# Patient Record
Sex: Male | Born: 1950
Health system: Southern US, Community
[De-identification: ages and names within clinical notes are randomized; demographics above are authoritative.]

## PROBLEM LIST (undated history)

## (undated) DIAGNOSIS — Z9289 Personal history of other medical treatment: Secondary | ICD-10-CM

## (undated) DIAGNOSIS — E785 Hyperlipidemia, unspecified: Secondary | ICD-10-CM

## (undated) DIAGNOSIS — IMO0002 Reserved for concepts with insufficient information to code with codable children: Secondary | ICD-10-CM

## (undated) DIAGNOSIS — I10 Essential (primary) hypertension: Secondary | ICD-10-CM

## (undated) DIAGNOSIS — H269 Unspecified cataract: Secondary | ICD-10-CM

## (undated) DIAGNOSIS — I2510 Atherosclerotic heart disease of native coronary artery without angina pectoris: Secondary | ICD-10-CM

## (undated) DIAGNOSIS — I639 Cerebral infarction, unspecified: Secondary | ICD-10-CM

## (undated) DIAGNOSIS — I9789 Other postprocedural complications and disorders of the circulatory system, not elsewhere classified: Secondary | ICD-10-CM

## (undated) DIAGNOSIS — T7840XA Allergy, unspecified, initial encounter: Secondary | ICD-10-CM

## (undated) DIAGNOSIS — E119 Type 2 diabetes mellitus without complications: Secondary | ICD-10-CM

## (undated) DIAGNOSIS — K635 Polyp of colon: Secondary | ICD-10-CM

## (undated) DIAGNOSIS — I4891 Unspecified atrial fibrillation: Secondary | ICD-10-CM

## (undated) DIAGNOSIS — I6529 Occlusion and stenosis of unspecified carotid artery: Secondary | ICD-10-CM

## (undated) HISTORY — DX: Unspecified cataract: H26.9

## (undated) HISTORY — DX: Essential (primary) hypertension: I10

## (undated) HISTORY — DX: Unspecified atrial fibrillation: I48.91

## (undated) HISTORY — DX: Hyperlipidemia, unspecified: E78.5

## (undated) HISTORY — PX: EYE SURGERY: SHX253

## (undated) HISTORY — DX: Cerebral infarction, unspecified: I63.9

## (undated) HISTORY — DX: Type 2 diabetes mellitus without complications: E11.9

## (undated) HISTORY — DX: Personal history of other medical treatment: Z92.89

## (undated) HISTORY — PX: COLONOSCOPY: SHX174

## (undated) HISTORY — PX: TONSILLECTOMY: SUR1361

## (undated) HISTORY — PX: SPINE SURGERY: SHX786

## (undated) HISTORY — DX: Atherosclerotic heart disease of native coronary artery without angina pectoris: I25.10

## (undated) HISTORY — DX: Allergy, unspecified, initial encounter: T78.40XA

## (undated) HISTORY — DX: Occlusion and stenosis of unspecified carotid artery: I65.29

## (undated) HISTORY — DX: Other postprocedural complications and disorders of the circulatory system, not elsewhere classified: I97.89

## (undated) HISTORY — DX: Reserved for concepts with insufficient information to code with codable children: IMO0002

## (undated) HISTORY — DX: Polyp of colon: K63.5

---

## 2000-01-12 ENCOUNTER — Ambulatory Visit (HOSPITAL_COMMUNITY): Admission: RE | Admit: 2000-01-12 | Discharge: 2000-01-12 | Payer: Self-pay | Admitting: Cardiology

## 2000-01-20 ENCOUNTER — Ambulatory Visit (HOSPITAL_COMMUNITY): Admission: RE | Admit: 2000-01-20 | Discharge: 2000-01-21 | Payer: Self-pay | Admitting: Cardiology

## 2000-02-03 ENCOUNTER — Encounter (HOSPITAL_COMMUNITY): Admission: RE | Admit: 2000-02-03 | Discharge: 2000-05-03 | Payer: Self-pay | Admitting: Cardiology

## 2000-02-09 ENCOUNTER — Encounter: Admission: RE | Admit: 2000-02-09 | Discharge: 2000-05-09 | Payer: Self-pay | Admitting: Cardiology

## 2000-05-04 ENCOUNTER — Encounter (HOSPITAL_COMMUNITY): Admission: RE | Admit: 2000-05-04 | Discharge: 2000-05-11 | Payer: Self-pay | Admitting: Cardiology

## 2000-05-12 ENCOUNTER — Encounter (HOSPITAL_COMMUNITY): Admission: RE | Admit: 2000-05-12 | Discharge: 2000-08-10 | Payer: Self-pay | Admitting: Cardiology

## 2000-08-11 ENCOUNTER — Encounter (HOSPITAL_COMMUNITY): Admission: RE | Admit: 2000-08-11 | Discharge: 2000-10-12 | Payer: Self-pay | Admitting: Cardiology

## 2001-11-01 ENCOUNTER — Ambulatory Visit (HOSPITAL_COMMUNITY): Admission: RE | Admit: 2001-11-01 | Discharge: 2001-11-01 | Payer: Self-pay | Admitting: Pulmonary Disease

## 2001-11-29 ENCOUNTER — Encounter: Payer: Self-pay | Admitting: Neurosurgery

## 2001-12-01 ENCOUNTER — Ambulatory Visit (HOSPITAL_COMMUNITY): Admission: RE | Admit: 2001-12-01 | Discharge: 2001-12-02 | Payer: Self-pay | Admitting: Neurosurgery

## 2001-12-01 ENCOUNTER — Encounter: Payer: Self-pay | Admitting: Neurosurgery

## 2002-08-08 ENCOUNTER — Encounter: Payer: Self-pay | Admitting: Emergency Medicine

## 2002-08-08 ENCOUNTER — Encounter: Payer: Self-pay | Admitting: General Surgery

## 2002-08-08 ENCOUNTER — Inpatient Hospital Stay (HOSPITAL_COMMUNITY): Admission: EM | Admit: 2002-08-08 | Discharge: 2002-08-09 | Payer: Self-pay | Admitting: Emergency Medicine

## 2003-01-16 ENCOUNTER — Ambulatory Visit (HOSPITAL_COMMUNITY): Admission: RE | Admit: 2003-01-16 | Discharge: 2003-01-17 | Payer: Self-pay | Admitting: Cardiology

## 2003-01-16 ENCOUNTER — Encounter: Payer: Self-pay | Admitting: Cardiology

## 2003-02-05 ENCOUNTER — Encounter (HOSPITAL_COMMUNITY): Admission: RE | Admit: 2003-02-05 | Discharge: 2003-05-06 | Payer: Self-pay | Admitting: Cardiology

## 2003-04-07 HISTORY — PX: APPENDECTOMY: SHX54

## 2003-04-16 ENCOUNTER — Ambulatory Visit (HOSPITAL_COMMUNITY): Admission: RE | Admit: 2003-04-16 | Discharge: 2003-04-17 | Payer: Self-pay | Admitting: Cardiology

## 2003-05-07 ENCOUNTER — Encounter (HOSPITAL_COMMUNITY): Admission: RE | Admit: 2003-05-07 | Discharge: 2003-08-05 | Payer: Self-pay | Admitting: Cardiology

## 2004-01-22 ENCOUNTER — Ambulatory Visit: Payer: Self-pay | Admitting: Cardiology

## 2004-01-23 ENCOUNTER — Inpatient Hospital Stay (HOSPITAL_BASED_OUTPATIENT_CLINIC_OR_DEPARTMENT_OTHER): Admission: RE | Admit: 2004-01-23 | Discharge: 2004-01-23 | Payer: Self-pay | Admitting: Cardiology

## 2004-01-29 ENCOUNTER — Ambulatory Visit (HOSPITAL_COMMUNITY): Admission: RE | Admit: 2004-01-29 | Discharge: 2004-01-30 | Payer: Self-pay | Admitting: Cardiology

## 2004-02-11 ENCOUNTER — Ambulatory Visit: Payer: Self-pay | Admitting: Neurology

## 2004-04-06 DIAGNOSIS — K635 Polyp of colon: Secondary | ICD-10-CM

## 2004-04-06 HISTORY — PX: BACK SURGERY: SHX140

## 2004-04-06 HISTORY — DX: Polyp of colon: K63.5

## 2004-10-28 ENCOUNTER — Ambulatory Visit: Payer: Self-pay | Admitting: Gastroenterology

## 2004-11-21 ENCOUNTER — Ambulatory Visit: Payer: Self-pay | Admitting: Gastroenterology

## 2004-11-28 ENCOUNTER — Ambulatory Visit: Payer: Self-pay | Admitting: Gastroenterology

## 2004-12-03 ENCOUNTER — Emergency Department (HOSPITAL_COMMUNITY): Admission: EM | Admit: 2004-12-03 | Discharge: 2004-12-03 | Payer: Self-pay | Admitting: Emergency Medicine

## 2005-04-09 ENCOUNTER — Ambulatory Visit: Payer: Self-pay | Admitting: Cardiology

## 2005-04-09 ENCOUNTER — Ambulatory Visit: Payer: Self-pay

## 2005-04-17 ENCOUNTER — Ambulatory Visit: Payer: Self-pay | Admitting: Cardiology

## 2005-05-27 ENCOUNTER — Ambulatory Visit: Payer: Self-pay | Admitting: Cardiology

## 2005-06-01 ENCOUNTER — Inpatient Hospital Stay (HOSPITAL_COMMUNITY): Admission: AD | Admit: 2005-06-01 | Discharge: 2005-06-03 | Payer: Self-pay | Admitting: Cardiology

## 2005-06-01 ENCOUNTER — Inpatient Hospital Stay (HOSPITAL_BASED_OUTPATIENT_CLINIC_OR_DEPARTMENT_OTHER): Admission: RE | Admit: 2005-06-01 | Discharge: 2005-06-01 | Payer: Self-pay | Admitting: Cardiology

## 2005-06-01 ENCOUNTER — Ambulatory Visit: Payer: Self-pay | Admitting: Cardiology

## 2005-06-17 ENCOUNTER — Ambulatory Visit: Payer: Self-pay | Admitting: Cardiology

## 2005-07-21 ENCOUNTER — Ambulatory Visit: Payer: Self-pay | Admitting: Cardiology

## 2005-07-28 ENCOUNTER — Ambulatory Visit: Payer: Self-pay | Admitting: Cardiology

## 2005-08-27 ENCOUNTER — Ambulatory Visit: Payer: Self-pay | Admitting: Cardiology

## 2006-04-19 ENCOUNTER — Ambulatory Visit: Payer: Self-pay | Admitting: Cardiology

## 2006-05-31 ENCOUNTER — Ambulatory Visit: Payer: Self-pay | Admitting: Cardiology

## 2006-09-20 ENCOUNTER — Ambulatory Visit: Payer: Self-pay | Admitting: Cardiology

## 2007-01-07 ENCOUNTER — Ambulatory Visit: Payer: Self-pay | Admitting: Cardiology

## 2007-01-12 ENCOUNTER — Ambulatory Visit: Payer: Self-pay | Admitting: Cardiology

## 2007-01-12 LAB — CONVERTED CEMR LAB
AST: 25 units/L (ref 0–37)
Albumin: 4 g/dL (ref 3.5–5.2)
Bilirubin, Direct: 0.1 mg/dL (ref 0.0–0.3)
CO2: 27 meq/L (ref 19–32)
Chloride: 105 meq/L (ref 96–112)
Cholesterol: 103 mg/dL (ref 0–200)
Creatinine, Ser: 1.3 mg/dL (ref 0.4–1.5)
Direct LDL: 42.6 mg/dL
Glucose, Bld: 221 mg/dL — ABNORMAL HIGH (ref 70–99)
HDL: 36.1 mg/dL — ABNORMAL LOW (ref >39.0)
Hgb A1c MFr Bld: 9.1 % — ABNORMAL HIGH (ref 4.6–6.0)
Potassium: 4.5 meq/L (ref 3.5–5.1)
Sodium: 140 meq/L (ref 135–145)
Total Bilirubin: 0.8 mg/dL (ref 0.3–1.2)
Total Protein: 6.3 g/dL (ref 6.0–8.3)

## 2007-04-07 DIAGNOSIS — I4891 Unspecified atrial fibrillation: Secondary | ICD-10-CM

## 2007-04-07 DIAGNOSIS — I9789 Other postprocedural complications and disorders of the circulatory system, not elsewhere classified: Secondary | ICD-10-CM

## 2007-04-07 HISTORY — PX: CORONARY ARTERY BYPASS GRAFT: SHX141

## 2007-04-07 HISTORY — DX: Unspecified atrial fibrillation: I97.89

## 2007-04-07 HISTORY — PX: CARDIAC CATHETERIZATION: SHX172

## 2007-04-07 HISTORY — DX: Unspecified atrial fibrillation: I48.91

## 2007-06-27 ENCOUNTER — Encounter (HOSPITAL_BASED_OUTPATIENT_CLINIC_OR_DEPARTMENT_OTHER): Admission: RE | Admit: 2007-06-27 | Discharge: 2007-09-01 | Payer: Self-pay | Admitting: Surgery

## 2007-06-28 ENCOUNTER — Ambulatory Visit (HOSPITAL_COMMUNITY): Admission: RE | Admit: 2007-06-28 | Discharge: 2007-06-28 | Payer: Self-pay | Admitting: Surgery

## 2007-08-31 ENCOUNTER — Ambulatory Visit: Payer: Self-pay | Admitting: Cardiology

## 2007-08-31 LAB — CONVERTED CEMR LAB
BUN: 16 mg/dL (ref 6–23)
Basophils Absolute: 0.1 10*3/uL (ref 0.0–0.1)
Creatinine, Ser: 1.3 mg/dL (ref 0.4–1.5)
Eosinophils Absolute: 0.3 10*3/uL (ref 0.0–0.7)
Eosinophils Relative: 4 % (ref 0.0–5.0)
GFR calc Af Amer: 74 mL/min
GFR calc non Af Amer: 61 mL/min
HCT: 45.2 % (ref 39.0–52.0)
MCHC: 34 g/dL (ref 30.0–36.0)
MCV: 85.8 fL (ref 78.0–100.0)
Monocytes Absolute: 0.8 10*3/uL (ref 0.1–1.0)
Platelets: 238 10*3/uL (ref 150–400)
Potassium: 4.2 meq/L (ref 3.5–5.1)
WBC: 6.7 10*3/uL (ref 4.5–10.5)
aPTT: 26.8 s (ref 21.7–29.8)

## 2007-09-02 ENCOUNTER — Ambulatory Visit: Payer: Self-pay | Admitting: Cardiology

## 2007-09-02 ENCOUNTER — Inpatient Hospital Stay (HOSPITAL_BASED_OUTPATIENT_CLINIC_OR_DEPARTMENT_OTHER): Admission: RE | Admit: 2007-09-02 | Discharge: 2007-09-02 | Payer: Self-pay | Admitting: Cardiology

## 2007-09-06 ENCOUNTER — Ambulatory Visit: Payer: Self-pay | Admitting: Surgery

## 2007-09-06 ENCOUNTER — Encounter: Admission: RE | Admit: 2007-09-06 | Discharge: 2007-09-06 | Payer: Self-pay | Admitting: Surgery

## 2007-09-06 ENCOUNTER — Encounter: Payer: Self-pay | Admitting: Surgery

## 2007-09-06 ENCOUNTER — Ambulatory Visit (HOSPITAL_COMMUNITY): Admission: RE | Admit: 2007-09-06 | Discharge: 2007-09-06 | Payer: Self-pay | Admitting: Surgery

## 2007-09-12 ENCOUNTER — Inpatient Hospital Stay (HOSPITAL_COMMUNITY): Admission: RE | Admit: 2007-09-12 | Discharge: 2007-09-17 | Payer: Self-pay | Admitting: Surgery

## 2007-09-12 ENCOUNTER — Ambulatory Visit: Payer: Self-pay | Admitting: Surgery

## 2007-09-29 ENCOUNTER — Ambulatory Visit: Payer: Self-pay | Admitting: Cardiology

## 2007-09-29 LAB — CONVERTED CEMR LAB
Basophils Absolute: 0 10*3/uL (ref 0.0–0.1)
CO2: 22 meq/L (ref 19–32)
Chloride: 103 meq/L (ref 96–112)
Lymphocytes Relative: 19.8 % (ref 12.0–46.0)
MCHC: 32.8 g/dL (ref 30.0–36.0)
Monocytes Relative: 7 % (ref 3.0–12.0)
Neutrophils Relative %: 66.4 % (ref 43.0–77.0)
Potassium: 4.1 meq/L (ref 3.5–5.1)
RBC: 4.45 M/uL (ref 4.22–5.81)
RDW: 15.2 % — ABNORMAL HIGH (ref 11.5–14.6)
Sodium: 138 meq/L (ref 135–145)

## 2007-10-04 ENCOUNTER — Encounter: Admission: RE | Admit: 2007-10-04 | Discharge: 2007-10-04 | Payer: Self-pay | Admitting: Surgery

## 2007-10-06 ENCOUNTER — Encounter (HOSPITAL_COMMUNITY): Admission: RE | Admit: 2007-10-06 | Discharge: 2008-01-04 | Payer: Self-pay | Admitting: Cardiology

## 2007-10-11 ENCOUNTER — Ambulatory Visit: Payer: Self-pay | Admitting: Surgery

## 2007-11-04 ENCOUNTER — Ambulatory Visit: Payer: Self-pay | Admitting: Cardiology

## 2007-11-10 ENCOUNTER — Ambulatory Visit: Payer: Self-pay | Admitting: Cardiology

## 2007-11-10 LAB — CONVERTED CEMR LAB
ALT: 21 units/L (ref 0–53)
CO2: 27 meq/L (ref 19–32)
Calcium: 9.8 mg/dL (ref 8.4–10.5)
Cholesterol: 117 mg/dL (ref 0–200)
Creatinine, Ser: 1.3 mg/dL (ref 0.4–1.5)
Direct LDL: 52 mg/dL
GFR calc Af Amer: 74 mL/min
GFR calc non Af Amer: 61 mL/min
HDL: 38.7 mg/dL — ABNORMAL LOW (ref >39.0)
Hemoglobin: 14.5 g/dL (ref 13.0–17.0)
Total Bilirubin: 0.8 mg/dL (ref 0.3–1.2)
Total CHOL/HDL Ratio: 3
Triglycerides: 233 mg/dL (ref 0–149)
VLDL: 47 mg/dL — ABNORMAL HIGH (ref 0–40)

## 2008-01-05 ENCOUNTER — Encounter (HOSPITAL_COMMUNITY): Admission: RE | Admit: 2008-01-05 | Discharge: 2008-03-15 | Payer: Self-pay | Admitting: Cardiology

## 2008-06-03 DIAGNOSIS — Z794 Long term (current) use of insulin: Secondary | ICD-10-CM

## 2008-06-03 DIAGNOSIS — I2510 Atherosclerotic heart disease of native coronary artery without angina pectoris: Secondary | ICD-10-CM

## 2008-06-03 DIAGNOSIS — I10 Essential (primary) hypertension: Secondary | ICD-10-CM

## 2008-06-03 DIAGNOSIS — E785 Hyperlipidemia, unspecified: Secondary | ICD-10-CM | POA: Insufficient documentation

## 2008-06-03 DIAGNOSIS — I25119 Atherosclerotic heart disease of native coronary artery with unspecified angina pectoris: Secondary | ICD-10-CM | POA: Insufficient documentation

## 2008-06-03 DIAGNOSIS — E118 Type 2 diabetes mellitus with unspecified complications: Secondary | ICD-10-CM

## 2008-06-03 HISTORY — DX: Long term (current) use of insulin: Z79.4

## 2008-06-03 HISTORY — DX: Essential (primary) hypertension: I10

## 2008-06-03 HISTORY — DX: Long term (current) use of insulin: E11.8

## 2008-06-03 HISTORY — DX: Hyperlipidemia, unspecified: E78.5

## 2008-06-03 HISTORY — DX: Atherosclerotic heart disease of native coronary artery without angina pectoris: I25.10

## 2008-06-04 ENCOUNTER — Ambulatory Visit: Payer: Self-pay | Admitting: Cardiology

## 2008-10-16 ENCOUNTER — Telehealth: Payer: Self-pay | Admitting: Cardiology

## 2008-12-20 ENCOUNTER — Telehealth: Payer: Self-pay | Admitting: Cardiology

## 2008-12-27 ENCOUNTER — Telehealth: Payer: Self-pay | Admitting: Cardiology

## 2008-12-28 ENCOUNTER — Encounter: Payer: Self-pay | Admitting: Cardiology

## 2008-12-31 ENCOUNTER — Telehealth: Payer: Self-pay | Admitting: Cardiology

## 2009-01-11 ENCOUNTER — Telehealth: Payer: Self-pay | Admitting: Cardiology

## 2009-06-25 ENCOUNTER — Ambulatory Visit: Payer: Self-pay | Admitting: Cardiology

## 2009-07-12 ENCOUNTER — Telehealth: Payer: Self-pay | Admitting: Cardiology

## 2009-07-19 ENCOUNTER — Telehealth: Payer: Self-pay | Admitting: Cardiology

## 2009-08-07 ENCOUNTER — Telehealth: Payer: Self-pay | Admitting: Cardiology

## 2009-08-29 ENCOUNTER — Encounter: Payer: Self-pay | Admitting: Cardiology

## 2009-10-23 ENCOUNTER — Encounter (INDEPENDENT_AMBULATORY_CARE_PROVIDER_SITE_OTHER): Payer: Self-pay | Admitting: *Deleted

## 2010-05-06 NOTE — Progress Notes (Signed)
Summary: refill**[facility]**done DAJ  Phone Note Refill Request   Refills Requested: Medication #1:  FUROSEMIDE 40 MG TABS 1 tab once daily   Supply Requested: 3 months [facility]   Method Requested: Fax to [facility] Initial call taken by: Migdalia Dk,  July 19, 2009 10:50 AM  Follow-up for Phone Call        REFILL done script printed and faxed in` DAJ Follow-up by: [name], CNA,  July 19, 2009 11:31 AM    Prescriptions: FUROSEMIDE 40 MG TABS (FUROSEMIDE) 1 tab once daily  #90 x 3   Entered by:   [name], CNA   Authorized by:   Lenoria Farrier, MD, [facility]   Signed by:   [name], CNA on 07/19/2009   Method used:   Faxed to ...       [facility]mail order pharmacy (mail-order)             , [place]         Ph: [phone]       Fax: [phone]   RxID:   [id number] FUROSEMIDE 40 MG TABS (FUROSEMIDE) 1 tab once daily  #90 x 3   Entered by:   [name], CNA   Authorized by:   Lenoria Farrier, MD, [facility]   Signed by:   [name], CNA on 07/19/2009   Method used:   [facility]  [address] [phone][facility] (retail[facility]       [address]       [place], [place]  [address]       Ph: [phone] or [phone]       Fax: [phone]   RxID:   [id number]

## 2010-05-06 NOTE — Assessment & Plan Note (Signed)
Summary: 1 YR F/U   Visit Type:  Follow-up Primary Provider:  dr Kari Baars  CC:  no complaints.  History of Present Illness: [name] is 60 years old returned for followup management of CAD. He has had multiple PCI procedures and in 2009 had bypass surgery. He has normal LV function. He had atrial fibrillation early postoperative period following his surgery but has not headache fibrillation before or since. He had done extremely well since his bypass surgery. He's been quite active and is able to hunt and walk without getting short of breath like he did before his surgery. He works as a Psychologist, counselling and previously had been doing mostly administrative tasks but is now more involved than the actual work and his stamina is holding up well.  His other problems include hypertension hyperlipidemia and diabetes which is managed by Dr. Chestine Spore.  Since his surgery he is unable to be more active and has lost from 253 pounds down to 242 pounds.  Current Medications (verified): 1)  Altace 10 Mg Caps (Ramipril) .[name].. 1 Tab Once Daily 2)  Plavix 75 Mg Tabs (Clopidogrel Bisulfate) .[name].. 1 Tab Once Daily 3)  Nitrostat 0.4 Mg Subl (Nitroglycerin) .... Take As Directed 4)  Metoprolol Tartrate 25 Mg Tabs (Metoprolol Tartrate) .... Take One Tablet By Mouth Twice A Day 5)  Victoza 18 Mg/90ml Soln (Liraglutide) .[name].. 1 Injection For  Dm Once Daily 6)  Aspirin Ec 325 Mg Tbec (Aspirin) .... Take One Tablet By Mouth Daily 7)  Crestor 20 Mg Tabs (Rosuvastatin Calcium) .... Take One Tablet By Mouth Daily. 8)  Metformin Hcl 1000 Mg Tabs (Metformin Hcl) .[name].. 1 Tab Two Times A Day 9)  Furosemide 40 Mg Tabs (Furosemide) .[name].. 1 Tab Once Daily 10)  Amitriptyline Hcl 75 Mg Tabs (Amitriptyline Hcl) .[name].. 1 Tab  Once Daily  Allergies (verified): 1)  Amoxicillin (Amoxicillin) 2)  Amoxicillin (Amoxicillin)  Past History:  Past Medical History: Reviewed history from 06/03/2008 and no changes required. 1. Coronary artery  disease status post coronary bypass graft surgery,     September 12, 2007, stable. 2. Good left ventricular function. 3. Insulin-dependent diabetes. 4. Hypertension. 5. Hyperlipidemia. 6. Postoperative atrial fibrillation, now off amiodarone.   Review of Systems       ROS is negative except as outlined in HPI.   Vital Signs:  Patient profile:   60 year old male Height:      70 inches Weight:      242 pounds BMI:     34.85 Pulse rate:   84 / minute BP sitting:   126 / 80  (left arm) Cuff size:   large  Vitals Entered By: [name], CNA (June 25, 2009 3:17 PM)  Physical Exam  Additional Exam:  Gen. Well-nourished, in no distress   Neck: No JVD, thyroid not enlarged, no carotid bruits Lungs: No tachypnea, clear without rales, rhonchi or wheezes Cardiovascular: Rhythm regular, PMI not displaced,  heart sounds  normal, no murmurs or gallops, no peripheral edema, pulses normal in all 4 extremities. Abdomen: BS normal, abdomen soft and non-tender without masses or organomegaly, no hepatosplenomegaly. MS: No deformities, no cyanosis or clubbing   Neuro:  No focal sns   Skin:  no lesions    Impression & Recommendations:  Problem # 1:  CAD, AUTOLOGOUS BYPASS GRAFT (ICD-414.02) He is post bypass surgery in 2009. He's having no chest pain and doing very well. This problem is stable. His updated medication list for this problem includes:  Altace 10 Mg Caps (Ramipril) .[name]... 1 tab once daily    Plavix 75 Mg Tabs (Clopidogrel bisulfate) .[name]... 1 tab once daily    Nitrostat 0.4 Mg Subl (Nitroglycerin) .[name]... Take as directed    Metoprolol Tartrate 25 Mg Tabs (Metoprolol tartrate) .[name]... Take one tablet by mouth twice a day    Aspirin Ec 325 Mg Tbec (Aspirin) .[name]... Take one tablet by mouth daily  Orders: EKG w/ Interpretation (93[address])  Problem # 2:  HYPERTENSION, BENIGN (ICD-401.1) This is well controlled on current medications. His updated medication list for this problem includes:     Altace 10 Mg Caps (Ramipril) .[name]... 1 tab once daily    Metoprolol Tartrate 25 Mg Tabs (Metoprolol tartrate) .[name]... Take one tablet by mouth twice a day    Aspirin Ec 325 Mg Tbec (Aspirin) .[name]... Take one tablet by mouth daily    Furosemide 40 Mg Tabs (Furosemide) .[name]... 1 tab once daily  Orders: EKG w/ Interpretation (93000)  Problem # 3:  HYPERLIPIDEMIA-MIXED (ICD-272.4) We will plan to get a followup lipid and liver profile with his next laboratory studies with Dr. Chestine Spore. His updated medication list for this problem includes:    Crestor 20 Mg Tabs (Rosuvastatin calcium) .[name]... Take one tablet by mouth daily.  Orders: EKG w/ Interpretation ([address])  Patient Instructions: 1)  Your physician wants you to follow-up in: 1 year with Dr. Earlean Shawl will receive a reminder letter in the mail two months in advance. If you don't receive a letter, please call our office to schedule the follow-up appointment. 2)  You have been given a lab order to have FASTING labs at Dr. Ophelia Charter office.

## 2010-05-06 NOTE — Progress Notes (Signed)
Summary: refill**[facility] Pharmacy** done daj  Phone Note Refill Request Call back at Work Phone [phone] Call back at [phone] Message from:  Patient on July 12, 2009 10:24 AM  Refills Requested: Medication #1:  Zetia 10mg  1 tab daily   Supply Requested: 3 months  Medication #2:  ALTACE 10 MG CAPS 1 tab once daily   Supply Requested: 3 months  Medication #3:  PLAVIX 75 MG TABS 1 tab once daily   Supply Requested: 3 months  Medication #4:  CRESTOR 20 MG TABS Take one tablet by mouth daily.   Supply Requested: 3 months FUROSEMIDE 40 MG TABS (FUROSEMIDE) 1 tab once daily [facility] [phone] ID # [id number]   Method Requested: Fax to [facility] Initial call taken by: Migdalia Dk,  July 12, 2009 10:26 AM    New/Updated Medications: ZETIA 10 MG TABS (EZETIMIBE) 1 tab qd Prescriptions: CRESTOR 20 MG TABS (ROSUVASTATIN CALCIUM) Take one tablet by mouth daily.  #90 x 3   Entered by:   [name], CNA   Authorized by:   Lenoria Farrier, MD, [facility]   Signed by:   [name], CNA on 07/15/2009   Method used:   Faxed to ...       [facility]--mail order pharmacy (mail-order)             , [place]         Ph: [phone]       Fax: [phone]   RxID:   [id number] ZETIA 10 MG TABS (EZETIMIBE) 1 tab qd  #90 x 3   Entered by:   [name], CNA   Authorized by:   Lenoria Farrier, MD, [facility]   Signed by:   [name], CNA on 07/15/2009   Method used:   Faxed to ...       [facility]mail order pharmacy (mail-order)             , [place]         Ph: [phone]       Fax: [phone]   RxID:   [id number] PLAVIX 75 MG TABS (CLOPIDOGREL BISULFATE) 1 tab once daily  #90 x 3   Entered by:   [name], CNA   Authorized by:   Lenoria Farrier, MD, [facility]   Signed by:   [name], CNA on 07/15/2009   Method used:   Faxed to ...       [facility]mail order pharmacy (mail-order)             , [place]         Ph: [phone]       Fax:  [phone]   RxID:   [id number] ALTACE 10 MG CAPS (RAMIPRIL) 1 tab once daily  #90 x 3   Entered by:   [name], CNA   Authorized by:   Lenoria Farrier, MD, [facility]   Signed by:   [name], CNA on 07/15/2009   Method used:   Faxed to ...       [facility]mail order pharmacy (mail-order)             , [place]         Ph: [phone]       Fax: [phone]   RxID:   [id number]

## 2010-05-06 NOTE — Letter (Signed)
Summary: Colonoscopy-Changed to Office Visit Letter  [facility]  [address] [place], [place] [address]   Phone: [phone]  Fax: [phone]      October 23, 2009 MRN: [id number]   [name] [address] [place], [place]  [address]   Dear [name],   According to our records, it is time for you to schedule a Colonoscopy. However, after reviewing your medical record, I feel that an office visit would be most appropriate to more completely evaluate you and determine your need for a repeat procedure.  Please call [phone] (option #2) at your convenience to schedule an office visit. If you have any questions, concerns, or feel that this letter is in error, we would appreciate your call.   Sincerely,    Vania Rea. Jarold Motto, M.D.  [facility] [phone]

## 2010-05-06 NOTE — Progress Notes (Signed)
Summary: f/u labs [facility])  ---- Converted from flag ---- ---- 07/02/2009 2:27 PM, Sherri Rad, RN, BSN wrote: pt was to have labs at Dr. Ophelia Charter office- order given on 06/25/09 ------------------------------  Phone Note Outgoing Call   Call placed by: Sherri Rad, RN, BSN,  Aug 07, 2009 2:02 PM Call placed to: Patient Summary of Call: I left a message for the pt to call. I wanted to see if he had his labs drawn at Dr. Ophelia Charter office and to clarify which Dr. Chestine Spore he sees. Sherri Rad, RN, BSN  Aug 07, 2009 2:03 PM   Follow-up for Phone Call        Pt returning call for lab results call back at [phone] [facility]  Aug 09, 2009 3:46 PM Spoke with pt. Pt. states he got a message from his daughter to call the doctor's office. I let pt. know  it is not a call made to him since 07/19/09 which he got. Pt states very pleasantly." I am not worry about ny thing". Follow-up by: Ollen Gross, RN, BSN,  Aug 12, 2009 9:21 AM

## 2010-05-16 ENCOUNTER — Telehealth (INDEPENDENT_AMBULATORY_CARE_PROVIDER_SITE_OTHER): Payer: Self-pay | Admitting: *Deleted

## 2010-05-22 NOTE — Progress Notes (Signed)
  Phone Note Outgoing Call Call back at [facility] Phone [phone]   Call placed by: Celestia Khat, CMA,  May 16, 2010 3:28 PM Summary of Call: left message for pt to call me back re previous phone message left by the patient.  I accidentally discarded earlier phone note, so I informed pt to please call back I will be glad to assist. Celestia Khat, CMA  May 16, 2010 3:31 PM

## 2010-05-27 ENCOUNTER — Telehealth: Payer: Self-pay | Admitting: Cardiology

## 2010-06-03 NOTE — Progress Notes (Signed)
Summary: REFILL  Phone Note Refill Request Message from:  Patient on May 27, 2010 9:17 AM  Refills Requested: Medication #1:  METOPROLOL TARTRATE 25 MG TABS Take one tablet by mouth twice a day [facility] [phone]  Initial call taken by: Judie Grieve,  May 27, 2010 9:18 AM    Prescriptions: METOPROLOL TARTRATE 25 MG TABS (METOPROLOL TARTRATE) Take one tablet by mouth twice a day  #180 x 3   Entered by:   [name], CNA   Authorized by:   Marca Ancona, MD   Signed by:   [name], CNA on 05/28/2010   Method used:   Faxed to ...       [facility]--mail order pharmacy (mail-order)             , [place]         Ph: [phone]       Fax: [phone]   RxID:   [id number] METOPROLOL TARTRATE 25 MG TABS (METOPROLOL TARTRATE) Take one tablet by mouth twice a day  #180 x 3   Entered by:   [name], CNA   Authorized by:   Marca Ancona, MD   Signed by:   [name], CNA on 05/28/2010   Method used:   [facility]        [facility]  [address] [phone]* (retail)       [address]       [place], [place]  [address]       Ph: [phone] or [phone]       Fax: [phone]   RxID:   [id number]  [facility] PT WANTS MAIL ORDER

## 2010-06-09 ENCOUNTER — Encounter: Payer: Self-pay | Admitting: Cardiology

## 2010-06-10 ENCOUNTER — Ambulatory Visit (INDEPENDENT_AMBULATORY_CARE_PROVIDER_SITE_OTHER): Payer: PRIVATE HEALTH INSURANCE | Admitting: Cardiology

## 2010-06-10 ENCOUNTER — Encounter: Payer: Self-pay | Admitting: Cardiology

## 2010-06-10 DIAGNOSIS — E7800 Pure hypercholesterolemia, unspecified: Secondary | ICD-10-CM

## 2010-06-10 DIAGNOSIS — I25810 Atherosclerosis of coronary artery bypass graft(s) without angina pectoris: Secondary | ICD-10-CM

## 2010-06-17 NOTE — Assessment & Plan Note (Signed)
Summary: F1Y/PER CHECKOUT 06/25/09/FORMER [facility] PT/HM/AMD   Primary Provider:  Dr. Juanetta Gosling   History of Present Illness: 60 yo with history of CAD s/p CABG in 2009 as well as HTN, DM, and hyperlipidemia presents for followup.  I am seeing him for the first time today (was seen by Dr. Juanda Chance in the past).  He has been doing well symptomatically.  He will get occasional episodes of chest burning that last for about 30 seconds at a time.  These episodes are not exertional.  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  No exercise limitations.  He has never used NTG since his bypass.  His diabetes control is improving.  BP is under good control.  Weight is down 3 lbs since last appointment.   ECG: NSR, normal  Labs (5/11): LDL 21, HDL 38, TGs 339, K 4.1, creatinine 1.2  Current Medications (verified): 1)  Altace 10 Mg Caps (Ramipril) .[name].. 1 Tab Once Daily 2)  Plavix 75 Mg Tabs (Clopidogrel Bisulfate) .[name].. 1 Tab Once Daily 3)  Nitrostat 0.4 Mg Subl (Nitroglycerin) .... Take As Directed 4)  Metoprolol Tartrate 25 Mg Tabs (Metoprolol Tartrate) .... Take One Tablet By Mouth Twice A Day 5)  Aspirin Ec 325 Mg Tbec (Aspirin) .... Take One Tablet By Mouth Daily 6)  Crestor 20 Mg Tabs (Rosuvastatin Calcium) .... Take One Tablet By Mouth Daily. 7)  Metformin Hcl 1000 Mg Tabs (Metformin Hcl) .[name].. 1 Tab Two Times A Day 8)  Furosemide 40 Mg Tabs (Furosemide) .[name].. 1 Tab Once Daily 9)  Amitriptyline Hcl 75 Mg Tabs (Amitriptyline Hcl) .[name].. 1 Tab  Once Daily 10)  Zetia 10 Mg Tabs (Ezetimibe) .[name].. 1 Tab Qd 11)  Fish Oil 300 Mg Caps (Omega-3 Fatty Acids) .... 600mg  Daily 12)  Humalog Mix 50/50 Kwikpen 50-50 % Susp (Insulin Lispro Prot & Lispro) .... Take As Directed 13)  Humalog Mix 75/25 Kwikpen 75-25 % Susp (Insulin Lispro Prot & Lispro) .... Take As Directed  Allergies: 1)  Amoxicillin (Amoxicillin) 2)  Amoxicillin (Amoxicillin)  Past  History:  Past Medical History: 1.  CAD: Multiple PCI procedures followed by CABG in 2009 with LIMA-LAD, SVG-D, seq SVG-OM and PLV, SVG-PDA.  2. EF 60% by cath in 2009 3. Insulin-dependent diabetes: Followed by Dr. Chestine Spore. 4. Hypertension. 5. Hyperlipidemia. 6. Postoperative atrial fibrillation after CABG in 2009  Family History: Very strongly positive for coronary disease.  2 brothers with CABG.  Multiple other family members had heart disease.      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  He is married and lives with his wife.  No children.  He has never smoked, but used to chew some tobacco.  He denies alcohol abuse.  Originally from [place].      Review of Systems       All systems reviewed and negative except as per HPI.   Vital Signs:  Patient profile:   60 year old male Height:      70 inches Weight:      239 pounds Pulse rate:   70 / minute BP sitting:   116 / 76  (left arm) Cuff size:   large  Vitals Entered By: Katina Dung, RN, BSN (June 10, 2010 11:56 AM)  Physical Exam  General:  Well developed, well nourished, in no acute distress.  Overweight.  Neck:  Neck supple, no JVD. No  masses, thyromegaly or abnormal cervical nodes. Lungs:  Clear bilaterally to auscultation and percussion. Heart:  Non-displaced PMI, chest non-tender; regular rate and rhythm, S1, S2 without murmurs, rubs or gallops. Carotid upstroke normal, no bruit.  Pedals normal pulses. No edema, no varicosities. Abdomen:  Bowel sounds positive; abdomen soft and non-tender without masses, organomegaly, or hernias noted. No hepatosplenomegaly. Extremities:  No clubbing or cyanosis. Neurologic:  Alert and oriented x 3. Psych:  Normal affect.   Impression & Recommendations:  Problem # 1:  CAD, AUTOLOGOUS BYPASS GRAFT (ICD-414.02) Stable with no exertional symptoms.  Continue current medications (ACEI, beta blocker, ASA, Plavix, statin).  He can  decrease ASA to 81 mg daily.  I will get an echo to assess LV systolic function as this has not been done since CABG.    Problem # 2:  HYPERTENSION, BENIGN (ICD-401.1) BP is under good control.   Problem # 3:  HYPERLIPIDEMIA-MIXED (ICD-272.4) Last lipids from 5/11 showed excellent LDL (at goal < 70) but high triglycerides.  Hopefully improved blood glucose control will have helped with this.  He is going to have lipids done in Dr. Ophelia Charter office at the end of the month, asked him to send [place] a copy.   Patient asks about Viagra.  I think it would be ok for him to take it.  He has not used NTG for years and knows not to take NTG if he has taken Viagra.   Other Orders: Echocardiogram (Echo)  Patient Instructions: 1)  Your physician has recommended you make the following change in your medication:  2)  Decrease Aspirin to 81mg  daily--this should be enteric coated. 3)  Your physician has requested that you have an echocardiogram.  Echocardiography is a painless test that uses sound waves to create images of your heart. It provides your doctor with information about the size and shape of your heart and how well your heart's chambers and valves are working.  This procedure takes approximately one hour. There are no restrictions for this procedure. 4)  Your physician wants you to follow-up in: 1year with Dr Shirlee Latch.[facility] 2013)   You will receive a reminder letter in the mail two months in advance. If you don't receive a letter, please call our office to schedule the follow-up appointment. Prescriptions: NITROSTAT 0.4 MG SUBL (NITROGLYCERIN) take as directed  #100 x 3   Entered by:   Katina Dung, RN, BSN   Authorized by:   Marca Ancona, MD   Signed by:   Katina Dung, RN, BSN on 06/10/2010   Method used:   Faxed to ...       [facility]mail order pharmacy (mail-order)             , [place]         Ph: [phone]       Fax: [phone]   RxID:   [id number] ZETIA 10 MG TABS (EZETIMIBE) 1 tab  qd  #90 x 3   Entered by:   Katina Dung, RN, BSN   Authorized by:   Marca Ancona, MD   Signed by:   Katina Dung, RN, BSN on 06/10/2010   Method used:   Faxed to ...       [facility]mail order pharmacy (mail-order)             , [place]         Ph: [phone]       Fax: [phone]   RxID:   [id number] FUROSEMIDE 40 MG TABS (FUROSEMIDE)  1 tab once daily  #90 x 3   Entered by:   Katina Dung, RN, BSN   Authorized by:   Marca Ancona, MD   Signed by:   Katina Dung, RN, BSN on 06/10/2010   Method used:   Faxed to ...       [facility] (mail-order)             , [place]         Ph: [phone]       Fax: [phone]   RxID:   [id number] CRESTOR 20 MG TABS (ROSUVASTATIN CALCIUM) Take one tablet by mouth daily.  #90 x 3   Entered by:   Katina Dung, RN, BSN   Authorized by:   Marca Ancona, MD   Signed by:   Katina Dung, RN, BSN on 06/10/2010   Method used:   Faxed to ...       [facility] (mail-order)             , [place]         Ph: [phone]       Fax: [phone]   RxID:   [id number] METOPROLOL TARTRATE 25 MG TABS (METOPROLOL TARTRATE) Take one tablet by mouth twice a day  #180 x 3   Entered by:   Katina Dung, RN, BSN   Authorized by:   Marca Ancona, MD   Signed by:   Katina Dung, RN, BSN on 06/10/2010   Method used:   Faxed to ...       [facility] (mail-order)             , [place]         Ph: [phone]       Fax: [phone]   RxID:   [id number] PLAVIX 75 MG TABS (CLOPIDOGREL BISULFATE) 1 tab once daily  #90 x 3   Entered by:   Katina Dung, RN, BSN   Authorized by:   Marca Ancona, MD   Signed by:   Katina Dung, RN, BSN on 06/10/2010   Method used:   Faxed to ...       [facility] (mail-order)             , [place]         Ph: [phone]       Fax: [phone]   RxID:   [id number] ALTACE 10 MG CAPS (RAMIPRIL) 1 tab once daily  #90 x 3   Entered by:    Katina Dung, RN, BSN   Authorized by:   Marca Ancona, MD   Signed by:   Katina Dung, RN, BSN on 06/10/2010   Method used:   Faxed to ...       [facility] (mail-order)             , [place]         Ph: [phone]       Fax: [phone]   RxID:   [id number]     Vital Signs:  Patient profile:   60 year old male Height:      70 inches Weight:      239 pounds Pulse rate:   70 / minute BP sitting:   116 / 76  (left arm) Cuff size:   large  Vitals Entered By: Katina Dung, RN, BSN (June 10, 2010 11:56 AM)

## 2010-06-19 ENCOUNTER — Ambulatory Visit (HOSPITAL_COMMUNITY): Payer: PRIVATE HEALTH INSURANCE | Attending: Cardiology

## 2010-06-19 DIAGNOSIS — Z8249 Family history of ischemic heart disease and other diseases of the circulatory system: Secondary | ICD-10-CM | POA: Insufficient documentation

## 2010-06-19 DIAGNOSIS — I059 Rheumatic mitral valve disease, unspecified: Secondary | ICD-10-CM | POA: Insufficient documentation

## 2010-06-19 DIAGNOSIS — I10 Essential (primary) hypertension: Secondary | ICD-10-CM | POA: Insufficient documentation

## 2010-06-19 DIAGNOSIS — I2510 Atherosclerotic heart disease of native coronary artery without angina pectoris: Secondary | ICD-10-CM | POA: Insufficient documentation

## 2010-06-19 DIAGNOSIS — I25810 Atherosclerosis of coronary artery bypass graft(s) without angina pectoris: Secondary | ICD-10-CM

## 2010-06-19 DIAGNOSIS — E119 Type 2 diabetes mellitus without complications: Secondary | ICD-10-CM | POA: Insufficient documentation

## 2010-06-19 DIAGNOSIS — E785 Hyperlipidemia, unspecified: Secondary | ICD-10-CM | POA: Insufficient documentation

## 2010-08-19 NOTE — Assessment & Plan Note (Signed)
OFFICE VISIT   TANIS, BURNLEY  DOB:  1950-07-16                                        October 11, 2007  CHART #:  [id number]   The patient returns today for followup status post coronary artery  bypass surgery x5on 09/12/2007.  He had uncomplicated postoperative  course and has felt well.  He has started cardiac rehab.  He is walking  daily without chest pain or shortness of breath.   PHYSICAL EXAMINATION:  VITAL SIGNS:  His blood pressure is 108/70.  His  pulse is 76 and regular.  Respiratory rate is 18, unlabored.  Oxygen  saturation on room air is 93%.  GENERAL:  He looks well.  CARDIAC:  Regular rate and rhythm and normal heart sounds.  LUNGS:  Clear.  CHEST:  Chest incision is healing well and sternum is stable.  EXTREMITIES:  His leg incision is healing well, and there is no lower  extremity edema.   A followup chest x-ray shows minimal atelectasis at the left base.  There is no pleural effusion.   His medication list was reviewed.  No changes were made.   IMPRESSION:  Overall, the patient is recovering well following the  surgery.  He appears very motivated to modify his cardiac risk factors.  He has already started driving.  I told him he could return to work part-  time.  He should refrain from lifting anything heavier than 10 pounds  for a total of 3 months from the date of surgery.  He will continue to  follow up with Dr. Juanda Chance and will contact me if he develops any  problems with his incisions.   Evelene Croon, M.D.  Electronically Signed   BB/MEDQ  D:  10/11/2007  T:  10/12/2007  Job:  [id number]

## 2010-08-19 NOTE — Op Note (Signed)
[name], [name]              ACCOUNT NO.:  [id number]   MEDICAL RECORD NO.:  [id number]          PATIENT TYPE:  INP   LOCATION:  2303                         FACILITY:  [facility]   PHYSICIAN:  Evelene Croon, M.D.     DATE OF BIRTH:  March 27, 1951   DATE OF PROCEDURE:  DATE OF DISCHARGE:                               OPERATIVE REPORT   PREOPERATIVE DIAGNOSIS:  Severe 3-vessel coronary artery disease.   POSTOPERATIVE DIAGNOSIS:  Severe 3-vessel coronary artery disease.   OPERATIVE PROCEDURE:  Median sternotomy, extracorporeal circulation,  coronary bypass graft surgery x5 using a left internal mammary artery  graft to the left anterior descending coronary, with a saphenous vein  graft to the diagonal branch of the LAD, a sequential saphenous vein  graft to the obtuse marginal and posterolateral branch of the right  coronary, and a saphenous vein graft to the posterior descending branch  of the right coronary.  Endoscopic vein harvesting from the left leg.   SURGEON:  Evelene Croon, MD   ASSISTANT:  [name], MD   SECOND ASSISTANT:  [name], PA-C   ANESTHESIA:  General endotracheal.   CLINICAL HISTORY:  This patient is a 60 year old gentleman with a strong  family history of heart disease as well as a history of diabetes,  hypertension, and hyperlipidemia.  He has a known history of coronary  disease status post multiple percutaneous interventions in the past  including stenting of the mid LAD as well as unsuccessful attempt at PCI  of the distal right coronary artery in the past.  His last intervention  was about 2 years ago and he felt well for a while, but around January  this year he had noticed increasing worsening episodes of chest  discomfort radiating up to his neck with a squeezing, aching sensation.  These have usually occurred with any kind of stress or exertion.  They  have been relieved with rest and nitroglycerin.  He underwent coronary  angiography on  Sep 02, 2007, which showed severe 3-vessel disease.  The  LAD gave off 2 small diagonal branches and a large septal perforator  before giving off a large third diagonal branch.  The LAD was diffusely  diseased.  The LAD stent was in the mid portion before the third  diagonal branch had about 90% stenosis proximal to the stent.  The LAD  was occluded just beyond the third diagonal branch and filled faintly by  collaterals from the left circumflex.  It appeared diffusely diseased.  The left circumflex coronary artery gave rise to a moderate-sized  marginal branch and 3 posterolateral branches that were small.  There  was about 80% ostial left circumflex stenosis and about 80% stenosis at  the first marginal branch.  There was also about 70% stenosis in the  marginal branch itself.  The right coronary artery was heavily  calcified.  There was a stent in the very distal portion of the right  coronary with less than 10% narrowing.  There was 95% stenosis in the  proximal posterior descending with TIMI 1 flow distally.  This vessel  also filled by collaterals from the left.  The distal right coronary had  about 95% stenosis in the posterior ascending branch before 2 large  posterolateral branches.  These posterolateral branch was also filled by  collaterals from the left.  Left ventricular ejection fraction about 60%  with no mitral regurgitation.  There was no gradient across the aortic  valve.  After review of the angiogram and examination, the patient was  felt that coronary artery bypass graft surgery was the best treatment  for further ischemia or infarction improve his quality of life.  I  discussed the operative procedure with the patient and his wife  including alternatives, benefits, and risks including but not limited to  bleeding, blood transfusion, infection, stroke, myocardial infarction,  graft failure, and death.  Also, discussed the importance of maximum  cardiac risk factor  reduction.  They understood and agreed to proceed.   OPERATIVE PROCEDURE:  The patient was taken to the operating room and  placed on the table in supine position.  After induction of general  endotracheal anesthesia, a Foley catheter was placed in the bladder  using sterile technique.  Then, the chest, abdomen, and both lower  extremities were prepped and draped in the usual sterile manner.  The  chest was entered through a median sternotomy incision.  The pericardium  opened in the midline.  Examination of the heart showed good ventricular  contractility.  The ascending aorta had no palpable plaques in it.   Then, the left internal mammary artery was harvested from the chest wall  as a pedicle graft.  This was a medium-caliber vessel with excellent  blood flow through.  At the same time, a segment of greater saphenous  vein was harvested from the left leg using endoscopic vein harvest  technique.  This vein was of medium size and good quality.  We initially  examined the greater saphenous vein just below the right knee were was a  small vessel that was not ideal for bypass surgery.  This vessel was not  harvested.   Then, the patient was heparinized when an adequate activated clotting  time was achieved.  The distal ascending aorta was cannulated using a 20-  French aortic cannula for arterial inflow.  Venous outflow was achieved  using a 2-stage venous cannula through the right atrial appendage.  An  antegrade cardioplegia cannula was inserted in the aortic root.   The patient was placed on cardiopulmonary bypass and distal coronaries  identified.  He had a severe diffuse 3-vessel coronary disease.  The LAD  was grossly a calcified tube down to the takeoff of the third diagonal  branch.  This also involved the proximal portion of this third diagonal  branch.  The distal LAD did have one area that soft enough to graft.  This vessel appeared to be down to the apex.  The third diagonal  branch  was heavily diseased proximally, but one of the two small sub-branches  was graftable.  The obtuse marginal branch was a medium-sized graftable  vessel.  The right coronary artery had 2 posterolateral branches that  appeared to communicate on angiogram.  One of these was significantly  larger than the other and was suitable for grafting.  The was smaller  and had more diffuse disease in it.  The posterior descending was also a  large vessel that was heavily diseased with calcific plaque extending  out to the mid portion of the vessel.  There was segmental plaque  distally.   Then, the aorta was cross clamped and 600 mL of cold blood antegrade  cardioplegia was administered in the aortic root with quick arrest of  the heart.  Systemic hypothermia to 20 degrees centigrade and topical  hypothermic iced saline was used.  A temperature probe was placed in the  septum and insulating pad in the pericardium.  Additional doses of  cardioplegia were given down vein grafts and in the aortic root at  approximate 20 minute intervals to maintain myocardial temperature  around 10 degrees centigrade.   The first distal anastomosis was then performed to the obtuse marginal  branch.  The internal diameter of this vessel was about 1.6 mm.  Conduit  used was a segment of greater saphenous vein and the anastomosis  performed in a sequential side-to-side manner using continuous 7-0  Prolene suture.  Flow noted through the graft was excellent.   The second distal anastomosis was performed to the posterolateral branch  of the right coronary artery.  The internal diameter of this vessel was  about 1.75 mm.  The conduit used was the same segment of greater  saphenous vein and the anastomosis was performed in a sequential end-to-  side manner using a continuous 7-0 Prolene suture.  Flow noted through  the graft was excellent.   Third distal anastomosis was performed to the posterior descending   coronary artery distally.  The internal diameter was 1.75 mm.  The  conduit used was a second segment of greater saphenous vein, and the  anastomosis was performed in an end-to-side manner using a continuous 7-  0 Prolene suture.  Flow noted through the graft was excellent.   The fourth distal anastomosis was performed to the third diagonal  branch.  The internal diameter distally about 1.5 mm.  The conduit used  was a third segment of greater saphenous vein, and the anastomosis was  performed in an end-to-side manner using a continuous 7-0 Prolene  suture.  Flow noted through the graft was excellent.   The fifth distal anastomosis was performed to the distal LAD.  The  internal diameter was about 1.6 mm.  The conduit used was the left  internal mammary graft and was brought through an opening in the left  pericardium anterior to the phrenic nerve.  This was anastomosed to the  LAD in an end-to-side manner using continuous 8-0 Prolene suture.  The  pedicle was sutured to the epicardium with 6-0 Prolene sutures.  The  patient then rewarmed to 37 degrees centigrade.  With the cross-clamp in  place, the proximal anastomosis of the obtuse marginal graft and the  posterior descending grafts were performed to the aortic root in an end-  to-side manner using continuous 6-0 Prolene suture.  The vein to the  diagonal branch was too short to reach the aorta primarily and therefore  it was placed to the side of the obtuse marginal vein graft in an end-to-  side manner using continuous 7-0 Prolene suture.  I felt that this would  be a better solution than trying to find another piece of vein since we  essentially harvested all of the vein from the left leg and the vein in  the right leg was small.  Then, the clamp was removed from the mammary  pedicle.  There was rapid warming of the ventricular septum and return  of spontaneous ventricular fibrillation.  The cross-clamp was removed  time 80  minutes.  The patient spontaneously converted to sinus  rhythm.  The proximal and distal anastomoses appeared hemostatic and allowed to  graft satisfactory.  Graft markers were placed around the proximal  anastomoses.  Two temporary right ventricular and atrial pacing wires  placed above through the skin.   With the patient rewarmed to 37 degrees centigrade, he was weaned from  cardiopulmonary bypass on no inotropic agents.  Total bypass time was  110 minutes.  Then, protamine was given, and the venous and aortic  cannula were removed without difficulty.  Hemostasis was achieved.  Three chest tubes were placed into the tube in the posterior pericardium  and one in the anterior mediastinum and one in the left pleural space.  The sternum was then closed with double #6 stainless steel wires.  The  fascia was closed with continuous #1 Vicryl suture.  Subcutaneous tissue  was closed with continuous 2-0 Vicryl and the skin with a 3-0 Vicryl  subcuticular closure.  The lower extremity vein harvest site was closed  in layers in a similar manner.  The incisions then lightly coated with  Dermabond.  The sponge, needles, and instrument counts were correct  according to the scrubbers.  Dry sterile dressing applied over the  incisions around the chest tubes which were of Pleur-Evac suction.  The  patient remained hemodynamically stable and was transferred to the SICU  in stable condition.      Evelene Croon, M.D.  Electronically Signed     BB/MEDQ  D:  09/12/2007  T:  09/13/2007  Job:  [id number]   cc:   Everardo Beals. Juanda Chance, MD, [facility]

## 2010-08-19 NOTE — Assessment & Plan Note (Signed)
[facility]                            CARDIOLOGY OFFICE NOTE   NAME:[name], [name]                     MRN:          [id number]  DATE:09/29/2007                            DOB:          1950-11-05    PRIMARY CARE PHYSICIAN:  Margaretmary Bayley, MD   CARDIOVASCULAR SURGEON:  Evelene Croon, MD   CLINICAL HISTORY:  [name] returned for followup management of  his coronary heart disease after his recent bypass surgery.  He  developed increasing symptoms of angina.  We started him in the  outpatient laboratory and found that he had 3-vessel disease with  significant progression from prior studies.  He underwent surgery with  Dr. Laneta Simmers.  He did well postop, but later did developed atrial  fibrillation, went home on amiodarone.   He says he felt extremely well since his discharge.  He had been walking  and has been doing much better on his diet and glucose control.   He has soreness in his chest, but no angina and no palpitations.   PAST MEDICAL HISTORY:  Significant for hyperlipidemia and hypertension.   CURRENT MEDICATIONS:  1. Humalog insulin.  2. Metoprolol 25 mg b.i.d.  3. Amiodarone 200 mg 2 tablets daily.  4. K-Lor 20 mEq daily.  5. Aspirin 325 mg daily.  6. Plavix 75 mg daily.  7. Furosemide 40 mg daily.  8. Crestor 20 mg daily.  9. Zetia 10 mg daily.  10.Janumet.  11.Amitriptyline.   PHYSICAL EXAMINATION:  VITAL SIGNS:  Blood pressure is 106/72 and pulse  74 and regular.  NECK:  There is no venous distention.  The carotid pulses were full  without bruits.  CHEST:  Clear.  HEART:  Rhythm was regular.  No murmurs or gallops.  ABDOMEN:  Soft with normal bowel sounds.  There is no  hepatosplenomegaly.  EXTREMITIES:  There is trace peripheral edema.  Pedal pulses were  symmetrical.   IMPRESSION:  1. Coronary artery disease status post recent coronary bypass graft      surgery.  2. Good left ventricular systolic function.  3.  Insulin-dependent diabetes.  4. Hypertension.  5. Hyperlipidemia.  6. Postoperative paroxysmal atrial fibrillation.   RECOMMENDATIONS:  I think the patient is doing very well following  bypass surgery.  We will plan to get a BMP and CBC today.  His potassium  was in the mid normal range or higher.  I think we can stop the  potassium supplement.  If his hemoglobin is low then we may consider  iron.  I told him he is to cut his amiodarone from 400 to 200 a day and  then stop when he finishes his current supply, which will be about 2-3  weeks from now.  He is to see Dr. Laneta Simmers next week and I will plan to  see him back in 6-8 weeks.     Bruce Elvera Lennox Juanda Chance, MD, [facility]  Electronically Signed    BRB/MedQ  DD: 09/29/2007  DT: 09/30/2007  Job #: [id number]   cc:   Margaretmary Bayley, M.D.  Evelene Croon, M.D.

## 2010-08-19 NOTE — Assessment & Plan Note (Signed)
[facility]   NAME:  [name], [name]              ACCOUNT NO.:  [id number]   MEDICAL RECORD NO.:  [id number]      DATE OF BIRTH:  [birth year]   PHYSICIAN:  Theresia Majors. Tanda Rockers, M.D. VISIT DATE:  07/19/2007                                   OFFICE VISIT   SUBJECTIVE:  [name] is a 60 year old whom we are following for  Wagner II diabetic foot ulcer involving the right hallux.  In the  interim, we have treated him with a modified healing sandal with  offloading of the first metatarsal head.  He has been applying Iodosorb  gel every 2 to 3 days and continues daily antiseptic soap washes with a  bulky dressing predominantly using a clean cotton sock.  There has been  no interim fever.  He continues to be ambulatory.   OBJECTIVE:  VITALS:  Blood pressure is 110/66, respirations 16, pulse  rate 67, temperature 97.9, and capillary blood glucoses is 161 mg  percent.   Inspection of the right plantar surface of the hallux shows that the  ulcer is clean.  There is no gross evidence of infection.  There is no  hyperemia.  The dorsalis pedis pulse remains bounding and 3+.  A Q-tip  was used to sound the wound that extends down to the joint capsule.  There is a moderate amount of callus, which required paring.  No  hemorrhage was stimulated.   ASSESSMENT:  [address] II diabetic foot ulcer and inadequate offloading.   PLAN:  We have recommended that we proceed with total contact cast into  effect total offloading.  We have explained the technique and the  indications to the patient in terms that he seems to understand.  We  have given him an opportunity to ask questions.  He does not have a  driver with him at present.  We, therefore, will arrange for him to come  back to the clinic at a later date to have the total contact cast  placed.  In the interim, we will continue him with a bulky dressing and  utilization of the modified heal and sandal with offloading of the first  metatarsal head.  We will reevaluate him in 1 week following his cast or  earlier based on his culture reports.      Harold A. Tanda Rockers, M.D.  Electronically Signed     HAN/MEDQ  D:  07/19/2007  T:  07/20/2007  Job:  [id number]

## 2010-08-19 NOTE — Assessment & Plan Note (Signed)
[facility]                            CARDIOLOGY OFFICE NOTE   NAME:[name], [name]                     MRN:          [id number]  DATE:08/31/2007                            DOB:          1950-07-09    PRIMARY CARE PHYSICIAN:  Dr. Margaretmary Bayley.   CLINICAL HISTORY:  Mr. Stetzer is 60 years old and returns for  management of his coronary heart disease.  He has had multiple  percutaneous interventions including an unsuccessful attempt at PCI of  the distal right coronary artery.  He has total occlusion of the  posterolateral branch of the right coronary and nonobstructive disease  in the left system with stents in the LAD.  He says over the last two  months he has developed increasing chest pain.  When he hurries, he gets  pain and has to stop.  He also has had some pain with lying down at  night.  Usually he will stop and not have to take a nitroglycerin, but  he sometimes does take a nitroglycerin.  He has had no other rest pain  other than precipitated by lying down at night.   He has an older brother who had bypass surgery and has another brother  who is slightly older than him who had bypass surgery about two months  ago.   His past medical history is significant for diabetes.  He also has  hyperlipidemia and hypertension.   His current medications include:  1. Altace 10 mg b.i.d.  2. Humalog insulin.  3. Isosorbide 6 mg daily.  4. Fish oil.  5. Aspirin 325 mg daily.  6. Norvasc 5 mg daily.  7. Plavix 75 mg daily.  8. Furosemide 40 mg daily.  9. Crestor 20 mg daily.  10.Spironolactone 25 mg daily.  11.Zetia 10 mg daily.  12.Janumet 50-1000 mg 2 daily.  13.Amitriptyline 75 mg daily.  14.Toprol XL 100 mg in the morning and 25 mg in the afternoon.   On examination, blood pressure 119/73 and pulse is 75 and regular.  There was no venous distention.  The carotid pulses were full without  bruits.  CHEST:  Was clear without rales or  rhonchi.  CARDIAC:  Rhythm was regular.  No murmurs or gallops.  ABDOMEN:  Soft, no organomegaly.  Peripheral pulses were full with no peripheral edema.   Electrocardiogram was normal.   IMPRESSION:  1. Coronary artery status post multiple percutaneous coronary      interventions with coronary anatomy as described above.  2. Increasing symptoms of angina.  3. Good left ventricular function.  4. Insulin-dependent diabetes.  5. Hyperlipidemia.  6. Hypertension.   RECOMMENDATIONS:  Nadine Counts has increasing symptoms of angina despite good  medical therapy.  I think we should evaluate him further with cardiac  catheterization, and we have arranged for him to come in the hospital on  Friday to undergo catheterization in the [facility] laboratory.  His pulse rate  is in the 70s, so I increased his Toprol from 100 in the am and 25 in  p.m. to 100 in the a.m. and 50 in the  p.m.  He is also on Janumet, so  will stop this the day before his catheterization and will substitute  Januvia 100 mg daily while he is off the Janumet 50-1000 mg 2 daily.     Bruce Elvera Lennox Juanda Chance, MD, [facility]  Electronically Signed    BRB/MedQ  DD: 08/31/2007  DT: 08/31/2007  Job #: [id number]

## 2010-08-19 NOTE — Assessment & Plan Note (Signed)
[facility]   NAME:  [name], [name]              ACCOUNT NO.:  [id number]   MEDICAL RECORD NO.:  [id number]      DATE OF BIRTH:  14-Oct-1950   PHYSICIAN:  Theresia Majors. Tanda Rockers, M.D. VISIT DATE:  08/26/2007                                   OFFICE VISIT   SUBJECTIVE:  [name] is a 60 year old man who we have followed for  right plantar ulcer Wagner grade 2.  We treated him with a total contact  cast and have placed him in order for custom orthotics.  He was last  seen on Aug 12, 2007.  He continues to be ambulatory.  There has been no  drainage or excessive swelling nor pain.   OBJECTIVE:  VITAL SIGNS:  Blood pressure is 121/66, respirations 16,  pulse rate 71, and temperature 97.9.  Capillary blood glucose is 180 mg  percent.  EXTREMITIES:  Inspection of the right lower extremity shows that the  previous ulcer is completely re-epithelialized.  There is no evidence of  active infection, hyperemia, or drainage.  The pedal pulses readily  palpable.   ASSESSMENT:  Resolved Wagner 2 diabetic foot ulcer.   PLAN:  We are discharging the patient from active followup in the wound  center.  He will procure his orthotics and begin wearing them as  directed.  He will continue his appointments per Dr. Chestine Spore, the  referring physician.      Harold A. Tanda Rockers, M.D.  Electronically Signed     HAN/MEDQ  D:  08/26/2007  T:  08/27/2007  Job:  [id number]

## 2010-08-19 NOTE — Assessment & Plan Note (Signed)
[facility]   NAME:  [name], [name]              ACCOUNT NO.:  [id number]   MEDICAL RECORD NO.:  [id number]      DATE OF BIRTH:  [birth year]   PHYSICIAN:  Theresia Majors. Tanda Rockers, M.D. VISIT DATE:  07/05/2007                                   OFFICE VISIT   SUBJECTIVE:  [name] is a 60 year old man who we are treating for a  Wagner 2 diabetic foot ulcer involving the first met head.  In the  interim, he has worn an off-loading healing sandal.  Has begun daily  antiseptic soap washes with a bulky 4 x 4 Kerlix for offloading.  He  continues to be ambulatory.  There has been no excessive drainage,  malodor, pain or fever.   OBJECTIVE:  Blood pressure is 120/70, respirations 16, pulse rate 72,  temperature 98.2, capillary blood glucose is 171 mg percent.  Inspection of the right plantar surface of the foot shows that the ulcer  has significantly contracted.  There is clean, healthy, granulating  tissue.  There is no evidence of infection.  The foot is warm but is not  feverish.  The capillary refill is brisk, there is no evidence of  ischemia. The culture has shown a normal skin flora.  The x-ray has  shown no evidence of osteomyelitis.   ASSESSMENT:  Clinical improvement with local care and off loading.  Clinical improvement, Wagner 2, diabetic foot ulcer.   PLAN:  We have given the patient a prescription for custom orthotics.  We have asked him to be fitted immediately.  In the interim, we will  continue him with daily antiseptic soap washes, a cotton sock, and  continuation of the off-loading sandal.  We have added Iodosorb gel to  be applied every 2 to 3 days as instructed.  We have given the patient  the opportunity to ask questions.  He seems to understand and indicates  that he will be compliant.  We will reevaluate him in 2 weeks p.r.n.      Harold A. Tanda Rockers, M.D.  Electronically Signed     HAN/MEDQ  D:  07/05/2007  T:  07/05/2007  Job:  [id number]

## 2010-08-19 NOTE — Assessment & Plan Note (Signed)
[facility]                            CARDIOLOGY OFFICE NOTE   NAME:[name], [name]                     MRN:          [id number]  DATE:06/04/2008                            DOB:          December 16, 1950    PRIMARY CARE PHYSICIAN:  Ramon Dredge L. Juanetta Gosling, MD   ENDOCRINOLOGIST:  Margaretmary Bayley, MD   CLINICAL HISTORY:  [name] is 60 year old who returned for  followup management with coronary heart disease.  He had bypass surgery  in 2009 after having prior multiple PCIs.  He has done quite well since  that time.  He has had no recent chest pain, shortness of breath, or  palpitations.  He did have some anterior and back chest pain, which  seemed to be positional and sounded musculoskeletal.   His past medical history significant for hypertension, hyperlipidemia,  and insulin-dependent diabetes.   His current medications include Humalog insulin, fish oil, metoprolol 25  mg b.i.d., aspirin, multivitamins, Plavix 75 mg daily, furosemide 40 mg  daily, Crestor 20 mg daily, Zetia 10 mg daily, Janumet 50/1000 two  daily, and amitriptyline.   PHYSICAL EXAMINATION:  VITAL SIGNS:  Blood pressure was 137/86, pulse 73  and regular.  NECK:  There was no venous distension.  Carotid pulses were full without  bruits.  CHEST:  Clear.  HEART:  Rhythm is regular.  No murmurs or gallops.  ABDOMEN:  Soft.  No organomegaly.  Peripheral pulses full with no  peripheral edema.   SOCIAL HISTORY:  [name] is married.  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  He does not smoke.   IMPRESSION:  1. Coronary artery disease, status post coronary bypass graft surgery      in June 2009, stable.  2. Good left ventricular function.  3. Insulin-dependent diabetes.  4. Hypertension.  5. Hyperlipidemia.  6. Postoperative atrial fibrillation, now off amiodarone.   RECOMMENDATIONS:  I think [name] is doing quite well.  He says his energy  level is much better since his surgery.  He is not on an ACE inhibitor.  With his diabetes and creatinine of 1.3, I think  he should be, so we started back on Altace 10 mg daily.  We will get a  BMP as well as lipid and liver and CBC in about a week.  I will plan to  see him back in a year.     Bruce Elvera Lennox Juanda Chance, MD, [facility]  Electronically Signed    BRB/MedQ  DD: 06/04/2008  DT: 06/05/2008  Job #: [id number]

## 2010-08-19 NOTE — Assessment & Plan Note (Signed)
[facility]   NAME:  [name], [name]              ACCOUNT NO.:  [id number]   MEDICAL RECORD NO.:  [id number]      DATE OF BIRTH:  01-Mar-1951   PHYSICIAN:  Theresia Majors. Tanda Rockers, M.D. VISIT DATE:  07/29/2007                                   OFFICE VISIT   SUBJECTIVE:  Mr. Harner is a 60 year old who we are following for  Wagner II diabetic foot ulcer.  We have treated him with a total contact  cast.  In the interim, he denies pain or fever.  He continues to be  ambulatory and does elevate his leg as much as possible.  He is  accompanied by his wife.   PHYSICAL EXAMINATION:  VITAL SIGNS:  Blood pressure is 134/80,  respirations 18, pulse rate 69, temperature is 98.5, and capillary blood  glucose is 131 mg%.  EXTREMITIES:  Inspection of the right volar, metatarsal head shows that  the ulcer has dramatically decreased.  There is a significant reduction  in volume and area.  There is 100% granulation with advancing  epithelium.   ASSESSMENT:  Clinical response to adequate offloading.   PLAN:  We will return the patient to a total contact cast for 2 weeks.  We will reevaluate him at the end of that time in anticipation that he  will be ready for casting for custom inserts.      Harold A. Tanda Rockers, M.D.  Electronically Signed     HAN/MEDQ  D:  07/29/2007  T:  07/30/2007  Job:  [id number]

## 2010-08-19 NOTE — Assessment & Plan Note (Signed)
[facility]                            CARDIOLOGY OFFICE NOTE   [name], [name]                     MRN:          [id number]  DATE:01/07/2007                            DOB:          1950/07/23    REFERRING PHYSICIAN:  Rollene Rotunda, MD, [facility]   ENDOCRINOLOGIST:  Margaretmary Bayley, M.D.   CLINICAL HISTORY:  [name] is 60 years old and has returned for management  of his coronary heart disease.  He has had multiple percutaneous  interventions including an unsuccessful attempt at PCI of a distal right  coronary artery.  He also has total occlusion of posterolateral branch  of the right coronary artery.  We have been managing medically and have  adjusted his Imdur and beta-blockers last visit and he has done quite  well since that time.  He had been having some occasional nocturnal  angina and this has resolved.  The only time he gets angina is when he  really stresses himself.  He has been working in his [employer] business and has been quite active and not had much in the way  of symptoms related to his work activities.   PAST MEDICAL HISTORY:  Significant for:  1. Diabetes which is managed by Margaretmary Bayley.  He has recently been      put on insulin.  2. Hyperlipidemia.  3. Hypertension.   CURRENT MEDICATIONS:  Altace, Humalog insulin, Janumet, isosorbide, Fish  Oil, aspirin, Plavix, Norvasc, furosemide, Crestor, Zetia,  spironolactone, Fish Oil, amitriptyline and Toprol.   EXAMINATION:  The blood pressure is 116/72 and the pulse 68 and regular.  There was no venous distention.  The carotid pulses were full without  bruits.  CHEST:  Clear, without rales or rhonchi.  The cardiac rhythm was  regular.  The heart sounds were normal.  There were no murmurs or  gallops.  ABDOMEN:  Soft.  There were normal bowel sounds.  There was no  hepatosplenomegaly.  Pedal pulses are equal, there is no peripheral  edema.   An electrocardiogram  was normal.   IMPRESSION:  1. Coronary artery disease status post multiple percutaneous coronary      interventions with residual disease in the right coronary artery,      now stable with minimal angina.  2. Good left ventricular function.  3. Insulin-dependent diabetes.  4. Hyperlipidemia.  5. Hypertension.   RECOMMENDATIONS:  I think [name] is doing quite well at present.  He is not  having much in the way of angina.  Will plan to get a fasting lipid  profile as well as a BNP, CBC and hemoglobin A1c.  I will plan to see  him back in 6 months.     [name] Elvera Lennox Juanda Chance, MD, [facility]  Electronically Signed    BRB/MedQ  DD: 01/07/2007  DT: 01/07/2007  Job #: [id number]   cc:   Rollene Rotunda, MD, [facility]  Margaretmary Bayley, M.D.

## 2010-08-19 NOTE — Consult Note (Signed)
NEW PATIENT CONSULTATION   [name], [name]  DOB:  Jan 20, 1951                                        September 06, 2007  CHART #:  [id number]   REFERRING PHYSICIAN:  Everardo Beals. [name] Chance, MD, [name].   REASON FOR CONSULTATION:  Severe 3-vessel coronary artery disease with  progressive angina.   CLINICAL HISTORY:  I was asked by Dr. Juanda [name] to evaluate [name] for  consideration of coronary artery bypass graft surgery.  He is a 60-year-  old gentleman with a strong family history of heart disease as well as a  history of diabetes, hypertension, and hyperlipidemia.  He has a known  history of coronary disease status post multiple percutaneous  interventions in the past including stenting the mid LAD as well as an  unsuccessful attempt at PCI of the distal right coronary artery in the  past.  The patient said his last intervention was about 2 years ago, and  he felt well for a while with a change in his medications.  Since around  January, he has noticed worsening episodes of chest discomfort up into  his neck with a squeezing, aching sensation.  These have usually  occurred with any kind of stress or exertion.  They have been relieved  with rest and sublingual nitroglycerin when needed.  He underwent  coronary angiography again on Sep 02, 2007, which showed significant 3-  vessel coronary disease.  The LAD gave off 2 small diagonal branches and  a large septal perforator before a large third diagonal branch.  The LAD  was occluded in its midportion just beyond this third diagonal branch.  The LAD stent was in the midportion before this diagonal branch and had  about 90% stenosis just proximal to the stent.  The distal LAD filled by  collaterals from the left circumflex and was relatively a small vessel  and diffusely diseased with calcific plaque.  Essentially, all of his  arteries were fairly heavily diseased and visible prior to contrast  injection.  The left circumflex  gave rise to a moderate-sized marginal  branch and then 3 small posterolateral branches.  There was about 80%  ostial left circumflex stenosis and about 80% stenosis after the first  marginal.  There was about 70% stenosis in the marginal branch.  The  right coronary artery was heavily calcified.  There was a stent in the  very distal portion of the right coronary artery with less than 10%  narrowing.  There was 95% stenosis in the proximal PDA with TIMI-1 flow  distally.  This vessel was also filled by collaterals from the left  coronary artery.  There was 95% stenosis at the posterior ascending  branch before 2 large posterolateral branches.  Left ventriculogram  showed an ejection fraction of about 60%.  There was no significant  mitral regurgitation.  There was no gradient across the aortic valve.   REVIEW OF SYSTEMS:  GENERAL:  He denies any fever or chills.  He has had  no recent weight changes.  He does report fatigue.  EYES:  Negative.  ENT:  Negative.  ENDOCRINE:  He denies hypothyroidism.  He has had diabetes for about 13  years, managed by Dr. Margaretmary [name], and has been on insulin for at  least last year.  His hemoglobin A1c had been up  recently.  CARDIOVASCULAR:  As above.  He denies PND and orthopnea.  He has had  mild peripheral edema.  He denies palpitations.  RESPIRATORY:  He denies cough and sputum production.  GI:  He has had no nausea or vomiting.  He denies melena and bright red  blood per rectum.  GU:  He denies dysuria and hematuria.  MUSCULOSKELETAL:  He denies arthralgias and myalgias.  NEUROLOGICAL:  He denies any focal weakness and numbness.  He denies  dizziness and syncope.  He has never had a TIA or stroke.  PSYCHIATRIC:  Negative.  HEMATOLOGICAL:  Negative.   ALLERGIES:  PENICILLIN, which caused a rash as a child.   MEDICATIONS:  1. Altace 10 mg b.i.d.  2. Humalog insulin.  3. Isosorbide 60 mg daily.  4. Fish oil daily.  5. Aspirin 325 mg daily.   6. Norvasc 5 mg daily.  7. Plavix 75 mg daily.  8. Lasix 40 mg daily.  9. Crestor 20 mg daily.  10.Spironolactone 25 mg daily.  11.Zetia 10 mg daily.  12.Amitriptyline 75 mg daily.  13.Toprol-XL 100 mg q.a.m. and 50 mg q.p.m.  14.Janumet 50/1000 two daily.   PAST MEDICAL HISTORY:  Significant for diabetes as mentioned above.  He  has history of hyperlipidemia and hypertension.  He is status post back  surgery in the past and is status post appendectomy.   FAMILY HISTORY:  Very strongly positive for coronary disease.  He has an  older brother who had bypass surgery and another brother who had bypass  surgery about 2 months ago up in [place].  Multiple other family  members had heart disease.   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  He is married and  lives with his wife.  He has never smoked, but used to chew some  tobacco.  He denies alcohol abuse.   PHYSICAL EXAMINATION:  Vital Signs:  His blood pressure is 114/75, and  his pulse is 78 and regular.  Respiratory rate is 20, unlabored.  Oxygen  saturation on room air is 94%.  General:  He is a slightly obese white  male in no distress.  HEENT:  Shows him to be normocephalic and  atraumatic.  Pupils are equal and reactive to light and accommodation.  Extraocular muscles are intact.  His throat is clear.  Neck:  Shows  normal carotid pulses bilaterally.  There are no bruits.  There is no  adenopathy or thyromegaly.  Cardiac:  Shows regular rate and rhythm with  normal S1 and S2.  There is no murmur, rub, or gallop.  Lungs:  Clear.  Abdomen:  Shows active bowel sounds.  His abdomen is soft and nontender.  There are no palpable masses or organomegaly.  Extremities:  Show no  peripheral edema.  Pedal pulses are palpable bilaterally.  Skin:  Warm  and dry.  Neurologic:  Shows him to be alert and oriented x3.  Motor and  sensory exam is grossly normal.   Carotid Doppler examination shows no evidence of internal  carotid artery  stenosis bilaterally.  Upper extremity arterial exam shows that both  palmar arches are dependent on the radial artery, precluding use of  those as bypass grafts.  Chest x-ray showed no active disease.   IMPRESSION:  Mr. Beske has severe 3-vessel coronary disease with  worsening angina.  I agree that coronary artery bypass graft surgery is  the best treatment to prevent further ischemia and infarction.  I  discussed the  operative procedure with he and his wife including  alternatives, benefits, and risks including but not limited to bleeding,  blood transfusion, infection, stroke, myocardial infarction, renal  failure, and death.  They understand and agree to proceed.  We will  tentatively plan to do this on Monday, September 12, 2007.   Evelene Croon, M.D.  Electronically Signed   BB/MEDQ  D:  09/06/2007  T:  09/07/2007  Job:  [id number]   cc:   [name], M.D.  Bruce Elvera Lennox [name] Chance, MD, [facility]

## 2010-08-19 NOTE — Assessment & Plan Note (Signed)
[facility]   NAME:  [name], [name]              ACCOUNT NO.:  [id number]   MEDICAL RECORD NO.:  [id number]      DATE OF BIRTH:  1951/02/19   PHYSICIAN:  Theresia Majors. Tanda Rockers, M.D. VISIT DATE:  08/12/2007                                   OFFICE VISIT   SUBJECTIVE:  [name] is a 60 year old man who we have followed for a  Wagner II diabetic foot ulcer involving his right foot.  We have treated  the patient in a total contact cast for several weeks.  He returns for  follow-up.  There has been no interim drainage, malodor, pain, or fever.   OBJECTIVE:  Blood pressure 107/73, respirations 16, pulse rate 79,  temperature 98.6. Capillary blood glucose is 149 mg %.  Inspection of  the right plantar surface of the foot shows that the wound has resolved.  There is 100% re-epithelialization.   Upon removing the left shoe, it was noted that the patient had a nail  driven through the sole of the foot, sole of the shoe, and running into  the padding of the shoe internally.  There was penetration of the  padding, but inspection of the volar surface of the left foot failed to  disclose any direct injury.   ASSESSMENT:  Resolved Wagner II diabetic foot ulcer, right foot.   PLAN:  We have recommended that the patient proceed with the procurement  of the custom orthotic inserts.  In the interim, we have placed in an  offloading healing sandal.  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  The patient  expresses gratitude and indicates that he will be compliant.  We will  reevaluate him in 2 weeks.  Hopefully, he will have his orthotics at  that time.      Harold A. Tanda Rockers, M.D.  Electronically Signed     HAN/MEDQ  D:  08/12/2007  T:  08/13/2007  Job:  [id number]

## 2010-08-19 NOTE — Discharge Summary (Signed)
[name], CORTOPASSI NO.:  [id number]   MEDICAL RECORD NO.:  [id number]          PATIENT TYPE:  INP   LOCATION:  2029                         FACILITY:  [facility]   PHYSICIAN:  Evelene Croon, M.D.     DATE OF BIRTH:  12/18/1950   DATE OF ADMISSION:  09/12/2007  DATE OF DISCHARGE:                               DISCHARGE SUMMARY   Date of anticipated discharge, September 17, 2007.   PRIMARY ADMITTING DIAGNOSIS:  Chest pain.   ADDITIONAL/DISCHARGE DIAGNOSES:  1. Severe 3-vessel coronary artery disease.  2. Progressive angina.  3. History of coronary artery disease status post multiple      percutaneous interventions.  4. Type 2 insulin-dependent diabetes mellitus.  5. Hypertension.  6. Hyperlipidemia.  7. Postoperative atrial fibrillation.   PROCEDURES PERFORMED:  1. Coronary artery bypass grafting x5 (left internal mammary artery to      the LAD, saphenous vein graft to the diagonal, sequential saphenous      vein graft to the obtuse marginal, and the right posterolateral,      saphenous vein graft to posterior descending).  2. Endoscopic vein harvest, left leg.   HISTORY:  The patient is a 60 year old male with a known history of  coronary artery disease.  He is status post previous multiple  percutaneous interventions, most recently about 2 years ago.  He has  been managed medically since that time.  Since around January of this  year, he has had episodes of chest discomfort which have occurred with  exertion and relieved with rest.  The episodes have been occurring more  frequently, and he recently saw Dr. Juanda Chance and subsequently underwent  repeat cardiac catheterization.  This showed significant 3-vessel  coronary artery disease which was not felt to be amenable to  percutaneous intervention.  Ejection fraction was about 60%.  Because of  his severe 3-vessel disease and his worsening anginal-type symptoms, he  was referred to Dr. Laneta Simmers as an outpatient  consultation for  consideration of surgical revascularization.  Dr. Laneta Simmers reviewed his  films and agreed that he would benefit from CABG at this time.  He  explained the risks, benefits, and alternatives of surgery to the  patient, and he agreed to proceed.   HOSPITAL COURSE:  He was admitted to [facility] on September 12, 2007, and underwent CABG x5 as described in detail above performed by  Dr. Laneta Simmers.  He tolerated the procedure well and was transferred to the  SICU in stable condition.  He was able to be extubated shortly after  surgery.  He was hemodynamically stable and doing well on postop day #1.  He did have some hypoxemia and required BiPAP initially.  It was felt to  be secondary to atelectasis and edema.  He was aggressively diuresed for  volume overload.  He was kept in the unit for further observation.  His  chest tubes and hemodynamic monitoring devices were removed in the usual  fashion.  By postop day #2, he was remaining stable and able to be  transferred to the floor.  Postoperatively, his blood sugars  have been  elevated.  He initially was treated with Lantus until his p.o. intake  improved.  At that point, he was restarted on his home dose of Janumet  and was also restarted on his home insulin regimen.  This is currently  being titrated, and we will monitor his CBGs for response.  Of note, his  hemoglobin A1c on admission was 10.2, and he is followed by Dr. Margaretmary Bayley as an outpatient.  He developed atrial fibrillation and was  started on amiodarone as well as a beta blocker.  He did convert to  normal sinus rhythm, and his rhythm has remained stable since that time.  His blood pressures have been running anywhere between 90 and 120  systolic and because of this, he has not been restarted on his home dose  of ACE inhibitor at this point.  He has remained afebrile and all other  vital signs have been stable.  He is currently maintaining O2 sats of  greater  than 90% on room air.  His incisions are all healing well.  He  is ambulating well.  His cardiac rehab phase 1 and is making good  progress.  He is tolerating a regular diet and is having normal bowel  and bladder function.  His most recent labs show hemoglobin of 11.8,  hematocrit 33.7, platelets 133, white count 7.3, sodium 133, potassium  4, BUN 16, creatinine 1.22.  It was felt that he continues to remain  stable over the next 24 to 48 hours, and his blood sugars are better  controlled on home medication regimen.  He will hopefully be ready for  discharge home.   DISCHARGE MEDICATIONS:  Are as follows:  1. Enteric-coated aspirin 325 mg daily.  2. Metoprolol 50 mg b.i.d.  3. Zetia 10 mg q.h.s.  4. Crestor 20 mg q.h.s.  5. Plavix 75 mg daily.  6. Amitriptyline 75 mg q.h.s.  7. Janumet 50/1000 mg b.i.d.  8. Lasix 40 mg daily.  9. Potassium 20 mEq daily.  10.Amiodarone 400 mg b.i.d. x 5 days and 200 mg b.i.d.  11.Tylox 1 to 2 q.4 h. p.r.n. for pain.  12.Fish oil 1000 mg daily.  13.Multivitamin daily.  14.Humalog 30 units regular q.p.m.  15.Humalog 75/25 60 units q.a.m.  His doses of insulin are subject to      change time of discharge based on his CBGs.   DISCHARGE INSTRUCTIONS:  He is asked to refrain from driving, heavy  lifting, or strenuous activity.  He may continue ambulating daily using  his incentive spirometer.  He may shower daily and clean his incisions  with soap and water.  He will continue low-fat, low-sodium,  carbohydrate, modified medium calorie diet.   DISCHARGE FOLLOWUP:  He will make an appointment to see Dr. Juanda Chance in 2  weeks.  He will see Dr. Laneta Simmers in 3 weeks with a chest x-ray.  He is  also asked to have an appointment to see Dr. Chestine Spore in the next 1-2 weeks  for recheck of his blood sugars.  In the interim if he experiences  problems or has questions, he is asked to contact our office  immediately.      Coral Ceo, P.A.      Evelene Croon,  M.D.  Electronically Signed    GC/MEDQ  D:  09/16/2007  T:  09/17/2007  Job:  [id number]   cc:   Margaretmary Bayley, M.D.  Bruce Elvera Lennox Juanda Chance, MD, [facility]

## 2010-08-19 NOTE — Cardiovascular Report (Signed)
NAMELATHAN, GIESELMAN              ACCOUNT NO.:  [id number]   MEDICAL RECORD NO.:  [id number]          PATIENT TYPE:  OIB   LOCATION:  1965                         FACILITY:  [facility]   PHYSICIAN:  Bruce R. 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  He has known coronary artery disease and has had  previous stenting of the mid left anterior descending artery and distal  right coronary artery.  He also had an unsuccessful attempt at stenting  of the posterior descending branch of the right coronary artery.  Recently, he has been having increasing symptoms of angina, despite  pretty optimal medical therapy, and we brought him in for reevaluation  with coronary angiography.   PROCEDURE:  The procedure was performed by the right femoral artery and  arterial sheath and 4-French pyriform coronary catheters.  A front wall  arterial puncture was performed, and Omnipaque contrast was used.  A  subclavian injection was injected to assess the intramammary artery for  bypass grafting.  The patient tolerated the procedure well and left the  laboratory in satisfactory condition.   RESULTS:  The aortic pressure was 87/60 with mean of 72 and left  ventricular pressure was 87/12.   Left main coronary artery:  The left main coronary artery was free of  disease.   Left anterior descending artery:  The left anterior descending artery  gave rise to 2 small diagonal branches, a large septal perforator and a  third large diagonal branch at its midportion and then was completely  occluded distally.  There was a stent in the midportion just before the  larger diagonal branch and had less than 10% narrowing.  There was 90%  stenosis in the mid LAD, just proximal to the stent.  The distal LAD was  filled  via collaterals from the circumflex artery, but it was a fairly  small segment of vessel.   Circumflex artery:  The circumflex artery gave rise to a marginal branch  and 3 posterolateral branches.  There was 80% ostial stenosis.  There  was 80% stenosis after the first marginal branch in the proximal mid  vessel.  There was 70% narrowing in the marginal branch.  There was  moderately heavy calcification.   Right coronary artery:  The right coronary artery was heavily calcified  vessel gave rise to right ventricle branch of posterior ascending branch  and 2 posterolateral branches.  There was a stent in the very distal  portion of the right coronary with less than 10% narrowing.  There was  95% stenosis in the proximal posterior descending branch with TIMI-1  flow distally.  This vessel also filled via collaterals from the left  coronary artery.  There was 95% stenosis at the posterior ascending  branch before 2 large posterolateral branches.  These had TIMI-1 flow  antegrade filled via collaterals from the left coronary artery.   Left ventriculogram:  The left  ventriculogram upon RAO projection showed  good wall motion and no areas of hypokinesis.  Estimated ejection  fraction was 60%.   CONCLUSION:  Severe three-vessel coronary artery disease with 90%  stenosis in the mid left anterior descending, less than 10% stenosis at  the stent site in the mid left anterior descending, and total occlusion  in the distal left anterior descending, 80% ostial and 80% proximal  stenosis in the circumflex artery with 70% stenosis in the marginal  branch, and less than 10% stenosis at the stent site in the distal right  coronary artery with 95% stenosis at the posterior ascending branch and  95% stenosis before 2 large posterolateral branches with normal left  ventricular function and an estimated ejection fraction of 60%.   RECOMMENDATIONS:  The patient has had significant progression of disease   in the distal and mid LAD and in the circumflex artery.  There is no  area of his heart that is getting normal circulation.  I think bypass  surgery is clearly his best option.  Chance for revascularization  include the marginal branch and posterolateral branch of the circumflex  artery, the diagonal branch of the LAD, and subsequently the distal LAD,  and the posterior ascending and 2 posterolateral branches of the right  coronary artery.      Bruce Elvera Lennox Juanda Chance, MD, [facility]  Electronically Signed     BRB/MEDQ  D:  09/02/2007  T:  09/03/2007  Job:  [id number]   cc:   Carole Binning, M.D. [facility]

## 2010-08-19 NOTE — Assessment & Plan Note (Signed)
[facility]                            CARDIOLOGY OFFICE NOTE   NAME:BISTYGADaschel, Roughton                     MRN:          [id number]  DATE:11/04/2007                            DOB:          1951-02-15    PRIMARY CARE PHYSICIAN:  Ramon Dredge L. Juanetta Gosling, M.D.   ENDOCRINOLOGIST:  Margaretmary Bayley, M.D.   CLINICAL HISTORY:  [name] is 60 years old and returns for a  followup management of his coronary heart disease.  He had a bypass  surgery on September 12, 2007, for three-vessel disease.  He has had done  quite well since that time.  He has been back at work 2 weeks and he is  involved in the rehab program.  He has had no chest pain, shortness of  breath, or palpitations.   PAST MEDICAL HISTORY:  Significant hypertension, hyperlipidemia.   CURRENT MEDICATIONS:  1. Insulin.  2. Fish oil.  3. Metoprolol 25 mg b.i.d.  4. Aspirin.  5. Plavix.  6. Furosemide 40 mg daily.  7. Crestor.  8. Zetia.  9. Janumet  10.Amitriptyline.   PHYSICAL EXAMINATION:  VITAL SIGNS:  Blood pressure 127/76 and pulse 70  and regular.  NECK:  There was no venous distention.  Carotid pulses were full without  bruits.  CHEST:  Clear.  CARDIAC:  Rhythm was regular.  There are no murmurs or gallops.  ABDOMEN:  Soft without organomegaly.  EXTREMITIES:  Peripheral pulses were full with no peripheral edema.  Vein sites were healed.   IMPRESSION:  1. Coronary artery disease status post coronary bypass graft surgery,      September 12, 2007, stable.  2. Good left ventricular function.  3. Insulin-dependent diabetes.  4. Hypertension.  5. Hyperlipidemia.  6. Postoperative atrial fibrillation, now off amiodarone.   RECOMMENDATIONS:  I think Nadine Counts is doing very well.  His last lipid  profile with LDLs in the 40s, but his HDL was low, his triglycerides  were high.  So, I think he might be better on Niaspan  and Zetia.  We will get a fasting lipid profile and then will make a  decision  about a change.  I will plan to see him back in 6 months.     Bruce Elvera Lennox Juanda Chance, MD, [facility]  Electronically Signed    BRB/MedQ  DD: 11/04/2007  DT: 11/04/2007  Job #: [id number]

## 2010-08-19 NOTE — Consult Note (Signed)
NAMEAYREN, ZUMBRO              ACCOUNT NO.:  [id number]   MEDICAL RECORD NO.:  [id number]          PATIENT TYPE:  REC   LOCATION:  FOOT                         FACILITY:  [facility]   PHYSICIAN:  Theresia Majors. Tanda Rockers, M.D.DATE OF BIRTH:  12/12/1950   DATE OF CONSULTATION:  06/28/2007  DATE OF DISCHARGE:                                 CONSULTATION   REFERRING PHYSICIAN:  Margaretmary Bayley, M.D.   SUBJECTIVE:  Mr. Bartles is a 60 year old man who has been referred by  Dr. Margaretmary Bayley for evaluation of an ulceration involving the right  foot.   IMPRESSION:  Wagner grade III diabetic foot ulcer.   RECOMMENDATIONS:  The wound was debrided in the wound center.  We will  proceed with an x-ray of the foot to rule out concurrent osteomyelitis.  We will hold antibiotics pending culture results.  The patient will be  fitted with a modified healing sandal to offload the first met head.  He  has been instructed in antiseptic soap cleansing of the wound on a daily  basis.  We will reevaluate him in 1 week and will consider, at that  time, placing him in a total-contact cast for complete offloading.  If  there is any hesitancy at improvement of this wound, we will proceed  with hyperbaric oxygen treatment as an adjunct.   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  His sugars have been reasonably controlled  in the past.  He has had an ulceration over the volar aspect of the  right foot for the past 2 months.  He has noted some serosanguineous  drainage on the sock but has not complained of fever, redness or  excessive pain.  He continues to be ambulatory.  He has no history of  claudication.  He is a nonsmoker.   PAST MEDICAL HISTORY:  Is remarkable for hypertension and coronary  artery disease.   OVER-THE-COUNTER MEDICATIONS INCLUDE:  Aspirin, fish oil, multiple  vitamins.   He reports allergies to PENICILLIN which gives him a rash.   HIS  PRESCRIPTION MEDICATION INCLUDE:  1. Isosorbide mononitrate 60 mg one a day.  2. Metoprolol 50 mg at bedtime.  3. Zetia 10 mg a da.  4. Crestor 20 mg day.  5. Plavix 75 mg a day.  6. Amitriptyline 75 mg.  7. Amlodipine besylate 5 mg a day.  8. Spirolactone 25 mg.  9. Ramipril 10 mg 2 a day.   PAST SURGERY HAS INCLUDED:  Multiple coronary artery stents and an  appendectomy.  He has also had a lumbar laminectomy.   FAMILY HISTORY:  Is positive for heart disease.  Negative for cancer or  stroke.  Positive for diabetes.   SOCIALLY:  He is married.  He lives in [place].  He operates a  TEFL teacher.   REVIEW OF SYSTEMS:  He has never smoked.  He denies hemoptysis,  transient visual changes, paralysis or stigmata of TIA.  His appetite is  good.  His weight has been stable.  He has had some 2-3  pound weight  gain over the last year.  His work keeps him active.  He does have  exertional angina but this has proven not to be incapacitating.  There  are no GI or GU complaints.  There is no heat or cold intolerance,  polydipsia or polyuria.  The remainder of the review of systems is  negative.   ON PHYSICAL EXAM:  He is 5 feet 9 inches tall, weighs 250 pounds.  HEENT EXAM:  Is clear.  NECK:  Is supple.  Trachea is midline.  Thyroid is nonpalpable.  LUNGS:  Clear.  The heart sounds are distant.  ABDOMEN:  Is soft.  EXTREMITY EXAM:  Is remarkable for bilateral 1+ edema.  On the right  hallux is a punched out ulcer over the volar midportion of the distal  phalanx.  There is a prominent halo of callus and hyperemia.  There is a  mild stench consistent with mixed flora.  This wound probes down to the  joint.  There is marked friability.  The halo of callus was excisionally  debrided along with reactive subcutaneous tissue.  A culture was taken.  Hemorrhage was controlled with direct pressure.  A #10 blade and forceps  was used to effect the debridement.  The patient tolerated the  procedure  well.  He is insensate as judged by the Semmes-Weinstein filament.  There are bounding dorsalis pedis pulses on the left and questionable  pulse on the right dorsalis pedis.  The ABIs performed in the wound  center were normal.  There is no evidence of ascending infection or  cellulitis.   DISCUSSION:  Mr. Daoust has a Wagner grade III diabetic foot ulcer.  There is a question of whether or not there is an associated  osteomyelitis.  There is no evidence of associated ischemia or ascending  infection.  We have explained the concept of offloading to Mr. Laduca  in terms that he seems to understand.  We have emphasized the necessity  of aggressive management of this wound which will likely include  antibiotics and potentially a total-contact cast with hyperbaric oxygen  treatment.  We have given him an opportunity to ask questions.  He seems  to understand and expresses gratitude for having been seen in the  clinic.      Harold A. Tanda Rockers, M.D.  Electronically Signed     HAN/MEDQ  D:  06/28/2007  T:  06/28/2007  Job:  [id number]   cc:   Margaretmary Bayley, M.D.

## 2010-08-19 NOTE — Assessment & Plan Note (Signed)
[facility]                            CARDIOLOGY OFFICE NOTE   NAME:[name]                     MRN:          [id number]  DATE:09/20/2006                            DOB:          [birth year]    PRIMARY CARE PHYSICIAN:  Dr. Shaune [name].   ENDOCRINOLOGIST:  Dr. Margaretmary [name].   CLINICAL HISTORY:  [name] is 60 years old and has coronary heart disease  and has had multiple percutaneous interventions including stenting of  the LAD and stenting of the mid and distal right coronary [name]. He had  developed restenosis at the distal edge of the stent and had an  unsuccessful attempt at angioplasty of this severely calcified tortuous  vessel in February 2007. He also had total occlusion of 2 posterolateral  branches of the right coronary [name]. We have been managing medically  and I last saw him 4 months ago and he has done fairly well since that  time. He does have some angina if he hurries and does too much but he  feels overall it is slightly better than it was 4 months ago. He has had  no palpitations.   PAST MEDICAL HISTORY:  Significant for diabetes and hyperlipidemia. He  was recently put on insulin for his diabetes.   CURRENT MEDICATIONS:  1. Altace.  2. Lantus insulin.  3. Aspirin.  4. Norvasc.  5. Imdur.  6. Plavix.  7. Furosemide.  8. Crestor.  9. Spironolactone.  10.Zetia.  11.Fish oil.  12.Janumet.  13.Amitriptyline.  14.Toprol.  15.He was recently switched from Lipitor to Crestor because of muscle      aches.   PHYSICAL EXAMINATION:  VITAL SIGNS:  The blood pressure is 115/80 and  the pulse 77 and regular.  NECK:  There was no vein distention. Carotid pulses were full and  without bruits.  CHEST:  Clear without rales or rhonchi.  HEART:  Rhythm was regular. I could hear no murmurs or gallops.  ABDOMEN:  Soft without organomegaly.  EXTREMITIES:  Peripheral pulses were full and there was no peripheral  edema.   IMPRESSION:  1. Chronic stable angina somewhat improved.  2. Coronary [name] disease status post multiple percutaneous coronary      interventions as described above.  3. Good left ventricular function.  4. Diabetes.  5. Hyperlipidemia.   RECOMMENDATIONS:  I think [name] is doing fairly well. He is still having  some angina, his pulse rates are not optimum. He said his pulse rates  run in the 80s and sometimes occasionally in the 90s at rest. I will  plan to increase his Toprol from 100 in the morning and 25 in the  afternoon to 100 in the morning and 50 in the afternoon. If his pulse  rates still run over 70, I told him to increase them to 100 in the  morning and 75 in the afternoon. Some of his symptoms are at night so we  will increase his Imdur from 30 to 60 which he takes in the evening and  have him take it after his evening meal rather than right before  he goes  to bed. I will plan to see him back in followup in 4 months.     [name] Elvera Lennox Juanda Chance, MD, [name]  Electronically Signed    BRB/MedQ  DD: 09/20/2006  DT: 09/21/2006  Job #: [id number]

## 2010-08-22 NOTE — Cardiovascular Report (Signed)
[name], [name]              ACCOUNT NO.:  [id number]   MEDICAL RECORD NO.:  [id number]          PATIENT TYPE:  INP   LOCATION:  6523                         FACILITY:  [facility]   PHYSICIAN:  Charlies Constable, M.D. [facility] DATE OF BIRTH:  10/02/1950   DATE OF PROCEDURE:  06/02/2005  DATE OF DISCHARGE:                              CARDIAC CATHETERIZATION   PROCEDURE:  Percutaneous coronary intervention.   CLINICAL HISTORY:  [name] is 60 years old and has diabetes and known  coronary disease. He has had previous stenting of the LAD and has had  previous stenting of the mid and distal right coronary artery. His last  intervention was in October of 2005 at which time he had PTCA of a  restenotic lesion at the distal edge of his distal stent in the right  coronary artery. Due to calcified tortuous vessel, we were unable to get a  stent to the lesion and did balloon angioplasty only. He recently developed  recurrent symptoms and was stented yesterday in the outpatient laboratory  and found to have a new 90% to 95% lesion in the posterior descending branch  of the right coronary artery. The lesion in the distal right coronary artery  at the distal edge of the distal stent had not restenosed. We kept him  yesterday with plans for intervention today.   PROCEDURE:  We knew the procedure would be difficult. We initially went in  with a 7-French AL2 catheter to provide extra guide support. The patient was  given Angiomax bolus and infusion and was enrolled in the [place] trial and  was randomized to Plavix load versus Champion study drug. We initially  navigated a PT2 light support wire down the vessel and across the lesion  with a moderate amount of difficulty. We then attempted to pass a 2.25 x 12  Maverick but were unable to advance this around the bend in the posterior  descending branch. We then passed a buddy wire down the vessel to allow  better support. With multiple attempts, we were  not able to cross the lesion  with the buddy wire and had to leave it short of the lesion. We used  multiple wires including another PT2 and a whisper wire. We attempted to  cross the lesion with multiple balloons. We used a 1.5 mm 12 Voyager  and  Maverick both monorail and over the wire, but we were unable to cross the  lesion. We finally had to abandon further attempts. The procedure lasted  more than 2 hours. Despite the length of the procedure, the patient  tolerated the procedure well and left the laboratory in stable condition  with continued TIMI flow in the distal right coronary artery.   CONCLUSION:  Unsuccessful attempt at percutaneous coronary intervention of  the lesion in the posterior descending branch of the right coronary artery  due to inability to cross with the balloon because of a markedly tortuous  and calcified proximal mid and distal right coronary artery and a very tight  lesion.   DISPOSITION:  We will plan initial attempts at medical therapy. He  is on a  beta blocker, but he is not on a calcium channel blocker or nitrites. We  will begin by adding Norvasc 5 mg daily and then add Imdur. If he does not  respond to this, then I think we can use Ranexa. If he breaks through  medical therapy, then we have an option of either surgery or another attempt  at intervention. If we do another attempt at intervention, we would use a  Voda right guiding catheter to provide extra support, probably 7-French. We  would probably first attempt to pass a Rota-floppy wire and rotoblade the  lesion since it does have moderate calcium and we may get a suboptimal  result with a  balloon. However, it may be difficult to get a Rota-floppy wire across the  lesion since we had a great deal of difficulty getting a second wire across  after we had the initial PT2 moderate support across the lesion. I will  discuss these options with the patient and his wife.            ______________________________  Charlies Constable, M.D. [facility]     BB/MEDQ  D:  06/02/2005  T:  06/03/2005  Job:  [id number]   cc:   [name], M.D.  Fax: [phone]   Charlies Constable, M.D. [facility]  [address]  [address]  [place]  [place] [address]   Cardiopulmonary Lab

## 2010-08-22 NOTE — Discharge Summary (Signed)
NAMENEEDHAM, BIGGINS              ACCOUNT NO.:  [id number]   MEDICAL RECORD NO.:  [id number]         PATIENT TYPE:  INP   LOCATION:  6523                         FACILITY:  [facility]   PHYSICIAN:  Charlies Constable, M.D. [facility] DATE OF BIRTH:  05/16/1950   DATE OF ADMISSION:  06/01/2005  DATE OF DISCHARGE:  06/03/2005                                 DISCHARGE SUMMARY   REASON FOR ADMISSION:  Angina pectoris.   DISCHARGE DIAGNOSES:  1.  Coronary artery disease.      1.  Status post unsuccessful attempt at percutaneous coronary          intervention of the posterior descending artery on June 01, 2005.      2.  History of stenting of the LAD and mid RCA and PTCA of the distal          RCA, followed by rotational atherectomy and stenting for restenosis.      3.  PTCA in October of 2005 of the RCA (unable to get the stent down the          lesion).      4.  Ejection fraction 60%.  2.  Diabetes mellitus type 2.  3.  Hypertension.  4.  Hyperlipidemia.  5.  Obesity.  6.  Family history of coronary artery disease.   ALLERGIES:  Penicillin.   PROCEDURE PERFORMED DURING THIS ADMISSION:  1.  Cardiac catheterization by Dr. Charlies Constable on June 01, 2005.      Please see the dictated note for complete details.  Findings:  LAD 40% proximal, less than 10% at stent site, 40% distal  circumflex, 40% ostial, 40% distal RCA, less than 10% at mid stent, less  than 10% at distal stent, 40% at PTCA site, 95% in PDA, LV normal, with an  EF of 60%.  1.  Attempted PCI of the PDA on June 02, 2005: Unsuccessful. Please the      dictated note for complete details.   HISTORY OF PRESENT ILLNESS:  Mr. Zechman is a very pleasant 60 year old  patient followed by Dr. Juanda Chance, who presented to the office on May 27, 2005 with recurrent symptoms of his angina. He was placed on the schedule  for outpatient cardiac catheterization.   HOSPITAL COURSE:  The patient was admitted to [facility] on  June 01, 2005 and underwent diagnostic catheterization by Dr. Juanda Chance.  The findings are as noted above. He tolerated the procedure well, without  any complications.   On June 02, 2005, he went for attempted PCI of the PDA of the RCA. This  was unsuccessful. It was decided to try medical therapy at this point. If  refractory symptoms consideration could be made for another attempt at PCI  versus CABG.   Dr. Juanda Chance saw the patient on June 02, 2005. He was stable. His right  femoral artery site was okay. It was felt that he could be discharged to  home on the same medications, with the addition of Norvasc 5 mg daily.   LABORATORY DATA:  White  count 5700, hemoglobin 13.8, hematocrit 39.4,  platelets count 187,000. INR 1.0. Sodium 136, potassium 3.8, chloride 102,  CO2 25, glucose 134, BUN 12, creatinine 1.2, LFTs okay, total protein 5.9,  albumin 8.4, calcium 8.3. Post procedure, CK MB 2.4, troponin 0.09. Lipids:  Total cholesterol 204, triglycerides 356, HDL 36, LDL 97. Chest x-ray: Mild  cardiomegaly, no active lung disease.   FOLLOW UP:  Will be with Dr. Juanda Chance on June 17, 2005 at 8:30 a.m.   DISCHARGE MEDICATIONS:  1.  Norvasc 5 mg daily.  2.  Altace 10 mg twice daily.  3.  Toprol XL 100 mg daily.  4.  Lipitor 10 mg q.h.s.  5.  Amitriptyline 10 mg 3 tabs at bedtime.  6.  Enteric coated aspirin 325 mg daily.  7.  Multivitamin daily.  8.  Plavix 75 mg daily.  9.  Metformin 1 gram b.i.d.- the patient has been advised to refrain from      taking this until June 06, 2005.  10. Lasix 40 mg daily.  11. __________ 4 mg twice daily.  12. Glimepiride 4 mg daily.  13. Nitroglycerin p.r.n. chest pain.   INSTRUCTIONS:  The patient has been instructed that he can return to work on  March 7. He is to increase his activities slowly. He should watch his right  femoral artery site for any swelling, bleeding or bruising and call us. He  should also call if he develops  any fever.   DURATION OF DISCHARGE:  Less than 30 minutes.      Tereso Newcomer, P.A.    ______________________________  Charlies Constable, M.D. [facility]    SW/MEDQ  D:  06/03/2005  T:  06/03/2005  Job:  [id number]   cc:   Ramon Dredge L. Juanetta Gosling, M.D.  Fax: [phone]

## 2010-08-22 NOTE — Cardiovascular Report (Signed)
NAME:  FELICIANO, WYNTER NO.:  [id number]   MEDICAL RECORD NO.:  [id number]                   PATIENT TYPE:  OIB   LOCATION:  6522                                 FACILITY:  [facility]   PHYSICIAN:  Charlies Constable, M.D.                  DATE OF BIRTH:  03-25-1951   DATE OF PROCEDURE:  01/16/2003  DATE OF DISCHARGE:                              CARDIAC CATHETERIZATION   CLINICAL HISTORY:  Raffi Milstein is 60 years old, is diabetic and has  previously known coronary disease.  He has had a previous stenting and PTCA  of the mid and distal LAD in 2001.  He recently developed recurrent  exertional angina, and we brought him for a re-evaluation with angiography.   PROCEDURE:  The procedure was performed via the right femoral artery using  an arterial sheath and 6 French preformed coronary catheters.  A front wall  arterial puncture was performed and Omnipaque contrast was used.  After  completion of the diagnostic study, we made the decision to proceed with  intervention on the complex lesions in the right coronary artery.   The patient was enrolled in the Steeple trial and was randomized to heparin  and we choose to give Integrelin with this.  We used an AL-1, 6 [place]  guiding catheter with side holes and a PT-2 light support wire.  We crossed  the lesions in the mid and distal right corner with a moderate amount of  difficulty.  We predilated the mid lesion with a 2.25 x 15-mm Maverick, but  we were unable to cross the distal lesion with the same balloon.  We  exchanged for a 2 x 15-mm Maverick and were able to cross the lesion and  perform several inflations up to 15 atmospheres for 30 seconds.  We then  passed a 2.5 x 15-mm Quantum Maverick across the blockage with some  difficulty.  We performed several inflations up to 22 atmospheres for 30  seconds.  We did not quite completely eliminate the waste in the balloon.  The lesion was heavily calcified.   We then  made the decision to stent the lesion in the mid right coronary  artery.  We used a 3 x 16-mm TAXUS stent, deployed this with one inflation  of 15 atmospheres for 30 seconds.  We then postdilated with 3.75 x 12-mm  Quantum Maverick performing two inflations up to 15 atmospheres for 30  seconds.  Repeat diagnostic study was then performed through the guiding  catheter.   The patient tolerated the procedure well and left the laboratory in  satisfactory condition.   RESULTS:  The outer pressure was 135/89 with a mean of 109, left ventricular  pressure is 135/11.   CORONARY ANGIOGRAPHY:  1. Left main coronary artery:  Free of significant disease.  2. Left anterior descending artery:  Left anterior descending artery gave     rise to  large septal perforator and diagonal branch.  There was 50%     narrowing in proximal mid vessel.  There was less than 20% narrowing at     the stent site in the mid vessel.  There was 70% narrowing in the distal     vessel after the diagonal branch with diffuse disease distally.  3. Circumflex artery:  The circumflex artery gave rise to a large marginal     branch, a small marginal branch and two posterolateral branches.  There     was a 70% narrowing in the A-V branch after the first marginal branch and     there were irregularities in the other vessels.  4. Right coronary artery.  The right coronary artery is a moderate sized     vessel and gives rise a posterior descending branch and it was completely     occluded.  There were two posterior lateral branches filled by     collaterals.  There was a 90% stenosis in the mid vessel that was located     between two greater than 90 degree bends.  There was a 95% lesion in the     distal vessel that was located at about a 60 degree bend.   LEFT VENTRICULOGRAM:  The left ventriculogram performed in the RAO  projection showed good wall motion with no areas of hypokinesis.  The  estimated ejection fraction was  60%.   INTERVENTION:  Following PTCA of the lesion of the distal right coronary  artery, the stenosis improved from 95% to 30%.  Following stenting of the  lesion in the mid right coronary artery, the stenosis improved from 90% to  less than 10%.   CONCLUSION:  1. Coronary artery disease status post prior percutaneous coronary imaging     as described above with 30% narrowing in the proximal mid left anterior     descending artery, less than 20% narrowing at the stent site in the mid     left anterior descending artery, 70% narrowing in the distal left     anterior descending artery, 70% narrowing in the mid circumflex RA, 90%     mid and 95% distal stenosis in the right coronary artery with total     occlusion of the posterior lateral branch of the right coronary artery     and 6% narrowing in the posterior descending branch of the right coronary     artery with normal left ventricular function.  2. Successful percutaneous transluminal coronary angioplasty of the lesion     of the distal right coronary artery and improvement in center of     narrowing from 95% to 30%.  3. Successful stenting of the lesion in the mid right coronary artery with a     TAXUS stent with improvement in center of narrowing from 90% to less than     10%.   DISPOSITION:  The patient was returned to post anesthesia unit for further  observation.  The distal lesion was very difficult due to heavy  calcification.  If he has recurrence then we might consider treatment with  rotational atherectomy which may help improve the compliance and allow for  stenting.                                               Charlies Constable, M.D.  BB/MEDQ  D:  01/16/2003  T:  01/17/2003  Job:  [id number]   cc:   Ramon Dredge L. Juanetta Gosling, M.D.  [address]  [place]  [place] [address]  Fax: [phone]   Cardiopulmonary Lab

## 2010-08-22 NOTE — Cardiovascular Report (Signed)
[name] NO.:  [id number]   MEDICAL RECORD NO.:  [id number]          PATIENT TYPE:  INP   LOCATION:  6523                         FACILITY:  [facility]   PHYSICIAN:  [name], M.D. [facility] DATE OF BIRTH:  1951/01/30   DATE OF PROCEDURE:  06/01/2005  DATE OF DISCHARGE:                              CARDIAC CATHETERIZATION   CLINICAL HISTORY:  [name] is 60 years old and is self-employed as a  Music therapist. He has diabetes and known coronary disease. He has had previous  stenting of the LAD and has previous stenting in the mid and distal right  coronary artery. His last intervention was in October of 2005, at which time  he had PTCA for restenotic lesion just outside the stent in the distal right  coronary artery. This was a very difficult procedure due to calcification  and tortuosity in the vessels. We were able to get a balloon down to the  lesion and got a fairly good result with the balloon but were unable to get  a stent to the lesion. We tried an AL-2 guiding catheter and buddy wires and  a short VISION stent but were still unable to get the stent down to the  lesion. Despite this, he did well until the last several weeks when he  developed recurrent chest pain.  This weekend he had chest pain with minimal  activity. He was seen recently by Wende Bushy and myself in the office  for evaluation with angiography in the outpatient laboratory.   PROCEDURE:  The procedure was performed via the right femoral arteries and  arterial sheath and 4-French preformed coronary catheters. A front wall  arterial puncture was performed and Omnipaque contrast was used. The patient  tolerated the procedure well and left the lab in satisfactory condition.   RESULTS:  Left main:  The left main coronary artery was free of significant  disease.   Left anterior descending artery:  Left anterior descending artery gave rise  to septal perforator and a small and larger  diagonal branch. There was 40%  narrowing in the proximal to mid LAD. There was less than 10% narrowing at  the stent site in the mid LAD. There was 40% narrowing in the distal LAD  after the second diagonal branch and there were diffuse irregularities in  the distal vessel.   Circumflex artery:  The circumflex artery gave rise to a moderately large  marginal branch and atrial branch, a second marginal branch and two  posterolateral branches. There was 40% ostial stenosis. There was 40% mid  stenosis after the second marginal branch.   Right coronary artery:  The right coronary artery was a moderate size vessel  that gave rise to a right ventricular branch, a posterior descending branch  and then was completely occluded. The vessel was heavily calcified. There  was less than 10% narrowing at the stent site in the proximal right coronary  artery. There was less than 10% narrowing at the stent site in the distal  right coronary. There was 40% narrowing just distal to the distal stent at  the PTCA site right before the ostium of the posterior descending branch.  The right coronary artery was completely occluded after the posterior  descending branch which was old. There was a new 95% stenosis in the  proximal to mid portion of the posterior descending branch. Two fairly large  posterolateral branches filled via collaterals from left coronary system.   Left ventriculogram:  The left ventriculogram performed in the RAO  projection showed good wall motion with no areas of hypokinesis.  The  estimated ejection fraction was 60%.   The aortic pressure was 100/64 with a mean of 80. Left ventricular pressure  was 100/11.   CONCLUSION:  Coronary artery disease, status post multiple percutaneous  interventions as described above with 40% proximal and 40% distal stenosis  in the left anterior descending with less than 10% stenosis at the stent  site in the mid left anterior descending, 40% ostial  and 40% mid stenosis in  the circumflex artery, less than 10% stenoses in the proximal and distal  stents in the right coronary artery and 40% narrowing at the percutaneous  transluminal coronary angioplasty site in the distal right coronary artery  distal to the distal stent with total occlusion of the posterolateral branch  (old) and 95% stenosis in the proximal to mid portion of the posterior  descending branch (new) with a normal left ventricular function.   RECOMMENDATIONS:  The patient has not developed any restenosis in the distal  right coronary artery but has a new tight lesion in the posterior descending  branch which I believe is the culprit lesion. His anatomy otherwise appears  unchanged. PCI of this vessel may be difficult due to access problems, but I  think we will at least be able to treat this with balloon angioplasty and I  think this would be the best option. Will probably arrange to do this  tomorrow and I will discuss with the patient regarding admission versus  going home and coming back tomorrow for the procedure.           ______________________________  [name], M.D. [facility]     BB/MEDQ  D:  06/01/2005  T:  06/01/2005  Job:  [id number]   cc:   Ramon Dredge L. Juanetta Gosling, M.D.  Fax: [phone]   Margaretmary Bayley, M.D.  Fax: [phone]   Cardiopulmonary Lab

## 2010-08-22 NOTE — Op Note (Signed)
   NAME:  [name], [name]                        ACCOUNT NO.:  [id number]   MEDICAL RECORD NO.:  [id number]                   PATIENT TYPE:  EMS   LOCATION:  ED                                   FACILITY:  [facility]   PHYSICIAN:  Dirk Dress. Katrinka Blazing, M.D.                DATE OF BIRTH:  1950-12-08   DATE OF PROCEDURE:  DATE OF DISCHARGE:                                 OPERATIVE REPORT   PREOPERATIVE DIAGNOSIS:  Acute appendicitis.   POSTOPERATIVE DIAGNOSIS:  Acute appendicitis.   PROCEDURE:  Laparoscopic appendectomy.   SURGEON:  Dirk Dress. Katrinka Blazing, M.D.   DESCRIPTION OF PROCEDURE:  Under general anesthesia the patient's abdomen  was prepped and draped in a sterile field.  A supraumbilical midline  incision was made.  A Veress needle was inserted uneventfully.  The abdomen  was insufflated with 3 liters of CO2.  Using a Vis-A-Port guide a 10-mm port  was placed.  A laparoscope was placed.   Under videoscopic guidance a 5-mm port was placed into the suprapubic  midline and a 12-mm port in the left lower quadrant.  Using blunt dissection  the appendix was discovered lying against the right lateral pelvic wall.  There were some acute-and-chronic adhesions.  The acute adhesions were  dissected bluntly. The chronic adhesions were taken down with dissecting  scissors.  The appendix was grasped.  The vessels of the mesoappendix were  serially clamped with hemoclips.  They were divided.  This was continued  down to the base of the appendix.   The base of the appendix was transected using a standard Endo-GI stapler.  Inspection of the mesoappendix and the base of the appendix was carried out  and appeared to be unremarkable.  Three liters of irrigation was used to  make sure that there was no bleeding. None was identified.  The patient  tolerated the procedure well.  A JP drain was placed and brought out through  the suprapubic midline incision.  It was secured with 3-0 Nylon.  The fascia  of the  larger incision was closed with #0 Dexon.  The skin was closed with  staples.  Sterile dressings were placed. He  was awakened from anesthesia  uneventfully, transferred to a bed, and taken to the postanesthetic care  unit for monitoring.                                               Dirk Dress. Katrinka Blazing, M.D.    LCS/MEDQ  D:  08/08/2002  T:  08/08/2002  Job:  [id number]

## 2010-08-22 NOTE — Cardiovascular Report (Signed)
[name]. [name]  Patient:    [name], [name]                     MRN: [id number] Proc. Date: 01/12/00 Adm. Date:  [id number] Disc. Date: [id number] Attending:  Lenoria Farrier CC:         [name], M.D.             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  He has multiple risk factors for coronary disease, including a very strong positive family history, hyperlipidemi,a hypertension and newly diagnosed diabetes.  He has had some chest tightness which has been non-exertional and not very typical for angina. We evaluated him with a Cardiolite scan which showed inferior ischemia and he was scheduled for evaluation with catheterization.  PROCEDURE:  Procedure was performed via the right femoral artery using arterial sheath and 6-French preformed coronary catheters.  A femoral arterial puncture was performed and Omnipaque contrast was used.  Distal aortogram was performed to rule out abdominal aortic aneurysm.  A subclavian injection was performed to assess the internal mammary artery for suitability for bypass grafting.  The patient tolerated the procedure well and left the laboratory in satisfactory condition.  RESULTS:  Aortic pressure was 1100/75, left ventricular pressure was 110/11.  ANGIOGRAPHY: 1.  LEFT MAIN:  The left main coronary artery was free of significant disease. 2.  LEFT ANTERIOR DESCENDING ARTERY:  The left anterior descending artery gave     rise to a small diagonal branch, a large septal perforator and another     diagonal branch in its distal portion.  There was 70% mid stenosis.  There     was another 90% mid distal stenosis and a 90% distal stenosis. 3.  CIRCUMFLEX ARTERY:  The circumflex artery gave rise to a marginal branch,     a second marginal branch and two  posterolateral branches.  There was 50%     ostial stenosis of circumflex artery.  There was 40% narrowing in the     marginal branch. 4.  RIGHT CORONARY ARTERY:  The right coronary artery was a moderately large     sized vessel that gave rise to two right ventricular branches, a posterior     descending branch and then was completely occluded.  There was 70%     narrowing in its mid portion and 40-50% in its distal portion.  The     distal two posterolateral branches were filed via collaterals from the     circumflex artery.  LEFT VENTRICULOGRAM:  The left ventriculogram in the RAO projection showed good wall motion with no evidence of hypokinesis.  The estimated ejection fraction was 55%.  SUBCLAVIAN INJECTION:  A subclavian injection was performed which showed patent vertebral and internal mammary arteries.  DISTAL AORTOGRAM:  A distal aortogram was performed which showed patent renal arteries and no significant aortic obstruction.  CONCLUSION:  Coronary artery disease with 70% mid and tandem 90% distal stenosis in the LAD, 50% ostial stenosis in the circumflex artery with 40% narrowing in the first marginal branch, and 50% distal stenosis in the right coronary artery with total occlusion of the distal right coronary artery after  the posterior descending branch with with filling of the posterolateral branches by collaterals and normal LV function.  RECOMMENDATIONS:  The management is not clear.  [name] has symptoms that are somewhat atypical and he does have ischemia in the inferior wall on Cardiolite scan.  The distal right coronary artery is not suitable for percutaneous interventions.  Our options include bypass surgery, medical therapy or percutaneous revascularization of the LAD, combined with medical therapy.  I will review these with my colleagues before making final recommendation.  We will probably let him go home today and come back to the office later this week  for further discussions.  ADDENDUM:  We closed the right femoral artery with Perclose and we gave vancomycin 1 g, as associated treatment since he is diabetic. DD:  01/12/00 TD:  01/12/00 Job: [id number]

## 2010-08-22 NOTE — Discharge Summary (Signed)
NAME:  [name], [name]                        ACCOUNT NO.:  [id number]   MEDICAL RECORD NO.:  [id number]                   PATIENT TYPE:  OIB   LOCATION:  6522                                 FACILITY:  [facility]   PHYSICIAN:  Charlies Constable, M.D.                  DATE OF BIRTH:  [birth year]   DATE OF ADMISSION:  01/16/2003  DATE OF DISCHARGE:  01/17/2003                           DISCHARGE SUMMARY - REFERRING   DISCHARGE DIAGNOSES:  1. Chest pain status post stent placement to mid right coronary artery with     an angioplasty to the distal right coronary artery.  2. Diabetes mellitus, oral agent.  3. Hypertension, treated.  4. Hyperlipidemia, treated.  5. Peripheral neuropathy.  6. Status post appendectomy.  7. Status post lumbar spine surgery.   HOSPITAL COURSE:  [name] is a 60 year old male patient with an end  history of coronary artery disease.  He has a history of a previous stent  placement to the LAD with a PTCA to the distal LAD.  This was performed  around October of 2001 and since that time he has done well.  He presented  to the office on January 09, 2003 complaining of substernal chest burning  with exertion.  It had been escalating over the last 3 weeks.  He was  planned for admission for outpatient cardiac catheterization.  This  procedure occurred on January 16, 2003 and he was found to have the  following: LAD with a 50% proximal, less than 20% mid at the stent site and  70% distal lesion; circumflex 70% mid; RCA 90% mid, 95% distal with a total  PL lesion; LV was normal.  At that point the patient underwent a stent  (TAXUS Express II) monorail (stent) to the mid RCA with reduction in the 90%  stenosis to less than 10% postprocedure.  Angioplasty then was performed to  the distal RCA producing a 95% lesion to a 30% postprocedure.  The patient  tolerated the procedure well and remained overnight for observation.   Upon discharge the patient's vitals were stable.   He was felt ready for  discharge to home.  Lab studies included normal cardiac isoenzymes.  BMET:  Sodium 135, potassium 3.9, BUN 10, creatinine 1.2.  Hemoglobin 16.0,  hematocrit 40.5, platelets 163, white count 6.1.   DISCHARGE MEDICATIONS:  1. Enteric coated aspirin 325 mg a day.  2. Plavix 75 mg a day.  3. He is not to resume his Glucophage until Saturday, January 20, 2003.  4. He is to continue his other medications which include:     a. Lipitor 20 mg a day.     b. Multivitamins daily.     c. Vitamin E daily.     d. Amaryl 4 mg a day.     e. Amitriptyline 10 mg 3 tablets q.h.s.     f. Altace 20 mg a day.  g. Toprol 100 mg a day.     h. Sublingual nitroglycerin p.r.n.        a. Aleve p.r.n.    DISCHARGE INSTRUCTIONS:  1. The patient is discharged to home in stable condition.  2. He may utilize Tylenol 1-3 tablets q.6h. as needed for pain.  3. No strenuous activity or driving for 2 days, then gradually increase     activity.  4. Remain on a low fat diet.  5. Clean over the catheter site with soap and water.  6. Follow up with Dr. Regino Schultze PA on January 31, 2003 at 3 p.m. for groin     check.      Guy Franco, P.A. [facility]                      Charlies Constable, M.D.    LB/MEDQ  D:  01/17/2003  T:  01/17/2003  Job:  [id number]   cc:   Charlies Constable, M.D.   Edward L. Juanetta Gosling, M.D.  [address]  [place]  [place] [address]  Fax: [phone]

## 2010-08-22 NOTE — Assessment & Plan Note (Signed)
[facility]                            CARDIOLOGY OFFICE NOTE   NAME:[name], [name]                     MRN:          [id number]  DATE:05/31/2006                            DOB:          1950-08-24    PRIMARY CARE PHYSICIAN:  Ramon Dredge L. Juanetta Gosling, M.D.   ENDOCRINOLOGIST:  Margaretmary Bayley, M.D.   CLINICAL HISTORY:  [name] is 60 years old and has documented  coronary artery disease with previous stenting of the LAD and previous  stenting of the mid and distal right coronary artery.  He had  unsuccessful PCI in the posterior descending branch past a distal stent  due to inability to get a balloon and stent across the lesion.  He has  done reasonably well on medical therapy.  When I saw him in January, he  was still having a fair amount of symptoms including some nocturnal  symptoms, and we added Imdur at night.  His nocturnal symptoms have gone  and he is having about 4 episodes a week of chest pain.  He says usually  this happens when he exerts himself and he is in a hurry or stressed in  some way.  Usually the symptoms are very short lived and he does not  have time to take a nitroglycerin.   PAST MEDICAL HISTORY:  1. Diabetes.  2. Hyperlipidemia.   CURRENT MEDICATIONS:  Altace, aspirin, Norvasc, Imdur, Plavix,  furosemide, glimepiride, Crestor, spironolactone, Zetia, fish oil,  Janumet, Actos, and amitriptyline.   PHYSICAL EXAMINATION:  VITAL SIGNS:  His blood pressure is 127/87, the  pulse 78 and regular.  NECK:  There was no vein distention.  The carotid pulses were full  without bruits.  CHEST:  Clear without rales or rhonchi.  CARDIAC:  Rhythm was regular.  Heart sounds were normal.  I could hear  no murmurs or gallops.  ABDOMEN:  Protuberant.  The abdomen was soft.  There were normal bowel  sounds.  EXTREMITIES:  Peripheral pulses were equal and there was no pedal edema.   IMPRESSION:  1. Chronic stable angina, somewhat improved.  2. Coronary artery disease, status post multiple percutaneous      interventions as described above.  3. Good left ventricular function.  4. Diabetes.  5. Hyperlipidemia.   RECOMMENDATIONS:  I think Nadine Counts is doing better.  His pulse rate is still  somewhat fast.  He reminded me that when he went up to 150 of Toprol, he  got dizzy and had to cut back.  We may be able to go up to 125 and split  the dose and have him take 100 in the morning and 25 in the  evening.  We will see if this helps.  I will plan to see him back in 4  months or sooner if he has any recurrent problems.     Bruce Elvera Lennox Juanda Chance, MD, [facility]  Electronically Signed    BRB/MedQ  DD: 05/31/2006  DT: 05/31/2006  Job #: [id number]

## 2010-08-22 NOTE — Cardiovascular Report (Signed)
NAMEMARWAN, [name] NO.:  [id number]   MEDICAL RECORD NO.:  [id number]          PATIENT TYPE:  OIB   LOCATION:  2899                         FACILITY:  [facility]   PHYSICIAN:  Charlies Constable, M.D. [name] DATE OF BIRTH:  10-18-1950   DATE OF PROCEDURE:  01/29/2004  DATE OF DISCHARGE:                              CARDIAC CATHETERIZATION   CLINICAL HISTORY:  [name] is 60 years old and has diabetes and known  coronary disease.  He has had previous stenting of the LAD and he has had  stenting of the mid right coronary artery and PTCA of the distal right  coronary artery followed by rotational atherectomy and stenting of the  distal right coronary artery for restenosis.  He was studied last week in  the outpatient laboratory and found to have restenosis at the distal edge of  the distal stent in the right coronary artery.  We brought him back today  for intervention.   PROCEDURE:  The procedure was performed via the right femoral artery using  an AL1 and then an AL2 guiding catheter.  We gave Angiomax bolus and  infusion and an additional 300 mg of Plavix.  We knew the procedure would be  quite difficult due to a heavily calcified, tortuous vessel with difficulty  accessing the lesion.  We passed two wires down from the beginning using two  PT2 light support wires.  We then advanced a 2.25 x 15 mm Maverick balloon  down to the lesion in the distal right coronary artery which was located at  the distal edge of the distal stent.  We had difficulty getting the balloon  down, but were able to accomplish this.  We inflated to 12 atmospheres for  30 seconds, but did not get complete full expansion.  We attempted to pass a  2.5 x 8 mm Quantum Maverick down, but were unable to advance this down to  the lesion.  We then went back in with a 2.75 x 15 mm Maverick and were able  to get this down to the lesion and at 17 atmospheres the balloon expanded,  then broke the lesion.  We  then attempted to get multiple stents down.  We  tried to get a 2.5 x 8 mm Taxus and were unable to advance this past the mid  portion of the right coronary artery where there was marked tortuosity and  calcification.  We tried to deep throat the guiding catheter and the guiding  catheter came out and we switched from an AL1 to an AL2 to try and get  better support.  We still were unable to get the stent down.  We tried a 2.5  x 15 mm Vision and were unable to pass this past the mid portion of the  right coronary artery.  We then tried a 2.5 x 12 mm Liberte and were still  unable to get the balloon past the lesion.  Finally, we decided to settle  for a balloon result and we went back in with a 2.75 x 15 mm Maverick and  performed one inflation up  to 10 atmospheres for a minute and a half.  Repeat diagnostic study was then performed through the guiding catheter.   Despite the fact the procedure was long and difficult, the patient tolerated  the procedure well and left the laboratory in satisfactory condition.   RESULTS:  Initially, the stenosis in the distal right coronary artery was  estimated at 99%.  This was right at the juncture with the posterior  descending artery.  Following PTCA this improved to 20%.   CONCLUSIONS:  Successful PTCA of the lesion in the distal right coronary  artery with improvement in center of narrowing from 99% to 20% and inability  to get bare metal or drug-eluting stent down to the lesion.   DISPOSITION:  Patient returned to postanesthesia care unit for further  observation.  He will be at increased risk for restenosis and we will have  to give some consideration to surgery if he develops restenosis and  recurrent symptoms.       BB/MEDQ  D:  01/29/2004  T:  01/29/2004  Job:  [id number]   cc:   Ramon Dredge L. Juanetta Gosling, M.D.  [address]  [place]  [place] [address]  Fax: [phone]   [facility]

## 2010-08-22 NOTE — Op Note (Signed)
NAME:  [name], [name] NO.:  [id number]   MEDICAL RECORD NO.:  [id number]                   PATIENT TYPE:  OIB   LOCATION:  3009                                 FACILITY:  [facility]   PHYSICIAN:  Clydene Fake, M.D.               DATE OF BIRTH:  1950/05/08   DATE OF PROCEDURE:  12/01/2001  DATE OF DISCHARGE:  12/02/2001                                 OPERATIVE REPORT   PREOPERATIVE DIAGNOSIS:  Far lateral herniated nucleus pulposus, right L4-5.   POSTOPERATIVE DIAGNOSIS:  Far lateral herniated nucleus pulposus, right L4-  5.   PROCEDURE:  Far lateral diskectomy, right L4-5, with microdissection with  microscope.   SURGEON:  Clydene Fake, M.D.   ASSISTANT:  Stefani Dama, M.D.   ANESTHESIA:  General endotracheal tube anesthesia.   ESTIMATED BLOOD LOSS:  None.   BLOOD REPLACED:  None.   DRAINS:  None.   COMPLICATIONS:  None.   INDICATIONS:  The patient is a 60 year old right-handed gentleman who had  last month noted right severe pain radiating down his leg toward his ankle  with some numbness.  He has had some chronic peripheral neuropathy from his  diabetes.  _____________ MRI scan showing a far lateral disk herniation at 4-  5 on the right, and exam shows some right quadriceps weakness, 4+/5 in  strength, sensation decreased in the right L4 distribution and decreased  reflexes in the right knee.  The patient brought in for far lateral  diskectomy for decompression of the 4 root.   DESCRIPTION OF PROCEDURE:  The patient was brought in the operating room,  and general anesthesia was induced.  The patient was placed in a prone  position on the Wilson frame with all pressure points padded.  A needle was  placed in the back and an x-ray obtained showing this was at the 3-4  interspace.  Incision was then made 3 cm to the right of the midline in the  lumbar spine centered just below where the needle was placed.  The incision  was taken  down to the fascia and hemostasis obtained with Bovie  cauterization.  The fascia was incised with the Bovie and then blunt  dissection was taken through the paraspinous muscles down to the facet.  A  retractor was then placed, and we dissected out to the transverse processes  of L4 and L5, and a marker was placed in the interspace and x-ray obtained  showing this was at the 4-5 interspace.  A high-speed drill was then used  and removed the lateral edge of facet, and blunt dissection was taken down  the pedicle up to the disk space.  We saw the nerve root, which was pushed a  little inferiorly was normal and pushed posterior a little.  We dissected  out of that, but no free fragment was found.  A large disk bulge was there,  but superiorly there was a large calcified ridge and a subligamentous disk  herniation.  The disk space was incised and diskectomy performed with  pituitary rongeurs and curettes.  As we did that, a few free fragments of  disk were removed from under the L4 root.  There was a lot of calcified  annulus, some osteophytes, and we carefully dissected those and removed  those.  When we were finished, we had good decompression of the 4 root and  we could put a dilator medially into the epidural space.  The microscope was  used for microdissection during the drilling and diskectomy.  The wound was  irrigated with antibiotic solution, hemostasis obtained with Gelfoam and  thrombin, and this was then irrigated out.  Some Gelfoam was placed over the  4 root and Depo-Medrol was placed and a couple of pieces of Gelfoam were  placed over the L4 roots.  Retractors were removed.  The fascia was closed  with 0 Vicryl interrupted suture, the subcutaneous tissue was closed with 0,  2-0, and 3-0 Vicryl interrupted sutures, and the skin closed with Benzoin  and Steri-Strips.  A dressing was placed.  The patient was placed back into  a supine position, awoken from anesthesia, and returned to  the recovery room  in stable condition.                                               Clydene Fake, M.D.    JRH/MEDQ  D:  12/01/2001  T:  12/05/2001  Job:  [id number]

## 2010-08-22 NOTE — Discharge Summary (Signed)
NAME:  [name], [name]                        ACCOUNT NO.:  [id number]   MEDICAL RECORD NO.:  [id number]                   PATIENT TYPE:  OIB   LOCATION:  6525                                 FACILITY:  [facility]   PHYSICIAN:  Charlies Constable, M.D.                  DATE OF BIRTH:  [birth year]   DATE OF ADMISSION:  04/16/2003  DATE OF DISCHARGE:  04/17/2003                                 DISCHARGE SUMMARY   DISCHARGE DIAGNOSES:  1. Exertional angina.  2. A 95% distal right coronary artery stenosis with 100% total occlusion of     posterolateral branch in a patient with known coronary artery disease     with previous Taxus stent to the distal right coronary artery and a prior     stent to the mid left anterior descending artery.   SECONDARY DIAGNOSES:  1. History of coronary artery disease.  As above, status post stent to the     distal right coronary artery.  Status post stent to the mid left anterior     descending artery with residual disease in the circumflex and 70% mid     circumflex and 70% proximal first obtuse marginal as well as a 50%     proximal stenosis of the posterior descending coronary artery, and a 70%     distal stenosis of the left anterior descending artery.  2. Hypertension.  3. Diabetes.  4. Hyperlipidemia.  5. Preserved left ventricular function.   PROCEDURE:  On April 16, 2003, left heart catheterization with a right  femoral artery.  The LAD had a proximal 40% stenosis.  At mid point, there  was a 40% end-stent restenosis and there was a distal 70% stenosis.  The  left circumflex had a 70% stenosis at the first obtuse marginal ostially,  and a 70% mid point stenosis.  The right coronary artery had a less than 10%  end-stent restenosis at the prior stent.  There was a 95% distal stenosis  with placement of stent reducing the blockage from 95% to 10%.  The  posterolateral branch was totally occluded.  The distal PDA and  posterolateral branches are fed by  collaterals from the left circumflex.   DISCHARGE DISPOSITION:  Mr. Stanwood has tolerated the procedure well.  Catheterization site is without complication.  He has good perfusions of the  right lower extremity.  There is no evidence of bruit.  His mental status is  clear.  He has had no further chest pain.  He is ready for discharge on  January 11th.  He is to go home on the following medications:   DISCHARGE MEDICATIONS:  1. Enteric-coated aspirin 325 mg daily.  2. Plavix 75 mg daily.  3. Altace 10 mg twice daily.  4. Toprol XL 100 mg daily.  5. Lipitor 20 mg daily at bedtime.  6. Amitriptyline 30 mg daily at bedtime.  7. Amaryl 4 mg  daily.  8. Multivitamin daily.  9. Nitroglycerin 0.4 mg 1 tablet under the tongue every 5 minutes as needed     for chest pain.  10.      Glucophage 1000 mg twice daily.  He is to hold on taking this until     Thursday, January 13th, then restart.  11.      For pain, Tylenol 325 mg 1-2 tabs every 4-6 hours as needed for     pain.   He is asked not to drive for the next two days.  He is not to lift any heavy  weights for the next week.  No sexual intercourse for the next two days.   DISCHARGE DIET:  Low sodium, low cholesterol, ADA diet.   He may shower.   Call [phone] if he experiences swelling or increased pain of the  catheterization site.  He has a followup with Dr. Juanda Chance on Wednesday,  January 19th at 4:30 p.m.   BRIEF HISTORY:  The patient is 60 years old.  He has known coronary artery  disease.  He is status post stenting and PTCA of the distal right coronary  artery.  About four months prior to this, a Taxus stent was placed.  The  vessel was heavily calcified, and a second stent was not placed at that  time.  The patient returned for followup at the end of December, 2004 and  has developed some burning chest pain with exertion similar to what he  experienced prior to stent implantation.  He also has some yawning which  also is  previous to similar symptoms.  He has had no rest pain.  Patient's  history is significant for hypertension, diabetes, hyperlipidemia.  As  regards to the right coronary artery, it is very likely a restenosis, most  likely at the PTCA angioplasty site.  The stent was not placed in the prior  visit because the vessel was heavily calcified, and  component balloon  expansion was not obtained; however, he has developed recurrence, and we  probably want to treat this first with rotational atherectomy then stenting.  He was to come into the hospital for catheterization on Monday, January  10th.   HOSPITAL COURSE:  As described in the discharge disposition.      Maple Mirza, P.A.                    Charlies Constable, M.D.    GM/MEDQ  D:  04/17/2003  T:  04/17/2003  Job:  [id number]   cc:   Ramon Dredge L. Juanetta Gosling, M.D.  [address]  [place]  [place] [address]  Fax: [phone]

## 2010-08-22 NOTE — Cardiovascular Report (Signed)
NAME:  [name] NO.:  [id number]   MEDICAL RECORD NO.:  [id number]                   PATIENT TYPE:  OIB   LOCATION:  2899                                 FACILITY:  [facility]   PHYSICIAN:  Charlies Constable, M.D.                  DATE OF BIRTH:  October 27, 1950   DATE OF PROCEDURE:  04/16/2003  DATE OF DISCHARGE:                              CARDIAC CATHETERIZATION   CLINICAL HISTORY:  [name] is 60 years old and has diabetes and known  coronary disease and has had previous stenting of the LAD and previous  stenting of the mid right coronary artery and PTCA of the distal right  coronary artery about four months ago.  He recently developed recurrent  angina and was brought in the hospital for reevaluation.   PROCEDURE:  The procedure was performed via the right femoral artery using  arterial sheath and 6-French preformed coronary catheters.  A front wall  arterial puncture was performed and Omnipaque contrast was used.  No left  ventriculogram was performed in order to save contrast because of creatinine  of 1.6.  After completion of the diagnostic study made a decision to proceed  with intervention on the restenotic lesion in the distal right coronary  artery.   We planned to use a Rotablator so a temporary pacemaker was passed via the  right femoral vein to the right ventricle.  We used a 7-French AL1 guiding  catheter with side holes.  We initially crossed with a PT2 light support  wire without too much difficulty.  We attempted to pass a Roto floppy wire  as a buddy wire but this was unsuccessful.  We attempted to pass a transit  wire across the lesion but it only was able to abut against the lesion.  We  removed the PT2 wire and were able to pass the Roto floppy wire across the  lesion with the help of the transit catheter right at the edge of the  lesion.  We then used a 1.5 burr and performed three runs at approximately  160,000 RPMs for  approximately 10 seconds each.  We then passed a PT2  catheter as a buddy wire along side the Roto floppy wire and removed the  Roto floppy wire.  We dilated with a 2.5 x 15 mm Quantum Maverick performing  one inflation up to 16 atmospheres for 30 seconds.  We then attempted to  pass a 2.5 x 12 Cypher stent, but were unable to pass the stent past the mid  to distal vessel due to marked tortuosity and calcification.  We then used a  2.5 x 8 mm Taxus stent.  We were unable pass this stent past the distal  right coronary artery either.  We then used a buddy wire.  We passed the  separate PT2 wire across the lesion.  With the help of the buddy wire and  deep throating the guiding catheter we were finally able to position the  Taxus stent across the lesion.  We deployed this with one inflation of 18  atmospheres for 45 seconds.  Repeat diagnostic study was then performed  through the guiding catheter.   The right femoral artery was closed with AngioSeal at the end of the  procedure.   The patient tolerated well, though it was a long procedure lasting over two  hours.  275 mL of contrast was used.   RESULTS:  Left main coronary artery:  Free of significant disease.   Left anterior descending artery:  Gave rise to a septal perforator and a  diagonal branch.  There was moderately heavy calcification throughout the  LAD.  There was 40% narrowing in the proximal vessel.  There was 40%  narrowing in the proximal portion of the stent in the mid portion of the  vessel.  There was 70% narrowing in the distal vessel.   Circumflex artery:  Gave rise to a marginal branch and three posterolateral  branches.  There was 70% narrowing in the marginal branch and there was 70%  narrowing in the mid portion of the circumflex artery.   Right coronary artery:  Moderate sized vessel that gave rise to a small  right ventricular branch and a posterior descending branch and was  completely occluded.  The vessel  was heavily calcified and very tortuous in  its mid portion.  There was a 30% lesion in the proximal vessel.  There was  less than 10% narrowing at the stent in the mid to distal vessel.  There was  a 95% restenotic lesion in the distal vessel before the posterior descending  branch.  The vessel was totally occluded after the posterior descending  branch before two posterolateral branches.  These filled via collaterals  from the left coronary artery.   No left ventriculogram was performed.   Following rotational atherectomy and stenting of the lesion in the distal  right coronary artery, the stenosis improved from 95% to 10%.  Flow remained  TIMI 3 before and after the intervention.   The aortic pressure was 131/88 with mean of 107.  Left ventricular pressure  was 131/19.   CONCLUSION:  1. Coronary artery disease status post previous percutaneous coronary     interventions as described above with 40% narrowing in the proximal left     anterior descending, 40% narrowing in the mid vessel within the stent in     the left anterior descending, 70% distal stenosis in the left anterior     descending, 70% narrowing in the first marginal branch, and 70% narrowing     in the mid circumflex artery, less than 10% narrowing at the stent site     in the mid to distal right coronary artery, 95% stenosis in the distal     right coronary at the PTCA site, and total occlusion of the right     coronary artery after the posterior descending branch before two     posterolateral branches.  2. Normal left ventricular function by noninvasive studies.  3. Successful rotational atherectomy and stenting of the lesion in the     distal right coronary artery with improvement of center of narrowing from     95% to 10%.   DISPOSITION:  The patient returned to postanesthesia care unit for further  observation.  Charlies Constable, M.D.   BB/MEDQ  D:  04/16/2003  T:   04/16/2003  Job:  [id number]   cc:   Ramon Dredge L. Juanetta Gosling, M.D.  [address]  [place]  [place] [address]  Fax: [phone]   [facility]

## 2010-08-22 NOTE — Discharge Summary (Signed)
[facility]  Patient:    [name], [name]                     MRN: [id number] Adm. Date:  [id number] Disc. Date: 01/21/00 Attending:  Lenoria Farrier Dictator:   Lavella Hammock, P.A. CCEverardo Beals Juanda Chance, M.D. [facility]             Shaune Pollack, M.D., [place], [place]                           Discharge Summary  PROCEDURE: 1. Cardiac catheterization. 2. Coronary arteriogram. 3. Left ventriculogram.  HOSPITAL COURSE:  [name] is a 60 year old male with a strong family history of coronary artery disease who was evaluated in the office on January 14, 2000, after having an abnormal Cardiolite.  He had a prior cath showing a 90% mid and a 90% distal stenosis in the LAD and with the abnormal Cardiolite, it was felt that he needed a cardiac catheterization to evaluate this.  He came in for the catheterization on January 20, 2000, and had PTCA to the distal LAD and PTCA and stent done to the mid LAD.  In the mid LAD, the stenosis was reduced from 90 to 0 and with PTCA to the distal LAD the stenosis was reduced from 95% to 30%.  He had some hypotension with sheath removed, but this was treated with IV fluids and Trendelenburg and he recovered in a very short time.  The next day, he had minimal ecchymosis at the cath site with no hematoma or bruit and his distal pulses were intact.  He had a CBC and BMET checked and those were all within normal limits.  He was tolerating his medications well and considered stable for discharge on January 21, 2000.  LABORATORY DATA:  Sodium 134, potassium 3.6, chloride 96, CO2 30, BUN 10, creatinine 1.1, glucose 234.  Postprocedure CK-MB 113/1.6.  Hemoglobin 15.5, hematocrit 41.8, WBC 7.8, platelets 196.  CONDITION ON DISCHARGE:  Stable.  CONSULTING PHYSICIANS:  None.  COMPLICATIONS:  None.  DISCHARGE DIAGNOSES: 1. Coronary artery disease, status post percutaneous transluminal coronary    angioplasty and stent  to the mid left anterior descending with percutaneous    transluminal coronary angioplasty to the distal left anterior descending    this admission.  There is residual disease in the circumflex at 50% and a    total occlusion of the distal right coronary artery with preserved left    ventricular function. 2. Hypertension. 3. Hyperlipidemia. 4. Non-insulin-dependent diabetes mellitus. 5. Strong family history of coronary artery disease.  DISCHARGE INSTRUCTIONS: 1. His activity level is to include no driving, no sexual or strenuous    activity for two days. 2. He is to go back to work after that with light duty for a week.  He is to    stick to a low fat, diabetic diet with 60 grams of fat per day. 3. He is to call the office for bleeding, swelling, or drainage at the    cath site. 4. He has a follow-up appointment with Dr. Juanda Chance on November 7, at 10:15    a.m. 5. He is to follow up with Dr. Juanetta Gosling as needed.  DISCHARGE MEDICATIONS: 1. Glucophage XR 1000 mg restart Friday. 2. Toprol XL 25 mg q.d. 3. Multivitamin daily. 4. Vitamin E q.d. 5. Prinzide 20/25 mg q.d. 6.  Lipitor 20 mg q.d. 7. Elavil 10 mg q.h.s. 8. Nitroglycerin 0.4 mg sublingual p.r.n. 9. Plavix 75 mg q.d. DD:  01/21/00 TD:  01/21/00 Job: [id number]

## 2010-08-22 NOTE — Assessment & Plan Note (Signed)
[facility]                            CARDIOLOGY OFFICE NOTE   NAME:BISTYGAMclain, Freer                     MRN:          [id number]  DATE:04/19/2006                            DOB:          29-Dec-1950    PRIMARY CARE PHYSICIAN:  Ramon Dredge L. Juanetta Gosling, M.D.-Endocrinologist at  [facility]   CLINICAL HISTORY:  Nadine Counts is 60 years old and has documented coronary  disease with previous stenting of the LAD and previous stenting of the  mid and distal right coronary artery. His last cath was in February of  2007, which he had an unsuccessful attempt at PCI of a lesion in the  posterior descending branch past the distal stent. He also had total  occlusion beyond the posterior descending branch, which feeds to the  posterolateral branches, which filled via collaterals from the  circumflex artery. We had planned medical management after our  unsuccessful attempt at PCI.   He did well for a while, but he says over the last couple of months he  has developed some recurrence of chest pain. He says he gets pain when  he has to hurry and he slowed down a little bit and he also says he had  3 episodes at night where he woke up with chest pain and it was relieved  after about 10 minutes. He did not take a nitroglycerine.   He also has had some problems with lightheadedness and he takes blood  pressure at home and it has been as low in the 90s. He cut back his  Toprol from 150 to 100 a day.   PAST MEDICAL HISTORY:  Significant for diabetes and hyperlipidemia.   CURRENT MEDICATIONS:  1. Altace.  2. Aspirin.  3. Plavix.  4. Furosemide.  5. Glimepiride.  6. Norvasc.  7. Crestor.  8. Spironolactone.  9. Zetia.  10.Fish oil.  11.Toprol.  12.__________  13.Actos.  14.Amitriptyline.   PHYSICAL EXAMINATION:  Today, the blood pressure is 115/77. The pulse is  84 and regular. There was no venous distention. The carotid pulses were  full without bruits.  CHEST: Was  clear without rales or rhonchi.  CARDIAC: Rhythm was regular. Heart sounds were normal with no murmurs,  rubs or gallops.  ABDOMEN: Soft. There were normal bowel sounds. There is no  hepatosplenomegaly.  There is trace peripheral edema and pedal pulses were equal.   Electrocardiogram was normal.   IMPRESSION:  1. Chronic stable angina with some increase in symptoms.  2. Coronary artery disease status post multiple percutaneous      interventions as described above with unsuccessful attempt at PCA      of a posterior descending lesion February 2007.  3. Good left ventricular function.  4. Diabetes.  5. Hyperlipidemia.   RECOMMENDATIONS:  Mr. Gadsden is having increasing symptoms of angina  and has had some nocturnal angina. We presume this is related to his  original blockage, although we are not totally certain about that. He is  also having some sexual dysfunction and wanted to take Viagra, but his  pressure has been in the borderline range.  For this reason, we will cut  back his Norvasc from 10 to 5 a day. We will also add Imdur 30 mg at  night to help with nocturnal angina. I told him to take a nitroglycerine  if he has any recurrent symptoms. We will also cut the Lasix back from  40 to 20 a day for one week and then stop it if he does not notice any  swelling. He has normal left ventricular function and he was on Avandia  before, which caused fluid retention in which he is no longer taking, so  he may not need the Lasix. I told him I am concerned that his symptoms  could represent a new blockage and that if his symptoms accelerate  rather than decelerate with the changes we make today, he should let us  know. I will plan to see him back in 6 weeks. If he has persistent  symptoms then we may need either to consider Ranexa or reconsider our  options for revascularization.     Bruce Elvera Lennox Juanda Chance, MD, [facility]  Electronically Signed    BRB/MedQ  DD: 04/19/2006  DT: 04/19/2006  Job  #: [id number]

## 2010-08-22 NOTE — Discharge Summary (Signed)
   NAME:  [name], [name]                        ACCOUNT NO.:  [id number]   MEDICAL RECORD NO.:  [id number]                   PATIENT TYPE:  INP   LOCATION:  A323                                 FACILITY:  [facility]   PHYSICIAN:  Dirk Dress. Katrinka Blazing, M.D.                DATE OF BIRTH:  [birth year]   DATE OF ADMISSION:  08/08/2002  DATE OF DISCHARGE:  08/09/2002                                 DISCHARGE SUMMARY   DISCHARGE DIAGNOSES:  1. Acute appendicitis.  2. Hypertension.  3. Diabetes mellitus.  4. Coronary artery disease.  5. Lumbar disk disease.   SPECIAL PROCEDURE:  Laparoscopic appendectomy - Aug 08, 2002.   DISPOSITION:  Patient is discharged home in stable and satisfactory  condition.   DISCHARGE MEDICATIONS:  1. Tylox one q.i.d. p.r.n. pain.  2. Levaquin 500 mg daily x 7 days.   SUMMARY:  A 60 year old male with a history of acute onset of abdominal pain  which initially was diffuse and then localized to the right lower quadrant.  He had nausea without vomiting.  CT scan showed acute appendicitis.  The  patient was admitted for an appendectomy.   PAST MEDICAL HISTORY:  1. Positive for hypertension.  2. Diabetes mellitus.  3. Hyperlipidemia.  4. Coronary artery disease.  5. Lumbar disk disease.   EXAMINATION:  VITAL SIGNS:  He was afebrile on admission.  Vital signs were  stable.  ABDOMEN:  Distended with moderate tenderness in the right lower quadrant  with guarding and mass effect.   The patient was admitted and underwent laparoscopic appendectomy on the day  of admission.  He was found to have acute appendicitis.  He did well in the  postoperative period.  He became afebrile.  White count was 7,000,  hemoglobin remained stable.  The evening of the first postoperative day the  patient was asymptomatic except for mild soreness and he was discharged home  in satisfactory condition.                                                Dirk Dress. Katrinka Blazing, M.D.    LCS/MEDQ  D:   10/10/2002  T:  10/11/2002  Job:  [id number]

## 2010-08-22 NOTE — Cardiovascular Report (Signed)
NAME:  [name], [name] NO.:  [id number]   MEDICAL RECORD NO.:  [id number]          PATIENT TYPE:  OIB   LOCATION:  6501                         FACILITY:  [facility]   PHYSICIAN:  Charlies Constable, M.D. 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  He  recently developed recurrent symptoms and was scheduled for evaluation with  catheterization.   DESCRIPTION OF PROCEDURE:  The procedure was performed via the right femoral  artery using an arterial sheath and 6 French preformed coronary catheters.  A front wall arterial puncture was performed and Omnipaque contrast was  used.  The patient tolerated the procedure well and left the laboratory in  satisfactory condition.   RESULTS:  1.  The aortic pressure was 134/86 with a mean of 106.  2.  The left ventricular pressure was 134/19.  3.  Left main coronary artery:  The left main coronary artery was free of      significant disease.  4.  Left anterior descending artery:  The left anterior descending artery      gave rise to three diagonal branches and a septal perforator.  There was      40% narrowing of the proximal LAD. There was less than 10% narrowing at      the stent site in the mid LAD and there was 80% narrowing in the mid to      distal LAD.  5.  Left circumflex:  The left circumflex artery gave rise to two marginal      branches and two posterolateral branches.  There was a 50 to 70%      narrowing after the first marginal branch.  6.  Right coronary artery:  The right coronary artery is a moderate size      vessel that gave rise to a right ventricular branch and a posterior      descending  branch and then was completely occluded before two      posterolateral branches.  The posterolateral branches filled by      collaterals from the left coronary artery.  There is a less than 10%      narrowing at the stent site in the mid right coronary artery.  There was      95% stenosis in the distal right coronary artery at the distal edge of      the stent.  This was just before the takeoff of the posterior descending      branch with TIMI-2 flow.  7.  Left ventriculogram:  The left ventriculogram was performed in the RAO      projection and showed hypokinesis of the mid inferior wall.  The overall      motion was good and the estimated ejection fraction was 55%.   CONCLUSION:  1.  Coronary artery disease status post multiple percutaneous interventions      as described above with 40% narrowing in the proximal left anterior      descending, less than 10% narrowing at the stent site in the mid left      anterior descending and 80% narrowing in the distal left anterior      descending; 50 to 70% narrowing in the proximal to mid circumflex      artery, less than 10% narrowing at the stent site in the mid right      coronary artery and 95% stenosis at the distal edge of the stent in the      distal right coronary artery with total occlusion of the distal right      coronary artery after the posterior descending branch with inferior wall      hypokinesis.   RECOMMENDATIONS:  The patient has developed restenosis at the stent site in  the distal right coronary artery.  We will plan repeat percutaneous coronary  interventions and we will schedule this next week.  The distal stent was a  2.5 x 8 mm Taxus.       BB/MEDQ  D:  04/17/2004  T:  04/17/2004  Job:  [id number]   cc:   Ramon Dredge L. Juanetta Gosling, M.D.  [address]  [place]  [place] [address]  Fax: [phone]

## 2010-08-22 NOTE — Cardiovascular Report (Signed)
[facility]  Patient:    [name], [name]                     MRN: [id number] Proc. Date: 01/20/00 Adm. Date:  [id number] Disc. Date: [id number] Attending:  Lenoria Farrier CC:         Kari Baars, M.D.             Cardiac Catheterization Laboratory                        Cardiac Catheterization  INDICATIONS:  Mr. Stockhausen is 60 years old and has a markedly positive family history of coronary artery disease, hypertension, diabetes, and hyperlipidemia.  He recently had some chest tightness which was nonexertional. He had a Cardiolite scan which showed inferior ischemia.  His catheterization showed a total right, 50% ostial circumflex, and high-grade lesions in the mid and distal LAD.   After some review, we decided to intervene on the LAD and treat the rest of his disease medically.  DESCRIPTION OF PROCEDURE:  The patient was performed via the right femoral using arterial sheath and a 6-French preformed coronary catheter.  We initially used a JL-4 7-French guiding catheter and a short floppy wire and went in with a 2 point wire.  The patient was given weight adjusted heparin and following the ACT of greater than 200 seconds was given double bolus Integrilin and infusion.  We used a 2.0 x 15 mm CrossSail and predilated the first lesion with four inflations up to 14 atm x 50 seconds and then we dilated the distal lesion in the LAD with four inflations up to 11 atm x 60 seconds.  We then attempted to pass a 2.5 x 13 mm Tetra, but we were unable to access the lesion due to tortuosity and calcification in the proximal LAD.  We tried a 2.5 x 9 mm AVE and this also would not go.  We then used a transit catheter to exchange for an extra sport wire and tried both stents again and they still would not go.  We then removed the whole system and went in with a 4.0 7-French Voda catheter and the extra sport wire.  With this, we were able to reach the lesion  with a 2.5 x 9 mm S-7 AVE stent with some difficulty.  We deployed this with one inflation at 16 atm x 65 seconds.  We then went back and touched up the distal lesion in the LAD with the 2.0 x 15 mm CrossSail balloon, performing two inflations of 8 atm x 65 seconds and 80 seconds. Repeat diagnostic studies were then performed through the guiding catheter.  The procedure was a long procedure, but the patient tolerated the procedure well and left the laboratory in satisfactory condition.  RESULTS:  Initially, the stenosis in the mid LAD was estimated at 90%. Following stenting, this improved to 0%.  The lesion in the distal LAD was located just after a bend and was estimated at 95% and this improved to 30% after balloon dilatation.  There was a small linear dissection.  CONCLUSIONS: 1.  Successful stenting of the mid left anterior descending artery stenosis     with improvement in percent of diameter narrowing from 90% to 0%. 2.  Successful percutaneous transluminal coronary angioplasty of the distal     left anterior descending artery lesion with improvement in percent of     diameter narrowing from  95% to 30%.  DISPOSITION:  The patient was returned to the postanesthesia unit for further observation. DD:  01/20/00 TD:  01/20/00 Job: [id number]

## 2011-01-01 LAB — POCT I-STAT 4, (NA,K, GLUC, HGB,HCT)
Glucose, Bld: 201 — ABNORMAL HIGH
Glucose, Bld: 226 — ABNORMAL HIGH
HCT: 33 — ABNORMAL LOW
HCT: 36 — ABNORMAL LOW
HCT: 40
Hemoglobin: 10.2 — ABNORMAL LOW
Hemoglobin: 12.2 — ABNORMAL LOW
Hemoglobin: 12.6 — ABNORMAL LOW
Hemoglobin: 9.5 — ABNORMAL LOW
Operator id: 3406
Operator id: 3406
Operator id: 3406
Potassium: 4.3
Potassium: 4.8
Sodium: 131 — ABNORMAL LOW
Sodium: 131 — ABNORMAL LOW
Sodium: 135
Sodium: 136

## 2011-01-01 LAB — CBC
HCT: 33.7 — ABNORMAL LOW
HCT: 33.8 — ABNORMAL LOW
HCT: 44.3
Hemoglobin: 11.5 — ABNORMAL LOW
MCHC: 34
MCHC: 35.6
MCV: 83.8
MCV: 85.4
Platelets: 129 — ABNORMAL LOW
Platelets: 133 — ABNORMAL LOW
Platelets: 145 — ABNORMAL LOW
Platelets: 162
Platelets: 229
RBC: 4.15 — ABNORMAL LOW
RBC: 4.16 — ABNORMAL LOW
RDW: 13.9
RDW: 14
RDW: 14.1
RDW: 14.2
RDW: 14.8
WBC: 6.7
WBC: 8.1

## 2011-01-01 LAB — POCT I-STAT 3, ART BLOOD GAS (G3+)
Acid-base deficit: 1
Acid-base deficit: 2
Acid-base deficit: 3 — ABNORMAL HIGH
Acid-base deficit: 3 — ABNORMAL HIGH
Bicarbonate: 23.8
Bicarbonate: 25.4 — ABNORMAL HIGH
O2 Saturation: 100
O2 Saturation: 86
O2 Saturation: 88
O2 Saturation: 88
O2 Saturation: 91
O2 Saturation: 94
O2 Saturation: 94
O2 Saturation: 95
Operator id: 199821
Operator id: 199901
Operator id: 270211
Operator id: 3406
Patient temperature: 37.6
Patient temperature: 37.8
Patient temperature: 37.8
TCO2: 25
TCO2: 25
TCO2: 26
TCO2: 27
pCO2 arterial: 40.3
pCO2 arterial: 57 — ABNORMAL HIGH
pCO2 arterial: 57.2
pH, Arterial: 7.278 — ABNORMAL LOW
pH, Arterial: 7.362
pO2, Arterial: 61 — ABNORMAL LOW
pO2, Arterial: 61 — ABNORMAL LOW
pO2, Arterial: 83

## 2011-01-01 LAB — BASIC METABOLIC PANEL
BUN: 10
BUN: 7
BUN: 7
BUN: 8
CO2: 28
Calcium: 7.7 — ABNORMAL LOW
Calcium: 7.9 — ABNORMAL LOW
Calcium: 7.9 — ABNORMAL LOW
Chloride: 108
Chloride: 93 — ABNORMAL LOW
Creatinine, Ser: 1.05
Creatinine, Ser: 1.11
Creatinine, Ser: 1.18
Creatinine, Ser: 1.22
GFR calc Af Amer: >60
GFR calc Af Amer: >60
GFR calc non Af Amer: >60
GFR calc non Af Amer: >60
GFR calc non Af Amer: >60
Glucose, Bld: 217 — ABNORMAL HIGH
Glucose, Bld: 252 — ABNORMAL HIGH
Potassium: 3.9
Sodium: 131 — ABNORMAL LOW

## 2011-01-01 LAB — URINALYSIS, ROUTINE W REFLEX MICROSCOPIC
Ketones, ur: NEGATIVE
Nitrite: NEGATIVE
Protein, ur: NEGATIVE

## 2011-01-01 LAB — COMPREHENSIVE METABOLIC PANEL
Albumin: 4.2
BUN: 14
Calcium: 10.1
Creatinine, Ser: 1.23
Potassium: 4.6
Total Protein: 6.5

## 2011-01-01 LAB — BLOOD GAS, ARTERIAL
Bicarbonate: 24
FIO2: 0.21
O2 Saturation: 94.2
Patient temperature: 98.6

## 2011-01-01 LAB — POCT I-STAT, CHEM 8
Calcium, Ion: 1.14
Chloride: 104
Glucose, Bld: 117 — ABNORMAL HIGH
HCT: 33 — ABNORMAL LOW
Hemoglobin: 11.2 — ABNORMAL LOW

## 2011-01-01 LAB — APTT
aPTT: 24
aPTT: 30

## 2011-01-01 LAB — CREATININE, SERUM
Creatinine, Ser: 0.98
Creatinine, Ser: 1.18
GFR calc Af Amer: >60
GFR calc non Af Amer: >60

## 2011-01-01 LAB — MAGNESIUM
Magnesium: 2.3
Magnesium: 2.5

## 2011-01-01 LAB — B-NATRIURETIC PEPTIDE (CONVERTED LAB): Pro B Natriuretic peptide (BNP): 258 — ABNORMAL HIGH

## 2011-01-01 LAB — PROTIME-INR
INR: 1
INR: 1.2
Prothrombin Time: 13.1
Prothrombin Time: 15.9 — ABNORMAL HIGH

## 2011-01-01 LAB — POCT I-STAT GLUCOSE
Glucose, Bld: 176 — ABNORMAL HIGH
Operator id: 3406

## 2011-01-01 LAB — PLATELET COUNT: Platelets: 171

## 2011-01-01 LAB — ABO/RH: ABO/RH(D): B POS

## 2011-01-01 LAB — HEMOGLOBIN A1C: Mean Plasma Glucose: 286

## 2011-01-05 LAB — GLUCOSE, CAPILLARY: Glucose-Capillary: 213 — ABNORMAL HIGH

## 2011-07-08 ENCOUNTER — Telehealth: Payer: Self-pay | Admitting: Cardiology

## 2011-07-08 NOTE — Telephone Encounter (Signed)
LMTCB

## 2011-07-08 NOTE — Telephone Encounter (Signed)
New msg Pt wants refill of crestor 20 mg called to [facility] [address]

## 2011-07-09 NOTE — Telephone Encounter (Signed)
Spoke with pt. I am waiting on form for [employer] to obtain authorization for crestor. I will leave  samples of crestor 20mg  #21 at front desk for pt to pick up until this issue has been resolved.

## 2011-07-09 NOTE — Telephone Encounter (Signed)
Fu call °Pt returning your call again °

## 2011-07-09 NOTE — Telephone Encounter (Signed)
LMTCB

## 2011-07-14 ENCOUNTER — Other Ambulatory Visit: Payer: Self-pay | Admitting: *Deleted

## 2011-07-14 MED ORDER — RAMIPRIL 10 MG PO CAPS: 10.0000 mg | ORAL_CAPSULE | Freq: Every day | ORAL | Status: DC

## 2011-07-14 MED ORDER — METOPROLOL TARTRATE 25 MG PO TABS: 25.0000 mg | ORAL_TABLET | Freq: Two times a day (BID) | ORAL | Status: DC

## 2011-07-16 NOTE — Telephone Encounter (Signed)
Received approval for crestor for the life of the policy from [place] [employer] [place] [phone] [facility]. Spoke with pt and he is aware of approval of crestor for the life of the policy.

## 2011-08-13 ENCOUNTER — Encounter: Payer: Self-pay | Admitting: *Deleted

## 2011-08-24 ENCOUNTER — Ambulatory Visit (INDEPENDENT_AMBULATORY_CARE_PROVIDER_SITE_OTHER): Payer: BC Managed Care – PPO | Admitting: Cardiology

## 2011-08-24 VITALS — BP 145/86 | HR 77 | Wt 238.0 lb

## 2011-08-24 DIAGNOSIS — E785 Hyperlipidemia, unspecified: Secondary | ICD-10-CM

## 2011-08-24 DIAGNOSIS — I25810 Atherosclerosis of coronary artery bypass graft(s) without angina pectoris: Secondary | ICD-10-CM

## 2011-08-24 DIAGNOSIS — G4733 Obstructive sleep apnea (adult) (pediatric): Secondary | ICD-10-CM

## 2011-08-24 MED ORDER — CLOPIDOGREL BISULFATE 75 MG PO TABS: 75.0000 mg | ORAL_TABLET | Freq: Every day | ORAL | Status: DC

## 2011-08-24 MED ORDER — NITROGLYCERIN 0.4 MG SL SUBL: 0.4000 mg | SUBLINGUAL_TABLET | SUBLINGUAL | Status: DC | PRN

## 2011-08-24 MED ORDER — ASPIRIN EC 81 MG PO TBEC: 81.0000 mg | DELAYED_RELEASE_TABLET | Freq: Every day | ORAL | Status: DC

## 2011-08-24 MED ORDER — ROSUVASTATIN CALCIUM 20 MG PO TABS: 20.0000 mg | ORAL_TABLET | Freq: Every day | ORAL | Status: DC

## 2011-08-24 MED ORDER — METOPROLOL TARTRATE 25 MG PO TABS: 25.0000 mg | ORAL_TABLET | Freq: Two times a day (BID) | ORAL | Status: DC

## 2011-08-24 MED ORDER — EZETIMIBE 10 MG PO TABS: 10.0000 mg | ORAL_TABLET | Freq: Every day | ORAL | Status: DC

## 2011-08-24 MED ORDER — RAMIPRIL 10 MG PO CAPS: 10.0000 mg | ORAL_CAPSULE | Freq: Every day | ORAL | Status: DC

## 2011-08-24 MED ORDER — FUROSEMIDE 40 MG PO TABS: 40.0000 mg | ORAL_TABLET | Freq: Every day | ORAL | Status: DC

## 2011-08-24 NOTE — Patient Instructions (Signed)
Your physician wants you to follow-up in: 1 year with Dr. Shirlee Latch.  You will receive a reminder letter in the mail two months in advance. If you don't receive a letter, please call our office to schedule the follow-up appointment.  Decrease Aspirin to 81mg  daily.  Take Metoprolol twice daily.  Your physician recommends that you return for fasting lab work in: 2-3 weeks.  Lipids, Liver and BMET

## 2011-08-24 NOTE — Progress Notes (Signed)
Primary Provider: Dr. Juanetta Gosling   61 yo with history of CAD s/p CABG in 2009 as well as HTN, DM, and hyperlipidemia presents for followup.  He has been doing well symptomatically. 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 No exercise limitations. He has never used NTG since his bypass. Diabetes control is still not ideal.  Echo done in 3/12 showed preserved EF.  He does report daytime sleepiness with snoring.    ECG: NSR, normal   Labs (5/11): LDL 21, HDL 38, TGs 339, K 4.1, creatinine 1.2   Allergies:  1) Amoxicillin (Amoxicillin)  2) Amoxicillin (Amoxicillin)   Past Medical History:  1. CAD: Multiple PCI procedures followed by CABG in 2009 with LIMA-LAD, SVG-D, seq SVG-OM and PLV, SVG-PDA.  Echo (3/12): EF 60-65%, mild MR.  2. Insulin-dependent diabetes: Followed by Dr. Chestine Spore.  3. Hypertension.  4. Hyperlipidemia.  5. Postoperative atrial fibrillation after CABG in 2009   Family History:  Very strongly positive for coronary disease. 2 brothers with CABG. Multiple other family members had heart disease.   Social History:  He works at [employer] in [place] but lives up in [place]. He is married and lives with his wife. No children. He has never smoked, but used to chew some tobacco. He denies alcohol abuse. Originally from [place].   Current Outpatient Prescriptions  Medication Sig Dispense Refill  . amitriptyline (ELAVIL) 75 MG tablet Take 75 mg by mouth at bedtime.      [name] aspirin 81 MG tablet Take 1 tablet (81 mg total) by mouth daily.  30 tablet  3  . clopidogrel (PLAVIX) 75 MG tablet Take 1 tablet (75 mg total) by mouth daily.  90 tablet  3  . ezetimibe (ZETIA) 10 MG tablet Take 1 tablet (10 mg total) by mouth daily.  90 tablet  3  . furosemide (LASIX) 40 MG tablet Take 1 tablet (40 mg total) by mouth daily.  90 tablet  3  . insulin NPH-insulin regular (NOVOLIN 70/30) (70-30) 100 UNIT/ML injection Inject 40 Units  into the skin.      . metFORMIN (GLUCOPHAGE) 1000 MG tablet Take 1,000 mg by mouth 2 (two) times daily with a meal.      . metoprolol tartrate (LOPRESSOR) 25 MG tablet Take 1 tablet (25 mg total) by mouth 2 (two) times daily.  180 tablet  3  . nitroGLYCERIN (NITROSTAT) 0.4 MG SL tablet Place 1 tablet (0.4 mg total) under the tongue every 5 (five) minutes as needed.  100 tablet  3  . Omega-3 Fatty Acids (FISH OIL) 300 MG CAPS Take 2 capsules by mouth daily.      . ramipril (ALTACE) 10 MG capsule Take 1 capsule (10 mg total) by mouth daily.  90 capsule  3  . rosuvastatin (CRESTOR) 20 MG tablet Take 1 tablet (20 mg total) by mouth daily.  90 tablet  3    BP 145/86  Pulse 77  Wt 238 lb (107.956 kg) General: NAD Neck: Thick, no JVD, no thyromegaly or thyroid nodule.  Lungs: Clear to auscultation bilaterally with normal respiratory effort. CV: Nondisplaced PMI.  Heart regular S1/S2, no S3/S4, no murmur.  1+ bilateral ankle edema.  No carotid bruit.  Normal pedal pulses.  Abdomen: Soft, nontender, no hepatosplenomegaly, no distention.  Neurologic: Alert and oriented x 3.  Psych: Normal affect. Extremities: No clubbing or cyanosis.

## 2011-08-25 ENCOUNTER — Encounter: Payer: Self-pay | Admitting: Cardiology

## 2011-08-25 DIAGNOSIS — G4733 Obstructive sleep apnea (adult) (pediatric): Secondary | ICD-10-CM | POA: Insufficient documentation

## 2011-08-25 HISTORY — DX: Obstructive sleep apnea (adult) (pediatric): G47.33

## 2011-08-25 NOTE — Assessment & Plan Note (Signed)
Check lipids/LFTs with goal LDL < 70.  

## 2011-08-25 NOTE — Assessment & Plan Note (Signed)
Daytime sleepiness, snoring, thick neck => worrisome for OSA.  Given cardiovascular risk from OSA, I suggested that he get a sleep study.  He will think about it.

## 2011-08-25 NOTE — Assessment & Plan Note (Addendum)
Stable with no ischemic symptoms.  OK to decrease ASA to 81 mg daily.  Continue Plavix, metoprolol, ramipril, Crestor.  He has only been taking metoprolol tartrate once a day.  I asked him to take it bid.

## 2011-09-07 ENCOUNTER — Other Ambulatory Visit (INDEPENDENT_AMBULATORY_CARE_PROVIDER_SITE_OTHER): Payer: BC Managed Care – PPO

## 2011-09-07 DIAGNOSIS — I25810 Atherosclerosis of coronary artery bypass graft(s) without angina pectoris: Secondary | ICD-10-CM

## 2011-09-07 LAB — BASIC METABOLIC PANEL
BUN: 16 mg/dL (ref 6–23)
Calcium: 9.1 mg/dL (ref 8.4–10.5)
Chloride: 105 mEq/L (ref 96–112)
Creatinine, Ser: 1.1 mg/dL (ref 0.4–1.5)

## 2011-09-07 LAB — HEPATIC FUNCTION PANEL
ALT: 27 U/L (ref 0–53)
Total Bilirubin: 0.7 mg/dL (ref 0.3–1.2)

## 2011-09-07 LAB — LIPID PANEL
Cholesterol: 96 mg/dL (ref 0–200)
HDL: 43 mg/dL (ref >39.00)
LDL Cholesterol: 22 mg/dL (ref 0–99)
Triglycerides: 154 mg/dL — ABNORMAL HIGH (ref 0.0–149.0)

## 2011-09-08 ENCOUNTER — Telehealth: Payer: Self-pay | Admitting: Cardiology

## 2011-09-08 NOTE — Telephone Encounter (Signed)
Spoke with pt about recent lab results 

## 2011-09-08 NOTE — Telephone Encounter (Signed)
Fu call °Patient returning your call °

## 2011-10-02 ENCOUNTER — Emergency Department (HOSPITAL_COMMUNITY)
Admission: EM | Admit: 2011-10-02 | Discharge: 2011-10-02 | Disposition: A | Discharge status: Home / Self Care | Payer: BC Managed Care – PPO | Attending: Emergency Medicine | Admitting: Emergency Medicine

## 2011-10-02 ENCOUNTER — Ambulatory Visit (INDEPENDENT_AMBULATORY_CARE_PROVIDER_SITE_OTHER): Payer: BC Managed Care – PPO | Admitting: Emergency Medicine

## 2011-10-02 DIAGNOSIS — R1110 Vomiting, unspecified: Secondary | ICD-10-CM

## 2011-10-02 DIAGNOSIS — IMO0001 Reserved for inherently not codable concepts without codable children: Secondary | ICD-10-CM

## 2011-10-02 DIAGNOSIS — R112 Nausea with vomiting, unspecified: Secondary | ICD-10-CM

## 2011-10-02 DIAGNOSIS — R197 Diarrhea, unspecified: Secondary | ICD-10-CM

## 2011-10-02 DIAGNOSIS — Z951 Presence of aortocoronary bypass graft: Secondary | ICD-10-CM | POA: Insufficient documentation

## 2011-10-02 DIAGNOSIS — E119 Type 2 diabetes mellitus without complications: Secondary | ICD-10-CM | POA: Insufficient documentation

## 2011-10-02 DIAGNOSIS — Z7982 Long term (current) use of aspirin: Secondary | ICD-10-CM | POA: Insufficient documentation

## 2011-10-02 DIAGNOSIS — I10 Essential (primary) hypertension: Secondary | ICD-10-CM | POA: Insufficient documentation

## 2011-10-02 DIAGNOSIS — I4891 Unspecified atrial fibrillation: Secondary | ICD-10-CM | POA: Insufficient documentation

## 2011-10-02 DIAGNOSIS — Z79899 Other long term (current) drug therapy: Secondary | ICD-10-CM | POA: Insufficient documentation

## 2011-10-02 LAB — URINALYSIS, ROUTINE W REFLEX MICROSCOPIC
Bilirubin Urine: NEGATIVE
Glucose, UA: >1000 mg/dL — AB
Hgb urine dipstick: NEGATIVE
Ketones, ur: 15 mg/dL — AB
Nitrite: NEGATIVE
pH: 5.5 (ref 5.0–8.0)

## 2011-10-02 LAB — CBC
MCH: 28.7 pg (ref 26.0–34.0)
Platelets: 170 10*3/uL (ref 150–400)
RBC: 5.44 MIL/uL (ref 4.22–5.81)
WBC: 8.4 10*3/uL (ref 4.0–10.5)

## 2011-10-02 LAB — COMPREHENSIVE METABOLIC PANEL
ALT: 24 U/L (ref 0–53)
Alkaline Phosphatase: 74 U/L (ref 39–117)
GFR calc Af Amer: 87 mL/min — ABNORMAL LOW (ref >90)
Glucose, Bld: 252 mg/dL — ABNORMAL HIGH (ref 70–99)
Potassium: 4.1 mEq/L (ref 3.5–5.1)
Sodium: 138 mEq/L (ref 135–145)
Total Protein: 6.2 g/dL (ref 6.0–8.3)

## 2011-10-02 LAB — DIFFERENTIAL
Eosinophils Absolute: 0.1 10*3/uL (ref 0.0–0.7)
Lymphocytes Relative: 8 % — ABNORMAL LOW (ref 12–46)
Lymphs Abs: 0.7 10*3/uL (ref 0.7–4.0)
Neutrophils Relative %: 81 % — ABNORMAL HIGH (ref 43–77)

## 2011-10-02 MED ORDER — ONDANSETRON 8 MG PO TBDP: 8.0000 mg | ORAL_TABLET | Freq: Three times a day (TID) | ORAL | Status: AC | PRN

## 2011-10-02 MED ORDER — SODIUM CHLORIDE 0.9 % IV SOLN
1000.0000 mL | INTRAVENOUS | Status: DC
  Administered 2011-10-02: 1000 mL via INTRAVENOUS

## 2011-10-02 MED ORDER — ONDANSETRON HCL 4 MG/2ML IJ SOLN: 4.0000 mg | Freq: Once | INTRAMUSCULAR | Status: DC

## 2011-10-02 MED ORDER — SODIUM CHLORIDE 0.9 % IV SOLN: 1000.0000 mL | Freq: Once | INTRAVENOUS | Status: DC

## 2011-10-02 NOTE — ED Provider Notes (Signed)
History     CSN: [id number] Arrival date & time 10/02/11  1723 First MD Initiated Contact with Patient 10/02/11 1727    Chief Complaint  Patient presents with  . Nausea    HPI Pt was at work today  And started having trouble with vomiting and diarrhea.  He vomited about 10 times and had a couple of episodes of diarrhea.  No pain but he had a lot of nausea.  No chest pain or shortness of breath.  In the past when he had issues with his heart he did have chest discomfort and did not feel anything like his prior heart  symptoms.  No fever, no cough , no trouble urinating.  No prior history of stomach problems.  No prior issue of gallbladder problems.  No foreign travel.  No raw foods.  No recent ill contacts, except dog is ill.  He was working in an Agricultural engineer.  He did stop sweating but does not remember feeling particularly over heated. Past Medical History  Diagnosis Date  . Coronary atherosclerosis of autologous vein bypass graft   . Type II or unspecified type diabetes mellitus without mention of complication, not stated as uncontrolled   . Other and unspecified hyperlipidemia   . Essential hypertension, benign   . CAD (coronary artery disease)   . Type II or unspecified type diabetes mellitus without mention of complication, not stated as uncontrolled   . Postoperative atrial fibrillation 2009    Past Surgical History  Procedure Date  . Coronary artery bypass graft 2009    post op. a fib, LIMA -LAD, SVG-D,seq SVG-OM and PLV,  SVG-PDA.  . Cardiac catheterization 2009    EF 60%    Family History  Problem Relation Age of Onset  . Coronary artery disease      family history  . Heart disease      family history  . Other Brother     CABG  . Other Brother     CABG    History  Substance Use Topics  . Smoking status: Never Smoker   . Smokeless tobacco: Former Neurosurgeon    Types: Chew  . Alcohol Use: No      Review of Systems  All other systems reviewed and are  negative.    Allergies  Amoxicillin and Penicillins  Home Medications   Current Outpatient Rx  Name Route Sig Dispense Refill  . AMITRIPTYLINE HCL 75 MG PO TABS Oral Take 75 mg by mouth at bedtime.    . ASPIRIN EC 81 MG PO TBEC Oral Take 1 tablet (81 mg total) by mouth daily. 30 tablet 3  . CLOPIDOGREL BISULFATE 75 MG PO TABS Oral Take 1 tablet (75 mg total) by mouth daily. 90 tablet 3  . EZETIMIBE 10 MG PO TABS Oral Take 1 tablet (10 mg total) by mouth daily. 90 tablet 3  . FUROSEMIDE 40 MG PO TABS Oral Take 1 tablet (40 mg total) by mouth daily. 90 tablet 3  . INSULIN LISPRO PROT & LISPRO (50-50) 100 UNIT/ML [place] SUSP Subcutaneous Inject 40 Units into the skin every evening.    . INSULIN LISPRO PROT & LISPRO (75-25) 100 UNIT/ML [place] SUSP Subcutaneous Inject 40 Units into the skin every morning.    [name] METFORMIN HCL 1000 MG PO TABS Oral Take 1,000 mg by mouth 2 (two) times daily with a meal.    . METOPROLOL TARTRATE 25 MG PO TABS Oral Take 1 tablet (25 mg total) by  mouth 2 (two) times daily. 180 tablet 3  . NITROGLYCERIN 0.4 MG SL SUBL Sublingual Place 1 tablet (0.4 mg total) under the tongue every 5 (five) minutes as needed. 100 tablet 3  . FISH OIL 300 MG PO CAPS Oral Take 2 capsules by mouth daily.    [name] RAMIPRIL 10 MG PO CAPS Oral Take 1 capsule (10 mg total) by mouth daily. 90 capsule 3  . ROSUVASTATIN CALCIUM 20 MG PO TABS Oral Take 1 tablet (20 mg total) by mouth daily. 90 tablet 3    BP 127/70  Pulse 83  Temp 99.3 F (37.4 C) (Oral)  Resp 18  SpO2 96%  Physical Exam  Nursing note and vitals reviewed. Constitutional: He appears well-developed and well-nourished. No distress.  HENT:  Head: Normocephalic and atraumatic.  Right Ear: External ear normal.  Left Ear: External ear normal.  Eyes: Conjunctivae are normal. Right eye exhibits no discharge. Left eye exhibits no discharge. No scleral icterus.  Neck: Neck supple. No tracheal deviation present.  Cardiovascular: Normal  rate, regular rhythm and intact distal pulses.   Pulmonary/Chest: Effort normal and breath sounds normal. No stridor. No respiratory distress. He has no wheezes. He has no rales.  Abdominal: Soft. Bowel sounds are normal. He exhibits no distension. There is no tenderness. There is no rebound and no guarding.  Musculoskeletal: He exhibits no edema and no tenderness.  Neurological: He is alert. He has normal strength. No sensory deficit. Cranial nerve deficit:  no gross defecits noted. He exhibits normal muscle tone. He displays no seizure activity. Coordination normal.  Skin: Skin is warm and dry. No rash noted.  Psychiatric: He has a normal mood and affect.    ED Course  Procedures (including critical care time)  Labs Reviewed  DIFFERENTIAL - Abnormal; Notable for the following:    Neutrophils Relative 81 (*)     Lymphocytes Relative 8 (*)     All other components within normal limits  COMPREHENSIVE METABOLIC PANEL - Abnormal; Notable for the following:    Glucose, Bld 252 (*)     GFR calc non Af Amer 75 (*)     GFR calc Af Amer 87 (*)     All other components within normal limits  URINALYSIS, ROUTINE W REFLEX MICROSCOPIC - Abnormal; Notable for the following:    Glucose, UA >1000 (*)     Ketones, ur 15 (*)     All other components within normal limits  CBC  URINE MICROSCOPIC-ADD ON   No results found.   1. Vomiting and diarrhea       MDM  Review the EKG from [facility]. It appears to be a normal sinus rhythm without any acute ischemic changes.  The patient's symptoms seem to be related to a GI etiology. It is also possibly a component of heat exhaustion. The patient denied any chest pain. He did not have any shortness of breath. The symptoms were not similar to his prior cardiac symptoms. The EKG that he had was unremarkable. Patient feels better at this time and is ready for discharge       Celene Kras, MD 10/02/11 2029

## 2011-10-02 NOTE — Progress Notes (Signed)
  Subjective:    Patient ID: [name], male    DOB: 1950-09-21, 61 y.o.   MRN: [id number]  HPI  Presents accompanied by his wife reporting 2 hours of nausea, vomiting (x10 episodes) and diarrhea that began suddenly.  Ate breakfast, then lunch at [facility] at about noon. Last emesis and diarrhea about 20 minutes ago.  No recent illness or unusual foods.  Thinks he may have had more soda to drink today than usual.  Works in a [employer] engineer.He has known CAD, 3V CABG three years ago, diabetes complicated by neuropathy.  Review of Systems No CP, SOB associated with these symptoms.  Feels weak and fatigued.  May be a little dehydrated.    Objective:   Physical Exam  Vitals reviewed. Constitutional: He is oriented to person, place, and time. He appears well-developed and well-nourished. He appears distressed.       Pulse oximetry dropped from 93% to 88% during initial evaluation.  HENT:  Head: Normocephalic and atraumatic.  Neck: Normal range of motion.  Cardiovascular: Normal rate, regular rhythm and normal heart sounds.  Exam reveals no gallop and no friction rub.   No murmur heard. Pulmonary/Chest: Effort normal and breath sounds normal.  Abdominal: Soft. Bowel sounds are normal.       Abrasion with ecchymosis on the left lower abdomen from recent board falling on him at work  Neurological: He is alert and oriented to person, place, and time.  Skin: Skin is warm. He is diaphoretic. There is pallor.  Psychiatric: He has a normal mood and affect. His behavior is normal. Judgment and thought content normal.   O2 placed at 3 L by nasal cannula and sats improved to 93%.  EKG is normal without evidence of ischemia.  Results for orders placed in visit on 10/02/11  GLUCOSE, POCT (MANUAL RESULT ENTRY)      Component Value Range   POC Glucose 257 (*) 70 - 99 mg/dl        Assessment & Plan:   1. Nausea & vomiting    2. Diarrhea    3. Type II or unspecified type diabetes mellitus without  mention of complication, not stated as uncontrolled  POCT glucose (manual entry)   Transported to the ED by EMS.    Patient seen with Dr. Cleta Alberts.

## 2011-10-02 NOTE — Discharge Instructions (Signed)
Nausea and Vomiting   Nausea means you feel sick to your stomach. Throwing up (vomiting) is a reflex where stomach contents come out of your mouth.   HOME CARE   Take medicine as told by your doctor.   Do not force yourself to eat. However, you do need to drink fluids.   If you feel like eating, eat a normal diet as told by your doctor.   Eat rice, wheat, potatoes, bread, lean meats, yogurt, fruits, and vegetables.   Avoid high-fat foods.   Drink enough fluids to keep your pee (urine) clear or pale yellow.   Ask your doctor how to replace body fluid losses (rehydrate). Signs of body fluid loss (dehydration) include:   Feeling very thirsty.   Dry lips and mouth.   Feeling dizzy.   Dark pee.   Peeing less than normal.   Feeling confused.   Fast breathing or heart rate.   GET HELP RIGHT AWAY IF:   You have blood in your throw up.   You have black or bloody poop (stool).   You have a bad headache or stiff neck.   You feel confused.   You have bad belly (abdominal) pain.   You have chest pain or trouble breathing.   You do not pee at least once every 8 hours.   You have cold, clammy skin.   You keep throwing up after 24 to 48 hours.   You have a fever.   MAKE SURE YOU:   Understand these instructions.   Will watch your condition.   Will get help right away if you are not doing well or get worse.   Document Released: 09/09/2007 Document Revised: 03/12/2011 Document Reviewed: 08/22/2010   Exit[employer] Patient Information 2012 [employer].

## 2011-10-02 NOTE — ED Notes (Signed)
Patient C/O nausea and vomiting. Denies any chest pain. States that he started feeling better at the urgent care. States that he is feeling a little weak but is otherwise back to normal. Skin is warm and dry.  Heart sounds WDL.  BBS Clear.  Abdomen is round, soft and non-tender to palpation. Bowel sounds are present X 4.

## 2011-10-02 NOTE — ED Notes (Signed)
Patient C/o nausea since 1430 today. Ate lunch and began work, Nausea and vomiting began. Went to [facility]. Transferred to  [facility] ED for evaluation due to his cardiac history.

## 2011-12-02 ENCOUNTER — Telehealth: Payer: Self-pay | Admitting: Cardiology

## 2011-12-02 NOTE — Telephone Encounter (Signed)
Spoke with pt. I had received prior authorization request for pt for Crestor 20mg  daily. Old records back to 2008 when pt was seeing Dr Charlies Constable indicate pt was on Crestor 20mg  daily. I cannot access any records prior to 2008.  Pt states that he was on lipitor prior to taking crestor. Pt had muscle aches from lipitor and was changed to crestor. Pt states his "numbers" have also been better since he has been on Crestor.

## 2011-12-02 NOTE — Telephone Encounter (Signed)
Patient returning nurse call, he can be reached at [phone]

## 2011-12-03 NOTE — Telephone Encounter (Signed)
Dr Shirlee Latch has completed prior approval  form for Crestor. Forwarded to HIM to be faxed to [facility] [phone]. Pt is aware of above.

## 2011-12-16 ENCOUNTER — Telehealth: Payer: Self-pay | Admitting: *Deleted

## 2011-12-16 NOTE — Telephone Encounter (Signed)
Per [facility] they informed me that they had not received Prior Authorization for Crestor 20 MG tablet.  Called [place] [facility] [place] [phone] ) to see if they had received the Prior Review/Certification form that was faxed in by Katina Dung on 12/03/2011.  I was informed that they had not received the fax.   I re-faxed the form at 9 am, at 12:25 pm called [employer] [facility] [place] faxed spoke with Kem Parkinson. He informed me that the fax had been received and now is in review.  As of 4:26 pm spoke with Norlene Duel. it has not been reviewed yet. Caralee Ates, CMA  Pt reference [phone]

## 2011-12-17 NOTE — Telephone Encounter (Signed)
Called [employer] [place] back spoke with Joyce Gross, Prior Authorization was approved from 12/16/11-09/10/2014. Caralee Ates, CMA  [facility] spoke Lequita Halt inform her of Prior Authorization had been approved.  She ran it while I was on the phone, insurance excepted the medication.   Caralee Ates, CMA  Called [facility] informing pt his Crestor had been approved.  Caralee Ates, CMA

## 2011-12-21 NOTE — Telephone Encounter (Signed)
LM for pt that crestor has been authorized. I spoke with [facility] [address] to let them know crestor has been authorized.

## 2012-01-06 ENCOUNTER — Encounter: Payer: Self-pay | Admitting: Cardiology

## 2012-03-28 ENCOUNTER — Ambulatory Visit (INDEPENDENT_AMBULATORY_CARE_PROVIDER_SITE_OTHER): Payer: BC Managed Care – PPO | Admitting: Physician Assistant

## 2012-03-28 ENCOUNTER — Encounter: Payer: Self-pay | Admitting: Physician Assistant

## 2012-03-28 VITALS — BP 140/85 | HR 75 | Ht 70.0 in | Wt 236.4 lb

## 2012-03-28 DIAGNOSIS — F29 Unspecified psychosis not due to a substance or known physiological condition: Secondary | ICD-10-CM

## 2012-03-28 DIAGNOSIS — I2510 Atherosclerotic heart disease of native coronary artery without angina pectoris: Secondary | ICD-10-CM

## 2012-03-28 DIAGNOSIS — E785 Hyperlipidemia, unspecified: Secondary | ICD-10-CM

## 2012-03-28 DIAGNOSIS — IMO0001 Reserved for inherently not codable concepts without codable children: Secondary | ICD-10-CM

## 2012-03-28 DIAGNOSIS — M791 Myalgia, unspecified site: Secondary | ICD-10-CM

## 2012-03-28 DIAGNOSIS — R410 Disorientation, unspecified: Secondary | ICD-10-CM

## 2012-03-28 NOTE — Patient Instructions (Addendum)
Your physician has recommended you make the following change in your medication:   HOLD CRESTOR FOR 2 WEEKS. RESTART CRESTOR BACK AT 10MG  DAILY.  TODAY LABS (TOTAL CK LEVEL)  IN 2 MONTHS LABS (LFT/LIPIDS) AFTER CHANGING TO CRESTOR  KEEP APPOINTMENT WITH DR. Shirlee Latch 08/2012  Your physician has requested that you have a carotid duplex. This test is an ultrasound of the carotid arteries in your neck. It looks at blood flow through these arteries that supply the brain with blood. Allow one hour for this exam. There are no restrictions or special instructions.

## 2012-03-28 NOTE — Progress Notes (Signed)
[address], [address] [place], [place]  [address] Phone: [phone], Fax:  [phone]  Date:  03/28/2012   Name:  [name]   DOB:  1950-08-15   MRN:  [id number]  PCP:  Fredirick Maudlin, MD  Primary Cardiologist:  Dr. Marca Ancona  Primary Electrophysiologist:  None    History of Present Illness: [name] is a 61 y.o. male who returns for evaluation of myalgias.  He has a hx of CAD s/p CABG in 2009 as well as HTN, DM, and HL.  Last seen by Dr. Shirlee Latch in 5/13. Sleep study was suggested for assessing for her sleep apnea at that time. The patient was to followup in one year.  He notes increasing arthralgias and myalgias over the last few months.  He felt this way when he was on Lipitor.  He felt much better when he changed to Crestor.  No hematuria.  No chest pain, dyspnea, orthopnea, PND, edema, syncope.  He does note an episode of confusion a couple weeks ago.  He states he was talking to his brother about deer hunting.  He apparently started talking about an alligator.  There was no facial droop or unilateral weakness.  He has not had a recurrence.  Labs (5/11): LDL 21, HDL 38, TGs 339, K 4.1, creatinine 1.2     Wt Readings from Last 3 Encounters:  03/28/12 236 lb 6.4 oz (107.23 kg)  08/24/11 238 lb (107.956 kg)  06/10/10 239 lb (108.41 kg)     Past Medical History  Diagnosis Date  . CAD (coronary artery disease), native coronary artery     Multiple PCI procedures followed by CABG in 2009 with LIMA-LAD, SVG-D, seq SVG-OM and PLV, SVG-PDA. Echo (3/12): EF 60-65%, mild MR.   [name] DM2 (diabetes mellitus, type 2)     Followed by Dr. Chestine Spore  . HLD (hyperlipidemia)   . HTN (hypertension)   . Postoperative atrial fibrillation 2009    Current Outpatient Prescriptions  Medication Sig Dispense Refill  . amitriptyline (ELAVIL) 75 MG tablet Take 75 mg by mouth at bedtime.      [name] aspirin EC 81 MG tablet Take 81 mg by mouth every morning.      .  clopidogrel (PLAVIX) 75 MG tablet Take 75 mg by mouth at bedtime.      [name] ezetimibe (ZETIA) 10 MG tablet Take 10 mg by mouth at bedtime.      . furosemide (LASIX) 40 MG tablet Take 40 mg by mouth every morning.      . insulin lispro protamine-insulin lispro (HUMALOG 75/25) (75-25) 100 UNIT/ML SUSP Inject 40-60 Units into the skin 2 (two) times daily. Takes anywhere from 40-60 units as directed by sliding scale      . metFORMIN (GLUCOPHAGE) 1000 MG tablet Take 1,000 mg by mouth 2 (two) times daily with a meal.      . metoprolol tartrate (LOPRESSOR) 25 MG tablet Take 25 mg by mouth 2 (two) times daily.      . nitroGLYCERIN (NITROSTAT) 0.4 MG SL tablet Place 0.4 mg under the tongue every 5 (five) minutes as needed. For chest pain      . ramipril (ALTACE) 10 MG capsule Take 10 mg by mouth every morning.      . rosuvastatin (CRESTOR) 20 MG tablet Take 20 mg by mouth at bedtime.        Allergies: Allergies  Allergen Reactions  . Penicillins Other (See Comments)    Unknown-Reaction as  child    Social History:  The patient  reports that he has never smoked. He has quit using smokeless tobacco. His smokeless tobacco use included Chew. He reports that he does not drink alcohol or use illicit drugs.   ROS:  Please see the history of present illness.   He notes decreased muscle mass and erectile dysfunction.    All other systems reviewed and negative.   PHYSICAL EXAM: VS:  BP 140/85  Pulse 75  Ht 5\' 10"  (1.778 m)  Wt 236 lb 6.4 oz (107.23 kg)  BMI 33.92 kg/m2  SpO2 96% Well nourished, well developed, in no acute distress HEENT: normal Neck: no JVD Vascular:  No carotid bruits Cardiac:  normal S1, S2; RRR; no murmur Lungs:  clear to auscultation bilaterally, no wheezing, rhonchi or rales Abd: soft, nontender, no hepatomegaly Ext: no edema Skin: warm and dry Neuro:  CNs 2-12 intact, no focal abnormalities noted  ASSESSMENT AND PLAN:  1. Myalgias:   He is on a high dose of Crestor and  Zetia.  Now having symptoms of myalgias like he did with Lipitor.  Check a CK level today.  Hold Crestor for 2 weeks.  Then resume Crestor at a lower dose of 10 mg QD.  Check Lipids and LFTs 2 mos later.  Could consider Pravastatin if symptoms persist on lower dose Crestor.  2. Transient Confusion:  As noted, he had very brief word finding issues.  He is already on Plavix and ASA. Continue these.  I will have him undergo carotid dopplers.  I suggested repeating his Echo but he would like to defer for now.  If he has recurrent or worsening symptoms, he may need to see neurology as well as proceed with an event monitor to rule out AFib.    3. Hyperlipidemia:  Change statin as noted above.  4. Coronary Artery Disease:  No angina.  Continue ASA and Plavix.    5. Hypertension:   Controlled.  Continue current therapy.   6. Erectile Dysfunction:  I have suggested he follow up with his PCP.  He may need his testosterone checked.    7. Disposition:   Follow up with Dr. Marca Ancona in 08/2012.  Luna Glasgow, PA-C  3:53 PM 03/28/2012

## 2012-03-29 ENCOUNTER — Telehealth: Payer: Self-pay | Admitting: *Deleted

## 2012-03-29 LAB — CK: Total CK: 145 U/L (ref 7–232)

## 2012-03-29 NOTE — Telephone Encounter (Signed)
Message copied by Tarri Fuller on Tue Mar 29, 2012 11:32 AM ------      Message from: [place], [place] T      Created: Tue Mar 29, 2012 11:11 AM       Normal.      Continue with current treatment plan.      Tereso Newcomer, PA-C  11:11 AM 03/29/2012

## 2012-03-29 NOTE — Telephone Encounter (Signed)
pt notified about lab results w/verbal understnding today. pt states he forgot to tell Bing Neighbors. PAC at 03/28/12 OV that over the last 4 mnths he has had what he states is a "significant loss of smell". said baked an apple today and he could not smell it.

## 2012-05-01 ENCOUNTER — Other Ambulatory Visit: Payer: Self-pay | Admitting: Cardiology

## 2012-05-02 ENCOUNTER — Encounter (INDEPENDENT_AMBULATORY_CARE_PROVIDER_SITE_OTHER): Payer: BC Managed Care – PPO

## 2012-05-02 DIAGNOSIS — I6529 Occlusion and stenosis of unspecified carotid artery: Secondary | ICD-10-CM

## 2012-05-02 DIAGNOSIS — R410 Disorientation, unspecified: Secondary | ICD-10-CM

## 2012-05-02 DIAGNOSIS — E785 Hyperlipidemia, unspecified: Secondary | ICD-10-CM

## 2012-05-02 DIAGNOSIS — M791 Myalgia, unspecified site: Secondary | ICD-10-CM

## 2012-05-04 ENCOUNTER — Encounter: Payer: Self-pay | Admitting: Physician Assistant

## 2012-05-04 ENCOUNTER — Telehealth: Payer: Self-pay | Admitting: *Deleted

## 2012-05-04 NOTE — Telephone Encounter (Signed)
Message copied by Tarri Fuller on Wed May 04, 2012  2:15 PM ------      Message from: [place], [place] T      Created: Wed May 04, 2012  1:53 PM       Mild plaque bilaterally (0-39%)      Repeat in 1 year      Tereso Newcomer, [place]  1:52 PM 05/04/2012

## 2012-05-04 NOTE — Telephone Encounter (Signed)
lmom carotid mild plaque, repeat carotid 1 yr

## 2012-05-12 ENCOUNTER — Encounter: Payer: Self-pay | Admitting: Gastroenterology

## 2012-05-12 ENCOUNTER — Other Ambulatory Visit: Payer: Self-pay | Admitting: Cardiology

## 2012-05-16 ENCOUNTER — Encounter: Payer: Self-pay | Admitting: *Deleted

## 2012-05-24 ENCOUNTER — Encounter: Payer: Self-pay | Admitting: Gastroenterology

## 2012-05-24 ENCOUNTER — Other Ambulatory Visit: Payer: BC Managed Care – PPO

## 2012-05-24 ENCOUNTER — Telehealth: Payer: Self-pay | Admitting: *Deleted

## 2012-05-24 ENCOUNTER — Ambulatory Visit (INDEPENDENT_AMBULATORY_CARE_PROVIDER_SITE_OTHER): Payer: BC Managed Care – PPO | Admitting: Gastroenterology

## 2012-05-24 VITALS — BP 102/60 | HR 79 | Ht 68.0 in | Wt 240.0 lb

## 2012-05-24 DIAGNOSIS — Z86010 Personal history of colonic polyps: Secondary | ICD-10-CM

## 2012-05-24 DIAGNOSIS — Z800 Family history of malignant neoplasm of digestive organs: Secondary | ICD-10-CM

## 2012-05-24 DIAGNOSIS — E669 Obesity, unspecified: Secondary | ICD-10-CM

## 2012-05-24 DIAGNOSIS — Z951 Presence of aortocoronary bypass graft: Secondary | ICD-10-CM

## 2012-05-24 DIAGNOSIS — Z7901 Long term (current) use of anticoagulants: Secondary | ICD-10-CM

## 2012-05-24 LAB — HEPATITIS C ANTIBODY: HCV Ab: NEGATIVE

## 2012-05-24 MED ORDER — MOVIPREP 100 G PO SOLR: 1.0000 | Freq: Once | ORAL | Status: DC

## 2012-05-24 NOTE — Telephone Encounter (Signed)
05/24/2012    RE: [name] DOB: [birth year] MRN: [id number]   Dear Dr. Shirlee Latch,    We have scheduled the above patient for an endoscopic procedure. Our records show that he is on anticoagulation therapy.   Please advise as to how long the patient may come off his therapy of Plavix prior to the procedure, which is scheduled for 06-22-2012.  Please fax back/ or route the completed form to Landry Dyke. CMA at [phone].   Sincerely,  Ok Anis

## 2012-05-24 NOTE — Patient Instructions (Addendum)
You have been scheduled for a colonoscopy with propofol. Please follow written instructions given to you at your visit today.  Please pick up your prep kit at the pharmacy within the next 1-3 days. If you use inhalers (even only as needed) or a CPAP machine, please bring them with you on the day of your procedure. Please follow diabetic instructions given on prep instructions.  Your physician has requested that you go to the basement for the following lab work before leaving today: Hepatitis C-Antibody.  ____________________________________________________________________________________________________________________  Colonoscopy A colonoscopy is an exam to evaluate your entire colon. In this exam, your colon is cleansed. A long fiberoptic tube is inserted through your rectum and into your colon. The fiberoptic scope (endoscope) is a long bundle of enclosed and very flexible fibers. These fibers transmit light to the area examined and send images from that area to your caregiver. Discomfort is usually minimal. You may be given a drug to help you sleep (sedative) during or prior to the procedure. This exam helps to detect lumps (tumors), polyps, inflammation, and areas of bleeding. Your caregiver may also take a small piece of tissue (biopsy) that will be examined under a microscope. LET YOUR CAREGIVER KNOW ABOUT:   Allergies to food or medicine.  Medicines taken, including vitamins, herbs, eyedrops, over-the-counter medicines, and creams.  Use of steroids (by mouth or creams).  Previous problems with anesthetics or numbing medicines.  History of bleeding problems or blood clots.  Previous surgery.  Other health problems, including diabetes and kidney problems.  Possibility of pregnancy, if this applies. BEFORE THE PROCEDURE   A clear liquid diet may be required for 2 days before the exam.  Ask your caregiver about changing or stopping your regular medications.  Liquid injections  (enemas) or laxatives may be required.  A large amount of electrolyte solution may be given to you to drink over a short period of time. This solution is used to clean out your colon.  You should be present 60 minutes prior to your procedure or as directed by your caregiver. AFTER THE PROCEDURE   If you received a sedative or pain relieving medication, you will need to arrange for someone to drive you home.  Occasionally, there is a little blood passed with the first bowel movement. Do not be concerned. FINDING OUT THE RESULTS OF YOUR TEST Not all test results are available during your visit. If your test results are not back during the visit, make an appointment with your caregiver to find out the results. Do not assume everything is normal if you have not heard from your caregiver or the medical facility. It is important for you to follow up on all of your test results. HOME CARE INSTRUCTIONS   It is not unusual to pass moderate amounts of gas and experience mild abdominal cramping following the procedure. This is due to air being used to inflate your colon during the exam. Walking or a warm pack on your belly (abdomen) may help.  You may resume all normal meals and activities after sedatives and medicines have worn off.  Only take over-the-counter or prescription medicines for pain, discomfort, or fever as directed by your caregiver. Do not use aspirin or blood thinners if a biopsy was taken. Consult your caregiver for medicine usage if biopsies were taken. SEEK IMMEDIATE MEDICAL CARE IF:   You have a fever.  You pass large blood clots or fill a toilet with blood following the procedure. This may also occur 10  to 14 days following the procedure. This is more likely if a biopsy was taken.  You develop abdominal pain that keeps getting worse and cannot be relieved with medicine. Document Released: 03/20/2000 Document Revised: 06/15/2011 Document Reviewed: 11/03/2007 [facility] Patient  Information 2013 [place], [place].

## 2012-05-24 NOTE — Telephone Encounter (Signed)
May hold Plavix x 5 days before procedure, restart afterwards.

## 2012-05-24 NOTE — Progress Notes (Signed)
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  He had a colon polyp removed at the time of colonoscopy in 2006.  He currently is asymptomatic in terms of any GI complaints.  He does have a family history of colon cancer in his mother at age 35, and apparently both his father and one brother has had polyps.  Patient has regular bowel movements and denies melena, hematochezia, or abdominal pain, upper GI or hepatobiliary symptoms.  Lab review shows normal CBC and liver profile, but the patient does have insulin-dependent diabetes which apparently is under poor control.  He is a patient of Dr. Margaretmary Bayley for endocrinology and Dr. Marca Ancona in cardiology.  He denies any significant weight changes over the last 2 years, or any specific food intolerances.. Medical history positive for hyperlipidemia, coronary artery disease, previous stents, and previous bypass surgery.  He denies any current cardiovascular symptoms, angina, or peripheral vascular disease problems.  I have reviewed this patient's present history, medical and surgical past history, allergies and medications.     ROS:   All systems were reviewed and are negative unless otherwise stated in the HPI.... vague history of sleep apnea.  Patient has no dyspnea on exertion, shortness of breath, or decreased exercise tolerance.  He does complain of mild peripheral edema, and has had myalgias associated with statin use.    Physical Exam: Blood pressure 102/60, pulse 79 and regular, and weight 240 pounds with a BMI of 36.5.  96% oxygen saturation on room air. General well developed well nourished patient in no acute distress, appearing their stated age Eyes PERRLA, no icterus, fundoscopic exam per opthamologist Skin no lesions noted.. tattoos noted.  I cannot appreciate stigmata of chronic liver disease or hepatomegaly. Neck supple, no adenopathy, no thyroid enlargement, no tenderness Chest  clear to percussion and auscultation Heart no significant murmurs, gallops or rubs noted Abdomen no hepatosplenomegaly masses or tenderness, BS normal.  No evidence of ventral herniation Rectal inspection normal no fissures, or fistulae noted.  No masses or tenderness on digital exam. Stool guaiac negative. Extremities no acute joint lesions, edema, phlebitis or evidence of cellulitis. Neurologic patient oriented x 3, cranial nerves intact, no focal neurologic deficits noted. Psychological mental status normal and normal affect.  Assessment and plan: This patient is high risk for colon cancer in his history of adenomatous polyps and no followup since 2006,,and a very strong family history of polyps and colon cancer in first-degree relatives.Peggye Form scheduled him for colonoscopy as soon as possible., We will make adjustments in his insulin dosage for this procedure, and we will consult with cardiology to be sure it is okay to hold his Plavix 5 days before this procedure.  He has an appointment to see Dr. Chestine Spore in endocrinology next week.  Otherwise he is to continue medications as listed and reviewed.  Patient also does take aspirin 81 mg a day.  Hepatitis C antibody ordered per his previous tattoos.  No diagnosis found.

## 2012-05-24 NOTE — Telephone Encounter (Signed)
Called patient to tell him he can come off Plavix for five days.  Left message for patient to call back.

## 2012-05-25 ENCOUNTER — Telehealth: Payer: Self-pay | Admitting: *Deleted

## 2012-05-25 NOTE — Telephone Encounter (Signed)
2nd attempt to notify patient about Plavix.  Left message for call back.

## 2012-05-25 NOTE — Telephone Encounter (Signed)
Patient called back and I advised him that it is ok for him to hold his Plavix 5 days before his procedure.  Patient verbalized understanding

## 2012-05-30 ENCOUNTER — Other Ambulatory Visit: Payer: BC Managed Care – PPO

## 2012-06-22 ENCOUNTER — Other Ambulatory Visit: Payer: Self-pay | Admitting: Gastroenterology

## 2012-06-22 ENCOUNTER — Encounter: Payer: Self-pay | Admitting: Gastroenterology

## 2012-06-22 ENCOUNTER — Ambulatory Visit (AMBULATORY_SURGERY_CENTER): Payer: BC Managed Care – PPO | Admitting: Gastroenterology

## 2012-06-22 VITALS — BP 120/71 | HR 55 | Temp 97.7°F | Resp 19 | Ht 68.0 in | Wt 240.0 lb

## 2012-06-22 DIAGNOSIS — Z800 Family history of malignant neoplasm of digestive organs: Secondary | ICD-10-CM

## 2012-06-22 DIAGNOSIS — Z86010 Personal history of colonic polyps: Secondary | ICD-10-CM

## 2012-06-22 DIAGNOSIS — Z1211 Encounter for screening for malignant neoplasm of colon: Secondary | ICD-10-CM

## 2012-06-22 LAB — GLUCOSE, CAPILLARY: Glucose-Capillary: 240 mg/dL — ABNORMAL HIGH (ref 70–99)

## 2012-06-22 LAB — HM COLONOSCOPY

## 2012-06-22 MED ORDER — SODIUM CHLORIDE 0.9 % IV SOLN: 500.0000 mL | INTRAVENOUS | Status: DC

## 2012-06-22 NOTE — Patient Instructions (Addendum)
Discharge instructions given with verbal understanding. Normal exam. Resume previous medications today including anticoagulant. YOU HAD AN ENDOSCOPIC PROCEDURE TODAY AT THE [facility]: Refer to the procedure report that was given to you for any specific questions about what was found during the examination.  If the procedure report does not answer your questions, please call your gastroenterologist to clarify.  If you requested that your care partner not be given the details of your procedure findings, then the procedure report has been included in a sealed envelope for you to review at your convenience later.  YOU SHOULD EXPECT: Some feelings of bloating in the abdomen. Passage of more gas than usual.  Walking can help get rid of the air that was put into your GI tract during the procedure and reduce the bloating. If you had a lower endoscopy (such as a colonoscopy or flexible sigmoidoscopy) you may notice spotting of blood in your stool or on the toilet paper. If you underwent a bowel prep for your procedure, then you may not have a normal bowel movement for a few days.  DIET: Your first meal following the procedure should be a light meal and then it is ok to progress to your normal diet.  A half-sandwich or bowl of soup is an example of a good first meal.  Heavy or fried foods are harder to digest and may make you feel nauseous or bloated.  Likewise meals heavy in dairy and vegetables can cause extra gas to form and this can also increase the bloating.  Drink plenty of fluids but you should avoid alcoholic beverages for 24 hours.  ACTIVITY: Your care partner should take you home directly after the procedure.  You should plan to take it easy, moving slowly for the rest of the day.  You can resume normal activity the day after the procedure however you should NOT DRIVE or use heavy machinery for 24 hours (because of the sedation medicines used during the test).    SYMPTOMS TO REPORT  IMMEDIATELY: A gastroenterologist can be reached at any hour.  During normal business hours, 8:30 AM to 5:00 PM Monday through Friday, call [phone].  After hours and on weekends, please call the GI answering service at [phone] who will take a message and have the physician on call contact you.   Following lower endoscopy (colonoscopy or flexible sigmoidoscopy):  Excessive amounts of blood in the stool  Significant tenderness or worsening of abdominal pains  Swelling of the abdomen that is new, acute  Fever of 100F or higher  FOLLOW UP: If any biopsies were taken you will be contacted by phone or by letter within the next 1-3 weeks.  Call your gastroenterologist if you have not heard about the biopsies in 3 weeks.  Our staff will call the home number listed on your records the next business day following your procedure to check on you and address any questions or concerns that you may have at that time regarding the information given to you following your procedure. This is a courtesy call and so if there is no answer at the home number and we have not heard from you through the emergency physician on call, we will assume that you have returned to your regular daily activities without incident.  SIGNATURES/CONFIDENTIALITY: You and/or your care partner have signed paperwork which will be entered into your electronic medical record.  These signatures attest to the fact that that the information above on your After Visit Summary  has been reviewed and is understood.  Full responsibility of the confidentiality of this discharge information lies with you and/or your care-partner. 

## 2012-06-22 NOTE — Progress Notes (Signed)
Patient did not experience any of the following events: a burn prior to discharge; a fall within the facility; wrong site/side/patient/procedure/implant event; or a hospital transfer or hospital admission upon discharge from the facility. (G8907) Patient did not have preoperative order for IV antibiotic SSI prophylaxis. (G8918)  

## 2012-06-22 NOTE — Op Note (Signed)
[facility] [address] [place], [address]   COLONOSCOPY PROCEDURE REPORT  PATIENT: [name], [name]  MR#: [id number] BIRTHDATE: 23-Sep-1950 , 61  yrs. old GENDER: Male ENDOSCOPIST: Mardella Layman, MD, [facility] REFERRED BY: PROCEDURE DATE:  06/22/2012 PROCEDURE:   Colonoscopy, screening ASA CLASS:   Class III INDICATIONS:Patient's personal history of colon polyps and Patient's immediate family history of colon cancer. MEDICATIONS: Propofol (Diprivan) 160 mg IV  DESCRIPTION OF PROCEDURE:   After the risks and benefits and of the procedure were explained, informed consent was obtained.  A digital rectal exam revealed no abnormalities of the rectum.    The LB [id number]  endoscope was introduced through the anus and advanced to the cecum, which was identified by both the appendix and ileocecal valve .  The quality of the prep was excellent, using MoviPrep .  The instrument was then slowly withdrawn as the colon was fully examined.     COLON FINDINGS: A normal appearing cecum, ileocecal valve, and appendiceal orifice were identified.  The ascending, hepatic flexure, transverse, splenic flexure, descending, sigmoid colon and rectum appeared unremarkable.  No polyps or cancers were seen. Retroflexed views revealed no abnormalities.     The scope was then withdrawn from the patient and the procedure completed.  COMPLICATIONS: There were no complications. ENDOSCOPIC IMPRESSION: Normal colon...no polyps noted.  RECOMMENDATIONS: 1.  Continue current medications 2.  Repeat Colonoscopy in 5 years.   REPEAT EXAM:  cc:Ed Juanetta Gosling, MD  _______________________________ eSigned:  Mardella Layman, MD, [facility] 06/22/2012 8:59 AM

## 2012-06-22 NOTE — Progress Notes (Signed)
Report to pacu rn, vss, bbs=clear 

## 2012-06-23 ENCOUNTER — Telehealth: Payer: Self-pay | Admitting: *Deleted

## 2012-06-23 NOTE — Telephone Encounter (Signed)
  Follow up Call-  Call back number 06/22/2012  Post procedure Call Back phone  # [phone]  Permission to leave phone message Yes     Patient questions:  Do you have a fever, pain , or abdominal swelling? no Pain Score  0 *  Have you tolerated food without any problems? yes  Have you been able to return to your normal activities? yes  Do you have any questions about your discharge instructions: Diet   no Medications  no Follow up visit  no  Do you have questions or concerns about your Care? no  Actions: * If pain score is 4 or above: No action needed, pain <4.

## 2012-10-31 ENCOUNTER — Other Ambulatory Visit: Payer: Self-pay | Admitting: Cardiology

## 2012-11-09 ENCOUNTER — Telehealth: Payer: Self-pay | Admitting: *Deleted

## 2012-11-09 NOTE — Telephone Encounter (Signed)
Spoke with [name] at [place] [phone] pt ID # [id number]. Crestor requires prior authorization, her records show there was a lapse in coverage. She will fax prior authorization form for Dr Shirlee Latch to complete.

## 2012-11-09 NOTE — Telephone Encounter (Signed)
[facility] calling about Prior Auth for Crestor per insurance. They left no further information. I will forward this message to Dr Alford Highland nurse.   Micki Riley, CMA

## 2012-11-09 NOTE — Telephone Encounter (Signed)
Prior authorization for crestor completed and faxed to [facility].

## 2012-11-11 ENCOUNTER — Telehealth: Payer: Self-pay | Admitting: Radiology

## 2012-11-11 MED ORDER — PERMETHRIN 5 % EX CREA: TOPICAL_CREAM | Freq: Once | CUTANEOUS | Status: DC

## 2012-11-11 NOTE — Telephone Encounter (Signed)
[id number] wife [name] wants to know if husband can also be treated for scabies as well, she was seen and treated last pm. Now he has same problem. Advised her will ask, we can not do the prednisone w/o office visit, but will ask about the cream for scabies. Please advise

## 2012-11-11 NOTE — Telephone Encounter (Signed)
Prescription I provided for wife, [name], actually should be enough for her and him.  If symptoms persist, they will need repeat dose in two weeks. Does she have any remaining Elimite cream/lotion or did she use it all? I will send in rx for him if needed.

## 2012-11-12 NOTE — Telephone Encounter (Signed)
Spoke to pt- she was not clear on the instructions.  Confirmed application of cream will treat the scabies. Pt will pick up the cream today.

## 2012-11-16 NOTE — Telephone Encounter (Signed)
I spoke with [name] at [facility]. They do not show a prior authorization request for Crestor has been received. She asked me to refax the request to [phone].

## 2012-11-23 NOTE — Telephone Encounter (Signed)
Spoke with [name] at [phone]. Prior authorization still not received. Will refax.

## 2012-11-29 NOTE — Telephone Encounter (Signed)
Received denial for crestor from [employer].  Will review with Dr Shirlee Latch.

## 2012-11-30 ENCOUNTER — Other Ambulatory Visit: Payer: Self-pay | Admitting: Cardiology

## 2012-11-30 NOTE — Telephone Encounter (Signed)
Appeal of denial for crestor faxed to [facility].

## 2012-12-06 NOTE — Telephone Encounter (Signed)
Received approval from [facility] [phone] for Crestor 11/30/12-04/05/38. Pharmacy and patient aware.

## 2012-12-19 ENCOUNTER — Other Ambulatory Visit: Payer: Self-pay | Admitting: Cardiology

## 2012-12-30 ENCOUNTER — Other Ambulatory Visit: Payer: Self-pay | Admitting: Cardiology

## 2013-02-07 ENCOUNTER — Encounter: Payer: Self-pay | Admitting: Physician Assistant

## 2013-02-07 ENCOUNTER — Ambulatory Visit (INDEPENDENT_AMBULATORY_CARE_PROVIDER_SITE_OTHER): Payer: BC Managed Care – PPO | Admitting: Physician Assistant

## 2013-02-07 ENCOUNTER — Encounter (INDEPENDENT_AMBULATORY_CARE_PROVIDER_SITE_OTHER): Payer: Self-pay

## 2013-02-07 VITALS — BP 142/88 | HR 69 | Ht 70.0 in | Wt 238.0 lb

## 2013-02-07 DIAGNOSIS — E785 Hyperlipidemia, unspecified: Secondary | ICD-10-CM

## 2013-02-07 DIAGNOSIS — N529 Male erectile dysfunction, unspecified: Secondary | ICD-10-CM

## 2013-02-07 DIAGNOSIS — R079 Chest pain, unspecified: Secondary | ICD-10-CM

## 2013-02-07 DIAGNOSIS — I10 Essential (primary) hypertension: Secondary | ICD-10-CM

## 2013-02-07 DIAGNOSIS — I2510 Atherosclerotic heart disease of native coronary artery without angina pectoris: Secondary | ICD-10-CM

## 2013-02-07 MED ORDER — FUROSEMIDE 40 MG PO TABS: 40.0000 mg | ORAL_TABLET | Freq: Every day | ORAL | Status: DC

## 2013-02-07 MED ORDER — EZETIMIBE 10 MG PO TABS: 10.0000 mg | ORAL_TABLET | Freq: Every day | ORAL | Status: DC

## 2013-02-07 MED ORDER — PRAVASTATIN SODIUM 40 MG PO TABS: 40.0000 mg | ORAL_TABLET | Freq: Every evening | ORAL | Status: DC

## 2013-02-07 MED ORDER — CLOPIDOGREL BISULFATE 75 MG PO TABS: 75.0000 mg | ORAL_TABLET | Freq: Every day | ORAL | Status: DC

## 2013-02-07 MED ORDER — METOPROLOL TARTRATE 25 MG PO TABS: 25.0000 mg | ORAL_TABLET | Freq: Two times a day (BID) | ORAL | Status: DC

## 2013-02-07 MED ORDER — RAMIPRIL 10 MG PO CAPS: 10.0000 mg | ORAL_CAPSULE | Freq: Every morning | ORAL | Status: DC

## 2013-02-07 MED ORDER — NITROGLYCERIN 0.4 MG SL SUBL: 0.4000 mg | SUBLINGUAL_TABLET | SUBLINGUAL | Status: DC | PRN

## 2013-02-07 NOTE — Progress Notes (Signed)
[address], [address] [place], [place]  [address] Phone: [phone] Fax:  [phone]  Date:  02/07/2013   ID:  [name], DOB [birth year], MRN [id number]  PCP:  Fredirick Maudlin, MD  Cardiologist:  Dr. Marca Ancona     History of Present Illness: [name] is a 62 y.o. male with a hx of CAD s/p CABG in 2009 as well as HTN, T2DM, and HL.  I saw him 03/2012 for arthralgias and myalgias.  I adjusted his Crestor.  Carotid US (04/2012):  Mild plaque bilaterally (0-39%) => repeat in 1 year.    He is overall doing well.  His insurance will no longer cover his Crestor.  He needs to change medications.  He could not tolerate Lipitor in the past due to myalgias.  He has noted occasional episodes of throat burning with over-exertion.  This is his anginal equivalent.  The symptoms quickly resolve and he has not taken NTG.  No assoc symptoms.  He can exert himself at other times without developing symptoms.  He denies orthopnea, PND, edema.  No syncope.    Recent Labs: No results found for requested labs within last 365 days.   Wt Readings from Last 3 Encounters:  02/07/13 238 lb (107.956 kg)  06/22/12 240 lb (108.863 kg)  05/24/12 240 lb (108.863 kg)     Past Medical History  Diagnosis Date  . CAD (coronary artery disease), native coronary artery     Multiple PCI procedures followed by CABG in 2009 with LIMA-LAD, SVG-D, seq SVG-OM and PLV, SVG-PDA. Echo (3/12): EF 60-65%, mild MR.   [name] DM2 (diabetes mellitus, type 2)     Followed by Dr. Chestine Spore  . HLD (hyperlipidemia)   . HTN (hypertension)   . Postoperative atrial fibrillation 2009  . Carotid stenosis     dopplers 1/14:  0-39% bilat  . Colon polyps 2006    Colonoscopy-Dr. Patterson(Tubular Adenoma)   . Cataract     REMOVED    Current Outpatient Prescriptions  Medication Sig Dispense Refill  . amitriptyline (ELAVIL) 75 MG tablet Take 75 mg by mouth at bedtime.      [name] aspirin EC 81 MG tablet Take 81 mg by mouth  every morning.      . clopidogrel (PLAVIX) 75 MG tablet TAKE 1 TABLET BY MOUTH EVERY DAY  30 tablet  1  . CRESTOR 10 MG tablet TAKE 1 TABLET EVERY DAY  60 tablet  0  . furosemide (LASIX) 40 MG tablet TAKE 1 TABLET EVERY DAY  90 tablet  0  . insulin lispro protamine-insulin lispro (HUMALOG 75/25) (75-25) 100 UNIT/ML SUSP Inject 40-60 Units into the skin 2 (two) times daily. Takes anywhere from 40-60 units as directed by sliding scale      . metFORMIN (GLUCOPHAGE) 1000 MG tablet Take 1,000 mg by mouth 2 (two) times daily with a meal.      . metoprolol tartrate (LOPRESSOR) 25 MG tablet Take 25 mg by mouth 2 (two) times daily.      . nitroGLYCERIN (NITROSTAT) 0.4 MG SL tablet Place 0.4 mg under the tongue every 5 (five) minutes as needed. For chest pain      . ramipril (ALTACE) 10 MG capsule Take 10 mg by mouth every morning.      [name] ZETIA 10 MG tablet TAKE 1 TABLET BY MOUTH EVERY DAY  90 tablet  1   No current facility-administered medications for this visit.    Allergies:  Penicillins   Social History:  The patient  reports that he has never smoked. He quit smokeless tobacco use about 19 years ago. His smokeless tobacco use included Chew. He reports that he does not drink alcohol or use illicit drugs.   Family History:  The patient's family history includes Colon cancer in his mother; Coronary artery disease in an other family member; Diabetes in his brother and father; Heart disease in an other family member; Other in his brother and brother; Prostate cancer in his brother and father.   ROS:  Please see the history of present illness.  He is having problems achieving erections.    All other systems reviewed and negative.   PHYSICAL EXAM: VS:  BP 142/88  Pulse 69  Ht 5\' 10"  (1.778 m)  Wt 238 lb (107.956 kg)  BMI 34.15 kg/m2 Well nourished, well developed, in no acute distress HEENT: normal Neck: no JVD Vascular:  No carotid bruits Cardiac:  normal S1, S2; RRR; no murmur Lungs:  clear  to auscultation bilaterally, no wheezing, rhonchi or rales Abd: soft, nontender, no hepatomegaly Ext: no edema Skin: warm and dry Neuro:  CNs 2-12 intact, no focal abnormalities noted  EKG:  NSR, HR 69, inferior Q waves, no change from prior tracing     ASSESSMENT AND PLAN:  1. Chest Pain:  Symptoms somewhat reminiscent of his prior angina but inconsistent with exertion and overall mild.  No changes on his ECG.  It has been 7+ years since his CABG.  Will arrange ETT-Myoview.  As his symptoms are inconsistent, I will not adjust his antianginals.   2. CAD:  Arrange ETT-Myoview.  Continue ASA, Plavix, beta blocker, statin. 3. Hypertension:  Fair control. Continue current Rx. Arrange f/u BMET. 4. Hyperlipidemia:  Change Crestor to Pravastatin 40 QHS.  Check Lipids and LFTs in 6 weeks.   5. Erectile Dysfunction:  He does not take long acting nitrates.  He should be able to take PDE-5 Inhibitors.  He can discuss this further with his PCP.   6. Disposition:  F/u with Dr. Marca Ancona in 3 mos.   Signed, Tereso Newcomer, PA-C  02/07/2013 12:21 PM

## 2013-02-07 NOTE — Patient Instructions (Signed)
REFILLS WERE SENT IN TODAY FOR PLAVIX, LASIX, METOPROLOL, RAMIPRIL, ZETIA, NTG  STOP CRESTOR   START PRAVASTATIN 40 MG 1 TABLET EVERY NIGHT  LABS TO BE DONE 6 WEEKS AFTER STARTING PRAVSTATIN  PLEASE SCHEDULE EXERCISE MYOVIEW; DX 786.50  PLEASE FOLLOW UP WITH DR. Shirlee Latch IN 3 MONTHS

## 2013-03-13 ENCOUNTER — Ambulatory Visit (HOSPITAL_COMMUNITY): Payer: BC Managed Care – PPO | Attending: Cardiology | Admitting: Radiology

## 2013-03-13 ENCOUNTER — Encounter: Payer: Self-pay | Admitting: Cardiology

## 2013-03-13 VITALS — BP 154/86 | HR 72 | Ht 70.0 in | Wt 236.0 lb

## 2013-03-13 DIAGNOSIS — I4891 Unspecified atrial fibrillation: Secondary | ICD-10-CM | POA: Insufficient documentation

## 2013-03-13 DIAGNOSIS — R070 Pain in throat: Secondary | ICD-10-CM | POA: Insufficient documentation

## 2013-03-13 DIAGNOSIS — Z8249 Family history of ischemic heart disease and other diseases of the circulatory system: Secondary | ICD-10-CM | POA: Insufficient documentation

## 2013-03-13 DIAGNOSIS — I2510 Atherosclerotic heart disease of native coronary artery without angina pectoris: Secondary | ICD-10-CM | POA: Insufficient documentation

## 2013-03-13 DIAGNOSIS — E109 Type 1 diabetes mellitus without complications: Secondary | ICD-10-CM | POA: Insufficient documentation

## 2013-03-13 DIAGNOSIS — Z951 Presence of aortocoronary bypass graft: Secondary | ICD-10-CM | POA: Insufficient documentation

## 2013-03-13 DIAGNOSIS — G4733 Obstructive sleep apnea (adult) (pediatric): Secondary | ICD-10-CM | POA: Insufficient documentation

## 2013-03-13 DIAGNOSIS — I10 Essential (primary) hypertension: Secondary | ICD-10-CM | POA: Insufficient documentation

## 2013-03-13 DIAGNOSIS — E785 Hyperlipidemia, unspecified: Secondary | ICD-10-CM | POA: Insufficient documentation

## 2013-03-13 DIAGNOSIS — R079 Chest pain, unspecified: Secondary | ICD-10-CM

## 2013-03-13 DIAGNOSIS — I779 Disorder of arteries and arterioles, unspecified: Secondary | ICD-10-CM | POA: Insufficient documentation

## 2013-03-13 MED ORDER — TECHNETIUM TC 99M SESTAMIBI GENERIC - CARDIOLITE
33.0000 | Freq: Once | INTRAVENOUS | Status: AC | PRN
  Administered 2013-03-13: 33 via INTRAVENOUS

## 2013-03-13 MED ORDER — TECHNETIUM TC 99M SESTAMIBI GENERIC - CARDIOLITE
11.0000 | Freq: Once | INTRAVENOUS | Status: AC | PRN
  Administered 2013-03-13: 11 via INTRAVENOUS

## 2013-03-13 NOTE — Progress Notes (Signed)
[facility] SITE 3 NUCLEAR MED [address] [place], [place] [address] [phone]    Cardiology Nuclear Med Study  [name] is a 62 y.o. male     MRN : [id number]     DOB: 02-23-1951  Procedure Date: 03/13/2013  Nuclear Med Background Indication for Stress Test:  Evaluation for Ischemia and Graft Patency History:  CAD, CABG 2009, Stents LAD and RCA, Afib, Echo 2012 EF 60-65%, OSA, MPI ~10 years ago Cardiac Risk Factors: Carotid Disease, Family History - CAD, Hypertension, IDDM Type 1 and Lipids  Symptoms:  Throat burning with over-exertion   Nuclear Pre-Procedure Caffeine/Decaff Intake:  None NPO After: 10:00pm   Lungs:  clear O2 Sat: 95% on room air. IV 0.9% NS with Angio Cath:  20g  IV Site: R Hand  IV Started by:  Cathlyn Parsons, RN  Chest Size (in):  52 Cup Size: n/a  Height: 5\' 10"  (1.778 m)  Weight:  236 lb (107.049 kg)  BMI:  Body mass index is 33.86 kg/(m^2). Tech Comments:  No Metoprolol x 24 hrs    Nuclear Med Study 1 or 2 day study: 1 day  Stress Test Type:  Stress  Reading MD: Marca Ancona, MD  Order Authorizing Provider:  Fransico Meadow  Resting Radionuclide: Technetium 18m Sestamibi  Resting Radionuclide Dose: 11.0 mCi   Stress Radionuclide:  Technetium 12m Sestamibi  Stress Radionuclide Dose: 33.0 mCi           Stress Protocol Rest HR: 72 Stress HR: 144  Rest BP: 154/86 Stress BP: 188/92  Exercise Time (min): 9:00 METS: 10.4   Predicted Max HR: 160 bpm % Max HR: 90 bpm Rate Pressure Product: 21308   Dose of Adenosine (mg):  n/a Dose of Lexiscan: n/a mg  Dose of Atropine (mg): n/a Dose of Dobutamine: n/a mcg/kg/min (at max HR)  Stress Test Technologist: Nelson Chimes, BS-ES  Nuclear Technologist:  Doyne Keel, CNMT     Rest Procedure:  Myocardial perfusion imaging was performed at rest 45 minutes following the intravenous administration of Technetium 57m Sestamibi. Rest ECG: NSR - Normal EKG  Stress Procedure:  The  patient exercised on the treadmill utilizing the Bruce Protocol for 9:00 minutes. The patient stopped due to fatigue and denied any chest pain.  Technetium 48m Sestamibi was injected at peak exercise and myocardial perfusion imaging was performed after a brief delay. Stress ECG: See below.   QPS Raw Data Images:  Normal; no motion artifact; normal heart/lung ratio. Stress Images:  Medium-sized, moderate basal to mid anteroseptal perfusion defect. Rest Images:  Small, mild mid anteroseptal perfusion defect.  Subtraction (SDS):  Primarily reversible medium-sized basal to mid anteroseptal perfusion defect.  Transient Ischemic Dilatation (Normal <1.22):  0.96 Lung/Heart Ratio (Normal <0.45):  0.42  Quantitative Gated Spect Images QGS EDV:  117 ml QGS ESV:  54 ml  Impression Exercise Capacity:  Good exercise capacity. BP Response:  Normal blood pressure response. Clinical Symptoms:  Fatigue, no chest pain.  ECG Impression:  There was 1 mm ST depression in inferior leads (nonspecific), < 1 mm ST depression in I and V6., ST elevation 1 mm in V1.  Comparison with Prior Nuclear Study: No images to compare  Overall Impression:  Intermediate risk stress nuclear study with a primarily reversible, medium-sized moderate basal to mid anteroseptal perfusion defect.  LV Ejection Fraction: 54%.  LV Wall Motion:  Mild septal hypokinesis.   Marca Ancona 03/13/2013

## 2013-03-15 ENCOUNTER — Encounter: Payer: Self-pay | Admitting: Physician Assistant

## 2013-03-23 ENCOUNTER — Ambulatory Visit (INDEPENDENT_AMBULATORY_CARE_PROVIDER_SITE_OTHER): Payer: BC Managed Care – PPO | Admitting: *Deleted

## 2013-03-23 ENCOUNTER — Ambulatory Visit (INDEPENDENT_AMBULATORY_CARE_PROVIDER_SITE_OTHER): Payer: BC Managed Care – PPO | Admitting: Physician Assistant

## 2013-03-23 ENCOUNTER — Encounter: Payer: Self-pay | Admitting: Physician Assistant

## 2013-03-23 VITALS — BP 118/60 | HR 72 | Ht 70.0 in | Wt 241.0 lb

## 2013-03-23 DIAGNOSIS — I10 Essential (primary) hypertension: Secondary | ICD-10-CM

## 2013-03-23 DIAGNOSIS — E785 Hyperlipidemia, unspecified: Secondary | ICD-10-CM

## 2013-03-23 DIAGNOSIS — I2510 Atherosclerotic heart disease of native coronary artery without angina pectoris: Secondary | ICD-10-CM

## 2013-03-23 DIAGNOSIS — E1149 Type 2 diabetes mellitus with other diabetic neurological complication: Secondary | ICD-10-CM

## 2013-03-23 LAB — LDL CHOLESTEROL, DIRECT: Direct LDL: 87.7 mg/dL

## 2013-03-23 LAB — BASIC METABOLIC PANEL
BUN: 14 mg/dL (ref 6–23)
Calcium: 9 mg/dL (ref 8.4–10.5)
Creatinine, Ser: 1.1 mg/dL (ref 0.4–1.5)
GFR: 74.9 mL/min (ref >60.00)
Glucose, Bld: 261 mg/dL — ABNORMAL HIGH (ref 70–99)
Potassium: 4.3 mEq/L (ref 3.5–5.1)
Sodium: 138 mEq/L (ref 135–145)

## 2013-03-23 LAB — LIPID PANEL
Cholesterol: 141 mg/dL (ref 0–200)
HDL: 35.8 mg/dL — ABNORMAL LOW (ref >39.00)
Total CHOL/HDL Ratio: 4
Triglycerides: 306 mg/dL — ABNORMAL HIGH (ref 0.0–149.0)
VLDL: 61.2 mg/dL — ABNORMAL HIGH (ref 0.0–40.0)

## 2013-03-23 LAB — HEMOGLOBIN A1C: Hgb A1c MFr Bld: 10.2 % — ABNORMAL HIGH (ref 4.6–6.5)

## 2013-03-23 LAB — HEPATIC FUNCTION PANEL
Alkaline Phosphatase: 71 U/L (ref 39–117)
Bilirubin, Direct: 0 mg/dL (ref 0.0–0.3)
Total Bilirubin: 0.4 mg/dL (ref 0.3–1.2)
Total Protein: 6.3 g/dL (ref 6.0–8.3)

## 2013-03-23 MED ORDER — METOPROLOL TARTRATE 50 MG PO TABS: 50.0000 mg | ORAL_TABLET | Freq: Two times a day (BID) | ORAL | Status: DC

## 2013-03-23 MED ORDER — PRAVASTATIN SODIUM 80 MG PO TABS: 80.0000 mg | ORAL_TABLET | Freq: Every evening | ORAL | Status: DC

## 2013-03-23 NOTE — Patient Instructions (Signed)
Your physician recommends that you schedule a follow-up appointment in: AS SCHEDULED  INCREASE METOPROLOL TO 50 MG TWICE DAILY  INCREASE PRAVASTATIN 80 MG ONCE DAILY  Your physician recommends that you return for lab work in: 3 MONTHS = DO NOT EAT PRIOR TO LABS

## 2013-03-23 NOTE — Progress Notes (Signed)
[address] [place], [place]  [address] Phone: [phone] Fax:  [phone]  Date:  03/23/2013   ID:  [name], DOB 11-Nov-1950, MRN [id number]  PCP:  Fredirick Maudlin, MD  Cardiologist:  Dr. Marca Ancona     History of Present Illness: [name] is a 62 y.o. male with a hx of CAD s/p CABG in 2009 as well as HTN, T2DM, and HL.  Carotid US (04/2012):  Mild plaque bilaterally (0-39%) => repeat in 1 year.   I saw him last month for follow up.  He noted some intermittent exertional throat burning.  I arranged a stress test.  ETT-Myoview (03/14/13):  Ex 9:00, ECG with 1 mm ST depression inferiorly, primarily reversible medium-sized anteroseptal defect, EF 54% (intermediate risk).  I spoke to the patient by phone and he was asymptomatic.  He returns for follow up.    He is here with his wife.  He has not had any further symptoms.  He denies chest pain with exertion.  He denies throat symptoms.  He has been deer hunting and has gone up a large hills without stopping.  He denies syncope.  He denies orthopnea, PND, edema.    Recent Labs: 03/23/2013: ALT 30; Creatinine 1.1; HDL 35.80*; Potassium 4.3 69/62/9528: Direct LDL 87.7   Wt Readings from Last 3 Encounters:  03/23/13 241 lb (109.317 kg)  03/13/13 236 lb (107.049 kg)  02/07/13 238 lb (107.956 kg)     Past Medical History  Diagnosis Date  . CAD (coronary artery disease), native coronary artery     Multiple PCI procedures followed by CABG in 2009 with LIMA-LAD, SVG-D, seq SVG-OM and PLV, SVG-PDA. Echo (3/12): EF 60-65%, mild MR.   [name] DM2 (diabetes mellitus, type 2)     Followed by Dr. Chestine Spore  . HLD (hyperlipidemia)   . HTN (hypertension)   . Postoperative atrial fibrillation 2009  . Carotid stenosis     dopplers 1/14:  0-39% bilat  . Colon polyps 2006    Colonoscopy-Dr. Patterson(Tubular Adenoma)   . Cataract     REMOVED  . Hx of cardiovascular stress test     ETT-Myoview (03/2013):  Anteroseptal  reversible defect, EF 54%, intermediate risk.    Current Outpatient Prescriptions  Medication Sig Dispense Refill  . amitriptyline (ELAVIL) 75 MG tablet Take 75 mg by mouth at bedtime.      [name] aspirin 325 MG tablet Take 325 mg by mouth daily.      . clopidogrel (PLAVIX) 75 MG tablet Take 1 tablet (75 mg total) by mouth daily.  90 tablet  3  . ezetimibe (ZETIA) 10 MG tablet Take 1 tablet (10 mg total) by mouth daily.  90 tablet  3  . furosemide (LASIX) 40 MG tablet Take 1 tablet (40 mg total) by mouth daily.  90 tablet  3  . insulin lispro protamine-insulin lispro (HUMALOG 75/25) (75-25) 100 UNIT/ML SUSP Inject 40-60 Units into the skin 2 (two) times daily. Takes anywhere from 40-60 units as directed by sliding scale      . metFORMIN (GLUCOPHAGE) 1000 MG tablet Take 1,000 mg by mouth 2 (two) times daily with a meal.      . metoprolol tartrate (LOPRESSOR) 25 MG tablet Take 1 tablet (25 mg total) by mouth 2 (two) times daily.  180 tablet  3  . nitroGLYCERIN (NITROSTAT) 0.4 MG SL tablet Place 1 tablet (0.4 mg total) under the tongue every 5 (five) minutes as needed. For  chest pain  25 tablet  3  . pravastatin (PRAVACHOL) 40 MG tablet Take 1 tablet (40 mg total) by mouth every evening.  90 tablet  3  . ramipril (ALTACE) 10 MG capsule Take 1 capsule (10 mg total) by mouth every morning.  90 capsule  3   No current facility-administered medications for this visit.    Allergies:   Penicillins   Social History:  The patient  reports that he has never smoked. He quit smokeless tobacco use about 19 years ago. His smokeless tobacco use included Chew. He reports that he does not drink alcohol or use illicit drugs.   Family History:  The patient's family history includes Colon cancer in his mother; Coronary artery disease in an other family member; Diabetes in his brother and father; Heart disease in an other family member; Other in his brother and brother; Prostate cancer in his brother and father.    ROS:  Please see the history of present illness.   All other systems reviewed and negative.   PHYSICAL EXAM: VS:  BP 118/60  Pulse 72  Ht 5\' 10"  (1.778 m)  Wt 241 lb (109.317 kg)  BMI 34.58 kg/m2 Well nourished, well developed, in no acute distress HEENT: normal Neck: no JVD Vascular:  No carotid bruits Cardiac:  normal S1, S2; RRR; no murmur Lungs:  clear to auscultation bilaterally, no wheezing, rhonchi or rales Abd: soft, nontender, no hepatomegaly Ext: no edema Skin: warm and dry Neuro:  CNs 2-12 intact, no focal abnormalities noted  EKG:   NSR, HR 72, normal axis, nonspecific ST-T wave changes, no change from prior tracing  ASSESSMENT AND PLAN:  1. CAD:   He denies any symptoms consistent with angina. His Myoview recently was intermediate risk. I reviewed this with Dr. Shirlee Latch who also saw the patient today. Given the patient has no symptoms to suggest angina, we have recommended continued medical therapy. If he should have symptoms suggestive of angina, we could then proceed with cardiac catheterization. We have both reviewed this with the patient and his wife today. The patient agrees. We will increase his metoprolol to 50 mg twice a day.  Continue ASA, Plavix, statin. 2. Hypertension:  Controlled.  3. Hyperlipidemia:  Increase Pravastatin to 80 mg QHS.  Check Lipids and LFTs in 3 months.   4. Disposition:  F/u with Dr. Marca Ancona in 05/2013 as planned.    Signed, Tereso Newcomer, PA-C  03/23/2013 3:39 PM

## 2013-03-24 ENCOUNTER — Other Ambulatory Visit: Payer: Self-pay | Admitting: *Deleted

## 2013-03-24 DIAGNOSIS — E7800 Pure hypercholesterolemia, unspecified: Secondary | ICD-10-CM

## 2013-05-24 ENCOUNTER — Ambulatory Visit: Payer: BC Managed Care – PPO | Admitting: Cardiology

## 2013-05-24 ENCOUNTER — Other Ambulatory Visit: Payer: BC Managed Care – PPO

## 2013-05-25 ENCOUNTER — Other Ambulatory Visit: Payer: Self-pay | Admitting: Cardiology

## 2013-06-01 ENCOUNTER — Ambulatory Visit: Payer: BC Managed Care – PPO | Admitting: Cardiology

## 2013-06-01 ENCOUNTER — Other Ambulatory Visit: Payer: BC Managed Care – PPO

## 2013-06-05 ENCOUNTER — Other Ambulatory Visit: Payer: Self-pay | Admitting: Cardiology

## 2013-06-06 ENCOUNTER — Other Ambulatory Visit (INDEPENDENT_AMBULATORY_CARE_PROVIDER_SITE_OTHER): Payer: BC Managed Care – PPO

## 2013-06-06 DIAGNOSIS — E7800 Pure hypercholesterolemia, unspecified: Secondary | ICD-10-CM

## 2013-06-06 LAB — LIPID PANEL
CHOL/HDL RATIO: 5
Cholesterol: 182 mg/dL (ref 0–200)
HDL: 39.6 mg/dL (ref >39.00)
LDL Cholesterol: 42 mg/dL (ref 0–99)
Triglycerides: 501 mg/dL — ABNORMAL HIGH (ref 0.0–149.0)
VLDL: 100.2 mg/dL — AB (ref 0.0–40.0)

## 2013-06-06 LAB — HEPATIC FUNCTION PANEL
ALBUMIN: 3.9 g/dL (ref 3.5–5.2)
ALT: 35 U/L (ref 0–53)
AST: 38 U/L — AB (ref 0–37)
Alkaline Phosphatase: 74 U/L (ref 39–117)
Bilirubin, Direct: 0 mg/dL (ref 0.0–0.3)
Total Bilirubin: 0.7 mg/dL (ref 0.3–1.2)
Total Protein: 6.7 g/dL (ref 6.0–8.3)

## 2013-06-12 ENCOUNTER — Other Ambulatory Visit: Payer: Self-pay | Admitting: *Deleted

## 2013-06-12 ENCOUNTER — Telehealth: Payer: Self-pay | Admitting: Cardiology

## 2013-06-12 DIAGNOSIS — I10 Essential (primary) hypertension: Secondary | ICD-10-CM

## 2013-06-12 DIAGNOSIS — E785 Hyperlipidemia, unspecified: Secondary | ICD-10-CM

## 2013-06-12 DIAGNOSIS — I2510 Atherosclerotic heart disease of native coronary artery without angina pectoris: Secondary | ICD-10-CM

## 2013-06-12 MED ORDER — FENOFIBRATE 54 MG PO TABS: 54.0000 mg | ORAL_TABLET | Freq: Every day | ORAL | Status: DC

## 2013-06-12 NOTE — Telephone Encounter (Signed)
Spoke with patient about recent lab results 

## 2013-06-12 NOTE — Telephone Encounter (Signed)
°  Patient is returning your call, please call back.  °

## 2013-08-06 ENCOUNTER — Ambulatory Visit (INDEPENDENT_AMBULATORY_CARE_PROVIDER_SITE_OTHER): Payer: BC Managed Care – PPO | Admitting: Emergency Medicine

## 2013-08-06 VITALS — BP 134/80 | HR 72 | Temp 98.2°F | Resp 14 | Ht 67.0 in | Wt 242.0 lb

## 2013-08-06 DIAGNOSIS — E11621 Type 2 diabetes mellitus with foot ulcer: Secondary | ICD-10-CM

## 2013-08-06 DIAGNOSIS — E1169 Type 2 diabetes mellitus with other specified complication: Secondary | ICD-10-CM

## 2013-08-06 DIAGNOSIS — L97529 Non-pressure chronic ulcer of other part of left foot with unspecified severity: Principal | ICD-10-CM

## 2013-08-06 DIAGNOSIS — L97519 Non-pressure chronic ulcer of other part of right foot with unspecified severity: Principal | ICD-10-CM

## 2013-08-06 DIAGNOSIS — L97509 Non-pressure chronic ulcer of other part of unspecified foot with unspecified severity: Secondary | ICD-10-CM

## 2013-08-06 MED ORDER — CIPROFLOXACIN HCL 500 MG PO TABS: 500.0000 mg | ORAL_TABLET | Freq: Two times a day (BID) | ORAL | Status: DC

## 2013-08-06 NOTE — Progress Notes (Signed)
[facility] [address], [place] [address] [phone]  Date:  08/06/2013   Name:  [name] EddyRobert W. [name]   DOB:  08/19/1950   MRN:  [id number]  PCP:  Fredirick MaudlinHAWKINS,EDWARD L, MD    Chief Complaint: Wound Check   History of Present Illness:  [name] EddyRobert W. Vallely is a 63 y.o. very pleasant male patient who presents with the following:  History of Type 2 DM with previous diabetic ulcer and has been ignoring it for a week.  It is now draining.  No fever or chills.  No nausea or vomiting.  No pain.  Sugars are "stable".  No improvement with over the counter medications or other home remedies. Denies other complaint or health concern today.   Patient Active Problem List   Diagnosis Date Noted  . OSA (obstructive sleep apnea) 08/25/2011  . DM 06/03/2008  . HYPERLIPIDEMIA-MIXED 06/03/2008  . HYPERTENSION, BENIGN 06/03/2008  . Coronary Artery Disease 06/03/2008    Past Medical History  Diagnosis Date  . CAD (coronary artery disease), native coronary artery     Multiple PCI procedures followed by CABG in 2009 with LIMA-LAD, SVG-D, seq SVG-OM and PLV, SVG-PDA. Echo (3/12): EF 60-65%, mild MR.   [name]. DM2 (diabetes mellitus, type 2)     Followed by Dr. Chestine Sporelark  . HLD (hyperlipidemia)   . HTN (hypertension)   . Postoperative atrial fibrillation 2009  . Carotid stenosis     dopplers 1/14:  0-39% bilat  . Colon polyps 2006    Colonoscopy-Dr. Patterson(Tubular Adenoma)   . Cataract     REMOVED  . Hx of cardiovascular stress test     ETT-Myoview (03/2013):  Anteroseptal reversible defect, EF 54%, intermediate risk.    Past Surgical History  Procedure Laterality Date  . Coronary artery bypass graft  2009    post op. a fib, LIMA -LAD, SVG-D,seq SVG-OM and PLV,  SVG-PDA.  . Cardiac catheterization  2009    EF 60%  . Back surgery  2006  . Appendectomy  2005  . Tonsillectomy    . Colonoscopy      History  Substance Use Topics  . Smoking status: Never Smoker   . Smokeless  tobacco: Former NeurosurgeonUser    Types: Chew    Quit date: 04/06/1993  . Alcohol Use: No    Family History  Problem Relation Age of Onset  . Coronary artery disease      family history  . Heart disease      family history  . Other Brother     CABG  . Other Brother     CABG  . Prostate cancer Father   . Diabetes Father   . Prostate cancer Brother   . Diabetes Brother     x 2  . Colon cancer Mother     Allergies  Allergen Reactions  . Penicillins Other (See Comments)    Unknown-Reaction as child    Medication list has been reviewed and updated.  Current Outpatient Prescriptions on File Prior to Visit  Medication Sig Dispense Refill  . amitriptyline (ELAVIL) 75 MG tablet Take 75 mg by mouth at bedtime.      [name]. aspirin 325 MG tablet Take 325 mg by mouth daily.      . clopidogrel (PLAVIX) 75 MG tablet Take 1 tablet (75 mg total) by mouth daily.  90 tablet  3  . ezetimibe (ZETIA) 10 MG tablet Take 1 tablet (10 mg total) by mouth daily.  90 tablet  3  . fenofibrate 54 MG tablet Take 1 tablet (54 mg total) by mouth daily.  30 tablet  3  . furosemide (LASIX) 40 MG tablet Take 1 tablet (40 mg total) by mouth daily.  90 tablet  3  . insulin lispro protamine-insulin lispro (HUMALOG 75/25) (75-25) 100 UNIT/ML SUSP Inject 40-60 Units into the skin 2 (two) times daily. Takes anywhere from 40-60 units as directed by sliding scale      . metFORMIN (GLUCOPHAGE) 1000 MG tablet Take 1,000 mg by mouth 2 (two) times daily with a meal.      . metoprolol tartrate (LOPRESSOR) 50 MG tablet Take 1 tablet (50 mg total) by mouth 2 (two) times daily.  180 tablet  3  . nitroGLYCERIN (NITROSTAT) 0.4 MG SL tablet Place 1 tablet (0.4 mg total) under the tongue every 5 (five) minutes as needed. For chest pain  25 tablet  3  . pravastatin (PRAVACHOL) 80 MG tablet Take 1 tablet (80 mg total) by mouth every evening.  90 tablet  3  . ramipril (ALTACE) 10 MG capsule Take 1 capsule (10 mg total) by mouth every morning.   90 capsule  3   No current facility-administered medications on file prior to visit.    Review of Systems:  As per HPI, otherwise negative.    Physical Examination: Filed Vitals:   08/06/13 0931  BP: 134/80  Pulse: 72  Temp: 98.2 F (36.8 C)  Resp: 14   Filed Vitals:   08/06/13 0931  Height: 5\' 7"  (1.702 m)  Weight: 242 lb (109.77 kg)   Body mass index is 37.89 kg/(m^2). Ideal Body Weight: Weight in (lb) to have BMI = 25: 159.3  GEN: WDWN, NAD, Non-toxic, A & O x 3 HEENT: Atraumatic, Normocephalic. Neck supple. No masses, No LAD. Ears and Nose: No external deformity. CV: RRR, No M/G/R. No JVD. No thrill. No extra heart sounds. PULM: CTA B, no wheezes, crackles, rhonchi. No retractions. No resp. distress. No accessory muscle use. ABD: S, NT, ND, +BS. No rebound. No HSM. EXTR: No c/c/e  Diabetic ulceration left plantar surface base of fifth toe.  Some drainage and contamination with fibers. NEURO Normal gait.  PSYCH: Normally interactive. Conversant. Not depressed or anxious appearing.  Calm demeanor.    Assessment and Plan: Wound debrided sharply and packed with dakins solution Urgent podiatry consultation cipro Monitor BS  Signed,  Phillips OdorJeffery Adonia Porada, MD

## 2013-08-06 NOTE — Patient Instructions (Signed)
Manage glucose Daily dressing change   Skin Ulcer A skin ulcer is an open sore that can be shallow or deep. Skin ulcers sometimes become infected and are difficult to treat. It may be 1 month or longer before real healing progress is made. CAUSES   Injury.  Problems with the veins or arteries.  Diabetes.  Insect bites.  Bedsores.  Inflammatory conditions. SYMPTOMS   Pain, redness, swelling, and tenderness around the ulcer.  Fever.  Bleeding from the ulcer.  Yellow or clear fluid coming from the ulcer. DIAGNOSIS  There are many types of skin ulcers. Any open sores will be examined. Certain tests will be done to determine the kind of ulcer you have. The right treatment depends on the type of ulcer you have. TREATMENT  Treatment is a long-term challenge. It may include:  Wearing an elastic wrap, compression stockings, or gel cast over the ulcer area.  Taking antibiotic medicines or putting antibiotic creams on the affected area if there is an infection. HOME CARE INSTRUCTIONS  Put on your bandages (dressings), wraps, or casts over the ulcer as directed by your caregiver.  Change all dressings as directed by your caregiver.  Take all medicines as directed by your caregiver.  Keep the affected area clean and dry.  Avoid injuries to the affected area.  Eat a well-balanced, healthy diet that includes plenty of fruit and vegetables.  If you smoke, consider quitting or decreasing the amount of cigarettes you smoke.  Once the ulcer heals, get regular exercise as directed by your caregiver.  Work with your caregiver to make sure your blood pressure, cholesterol, and diabetes are well-controlled.  Keep your skin moisturized. Dry skin can crack and lead to skin ulcers. SEEK IMMEDIATE MEDICAL CARE IF:   Your pain gets worse.  You have swelling, redness, or fluids around the ulcer.  You have chills.  You have a fever. MAKE SURE YOU:   Understand these  instructions.  Will watch your condition.  Will get help right away if you are not doing well or get worse. Document Released: 04/30/2004 Document Revised: 06/15/2011 Document Reviewed: 11/07/2010 [facility] Patient Information 2014 [employer] [place].

## 2013-08-09 ENCOUNTER — Ambulatory Visit (INDEPENDENT_AMBULATORY_CARE_PROVIDER_SITE_OTHER): Payer: BC Managed Care – PPO

## 2013-08-09 VITALS — BP 144/79 | HR 77 | Temp 97.5°F | Resp 18

## 2013-08-09 DIAGNOSIS — E1140 Type 2 diabetes mellitus with diabetic neuropathy, unspecified: Secondary | ICD-10-CM

## 2013-08-09 DIAGNOSIS — L03119 Cellulitis of unspecified part of limb: Secondary | ICD-10-CM

## 2013-08-09 DIAGNOSIS — L02619 Cutaneous abscess of unspecified foot: Secondary | ICD-10-CM

## 2013-08-09 DIAGNOSIS — M86179 Other acute osteomyelitis, unspecified ankle and foot: Secondary | ICD-10-CM

## 2013-08-09 DIAGNOSIS — E1149 Type 2 diabetes mellitus with other diabetic neurological complication: Secondary | ICD-10-CM

## 2013-08-09 DIAGNOSIS — L97509 Non-pressure chronic ulcer of other part of unspecified foot with unspecified severity: Secondary | ICD-10-CM

## 2013-08-09 DIAGNOSIS — E1142 Type 2 diabetes mellitus with diabetic polyneuropathy: Secondary | ICD-10-CM

## 2013-08-09 MED ORDER — SILVER SULFADIAZINE 1 % EX CREA: 1.0000 "application " | TOPICAL_CREAM | Freq: Every day | CUTANEOUS | Status: DC

## 2013-08-09 MED ORDER — CLINDAMYCIN HCL 150 MG PO CAPS: 150.0000 mg | ORAL_CAPSULE | Freq: Four times a day (QID) | ORAL | Status: DC

## 2013-08-09 NOTE — Progress Notes (Signed)
Subjective:    Patient ID: [name], male    DOB: 1950-05-21, 63 y.o.   MRN: [id number]  HPI I have a place on the bottom of my left foot and has been going on for about a week and a half and burns and throbs and numbness and drains some    Review of Systems  Neurological: Positive for numbness.  Hematological: Bruises/bleeds easily.  All other systems reviewed and are negative.      Objective:   Physical Exam 21-year-old white male presents at this time well-developed well-nourished oriented x3 with profound diabetic neuropathy affecting his foot has developed ulcer which she does not week or 2 it may actually longer os of fifth metatarsal area there is mild serous drainage discharge or drainage no significant malodor is significant maceration and blistering around the wound. No ascending cellulitis or lymphangitis is noted no fever chills.  Marcaine objective findings as follows vascular status is intact pedal pulses DP +2/4 PT one over 4 bilateral Refill time 3 seconds all digits skin temperature warm turgor normal no edema rubor pallor or varicosities noted. Neurologically epicritic and proprioceptive sensations grossly diminished bilateral on Semmes Weinstein testing and debridement has complete lack of sensation near the ulcer plantar left foot site. There is normal plantar response DTRs not elicited neurologically skin color pigment normal hair growth absent nails criptotic. There is an ulcer greater than a centimeter to a centimeter half in diameter full-thickness down to the capsular fifth MTP joint and she probe it goes down to the joint itself at this time a deep swab of the joint and ulcer is taken down to the joint capsule and fifth metatarsal head itself this was submitted patient is currently on Cipro antibiotic which is likely not finding any staph coverage we started on the medication in a day or 2 ago. At this time orthopedic biomechanical exam rectus foot type ankle  mid tarsus subtalar joint motion is normal x-rays taken at this time reveal that the mild proximal fifth metatarsal small accessory bone or left lateral projection reveals some lysis of the fifth metatarsal head on plantar aspects dorsal medial lateral aspects appear to be intact cannot rule out possibly early osteomyelitis of the fifth metatarsal head. As the wound is without the bone have to assume that there is contamination down to bone and acute osteomyelitis versus chronic osteomized cannot be excluded.       Assessment & Plan:  Assessment diabetes with profound neuropathy plantigrade metatarsals ulceration sub-fifth left and possible osteomyelitic change at this time the ulcer is debrided as after cultures taken Iodosorb and gauze dressing is applied patient does have a camera fracture boot at home which he is in a modified with pocketing for fifth metatarsal going to go back to work in his [employer]. Patient will do minimal standing and activities as possible in the interim also added prescriptions for Silvadene cream as well as clindamycin antibiotic for staph coverage. Take it locks instructed instructions for daily soaking in Betadine warm water and Silvadene and gauze dressing changes given shoe modification which we maintained at all times reappointed one week for followup device patient may be candidate for partial ray resection or fifth met head resection based on progress or improvement in the next week or 2. Extra patient is advised if any exacerbations occur fever chills or or increased cellulitis or swelling he is to go medially to the emergency room or contact us as soon as possible  Delfino Lovett  Blenda Mounts DPM

## 2013-08-09 NOTE — Patient Instructions (Signed)
Betadine Soak Instructions  Purchase an 8 oz. bottle of BETADINE solution (Povidone)  THE DAY AFTER THE PROCEDURE  Place 1 tablespoon of betadine solution in a quart of warm tap water.  Submerge your foot or feet with outer bandage intact for the initial soak; this will allow the bandage to become moist and wet for easy lift off.  Once you remove your bandage, continue to soak in the solution for 20 minutes.  This soak should be done twice a day.  Next, remove your foot or feet from solution, blot dry the affected area and cover.  You may use a band aid large enough to cover the area or use gauze and tape.  Apply other medications to the area as directed by the doctor such as cortisporin otic solution (ear drops) or neosporin.  IF YOUR SKIN BECOMES IRRITATED WHILE USING THESE INSTRUCTIONS, IT IS OKAY TO SWITCH TO EPSOM SALTS AND WATER OR WHITE VINEGAR AND WATER.  After soaking the foot dry well and apply Silvadene and gauze dressing daily.  Maintain air fracture Cam Walker boot at all times and offload the pressure from the fifth metatarsal area of left foot.

## 2013-08-09 NOTE — Addendum Note (Signed)
Addended by: Enedina FinnerMEADOWS, Livie Vanderhoof J on: 08/09/2013 04:43 PM   Modules accepted: Orders

## 2013-08-10 ENCOUNTER — Ambulatory Visit: Payer: BC Managed Care – PPO | Admitting: *Deleted

## 2013-08-10 DIAGNOSIS — L97509 Non-pressure chronic ulcer of other part of unspecified foot with unspecified severity: Secondary | ICD-10-CM

## 2013-08-10 NOTE — Progress Notes (Signed)
   Subjective:    Patient ID: [name], male    DOB: 07/08/1950, 63 y.o.   MRN: [id number]  HPI  DISPENSED AIR FRACTURE BOOT.  Review of Systems     Objective:   Physical Exam        Assessment & Plan:

## 2013-08-15 ENCOUNTER — Telehealth: Payer: Self-pay | Admitting: *Deleted

## 2013-08-15 NOTE — Telephone Encounter (Signed)
Yes 8 hours or driving could have an adverse effect on any foot especially when with a wound or infection or ulceration. Mainly take frequent breaks from driving continue with proper wound management at all times. Driving for prolonged periods of time control swelling in the feet which makes her shoes tight which in turn can aggravate a wound.  Alvan Dameichard Ava Tangney DPM

## 2013-08-15 NOTE — Telephone Encounter (Signed)
I called and informed the patient of Dr. Dionne BucySikora's response.  Eight hours of driving can have an adverse effect.  Will need to make frequent breaks from driving.  Can risk the chance of swelling which in turn can aggravate a wound.  He stated well sounds like I may need to stay home, that's all I needed to know.

## 2013-08-15 NOTE — Telephone Encounter (Signed)
I have a wound on my foot.  Thinking of taking a driving vacation.  It'll be about 8 hours of driving.  Will this affect my foot?

## 2013-08-16 ENCOUNTER — Other Ambulatory Visit: Payer: Self-pay

## 2013-08-17 NOTE — Telephone Encounter (Signed)
Dr. Ralene CorkSikora refilled Ciprofloxacin

## 2013-08-22 ENCOUNTER — Ambulatory Visit (INDEPENDENT_AMBULATORY_CARE_PROVIDER_SITE_OTHER): Payer: BC Managed Care – PPO

## 2013-08-22 ENCOUNTER — Other Ambulatory Visit (INDEPENDENT_AMBULATORY_CARE_PROVIDER_SITE_OTHER): Payer: BC Managed Care – PPO

## 2013-08-22 VITALS — BP 142/79 | HR 67 | Temp 98.5°F | Resp 18 | Ht 70.0 in | Wt 225.0 lb

## 2013-08-22 DIAGNOSIS — L97509 Non-pressure chronic ulcer of other part of unspecified foot with unspecified severity: Secondary | ICD-10-CM

## 2013-08-22 DIAGNOSIS — E785 Hyperlipidemia, unspecified: Secondary | ICD-10-CM

## 2013-08-22 DIAGNOSIS — L02619 Cutaneous abscess of unspecified foot: Secondary | ICD-10-CM

## 2013-08-22 DIAGNOSIS — E1149 Type 2 diabetes mellitus with other diabetic neurological complication: Secondary | ICD-10-CM

## 2013-08-22 DIAGNOSIS — I10 Essential (primary) hypertension: Secondary | ICD-10-CM

## 2013-08-22 DIAGNOSIS — L03119 Cellulitis of unspecified part of limb: Secondary | ICD-10-CM

## 2013-08-22 DIAGNOSIS — E1140 Type 2 diabetes mellitus with diabetic neuropathy, unspecified: Secondary | ICD-10-CM

## 2013-08-22 DIAGNOSIS — E1142 Type 2 diabetes mellitus with diabetic polyneuropathy: Secondary | ICD-10-CM

## 2013-08-22 DIAGNOSIS — I2510 Atherosclerotic heart disease of native coronary artery without angina pectoris: Secondary | ICD-10-CM

## 2013-08-22 LAB — HEPATIC FUNCTION PANEL
ALBUMIN: 4.1 g/dL (ref 3.5–5.2)
ALT: 27 U/L (ref 0–53)
AST: 35 U/L (ref 0–37)
Alkaline Phosphatase: 57 U/L (ref 39–117)
Bilirubin, Direct: 0.1 mg/dL (ref 0.0–0.3)
TOTAL PROTEIN: 6.9 g/dL (ref 6.0–8.3)
Total Bilirubin: 0.8 mg/dL (ref 0.2–1.2)

## 2013-08-22 LAB — LIPID PANEL
CHOLESTEROL: 129 mg/dL (ref 0–200)
HDL: 38.6 mg/dL — ABNORMAL LOW (ref >39.00)
LDL Cholesterol: 61 mg/dL (ref 0–99)
TRIGLYCERIDES: 145 mg/dL (ref 0.0–149.0)
Total CHOL/HDL Ratio: 3
VLDL: 29 mg/dL (ref 0.0–40.0)

## 2013-08-22 NOTE — Progress Notes (Signed)
Quick Note:  Preliminary report reviewed by triage nurse and sent to MD desk. ______ 

## 2013-08-22 NOTE — Patient Instructions (Addendum)
2Instructions for Wound Care  The most important step to healing a foot wound is to reduce the pressure on your foot - it is extremely important to stay off your foot as much as possible and wear the shoe/boot as instructed.  Cleanse your foot with saline wash or warm soapy water (dial antibacterial soap or similar).  Blot dry.  Apply prescribed medication to your wound and cover with gauze and a bandage.  May hold bandage in place with Coban (self sticky wrap), Ace bandage or tape.  You may find dressing supplies at your local [employer], [employer], drug store or medical supply store.  Your prescribed topical medication is :  Silvadene Cream (twice daily)    If you notice any foul odor, increase in pain, pus, increased swelling, red streaks or generalized redness occurring in your foot or leg-Call our office immediately to be seen.  This may be a sign of a limb or life threatening infection that will need prompt attention.  Alvan Dameichard Kristie Bracewell, [facility]DPM  [facility]  [phone] [place]  [phone] [facility]

## 2013-08-22 NOTE — Progress Notes (Signed)
   Subjective:    Patient ID: [name], male    DOB: 1950-06-04, 63 y.o.   MRN: [id number]  HPI Comments: Pt presents for follow up of left plantar 5th MPJ ulcer.  He's wearing the air fracture walker, and has the ulcer covered with a plastic bandaid, and the wound has drained on to the pt's white sock.     Review of Systems no new systemic changes or findings noted     Objective:   Physical Exam 56-year-old white male persists with his wife well-developed well-nourished white x3 patient continues to have no distress ulceration sub-fifth MTP area left foot is still present greater than a centimeter in diameter patient is doing Silvadene and switched over to Betadine arthritis of her as her dressing is in place her at been soaking Betadine currently the wrong instructions were for the patient for Betadine soaked versus soap and water or heat in daily for couple days which absent salts which is a good thing Betadine made over dry skin superficially. With thready skin is debrided with good pink from her healthy epithelial tissue noted there is central area the ulceration within the capsule of the joint is intact bilateral to the capsule with good on the bone or joint there is no purulent discharge drainage no malodor no ascending psoas lymphangitis localized erythema posse reduced from previous presentation. No other new changes neurovascular status intact the right foot is examined mild callus noted however intact in palpable pedal pulses on the right progress noted as well culture reports indicates multiple organisms present none predominate no staph aureus isolated no groove a strep was isolated however patient is been on clindamycin and Cipro advised to continue with has refills on both and will continue to do so recheck in 2 weeks for followup plan for followup x-ray to       Assessment & Plan:  Assessment ulcer sub-fifth MP. Down to the capsule deep subcutaneous capsular level keratosis  is debrided away Silvadene and gauze dressing is applied and will resume Silvadene rather than iodine for dressing changes daily also is wearing a cam boot with offloading and cocking for the fifth metatarsal recheck in 2 weeks for followup plan for followup x-ray at that time. Based on x-ray findings if there is any bony lysis or erosion may require met head resection however if the ulcer especially significant improvement may also require discuss the possible use of the fifth metatarsal head resection to facilitate healing reevaluate 2 weeks from now contact us in the interim if there's any exacerbations changes increasing fever chills erythema drainage or odor  Harriet Masson DPM

## 2013-08-24 ENCOUNTER — Ambulatory Visit (INDEPENDENT_AMBULATORY_CARE_PROVIDER_SITE_OTHER): Payer: BC Managed Care – PPO | Admitting: Cardiology

## 2013-08-24 ENCOUNTER — Encounter: Payer: Self-pay | Admitting: Cardiology

## 2013-08-24 VITALS — BP 133/66 | HR 64 | Ht 70.0 in | Wt 243.0 lb

## 2013-08-24 DIAGNOSIS — I10 Essential (primary) hypertension: Secondary | ICD-10-CM

## 2013-08-24 DIAGNOSIS — E785 Hyperlipidemia, unspecified: Secondary | ICD-10-CM

## 2013-08-24 DIAGNOSIS — I2510 Atherosclerotic heart disease of native coronary artery without angina pectoris: Secondary | ICD-10-CM

## 2013-08-24 NOTE — Patient Instructions (Signed)
Decrease aspirin to 81mg  daily.  Your physician wants you to follow-up in: 6 months with Dr Shirlee LatchMcLean. (November 2015).  You will receive a reminder letter in the mail two months in advance. If you don't receive a letter, please call our office to schedule the follow-up appointment.

## 2013-08-25 NOTE — Progress Notes (Signed)
Patient ID: Margaretmary Eddyobert W. Ontiveros, male   DOB: 02/20/1951, 63 y.o.   MRN: [id number] Primary Provider: Dr. Juanetta GoslingHawkins   63 yo with history of CAD s/p CABG in 2009 as well as HTN, DM, and hyperlipidemia presents for followup.  He had an ETT-Cardiolite for atypical symptoms in 12/14. This showed a reversible basal to mid anteroseptal defect.  He was really having no anginal symptoms at that point so we decided against LHC.  Since then, he has had no chest pain.  No exertional dyspnea.  He continues to work as a Secretary/administratorcabinet-maker.  He has a diabetic foot ulcer and is wearing a boot on his left foot.   ECG: NSR, nonspecific inferior T wave inversions   Labs (5/11): LDL 21, HDL 38, TGs 339, K 4.1, creatinine 1.2  Labs (12/14): K 4.3, creatinine 1.1 Labs (5/15): LDL 61, HDL 39, TGs 145  Allergies:  1) Amoxicillin (Amoxicillin)   Past Medical History:  1. CAD: Multiple PCI procedures followed by CABG in 2009 with LIMA-LAD, SVG-D, seq SVG-OM and PLV, SVG-PDA.  Echo (3/12): EF 60-65%, mild MR.  ETT-Cardiolite in 12/14 with a reversible basal to mid anteroseptal defect with EF 54%.  2. Insulin-dependent diabetes: Followed by Dr. Chestine Sporelark.  Has diabetic foot ulcer.  3. Hypertension.  4. Hyperlipidemia.  5. Postoperative atrial fibrillation after CABG in 2009  6. Carotid dopplers (1/14) with 0-39% bilateral ICA stenosis.   Family History:  Very strongly positive for coronary disease. 2 brothers with CABG. Multiple other family members had heart disease.   Social History:  He works at [employer] [place]. He is married and lives with his wife. No children. He has never smoked, but used to chew some tobacco. He denies alcohol abuse. Originally from [place].   ROS: All systems reviewed and negative except as per HPI.   Current Outpatient Prescriptions  Medication Sig Dispense Refill  . amitriptyline (ELAVIL) 75 MG tablet Take 75 mg by mouth at bedtime.      . ciprofloxacin  (CIPRO) 500 MG tablet TAKE 1 TABLET BY MOUTH 2 TIMES DAILY.  20 tablet  0  . clindamycin (CLEOCIN) 150 MG capsule Take 1 capsule (150 mg total) by mouth 4 (four) times daily.  40 capsule  1  . clopidogrel (PLAVIX) 75 MG tablet Take 1 tablet (75 mg total) by mouth daily.  90 tablet  3  . ezetimibe (ZETIA) 10 MG tablet Take 1 tablet (10 mg total) by mouth daily.  90 tablet  3  . fenofibrate 54 MG tablet Take 1 tablet (54 mg total) by mouth daily.  30 tablet  3  . furosemide (LASIX) 40 MG tablet Take 1 tablet (40 mg total) by mouth daily.  90 tablet  3  . metFORMIN (GLUCOPHAGE) 1000 MG tablet Take 1,000 mg by mouth 2 (two) times daily with a meal.      . metoprolol (LOPRESSOR) 50 MG tablet Take 100 mg by mouth 2 (two) times daily.      . nitroGLYCERIN (NITROSTAT) 0.4 MG SL tablet Place 1 tablet (0.4 mg total) under the tongue every 5 (five) minutes as needed. For chest pain  25 tablet  3  . NOVOLOG MIX 70/30 FLEXPEN (70-30) 100 UNIT/ML Pen Inject 40-60 Units into the skin 2 (two) times daily.       [name]. ofloxacin (OCUFLOX) 0.3 % ophthalmic solution       . pravastatin (PRAVACHOL) 80 MG tablet Take 1 tablet (80 mg  total) by mouth every evening.  90 tablet  3  . PROLENSA 0.07 % SOLN       . ramipril (ALTACE) 10 MG capsule Take 1 capsule (10 mg total) by mouth every morning.  90 capsule  3  . silver sulfADIAZINE (SILVADENE) 1 % cream Apply 1 application topically daily.  50 g  2  . aspirin EC 81 MG tablet Take 1 tablet (81 mg total) by mouth daily.       No current facility-administered medications for this visit.    BP 133/66  Pulse 64  Ht 5\' 10"  (1.778 m)  Wt 243 lb (110.224 kg)  BMI 34.87 kg/m2 General: NAD Neck: Thick, no JVD, no thyromegaly or thyroid nodule.  Lungs: Clear to auscultation bilaterally with normal respiratory effort. CV: Nondisplaced PMI.  Heart regular S1/S2, no S3/S4, no murmur.  No edema.  No carotid bruit.  Normal pedal pulses on right, boot on left.  Abdomen: Soft,  nontender, no hepatosplenomegaly, no distention.  Neurologic: Alert and oriented x 3.  Psych: Normal affect. Extremities: No clubbing or cyanosis.   Assessment/Plan: 1. CAD: Patient had an intermediate risk Cardiolite in 12/14 but has had no further anginal symptoms so we continue to manage medically.   He has been doing well lately.  Continue ASA, Plavix, statin, ramipril, and metoprolol.  Can decrease ASA to 81 mg daily.  2. Hyperlipidemia: LDL < 70 (at goal).  Continue current statin.  3. HTN: BP is well-controlled.   Laurey Morale 08/25/2013 9:44 AM

## 2013-08-29 ENCOUNTER — Telehealth: Payer: Self-pay | Admitting: *Deleted

## 2013-08-29 ENCOUNTER — Other Ambulatory Visit: Payer: Self-pay

## 2013-08-29 MED ORDER — CIPROFLOXACIN HCL 500 MG PO TABS: ORAL_TABLET | ORAL | Status: DC

## 2013-08-29 NOTE — Telephone Encounter (Signed)
I left the patient a message that we e-scribed the prescription to the pharmacy.  Also informed him that we have not received any refill request from his pharmacy.  It looks like the pharmacy may have sent it to Urgent Care.

## 2013-08-29 NOTE — Telephone Encounter (Signed)
I'm out of antibiotic, Ciprofloxacin-HCL 500 mg, taken twice a day.    [facility] has tried to call you and haven't gotten an answer from you.  I haven't had any today.  I ran out over the weekend.  Call [facility] so I can pick up this prescription.  Thank you, have a good day.

## 2013-08-31 ENCOUNTER — Telehealth: Payer: Self-pay | Admitting: *Deleted

## 2013-08-31 NOTE — Telephone Encounter (Signed)
He has a Diabetic wound on the bottom of his foot. Sore looked like it was doing well.  It was pink in color.  Last night to this morning there's a 1/8 inch area that has a yellow pus look.  It drains everyday through the gauze.  Pay me the courtesy of calling back or you can call my husband.  I know there's concerns about HIPAA.  She called again at 11:52am and spoke to Morris.  I advised Marylu Lund to schedule him an appointment with Dr. Ralene Cork for tomorrow.

## 2013-09-01 ENCOUNTER — Ambulatory Visit (INDEPENDENT_AMBULATORY_CARE_PROVIDER_SITE_OTHER): Payer: BC Managed Care – PPO

## 2013-09-01 VITALS — BP 110/67 | HR 71 | Temp 99.0°F | Resp 15 | Ht 69.0 in | Wt 228.0 lb

## 2013-09-01 DIAGNOSIS — L97509 Non-pressure chronic ulcer of other part of unspecified foot with unspecified severity: Secondary | ICD-10-CM

## 2013-09-01 DIAGNOSIS — E1142 Type 2 diabetes mellitus with diabetic polyneuropathy: Secondary | ICD-10-CM

## 2013-09-01 DIAGNOSIS — E1149 Type 2 diabetes mellitus with other diabetic neurological complication: Secondary | ICD-10-CM

## 2013-09-01 DIAGNOSIS — E1140 Type 2 diabetes mellitus with diabetic neuropathy, unspecified: Secondary | ICD-10-CM

## 2013-09-01 NOTE — Progress Notes (Signed)
   Subjective:    Patient ID: [name], male    DOB: [birth year], 63 y.o.   MRN: [id number]  HPI Comments: Pt states he saw another area of discharge at the Left 5th MPJ area.     Review of Systems no new systemic changes or findings     Objective:   Physical Exam Lower extremity neurovascular status is intact pedal pulses are palpable epicritic and proprioceptive sensations intact and symmetric bilateral there is no malodor the ulcer there is a slight hemorrhage a keratosis around the periphery there is a pleasant centimeter diameter the ulcer With a pink granular base no longer any drainage no deep capsular drainage or issue at this time seems to be improving although not completely resolved no secondary infections no ascending psoas lymphangitis noted       Assessment & Plan:  Assessment improving ulceration sub-fifth MTP area of the left foot at this time seems to be improving steadily a there was some concern previously about the odor posse as well as power seems to be to improve steady no discharge or drainage or minimal discharge or drainage no malodor at this time suggested possible future followup with alternative treatment utilizing epi-fix or skin substitute for closure or wound dressing we'll try to obtain authorization for use of epi-fix first followup visit with the next 2 weeks. In the interim continuous Silvadene gauze dressing was applied after debridement of ulcer down to subcutaneous tissue level  Alvan Dame DPM

## 2013-09-01 NOTE — Patient Instructions (Signed)
ANTIBACTERIAL SOAP INSTRUCTIONS  THE DAY AFTER PROCEDURE  Please follow the instructions your doctor has marked.   Shower as usual. Before getting out, place a drop of antibacterial liquid soap (Dial) on a wet, clean washcloth.  Gently wipe washcloth over affected area.  Afterward, rinse the area with warm water.  Blot the area dry with a soft cloth and cover with antibiotic ointment (neosporin, polysporin, bacitracin) and band aid or gauze and tape  Place 3-4 drops of antibacterial liquid soap in a quart of warm tap water.  Submerge foot into water for 20 minutes.  If bandage was applied after your procedure, leave on to allow for easy lift off, then remove and continue with soak for the remaining time.  Next, blot area dry with a soft cloth and cover with a bandage.  Apply other medications as directed by your doctor, such as cortisporin otic solution (eardrops) or neosporin antibiotic ointment  Wash with soap and water or Epsom salts and water daily to dry thoroughly and apply Silvadene and gauze dressing. Followup in 2 weeks as scheduled may consider a skin substitute application such as Epi-Fix  we'll obtain authorization in the interim.

## 2013-09-05 ENCOUNTER — Telehealth: Payer: Self-pay | Admitting: *Deleted

## 2013-09-05 NOTE — Telephone Encounter (Signed)
Prescription was sent to EpiFix, wound dressing, for prior Authorization per Dr. Ralene Cork  [facility] requested chart notes from last 3 visits, demographics and insurance.    [facility] Benefits and Prior Authorization Ph: [phone]

## 2013-09-06 ENCOUNTER — Ambulatory Visit: Payer: BC Managed Care – PPO

## 2013-09-07 ENCOUNTER — Telehealth: Payer: Self-pay | Admitting: *Deleted

## 2013-09-07 NOTE — Telephone Encounter (Addendum)
EpiFix prior authorization confirmed with a $40.00 co-pay per each visit for application until out-of-pocket is reached, this information is called to pt's home voicemail.  I requested pt call to confirm, he would like to begin treatment.

## 2013-09-08 ENCOUNTER — Other Ambulatory Visit: Payer: Self-pay

## 2013-09-11 ENCOUNTER — Telehealth: Payer: Self-pay | Admitting: *Deleted

## 2013-09-11 NOTE — Telephone Encounter (Signed)
After the at C6 is applied on Friday recommendation is to keep the area offloaded of any pressure for 5-7 days to allow to take better otherwise maintain the same accommodative shoe or boot as previously instructed. Basically he is just applying a dressing with the graft in place underneath the dressing the dressing will be left in place for 5-7 days following the visit  Alvan Dame DPM

## 2013-09-11 NOTE — Telephone Encounter (Signed)
I'd like to talk to nurse about upcoming procedure on Friday afternoon.  He's supposed to be putting a skin graft on my foot this Friday.  Will I have to be out of work?  He has me wearing a boot.  I told him I would have to ask Dr. Ralene Cork and get back to him.

## 2013-09-12 NOTE — Telephone Encounter (Signed)
I called and informed the patient that Dr. Ralene Cork said he will need to minimize walking and weight on graft site for at least 1 week for it to take.  He stated okay, I'll make arrangements.

## 2013-09-15 ENCOUNTER — Ambulatory Visit (INDEPENDENT_AMBULATORY_CARE_PROVIDER_SITE_OTHER): Payer: BC Managed Care – PPO

## 2013-09-15 VITALS — BP 137/73 | HR 69 | Temp 98.1°F | Resp 16 | Ht 69.0 in | Wt 226.0 lb

## 2013-09-15 DIAGNOSIS — E1140 Type 2 diabetes mellitus with diabetic neuropathy, unspecified: Secondary | ICD-10-CM

## 2013-09-15 DIAGNOSIS — E1142 Type 2 diabetes mellitus with diabetic polyneuropathy: Secondary | ICD-10-CM

## 2013-09-15 DIAGNOSIS — L97509 Non-pressure chronic ulcer of other part of unspecified foot with unspecified severity: Secondary | ICD-10-CM

## 2013-09-15 DIAGNOSIS — E1149 Type 2 diabetes mellitus with other diabetic neurological complication: Secondary | ICD-10-CM

## 2013-09-15 NOTE — Patient Instructions (Signed)
Epi-fix was applied at today's visit. Maintain a clean dry sterile dressing for the next 7 days. Contact us if there is any changes or symptoms were to develop such as fever chills increased swelling or severe drainage.  Followup in one week for reevaluation and possible additional application of bio healing agent  Maintain the air fracture boot and nonweightbearing as much as possible whenever sedentary keep the foot elevated to prevent swelling and edema

## 2013-09-15 NOTE — Progress Notes (Signed)
   Subjective:    Patient ID: [name], male    DOB: 01/23/1951, 63 y.o.   MRN: [id number]  HPI Comments: Pt states he has not seen any improvement at all in the left 5th MPJ.     Review of Systems no new findings or systemic changes noted     Objective:   Physical Exam Neurovascular status is intact pedal pulses palpable DP postal for PT one over 4. Are found absence of epicritic sensation plantar left foot. There is proxy 1.2 x 1.5 cm ulceration sub-fifth MTP area left foot down to the subcutaneous tissue level is a pink granular base with some surrounding macerated keratoses. Does not probe down to bone or capsule no undermining noted at this time. Mild serous drainage no ascending cellulitis lymphangitis no malodor noted current time should note clean stable ulcer at this time patient been doing wound dressing changes daily with Silvadene and gauze dressing as instructed prepared at this time to apply epi-fix advanced wound dressing biomaterial. There indications the wound is stable at this time the wound is debrided thoroughly cleansed with all cleansed the base of the wound is thoroughly debrided down to a slight bleed similar to preparation for a skin graft.       Assessment & Plan:  Assessment diabetic neuropathic ulcer plantar left foot several months in duration at this time we'll initiate epi-fix application. The epi-fix is applied within the wound parameters is then covered by a Mepitel dressing and a dry gauze secondary dressing to maintain the wound. This will be left in place for 7 day. Patient will use his air fracture boot and crutches to maintain nonweightbearing during this time. Reappointed one week for followup and reevaluation and possible additional applications as needed next  Alvan Dameichard Reshaun Briseno DPM

## 2013-09-20 ENCOUNTER — Encounter (HOSPITAL_BASED_OUTPATIENT_CLINIC_OR_DEPARTMENT_OTHER): Payer: BC Managed Care – PPO | Attending: General Surgery

## 2013-09-22 ENCOUNTER — Ambulatory Visit (INDEPENDENT_AMBULATORY_CARE_PROVIDER_SITE_OTHER): Payer: BC Managed Care – PPO

## 2013-09-22 VITALS — BP 140/69 | HR 62 | Resp 16

## 2013-09-22 DIAGNOSIS — E1142 Type 2 diabetes mellitus with diabetic polyneuropathy: Secondary | ICD-10-CM

## 2013-09-22 DIAGNOSIS — E1140 Type 2 diabetes mellitus with diabetic neuropathy, unspecified: Secondary | ICD-10-CM

## 2013-09-22 DIAGNOSIS — L97509 Non-pressure chronic ulcer of other part of unspecified foot with unspecified severity: Secondary | ICD-10-CM

## 2013-09-22 DIAGNOSIS — E1149 Type 2 diabetes mellitus with other diabetic neurological complication: Secondary | ICD-10-CM

## 2013-09-22 NOTE — Patient Instructions (Signed)
Maintain clean dry sterile dressing for the next 7 days. Limited walking activities as much as possible keep offloaded with pocket and boot. Next  If there's any drainage the dressing me change the outer dressing we've the most inner dressing intact and undisturbed  Contact us is any fever chills increasing pain or other changes to the foot

## 2013-09-22 NOTE — Progress Notes (Signed)
   Subjective:    Patient ID: [name], male    DOB: 09/27/1950, 63 y.o.   MRN: [id number]  HPI Comments: "  Follow up Sub 5th MPJ left foot - Post 1st application Epi-Fix (1 week)     Review of Systems no new systemic changes or findings noted at this time     Objective:   Physical Exam Neurovascular status is intact and unchanged pedal pulses palpable the ulcer was previously measured 1.2 x 1.5 cm sub-fifth MTP area left foot is examined at this time is greatly reduced in size to about 0.6-0.7 in overall diameter with very superficial dermal subdermal junction. There is minimal or no discharge or drainage at this current time no malodor no excessive hyper keratosis. Patient had epi-fix In place as instructed for 7 days and offload from work for 7 days as well. No other changes orthopedic biomechanical exam unchanged is ambulating in the air fracture boot with pocketing for the metatarsal. No other dermatologic changes or vascular or neurologic changes noted       Assessment & Plan:  Assessment diabetic neuropathic ulcer left foot improved significantly within 7 days utilizing epi-fix at this time epi-fix second application is carried out the wound is cleansed thoroughly with all cleansed and epi-fix and a Mepitel dressing followed by secondary gauze dressing is applied to maintain the wound environment. This will be left in place for 7 days after 7 days we'll remove the dressing and resume Silvadene and gauze dressings with cleansing with soap and water or Epsom salts and water. Patient will return in 2 weeks for followup and reevaluation and possible additional treatments if needed however based on improvement in the first week should have significant improvement not complete closure hopefully within 1 week patient will contact our office immediately to the problems changes or exacerbations in the intramedullary the office next week. Followup in 2 weeks as scheduled  Alvan Dameichard Deaja Rizo  DPM

## 2013-09-27 ENCOUNTER — Telehealth: Payer: Self-pay | Admitting: *Deleted

## 2013-09-27 NOTE — Telephone Encounter (Signed)
I need 2 antibiotics that Dr. Ralene CorkSikora has ordered.  I need them refilled.  Have you contacted them?  [facility] [address]

## 2013-09-27 NOTE — Telephone Encounter (Signed)
Check his last note and see which antibiotics he is referring to-- then you can call them in for him. thanks

## 2013-09-28 MED ORDER — CLINDAMYCIN HCL 150 MG PO CAPS: ORAL_CAPSULE | ORAL | Status: DC

## 2013-09-28 MED ORDER — CIPROFLOXACIN HCL 500 MG PO TABS: ORAL_TABLET | ORAL | Status: DC

## 2013-09-28 NOTE — Telephone Encounter (Signed)
I called and left the patient a message that Dr. Dimas AguasEgerton okayed the refills.  I am sending it over to the pharmacy now.  Ordering Clindamycin and Cipro, if that isn't correct give me a call.

## 2013-10-04 ENCOUNTER — Ambulatory Visit: Payer: BC Managed Care – PPO

## 2013-10-10 ENCOUNTER — Ambulatory Visit (INDEPENDENT_AMBULATORY_CARE_PROVIDER_SITE_OTHER): Payer: BC Managed Care – PPO

## 2013-10-10 VITALS — BP 125/63 | HR 62 | Resp 16

## 2013-10-10 DIAGNOSIS — E1149 Type 2 diabetes mellitus with other diabetic neurological complication: Secondary | ICD-10-CM

## 2013-10-10 DIAGNOSIS — E1140 Type 2 diabetes mellitus with diabetic neuropathy, unspecified: Secondary | ICD-10-CM

## 2013-10-10 DIAGNOSIS — L97509 Non-pressure chronic ulcer of other part of unspecified foot with unspecified severity: Secondary | ICD-10-CM

## 2013-10-10 NOTE — Progress Notes (Signed)
   Subjective:    Patient ID: [name], male    DOB: 02/02/1951, 63 y.o.   MRN: [id number]  HPI Comments: its terrible-rt     Review of Systems no new systemic changes or findings noted     Objective:   Physical Exam Lower extremity objective findings unchanged pedal pulses are palpable epicritic sensations diminished on Semmes Weinstein testing the ulcer is previously 1.2 x 1.5 cm is now completely epithelialized patient was concerned it looked worse there is an overlying eschar tissue and did epithelium which is cleansed and debridement way at this time revealing a good intact basement layer of skin with intact pink skin and the central portion of the ulcer completely consolidated no sign of discharge or drainage. However the skin still very thin and fragile patient advised to continue to offload is been wearing his boot with pocketing or will use shoes with pocketing for the fifth metatarsal in the future reappointed to 3 weeks for followup and reevaluation       Assessment & Plan:  Assessment this time is resolved are healing ulceration with no epithelialization with the use of epi-fix. The area is cleansed and Silvadene and gauze dressing applied maintain Silvadene or Neosporin and a light dressing once daily with soap and water as instructed however stress to continue to offload the area from pressure to allow the skin to thickened and tuft and over time. Recheck in 2-3 weeks for followup. Contact us visiting changes or exacerbations  Alvan Dameichard Winfrey Chillemi DPM

## 2013-10-10 NOTE — Patient Instructions (Signed)

## 2013-10-30 ENCOUNTER — Other Ambulatory Visit: Payer: Self-pay

## 2013-10-30 MED ORDER — METOPROLOL TARTRATE 50 MG PO TABS: 100.0000 mg | ORAL_TABLET | Freq: Two times a day (BID) | ORAL | Status: DC

## 2013-10-31 ENCOUNTER — Ambulatory Visit (INDEPENDENT_AMBULATORY_CARE_PROVIDER_SITE_OTHER): Payer: BC Managed Care – PPO

## 2013-10-31 VITALS — BP 152/88 | HR 82 | Temp 98.0°F | Resp 12

## 2013-10-31 DIAGNOSIS — L97509 Non-pressure chronic ulcer of other part of unspecified foot with unspecified severity: Secondary | ICD-10-CM

## 2013-10-31 DIAGNOSIS — L02619 Cutaneous abscess of unspecified foot: Secondary | ICD-10-CM

## 2013-10-31 DIAGNOSIS — L03119 Cellulitis of unspecified part of limb: Secondary | ICD-10-CM

## 2013-10-31 DIAGNOSIS — E1149 Type 2 diabetes mellitus with other diabetic neurological complication: Secondary | ICD-10-CM

## 2013-10-31 DIAGNOSIS — E1142 Type 2 diabetes mellitus with diabetic polyneuropathy: Secondary | ICD-10-CM

## 2013-10-31 DIAGNOSIS — E1140 Type 2 diabetes mellitus with diabetic neuropathy, unspecified: Secondary | ICD-10-CM

## 2013-10-31 MED ORDER — CLINDAMYCIN HCL 150 MG PO CAPS: ORAL_CAPSULE | ORAL | Status: DC

## 2013-10-31 NOTE — Addendum Note (Signed)
Addended by: Alphia Kava'CONNELL, VALERY D on: 10/31/2013 12:01 PM   Modules accepted: Orders

## 2013-10-31 NOTE — Patient Instructions (Addendum)
ANTIBACTERIAL SOAP INSTRUCTIONS  THE DAY AFTER PROCEDURE  Please follow the instructions your doctor has marked.   Shower as usual. Before getting out, place a drop of antibacterial liquid soap (Dial) on a wet, clean washcloth.  Gently wipe washcloth over affected area.  Afterward, rinse the area with warm water.  Blot the area dry with a soft cloth and cover with antibiotic ointment (neosporin, polysporin, bacitracin) and band aid or gauze and tape  Place 3-4 drops of antibacterial liquid soap in a quart of warm tap water.  Submerge foot into water for 20 minutes.  If bandage was applied after your procedure, leave on to allow for easy lift off, then remove and continue with soak for the remaining time.  Next, blot area dry with a soft cloth and cover with a bandage.  Apply other medications as directed by your doctor, such as cortisporin otic solution (eardrops) or neosporin antibiotic ointment  May also consider Epsom salts in warm water soaks as alternative to soap and water. After drying thoroughly apply Silvadene and gauze dressing as instructed

## 2013-10-31 NOTE — Progress Notes (Signed)
   Subjective:    Patient ID: Margaretmary Eddyobert W. Tallo, male    DOB: 11/26/1950, 63 y.o.   MRN: [id number]  HPI   ''LT FOOT IS A LITTLE SORE AND HAVE DRAINIGE.''   Review of Systems no systemic changes or new findings noted     Objective:   Physical Exam 63 year old white male presents this time for followup and recurrence of ulceration or blister. Patient had good epithelialization of the sub-fifth ulcer with closure however as he returned to regular shoes and removed the pocket Insall he developed a new breakdown in the blister started draining or bleeding this past Sunday appears to be a slight blister approximately a centimeter in diameter sub-fifth MTP area is down to dermal level does not go down to subcutaneous tissue no bone or capsule involvement some mild purulent discharge drainage is expressed from the site and cultured following debridement at this time we'll initiate you have a bike regimen clindamycin 150 4 times a day. Should note neurovascular status is intact and unchanged pedal pulses are palpable has profound absence of epicritic sensation Katie some paresthesias to the toes. No ascending cellulitis or lymphangitis noted no fever chills Spears be so superficial blister/infection and set back. Patient is wearing a pocket Insall today advised needs to maintain pocket insoles at all times likely for the rest of his life       Assessment & Plan:  Assessment recurrent are recalcitrant ulcer sub-fifth MTP area left foot/superficial blister down to dermal level culture and sensitivity are obtained and initiated clindamycin antibiotic regimen. Reappointed 2 weeks for followup stress to offload the area as much as possible the ulcer is debrided cleansed and Iodosorb and gauze dressing are applied at this time. Will maintain Silvadene dressings daily after cleansing or soaking as instructed  Alvan Dameichard Tityana Pagan DPM

## 2013-11-01 ENCOUNTER — Other Ambulatory Visit: Payer: Self-pay | Admitting: Cardiology

## 2013-11-03 ENCOUNTER — Other Ambulatory Visit: Payer: Self-pay

## 2013-11-03 ENCOUNTER — Telehealth: Payer: Self-pay | Admitting: *Deleted

## 2013-11-03 ENCOUNTER — Other Ambulatory Visit: Payer: Self-pay | Admitting: *Deleted

## 2013-11-03 LAB — WOUND CULTURE
Gram Stain: NONE SEEN
Gram Stain: NONE SEEN

## 2013-11-03 MED ORDER — CIPROFLOXACIN HCL 500 MG PO TABS: ORAL_TABLET | ORAL | Status: DC

## 2013-11-03 NOTE — Progress Notes (Signed)
Culture removal results were reviewed indicating a staph with coagulase-negative. Resistance to clindamycin. Therefore recommendation at this time is to add Cipro to the patient's treatment regimen. [facility] and patient is contacted to pick up his prescription and take Cipro twice daily for 10 days as instructed as well as the clindamycin. Also advised to keep his followup appointment with the next 2 weeks. Contact us if there's any exacerbations or worsening of symptoms at any time.  Alvan Dameichard Aleeyah Bensen DPM

## 2013-11-03 NOTE — Telephone Encounter (Signed)
Per Dr. Ralene CorkSikora - Patient's culture results were reviewed and showed staph infection with resistance to clindamycin. Called patient L/M and advised him to go ahead and complete clindamycin, but also Dr. Ralene CorkSikora called in [facility]ipro [facility] BID [facility] [address] and start taking this antibiotic as well.

## 2013-11-13 ENCOUNTER — Encounter (HOSPITAL_BASED_OUTPATIENT_CLINIC_OR_DEPARTMENT_OTHER): Payer: BC Managed Care – PPO | Attending: Plastic Surgery

## 2013-11-13 DIAGNOSIS — Z9109 Other allergy status, other than to drugs and biological substances: Secondary | ICD-10-CM | POA: Diagnosis not present

## 2013-11-13 DIAGNOSIS — Z7902 Long term (current) use of antithrombotics/antiplatelets: Secondary | ICD-10-CM | POA: Insufficient documentation

## 2013-11-13 DIAGNOSIS — Z7982 Long term (current) use of aspirin: Secondary | ICD-10-CM | POA: Insufficient documentation

## 2013-11-13 DIAGNOSIS — E1169 Type 2 diabetes mellitus with other specified complication: Secondary | ICD-10-CM | POA: Insufficient documentation

## 2013-11-13 DIAGNOSIS — I10 Essential (primary) hypertension: Secondary | ICD-10-CM | POA: Insufficient documentation

## 2013-11-13 DIAGNOSIS — B957 Other staphylococcus as the cause of diseases classified elsewhere: Secondary | ICD-10-CM | POA: Diagnosis not present

## 2013-11-13 DIAGNOSIS — L97509 Non-pressure chronic ulcer of other part of unspecified foot with unspecified severity: Secondary | ICD-10-CM | POA: Insufficient documentation

## 2013-11-13 DIAGNOSIS — Z794 Long term (current) use of insulin: Secondary | ICD-10-CM | POA: Diagnosis not present

## 2013-11-13 DIAGNOSIS — I2510 Atherosclerotic heart disease of native coronary artery without angina pectoris: Secondary | ICD-10-CM | POA: Insufficient documentation

## 2013-11-13 DIAGNOSIS — G589 Mononeuropathy, unspecified: Secondary | ICD-10-CM | POA: Insufficient documentation

## 2013-11-13 DIAGNOSIS — E785 Hyperlipidemia, unspecified: Secondary | ICD-10-CM | POA: Insufficient documentation

## 2013-11-13 DIAGNOSIS — Z9089 Acquired absence of other organs: Secondary | ICD-10-CM | POA: Diagnosis not present

## 2013-11-13 DIAGNOSIS — Z951 Presence of aortocoronary bypass graft: Secondary | ICD-10-CM | POA: Diagnosis not present

## 2013-11-13 NOTE — Consult Note (Signed)
NAMLasandra [name]:  [name], [name]              ACCOUNT NO.:  [id number]  MEDICAL RECORD NO.:  [id number]  LOCATION:  FOOT                         FACILITY:  [facility]  PHYSICIAN:  Glenna FellowsBrinda Chukwuemeka Artola, MD   DATE OF BIRTH:  April 30, 1950  DATE OF CONSULTATION:  11/13/2013 DATE OF DISCHARGE:                                CONSULTATION   CHIEF COMPLAINT:  Left plantar foot ulceration in the setting of diabetes mellitus.  HISTORY OF PRESENT ILLNESS:  The patient is a 63 year old diabetic male with history of left plantar foot ulceration since August 02, 2013.  He has been under the care of Dr. Ralene CorkSikora from Podiatry.  He has undergone 2 applications of EpiFix in the past and he has had a walking boot.  He has also had the wound treated with Silvadene.  He reports that he had nearly healed the wound and returned to ambulation and the wound opened up again.  The patient is self referred and would like to transfer his care here.  He has had foot ulcerations in the remote past for which he was [facility].  He had culture done 1 week ago which grew staphylococcus species. He was prescribed clindamycin, the staphylococcus is resistant to this.  He was instructed to finish off the clindamycin, and he was given additional prescription for Cipro. The patient reports verbally his last hemoglobin C was 8.9, we do not have any records of this.  His last recorded hemoglobin A1c in the chart was 10.2 in December 2014.  There are other no additional labs in the chart.  PAST MEDICAL HISTORY: 1. Coronary artery disease. 2. Diabetes mellitus. 3. Hyperlipidemia. 4. Hypertension. 5. Carotid stenosis. 6. Cataracts.  PAST SURGICAL HISTORY:  Coronary artery bypass graft, back surgery, appendectomy, tonsillectomy, and colonoscopy.  SOCIAL HISTORY:  The patient was never a smoker.  STUDIES:  Normal x-ray with no evidence of osteomyelitis from October 19, 2013.  MEDICATIONS:  Aspirin, Plavix, Cipro, clindamycin,  Elavil, Zetia, fenofibrate, Lasix, insulin, Glucophage, Lopressor, Nitrostat, Ocuflox, Pravachol and Altace.  PHYSICAL EXAMINATION:  Blood pressure is 145/76, pulse is 78, temperature is 98.3, height is 5 feet 10 inches, weight is 230 pounds, blood glucose is 158.  ABI calculated in the office on left is 1.12. There are no palpable pulses present.  He has absent sensation on all areas tested by Semmes-Weinstein testing.  There is a left plantar ulcer.  There is a Wagner 2 ulceration over the fifth metatarsal head measured at 0.8 x 0.8 x 0.1 cm.  The wound is clean with no slough present.  PLAN:  We will plan to institute collagen treatment followed by contact casting with [employer].  He will followup in 2 days' time for cast check and follow up with myself in 1 week's time.  We will refer him to  Vascular [facility] specialist for screening ABI and toe brachial index. Instructed the patient to call the clinic here to have the cast removed prior to any vascular surgery appointment.  We will obtain new laboratory.  Given that his wound has been under local wound care for over 3 months' time with no resolution, I would like to proceed with  advanced wound care products.  We will apply for application of [employer].  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  In this setting again I counseled him that it is best that he uses the contact casting.          ______________________________ Glenna Fellows, MD MBA     BT/MEDQ  D:  11/13/2013  T:  11/13/2013  Job:  [id number]

## 2013-11-14 ENCOUNTER — Ambulatory Visit: Payer: BC Managed Care – PPO

## 2013-11-15 DIAGNOSIS — I10 Essential (primary) hypertension: Secondary | ICD-10-CM | POA: Diagnosis not present

## 2013-11-15 DIAGNOSIS — E785 Hyperlipidemia, unspecified: Secondary | ICD-10-CM | POA: Diagnosis not present

## 2013-11-15 DIAGNOSIS — E1169 Type 2 diabetes mellitus with other specified complication: Secondary | ICD-10-CM | POA: Diagnosis not present

## 2013-11-15 DIAGNOSIS — L97509 Non-pressure chronic ulcer of other part of unspecified foot with unspecified severity: Secondary | ICD-10-CM | POA: Diagnosis not present

## 2013-11-17 ENCOUNTER — Other Ambulatory Visit: Payer: Self-pay | Admitting: *Deleted

## 2013-11-17 DIAGNOSIS — L97909 Non-pressure chronic ulcer of unspecified part of unspecified lower leg with unspecified severity: Secondary | ICD-10-CM

## 2013-11-20 DIAGNOSIS — L97509 Non-pressure chronic ulcer of other part of unspecified foot with unspecified severity: Secondary | ICD-10-CM | POA: Diagnosis not present

## 2013-11-20 DIAGNOSIS — E1169 Type 2 diabetes mellitus with other specified complication: Secondary | ICD-10-CM | POA: Diagnosis not present

## 2013-11-20 DIAGNOSIS — I10 Essential (primary) hypertension: Secondary | ICD-10-CM | POA: Diagnosis not present

## 2013-11-20 DIAGNOSIS — E785 Hyperlipidemia, unspecified: Secondary | ICD-10-CM | POA: Diagnosis not present

## 2013-11-27 DIAGNOSIS — I10 Essential (primary) hypertension: Secondary | ICD-10-CM | POA: Diagnosis not present

## 2013-11-27 DIAGNOSIS — E785 Hyperlipidemia, unspecified: Secondary | ICD-10-CM | POA: Diagnosis not present

## 2013-11-27 DIAGNOSIS — L97509 Non-pressure chronic ulcer of other part of unspecified foot with unspecified severity: Secondary | ICD-10-CM | POA: Diagnosis not present

## 2013-11-27 DIAGNOSIS — E1169 Type 2 diabetes mellitus with other specified complication: Secondary | ICD-10-CM | POA: Diagnosis not present

## 2013-12-04 DIAGNOSIS — I10 Essential (primary) hypertension: Secondary | ICD-10-CM | POA: Diagnosis not present

## 2013-12-04 DIAGNOSIS — E1169 Type 2 diabetes mellitus with other specified complication: Secondary | ICD-10-CM | POA: Diagnosis not present

## 2013-12-04 DIAGNOSIS — E785 Hyperlipidemia, unspecified: Secondary | ICD-10-CM | POA: Diagnosis not present

## 2013-12-04 DIAGNOSIS — L97509 Non-pressure chronic ulcer of other part of unspecified foot with unspecified severity: Secondary | ICD-10-CM | POA: Diagnosis not present

## 2013-12-12 ENCOUNTER — Encounter: Payer: Self-pay | Admitting: Vascular Surgery

## 2013-12-13 ENCOUNTER — Ambulatory Visit (INDEPENDENT_AMBULATORY_CARE_PROVIDER_SITE_OTHER)
Admission: RE | Admit: 2013-12-13 | Discharge: 2013-12-13 | Disposition: A | Discharge status: Home / Self Care | Payer: BC Managed Care – PPO | Source: Ambulatory Visit

## 2013-12-13 ENCOUNTER — Encounter (HOSPITAL_BASED_OUTPATIENT_CLINIC_OR_DEPARTMENT_OTHER): Payer: BC Managed Care – PPO | Attending: General Surgery

## 2013-12-13 ENCOUNTER — Encounter: Payer: Self-pay | Admitting: Vascular Surgery

## 2013-12-13 ENCOUNTER — Ambulatory Visit (HOSPITAL_COMMUNITY)
Admission: RE | Admit: 2013-12-13 | Discharge: 2013-12-13 | Disposition: A | Discharge status: Home / Self Care | Payer: BC Managed Care – PPO | Source: Ambulatory Visit | Attending: Vascular Surgery | Admitting: Vascular Surgery

## 2013-12-13 ENCOUNTER — Other Ambulatory Visit (HOSPITAL_COMMUNITY): Payer: BC Managed Care – PPO

## 2013-12-13 ENCOUNTER — Ambulatory Visit (INDEPENDENT_AMBULATORY_CARE_PROVIDER_SITE_OTHER): Payer: BC Managed Care – PPO | Admitting: Vascular Surgery

## 2013-12-13 VITALS — BP 133/70 | HR 85 | Resp 16 | Ht 69.0 in | Wt 230.0 lb

## 2013-12-13 DIAGNOSIS — E119 Type 2 diabetes mellitus without complications: Secondary | ICD-10-CM | POA: Diagnosis not present

## 2013-12-13 DIAGNOSIS — E785 Hyperlipidemia, unspecified: Secondary | ICD-10-CM | POA: Insufficient documentation

## 2013-12-13 DIAGNOSIS — L97509 Non-pressure chronic ulcer of other part of unspecified foot with unspecified severity: Secondary | ICD-10-CM | POA: Insufficient documentation

## 2013-12-13 DIAGNOSIS — I83009 Varicose veins of unspecified lower extremity with ulcer of unspecified site: Secondary | ICD-10-CM | POA: Diagnosis not present

## 2013-12-13 DIAGNOSIS — I10 Essential (primary) hypertension: Secondary | ICD-10-CM | POA: Diagnosis not present

## 2013-12-13 DIAGNOSIS — I872 Venous insufficiency (chronic) (peripheral): Secondary | ICD-10-CM | POA: Insufficient documentation

## 2013-12-13 DIAGNOSIS — I83893 Varicose veins of bilateral lower extremities with other complications: Secondary | ICD-10-CM | POA: Insufficient documentation

## 2013-12-13 DIAGNOSIS — L97909 Non-pressure chronic ulcer of unspecified part of unspecified lower leg with unspecified severity: Secondary | ICD-10-CM | POA: Insufficient documentation

## 2013-12-13 DIAGNOSIS — I739 Peripheral vascular disease, unspecified: Secondary | ICD-10-CM | POA: Diagnosis not present

## 2013-12-13 DIAGNOSIS — E1169 Type 2 diabetes mellitus with other specified complication: Secondary | ICD-10-CM | POA: Insufficient documentation

## 2013-12-13 HISTORY — DX: Varicose veins of bilateral lower extremities with other complications: I83.893

## 2013-12-13 NOTE — Progress Notes (Signed)
Patient ID: [name], male   DOB: [birth year], 63 y.o.   MRN: [id number]  Reason for Consult: Left foot wound.   Referred by Dr. Glenna Fellows  Subjective:     HPI:  Ralston Venus is a 63 y.o. male Who was referred by the wound care center with a wound on the lateral aspect of his left foot. He states that he developed this in May. He has been followed in the wound care center and I have reviewed those notes. The wound has also been treated by Dr. Ralene Cork. He has had a walking boot in order to offload the wound. He denies any history of fever or chills. He denies any significant drainage from the wound. He apparently had a culture however at some point that grew staph and he has been on Cipro.  He denies any history of claudication or rest pain.  Past Medical History  Diagnosis Date  . CAD (coronary artery disease), native coronary artery     Multiple PCI procedures followed by CABG in 2009 with LIMA-LAD, SVG-D, seq SVG-OM and PLV, SVG-PDA. Echo (3/12): EF 60-65%, mild MR.   [name] DM2 (diabetes mellitus, type 2)     Followed by Dr. Chestine Spore  . HLD (hyperlipidemia)   . HTN (hypertension)   . Postoperative atrial fibrillation 2009  . Carotid stenosis     dopplers 1/14:  0-39% bilat  . Colon polyps 2006    Colonoscopy-Dr. Patterson(Tubular Adenoma)   . Cataract     REMOVED  . Hx of cardiovascular stress test     ETT-Myoview (03/2013):  Anteroseptal reversible defect, EF 54%, intermediate risk.  [name] Ulcer     left foot   Family History  Problem Relation Age of Onset  . Coronary artery disease      family history  . Heart disease      family history  . Other Brother     CABG  . Other Brother     CABG  . Prostate cancer Father   . Diabetes Father   . Prostate cancer Brother   . Diabetes Brother     x 2  . Colon cancer Mother    Past Surgical History  Procedure Laterality Date  . Coronary artery bypass graft  2009    post op. a fib, LIMA -LAD, SVG-D,seq SVG-OM  and PLV,  SVG-PDA.  . Cardiac catheterization  2009    EF 60%  . Back surgery  2006  . Appendectomy  2005  . Tonsillectomy    . Colonoscopy     Short Social History:  History  Substance Use Topics  . Smoking status: Never Smoker   . Smokeless tobacco: Former Neurosurgeon    Types: Chew    Quit date: 04/06/1993  . Alcohol Use: No   Allergies  Allergen Reactions  . Penicillins Other (See Comments)    Unknown-Reaction as child   Current Outpatient Prescriptions  Medication Sig Dispense Refill  . amitriptyline (ELAVIL) 75 MG tablet Take 75 mg by mouth at bedtime.      [name] aspirin EC 81 MG tablet Take 1 tablet (81 mg total) by mouth daily.      . ciprofloxacin (CIPRO) 500 MG tablet TAKE 1 TABLET BY MOUTH 2 TIMES DAILY.  20 tablet  0  . clindamycin (CLEOCIN) 150 MG capsule TAKE 1 CAPSULE (150 MG TOTAL) BY MOUTH 4 (FOUR) TIMES DAILY.  40 capsule  0  . clopidogrel (PLAVIX) 75 MG tablet Take 1 tablet (  75 mg total) by mouth daily.  90 tablet  3  . ezetimibe (ZETIA) 10 MG tablet Take 1 tablet (10 mg total) by mouth daily.  90 tablet  3  . fenofibrate 54 MG tablet TAKE 1 TABLET (54 MG TOTAL) BY MOUTH DAILY.  30 tablet  2  . furosemide (LASIX) 40 MG tablet Take 1 tablet (40 mg total) by mouth daily.  90 tablet  3  . metFORMIN (GLUCOPHAGE) 1000 MG tablet Take 1,000 mg by mouth 2 (two) times daily with a meal.      . metoprolol (LOPRESSOR) 50 MG tablet Take 2 tablets (100 mg total) by mouth 2 (two) times daily.  120 tablet  6  . nitroGLYCERIN (NITROSTAT) 0.4 MG SL tablet Place 1 tablet (0.4 mg total) under the tongue every 5 (five) minutes as needed. For chest pain  25 tablet  3  . NOVOFINE 30G X 8 MM MISC       . NOVOLOG MIX 70/30 FLEXPEN (70-30) 100 UNIT/ML Pen Inject 40-60 Units into the skin 2 (two) times daily.       . pravastatin (PRAVACHOL) 80 MG tablet Take 1 tablet (80 mg total) by mouth every evening.  90 tablet  3  . ramipril (ALTACE) 10 MG capsule Take 1 capsule (10 mg total) by mouth every  morning.  90 capsule  3  . ofloxacin (OCUFLOX) 0.3 % ophthalmic solution       . PROLENSA 0.07 % SOLN       . silver sulfADIAZINE (SILVADENE) 1 % cream Apply 1 application topically daily.  50 g  2   No current facility-administered medications for this visit.   Review of Systems  Constitutional: Negative for chills and fever.  Eyes: Negative for loss of vision.  Respiratory: Negative for cough and wheezing.  Cardiovascular: Positive for leg swelling. Negative for chest pain, chest tightness, claudication, dyspnea with exertion, orthopnea and palpitations.  GI: Negative for blood in stool and vomiting.  GU: Negative for dysuria and hematuria.  Musculoskeletal: Negative for leg pain, joint pain and myalgias.  Skin: Negative for rash and wound.  Neurological: Negative for dizziness and speech difficulty.  Hematologic: Negative for bruises/bleeds easily. Psychiatric: Negative for depressed mood.       Objective:  Objective  Filed Vitals:   12/13/13 1254  BP: 133/70  Pulse: 85  Resp: 16  Height:  (1.753 m)  Weight: 230 lb (104.327 kg)   Body mass index is 33.95 kg/(m^2).  Physical Exam  Constitutional: He is oriented to person, place, and time. He appears well-developed and well-nourished.  HENT:  Head: Normocephalic and atraumatic.  Neck: Neck supple. No JVD present. No thyromegaly present.  Cardiovascular: Normal rate, regular rhythm and normal heart sounds.  Exam reveals no friction rub.   No murmur heard. Pulses:      Femoral pulses are 2+ on the right side, and 2+ on the left side.      Popliteal pulses are 2+ on the right side, and 2+ on the left side.       Dorsalis pedis pulses are 0 on the right side, and 0 on the left side.       Posterior tibial pulses are 0 on the right side, and 0 on the left side.  I do not detect carotid bruits.  Pulmonary/Chest: Breath sounds normal. He has no wheezes. He has no rales.  Abdominal: Soft. Bowel sounds are normal. There  is no tenderness.  Musculoskeletal: Normal range  of motion. He exhibits no edema.  Lymphadenopathy:    He has no cervical adenopathy.  Neurological: He is alert and oriented to person, place, and time. He has normal strength. No sensory deficit.  Skin: No lesion and no rash noted.  He has a full thickness wound over the fifth metatarsal head. Currently there is no significant drainage. There is some surrounding erythema.  Psychiatric: He has a normal mood and affect.   Data: ARTERIAL DOPPLER STUDY: I have independently interpreted his arterial Doppler study. He has triphasic Doppler signals in the dorsalis pedis and posterior tibial positions bilaterally. Toe pressure on the right is 62 mm of mercury. Toe pressure on the left is 74 mmHg.  I have independently interpreted his venous duplex scan. On the left side, which is the symptomatic side, there is no evidence of DVT. He does have reflux in the deep system on the left involving the common femoral vein, superficial femoral vein, popliteal vein, and posterior tibial veins. There is no reflux in the greater saphenous vein on the left.  On the right side, there is no evidence of DVT. He's does have deep vein reflux in the common femoral vein and superficial femoral vein. He also has significant reflux in the right greater saphenous vein.      Assessment/Plan:    NONHEALING WOUND OF THE LEFT FOOT: This patient is had a nonhealing wound of the left foot over the fifth metatarsal head. I reassured him that his Doppler studies today showed no evidence of arterial insufficiency which would diminish his chance of healing this for a vascular standpoint. His toe pressure on the symptomatic side is 74 mmHg which would suggest adequate circulation for healing this wound. Likewise, if he required surgery on the left foot he should have adequate circulation to heal any surgery that is done. He does have some deep vein reflux on left leg but I do not think that  this would be contributing to this wound given its location. He has no superficial venous reflux on the left. Certainly if the wound progressed we could consider arteriography, however based on the information I have it appears that he has adequate circulation to heal this wound. We will see him back as needed.    Chuck Hint MD [facility]

## 2013-12-20 DIAGNOSIS — E1169 Type 2 diabetes mellitus with other specified complication: Secondary | ICD-10-CM | POA: Diagnosis not present

## 2013-12-20 DIAGNOSIS — L97509 Non-pressure chronic ulcer of other part of unspecified foot with unspecified severity: Secondary | ICD-10-CM | POA: Diagnosis not present

## 2013-12-25 DIAGNOSIS — L97509 Non-pressure chronic ulcer of other part of unspecified foot with unspecified severity: Secondary | ICD-10-CM | POA: Diagnosis not present

## 2013-12-25 DIAGNOSIS — E1169 Type 2 diabetes mellitus with other specified complication: Secondary | ICD-10-CM | POA: Diagnosis not present

## 2014-01-01 ENCOUNTER — Other Ambulatory Visit (HOSPITAL_COMMUNITY): Payer: Self-pay | Admitting: Pulmonary Disease

## 2014-01-01 ENCOUNTER — Ambulatory Visit (HOSPITAL_COMMUNITY)
Admission: RE | Admit: 2014-01-01 | Discharge: 2014-01-01 | Disposition: A | Discharge status: Home / Self Care | Payer: BC Managed Care – PPO | Source: Ambulatory Visit | Attending: Pulmonary Disease | Admitting: Pulmonary Disease

## 2014-01-01 DIAGNOSIS — M25619 Stiffness of unspecified shoulder, not elsewhere classified: Secondary | ICD-10-CM | POA: Insufficient documentation

## 2014-01-01 DIAGNOSIS — L97509 Non-pressure chronic ulcer of other part of unspecified foot with unspecified severity: Secondary | ICD-10-CM | POA: Diagnosis not present

## 2014-01-01 DIAGNOSIS — E1169 Type 2 diabetes mellitus with other specified complication: Secondary | ICD-10-CM | POA: Diagnosis not present

## 2014-01-01 DIAGNOSIS — M25519 Pain in unspecified shoulder: Secondary | ICD-10-CM | POA: Diagnosis not present

## 2014-01-01 DIAGNOSIS — M25511 Pain in right shoulder: Secondary | ICD-10-CM

## 2014-01-08 ENCOUNTER — Encounter (HOSPITAL_BASED_OUTPATIENT_CLINIC_OR_DEPARTMENT_OTHER): Payer: BC Managed Care – PPO | Attending: Plastic Surgery

## 2014-01-08 DIAGNOSIS — L97429 Non-pressure chronic ulcer of left heel and midfoot with unspecified severity: Secondary | ICD-10-CM | POA: Diagnosis present

## 2014-01-15 DIAGNOSIS — L97429 Non-pressure chronic ulcer of left heel and midfoot with unspecified severity: Secondary | ICD-10-CM | POA: Diagnosis not present

## 2014-01-17 ENCOUNTER — Other Ambulatory Visit: Payer: Self-pay | Admitting: Physician Assistant

## 2014-01-22 DIAGNOSIS — L97429 Non-pressure chronic ulcer of left heel and midfoot with unspecified severity: Secondary | ICD-10-CM | POA: Diagnosis not present

## 2014-02-06 ENCOUNTER — Other Ambulatory Visit: Payer: Self-pay | Admitting: Cardiology

## 2014-02-19 ENCOUNTER — Other Ambulatory Visit: Payer: Self-pay | Admitting: Physician Assistant

## 2014-03-04 ENCOUNTER — Other Ambulatory Visit: Payer: Self-pay | Admitting: Cardiology

## 2014-03-14 ENCOUNTER — Ambulatory Visit (INDEPENDENT_AMBULATORY_CARE_PROVIDER_SITE_OTHER): Payer: BC Managed Care – PPO | Admitting: Cardiology

## 2014-03-14 ENCOUNTER — Encounter: Payer: Self-pay | Admitting: Cardiology

## 2014-03-14 VITALS — BP 132/72 | HR 70 | Ht 69.0 in | Wt 233.0 lb

## 2014-03-14 DIAGNOSIS — E785 Hyperlipidemia, unspecified: Secondary | ICD-10-CM

## 2014-03-14 DIAGNOSIS — I2510 Atherosclerotic heart disease of native coronary artery without angina pectoris: Secondary | ICD-10-CM

## 2014-03-14 DIAGNOSIS — I25810 Atherosclerosis of coronary artery bypass graft(s) without angina pectoris: Secondary | ICD-10-CM

## 2014-03-14 DIAGNOSIS — I10 Essential (primary) hypertension: Secondary | ICD-10-CM

## 2014-03-14 LAB — BASIC METABOLIC PANEL
BUN: 23 mg/dL (ref 6–23)
CO2: 25 meq/L (ref 19–32)
Calcium: 9.5 mg/dL (ref 8.4–10.5)
Chloride: 98 mEq/L (ref 96–112)
Creatinine, Ser: 1.5 mg/dL (ref 0.4–1.5)
GFR: 52.57 mL/min — ABNORMAL LOW (ref >60.00)
Glucose, Bld: 223 mg/dL — ABNORMAL HIGH (ref 70–99)
Potassium: 3.8 mEq/L (ref 3.5–5.1)
SODIUM: 133 meq/L — AB (ref 135–145)

## 2014-03-14 LAB — BRAIN NATRIURETIC PEPTIDE: Pro B Natriuretic peptide (BNP): 22 pg/mL (ref 0.0–100.0)

## 2014-03-14 NOTE — Patient Instructions (Signed)
Take coenzyme Q10 200mg  daily. You can get this without a prescription.  Your physician recommends that you have  lab work today--BMET/BNP.  Your physician recommends that you return for a FASTING lipid profile June 2016  Your physician wants you to follow-up in: 1 year with Dr Shirlee LatchMcLean. (December 2016).  You will receive a reminder letter in the mail two months in advance. If you don't receive a letter, please call our office to schedule the follow-up appointment.

## 2014-03-14 NOTE — Progress Notes (Signed)
Patient ID: [name], male   DOB: 05/15/1950, 63 y.o.   MRN: [id number] Primary Provider: Dr. Juanetta GoslingHawkins   63 yo with history of CAD s/p CABG in 2009 as well as HTN, DM, and hyperlipidemia presents for followup.  He had an ETT-Cardiolite for atypical symptoms in 12/14. This showed a reversible basal to mid anteroseptal defect.  He was really having no anginal symptoms at that point so we decided against LHC.  Since then, he has had no chest pain.  He continues to work as a Secretary/administratorcabinet-maker.  He has mild dyspnea walking up a hill but no problems on flat ground.  He has some soreness in his joints and muscles.  He has lost 10 lbs since last appointment.   ECG: NSR, inferior Qs, nonspecific anterolateral T wave inversions  Labs (5/11): LDL 21, HDL 38, TGs 339, K 4.1, creatinine 1.2  Labs (12/14): K 4.3, creatinine 1.1 Labs (5/15): LDL 61, HDL 39, TGs 145  Allergies:  1) Amoxicillin (Amoxicillin)   Past Medical History:  1. CAD: Multiple PCI procedures followed by CABG in 2009 with LIMA-LAD, SVG-D, seq SVG-OM and PLV, SVG-PDA.  Echo (3/12): EF 60-65%, mild MR.  ETT-Cardiolite in 12/14 with a reversible basal to mid anteroseptal defect with EF 54%.  2. Insulin-dependent diabetes: Followed by Dr. Chestine Sporelark.  Has diabetic foot ulcer.  3. Hypertension.  4. Hyperlipidemia.  5. Postoperative atrial fibrillation after CABG in 2009  6. Carotid dopplers (1/14) with 0-39% bilateral ICA stenosis.   Family History:  Very strongly positive for coronary disease. 2 brothers with CABG. Multiple other family members had heart disease.   Social History:  He works at [employer] in [place] but lives up in [place]. He is married and lives with his wife. No children. He has never smoked, but used to chew some tobacco. He denies alcohol abuse. Originally from [place].   ROS: All systems reviewed and negative except as per HPI.   Current Outpatient Prescriptions  Medication Sig Dispense Refill  .  amitriptyline (ELAVIL) 75 MG tablet Take 75 mg by mouth at bedtime.    [name]. aspirin EC 81 MG tablet Take 1 tablet (81 mg total) by mouth daily.    . clopidogrel (PLAVIX) 75 MG tablet TAKE 1 TABLET (75 MG TOTAL) BY MOUTH DAILY. 90 tablet 1  . ezetimibe (ZETIA) 10 MG tablet Take 1 tablet (10 mg total) by mouth daily. 90 tablet 3  . fenofibrate 54 MG tablet TAKE 1 TABLET BY MOUTH DAILY. 30 tablet 0  . furosemide (LASIX) 40 MG tablet Take 1 tablet (40 mg total) by mouth daily. 90 tablet 3  . metFORMIN (GLUCOPHAGE) 1000 MG tablet Take 1,000 mg by mouth 2 (two) times daily with a meal.    . metoprolol (LOPRESSOR) 50 MG tablet Take 2 tablets (100 mg total) by mouth 2 (two) times daily. 120 tablet 6  . nitroGLYCERIN (NITROSTAT) 0.4 MG SL tablet Place 1 tablet (0.4 mg total) under the tongue every 5 (five) minutes as needed. For chest pain 25 tablet 3  . NOVOFINE 30G X 8 MM MISC     . NOVOLOG MIX 70/30 FLEXPEN (70-30) 100 UNIT/ML Pen Inject 40-60 Units into the skin 2 (two) times daily.     . pravastatin (PRAVACHOL) 80 MG tablet Take 1 tablet (80 mg total) by mouth every evening. 90 tablet 3  . ramipril (ALTACE) 10 MG capsule TAKE 1 CAPSULE (10 MG TOTAL) BY MOUTH EVERY MORNING. 90 capsule 1  .  Coenzyme Q10 200 MG TABS Take 1 tablet (200 mg total) by mouth daily.     No current facility-administered medications for this visit.    BP 132/72 mmHg  Pulse 70  Ht 5\' 9"  (1.753 m)  Wt 233 lb (105.688 kg)  BMI 34.39 kg/m2 General: NAD Neck: Thick, no JVD, no thyromegaly or thyroid nodule.  Lungs: Clear to auscultation bilaterally with normal respiratory effort. CV: Nondisplaced PMI.  Heart regular S1/S2, no S3/S4, no murmur.  No edema.  No carotid bruit.  Normal pedal pulses on right, boot on left.  Abdomen: Soft, nontender, no hepatosplenomegaly, no distention.  Neurologic: Alert and oriented x 3.  Psych: Normal affect. Extremities: No clubbing or cyanosis.   Assessment/Plan: 1. CAD: Patient had an  intermediate risk Cardiolite in 12/14 but has had no further anginal symptoms so we continue to manage medically.   He has been doing well lately.  Continue ASA, Plavix, statin, ramipril, and metoprolol.  Check BMET today.  2. Hyperlipidemia: LDL < 70 (at goal).  Continue current statin. He has some myalgias but not too bothersome.  I will have him try coenzyme Q10 200 mg daily.  If myalgias get more bothersome, can try holding statin for a few weeks and switching to Crestor 5 mg every other day.  Check lipids again in 6/16.  3. HTN: BP is well-controlled.  4. Followup in 1 year.   Marca AnconaDalton Tykeria Wawrzyniak 03/14/2014

## 2014-03-16 ENCOUNTER — Telehealth: Payer: Self-pay | Admitting: General Practice

## 2014-03-16 NOTE — Telephone Encounter (Signed)
New Message        Patient states someone called him; please give patient a call

## 2014-03-16 NOTE — Telephone Encounter (Signed)
Returned call to pt.  Notified pt of lab results & per Dr. Shirlee LatchMcLean decrease Lasix to 20mg  daily with repeat bmp in 2-3 weeks.  He will call back to schedule lab work. I offered to him ( as he lives in [place]) to have lab drawn in [place] at [facility] if that were more convenient or here at the [address] [facility].  Mylo Redebbie Fahima Cifelli RN

## 2014-03-26 ENCOUNTER — Other Ambulatory Visit: Payer: Self-pay | Admitting: Cardiology

## 2014-03-27 ENCOUNTER — Other Ambulatory Visit: Payer: Self-pay | Admitting: Physician Assistant

## 2014-06-08 ENCOUNTER — Other Ambulatory Visit: Payer: Self-pay | Admitting: *Deleted

## 2014-06-08 MED ORDER — METOPROLOL TARTRATE 50 MG PO TABS: 100.0000 mg | ORAL_TABLET | Freq: Two times a day (BID) | ORAL | Status: DC

## 2014-06-16 ENCOUNTER — Other Ambulatory Visit: Payer: Self-pay | Admitting: Cardiology

## 2014-06-20 ENCOUNTER — Other Ambulatory Visit: Payer: Self-pay | Admitting: Cardiology

## 2014-09-17 ENCOUNTER — Other Ambulatory Visit (INDEPENDENT_AMBULATORY_CARE_PROVIDER_SITE_OTHER): Payer: BLUE CROSS/BLUE SHIELD | Admitting: *Deleted

## 2014-09-17 DIAGNOSIS — I10 Essential (primary) hypertension: Secondary | ICD-10-CM

## 2014-09-17 DIAGNOSIS — E785 Hyperlipidemia, unspecified: Secondary | ICD-10-CM

## 2014-09-17 DIAGNOSIS — I25810 Atherosclerosis of coronary artery bypass graft(s) without angina pectoris: Secondary | ICD-10-CM

## 2014-09-17 LAB — LIPID PANEL
Cholesterol: 175 mg/dL (ref 0–200)
HDL: 36.2 mg/dL — ABNORMAL LOW (ref >39.00)
NonHDL: 138.8
TRIGLYCERIDES: 310 mg/dL — AB (ref 0.0–149.0)
Total CHOL/HDL Ratio: 5
VLDL: 62 mg/dL — AB (ref 0.0–40.0)

## 2014-09-17 LAB — LDL CHOLESTEROL, DIRECT: Direct LDL: 109 mg/dL

## 2014-09-17 NOTE — Addendum Note (Signed)
Addended by: Tonita Phoenix on: 09/17/2014 07:34 AM   Modules accepted: Orders

## 2014-09-18 ENCOUNTER — Ambulatory Visit: Payer: Self-pay | Admitting: Surgery

## 2014-09-19 ENCOUNTER — Other Ambulatory Visit: Payer: Self-pay | Admitting: Physician Assistant

## 2014-09-19 ENCOUNTER — Other Ambulatory Visit: Payer: Self-pay | Admitting: Cardiology

## 2014-09-25 ENCOUNTER — Telehealth: Payer: Self-pay

## 2014-09-25 MED ORDER — ROSUVASTATIN CALCIUM 5 MG PO TABS: 5.0000 mg | ORAL_TABLET | Freq: Every day | ORAL | Status: DC

## 2014-09-25 MED ORDER — EZETIMIBE 10 MG PO TABS: ORAL_TABLET | ORAL | Status: DC

## 2014-09-25 NOTE — Telephone Encounter (Signed)
Pt setup for follow up lab appt on 8:45am on 8/22.  Notes Recorded by Theressa Stamps, RN on 09/25/2014 at 8:17 AM Pt made aware of results. Pt willing to try Crestor 5mg  daily but stated that previously it was a problem with his insurance. Informed him that medication is now generic so it should not be a problem. In addition, pt stated he has taken this medication before an he was taking 20 mg of Crestor.  Sending in Rx for Crestor and refills of Zetia 10 mg to preferred pharmacy.

## 2014-09-25 NOTE — Telephone Encounter (Signed)
-----   Message from Laurey Morale, MD sent at 09/21/2014  3:45 PM EDT ----- LDL high, if he is still taking pravastatin, would switch to Crestor 5 daily with lipids/LFTs in 2 months.  May have less myalgias.

## 2014-11-21 ENCOUNTER — Telehealth: Payer: Self-pay | Admitting: Cardiology

## 2014-11-21 DIAGNOSIS — E785 Hyperlipidemia, unspecified: Secondary | ICD-10-CM

## 2014-11-21 NOTE — Telephone Encounter (Signed)
New message      Pt is due for labs on Monday.  He just had labs drawn in June.  Why will he need more labs drawn?

## 2014-11-21 NOTE — Telephone Encounter (Signed)
Notes Recorded by Laurey Morale, MD on 09/21/2014 at 3:45 PM LDL high, if he is still taking pravastatin, would switch to Crestor 5 daily with lipids/LFTs in 2 months. May have less myalgias.   According to notes above patient needs lipids and LFTs. Patient is schedule, but has no orders. Will put in order. Patient verbalized understanding after reminding him of his lab results and medication changes on 09/25/2014.

## 2014-11-21 NOTE — Telephone Encounter (Signed)
Left message to call back. In lab results on 09/25/2014, patient is to have repeat labs in two months after medication changes have been made. Will inform patient when he calls back.

## 2014-11-26 ENCOUNTER — Other Ambulatory Visit (INDEPENDENT_AMBULATORY_CARE_PROVIDER_SITE_OTHER): Payer: BLUE CROSS/BLUE SHIELD

## 2014-11-26 DIAGNOSIS — E785 Hyperlipidemia, unspecified: Secondary | ICD-10-CM | POA: Diagnosis not present

## 2014-11-26 LAB — LIPID PANEL
CHOLESTEROL: 134 mg/dL (ref 0–200)
HDL: 34.1 mg/dL — ABNORMAL LOW (ref >39.00)
NonHDL: 99.56
Total CHOL/HDL Ratio: 4
Triglycerides: 233 mg/dL — ABNORMAL HIGH (ref 0.0–149.0)
VLDL: 46.6 mg/dL — ABNORMAL HIGH (ref 0.0–40.0)

## 2014-11-26 LAB — HEPATIC FUNCTION PANEL
ALK PHOS: 53 U/L (ref 39–117)
ALT: 19 U/L (ref 0–53)
AST: 20 U/L (ref 0–37)
Albumin: 4.2 g/dL (ref 3.5–5.2)
Bilirubin, Direct: 0.1 mg/dL (ref 0.0–0.3)
TOTAL PROTEIN: 6.7 g/dL (ref 6.0–8.3)
Total Bilirubin: 0.4 mg/dL (ref 0.2–1.2)

## 2014-11-26 LAB — LDL CHOLESTEROL, DIRECT: Direct LDL: 78 mg/dL

## 2015-03-20 ENCOUNTER — Other Ambulatory Visit (HOSPITAL_COMMUNITY): Payer: Self-pay | Admitting: *Deleted

## 2015-03-20 MED ORDER — CLOPIDOGREL BISULFATE 75 MG PO TABS: 75.0000 mg | ORAL_TABLET | Freq: Every day | ORAL | Status: DC

## 2015-03-20 MED ORDER — RAMIPRIL 10 MG PO CAPS: ORAL_CAPSULE | ORAL | Status: DC

## 2015-03-20 MED ORDER — METOPROLOL TARTRATE 50 MG PO TABS: 100.0000 mg | ORAL_TABLET | Freq: Two times a day (BID) | ORAL | Status: DC

## 2015-03-21 ENCOUNTER — Other Ambulatory Visit: Payer: Self-pay | Admitting: Cardiology

## 2015-04-09 NOTE — Progress Notes (Signed)
Cardiology Office Note    Date:  04/10/2015   ID:  [name], DOB Sep 25, 1950, MRN [id number]  PCP:  [name] Maudlin, MD  Cardiologist:  Dr. Marca [name]   Electrophysiologist:  n/a  Chief Complaint  Patient presents with  . Follow-up  . Coronary Artery Disease    History of Present Illness:  [name] is a 65 y.o. male with a hx of CAD s/p CABG in 2009 as well as HTN, T2DM, and HL.  He had an ETT-Cardiolite for atypical symptoms in 12/14. This showed a reversible basal to mid anteroseptal defect. He was really having no anginal symptoms at that point so we decided against LHC. Last seen by Dr. Marca [name] 12/15.    He returns for FU.  He is doing well.  He denies chest pain, significant dyspnea, orthopnea, PND, edema, syncope.     Past Medical History  Diagnosis Date  . CAD (coronary artery disease), native coronary artery     Multiple PCI procedures followed by CABG in 2009 with LIMA-LAD, SVG-D, seq SVG-OM and PLV, SVG-PDA. Echo (3/12): EF 60-65%, mild MR.   [name] DM2 (diabetes mellitus, type 2) (HCC)     Followed by Dr. Chestine [name]  . HLD (hyperlipidemia)   . HTN (hypertension)   . Postoperative atrial fibrillation (HCC) 2009  . Carotid stenosis     dopplers 1/14:  0-39% bilat  . Colon polyps 2006    Colonoscopy-Dr. Patterson(Tubular Adenoma)   . Cataract     REMOVED  . Hx of cardiovascular stress test     ETT-Myoview (03/2013):  Anteroseptal reversible defect, EF 54%, intermediate risk.  [name] Ulcer     left foot  1. CAD: Multiple PCI procedures followed by CABG in 2009 with LIMA-LAD, SVG-D, seq SVG-OM and PLV, SVG-PDA. Echo (3/12): EF 60-65%, mild MR. ETT-Cardiolite in 12/14 with a reversible basal to mid anteroseptal defect with EF 54%.  2. Insulin-dependent diabetes: Followed by Dr. Chestine [name]. Has diabetic foot ulcer.  3. Hypertension.  4. Hyperlipidemia.  5. Postoperative atrial fibrillation after CABG in 2009  6. Carotid dopplers (1/14) with 0-39%  bilateral ICA stenosis.    Past Surgical History  Procedure Laterality Date  . Coronary artery bypass graft  2009    post op. a fib, LIMA -LAD, SVG-D,seq SVG-OM and PLV,  SVG-PDA.  . Cardiac catheterization  2009    EF 60%  . Back surgery  2006  . Appendectomy  2005  . Tonsillectomy    . Colonoscopy      Current Outpatient Prescriptions  Medication Sig Dispense Refill  . amitriptyline (ELAVIL) 75 MG tablet Take 75 mg by mouth at bedtime.    [name] aspirin EC 81 MG tablet Take 1 tablet (81 mg total) by mouth daily.    . clopidogrel (PLAVIX) 75 MG tablet Take 1 tablet (75 mg total) by mouth daily. 90 tablet 3  . ezetimibe (ZETIA) 10 MG tablet TAKE 1 TABLET (10 MG TOTAL) BY MOUTH DAILY. 90 tablet 3  . fenofibrate 54 MG tablet Take 1 tablet (54 mg total) by mouth daily. 90 tablet 3  . furosemide (LASIX) 40 MG tablet TAKE 1 TABLET (40 MG TOTAL) BY MOUTH DAILY. 90 tablet 3  . metFORMIN (GLUCOPHAGE) 1000 MG tablet Take 1,000 mg by mouth 2 (two) times daily with a meal.    . metoprolol (LOPRESSOR) 50 MG tablet Take 2 tablets (100 mg total) by mouth 2 (two) times daily. 360 tablet 3  . nitroGLYCERIN (NITROSTAT)  0.4 MG SL tablet Place 1 tablet (0.4 mg total) under the tongue every 5 (five) minutes as needed. For chest pain 25 tablet 3  . NOVOFINE 30G X 8 MM MISC Inject 1 packet into the skin as needed (BLOOD SUGAR).     [name]. NOVOLOG MIX 70/30 FLEXPEN (70-30) 100 UNIT/ML Pen Inject 40-60 Units into the skin 2 (two) times daily.     . ONE TOUCH ULTRA TEST test strip 1 each by Other route as needed for other (BLOOD SUGAR). as directed  3  . ramipril (ALTACE) 10 MG capsule TAKE 1 CAPSULE (10 MG TOTAL) BY MOUTH EVERY MORNING. 90 capsule 3  . rosuvastatin (CRESTOR) 5 MG tablet Take 1 tablet (5 mg total) by mouth daily. 90 tablet 3  . VICTOZA 18 MG/3ML SOPN INJECT 0.6 MG UNDER THE SKIN DAILY X 1 WK, 1.2 MG DAILY X 1 WK THEN 1.8 MG DAILY FOR 1 WK  3   No current facility-administered medications for this  visit.    Allergies:   Penicillins   Social History   Social History  . Marital Status: Married    Spouse Name: N/A  . Number of Children: 0  . Years of Education: N/A   Occupational History  . Financial plannerCabinet Making Business     Owner   Social History Main Topics  . Smoking status: Never Smoker   . Smokeless tobacco: Former NeurosurgeonUser    Types: Chew    Quit date: 04/06/1993  . Alcohol Use: No  . Drug Use: No  . Sexual Activity: Not Asked   Other Topics Concern  . 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   Originally from [place].     Family History:  The patient's family history includes Colon cancer in his mother; Diabetes in his brother and father; Other in his brother and brother; Prostate cancer in his brother and father.   ROS:   Please see the history of present illness.    Review of Systems  Cardiovascular: Positive for leg swelling.  Hematologic/Lymphatic: Positive for bleeding problem. Bruises/bleeds easily.  Musculoskeletal: Positive for joint swelling.  Neurological: Positive for loss of balance.  All other systems reviewed and are negative.   PHYSICAL EXAM:   VS:  BP 102/60 mmHg  Pulse 88  Ht 5\' 10"  (1.778 m)  Wt 232 lb 12.8 oz (105.597 kg)  BMI 33.40 kg/m2   GEN: Well nourished, well developed, in no acute distress HEENT: normal Neck: no JVD, no masses Cardiac: Normal S1/S2, RRR; no murmurs, rubs, or gallops, no edema;  no carotid bruits,   Respiratory:  clear to auscultation bilaterally; no wheezing, rhonchi or rales GI: soft, nontender, nondistended, + BS MS: no deformity or atrophy Skin: warm and dry, no rash Neuro:  Bilateral strength equal, no focal deficits  Psych: Alert and oriented x 3, normal affect  Wt Readings from Last 3 Encounters:  04/10/15 232 lb 12.8 oz (105.597 kg)  03/14/14 233 lb (105.688 kg)  12/13/13 230 lb (104.327 kg)      Studies/Labs Reviewed:   EKG:  EKG is  ordered today.  The ekg ordered today demonstrates NSR, HR 75, normal axis, QTc 448 ms, no change from prior tracing  Recent Labs: 11/26/2014: ALT 19   Recent Lipid Panel    Component Value Date/Time   CHOL 134 11/26/2014 1010   TRIG 233.0* 11/26/2014 1010   HDL 34.10* 11/26/2014 1010  CHOLHDL 4 11/26/2014 1010   VLDL 46.6* 11/26/2014 1010   LDLCALC 61 08/22/2013 0836   LDLDIRECT 78.0 11/26/2014 1010    Additional studies/ records that were reviewed today include:   ETT-Myoview (03/14/13): Ex 9:00, ECG with 1 mm ST depression inferiorly, primarily reversible medium-sized anteroseptal defect, EF 54% (intermediate risk).  Carotid US (04/2012): Mild plaque bilaterally (0-39%) => repeat in 1 year.    ASSESSMENT:    1. Coronary artery disease involving native coronary artery of native heart with angina pectoris (HCC)   2. Hypertension, essential   3. Hyperlipidemia     PLAN:  In order of problems listed above:  1. CAD - s/p CABG in 2009.  Myoview intermediate risk in 2014.  He was managed medically and has remained stable on current therapy.  Continue current medications which include ASA, Plavix, statin, beta-blocker, ACE inhibitor.  2. HTN - Controlled.  Continue current Rx.  3. HL - LDL in 8/16 was 78.  Continue current dose of Rosuvastatin.    Medication Adjustments/Labs and Tests Ordered: Current medicines are reviewed at length with the patient today.  Concerns regarding medicines are outlined above.  Medication changes, Labs and Tests ordered today are listed below. Patient Instructions  Medication Instructions:  REFILLS WERE SENT IN TODAY FOR METOPROLOL, PLAVIX, CRESTOR, ZETIA, RAMIPRIL, FENOFIBRATE, LASIX  Labwork: NONE  Testing/Procedures: NONE  Follow-Up: Your physician wants you to follow-up in: 1 YEAR WITH DR. Shirlee Latch receive a reminder letter in the mail two months in advance. If you don't receive a letter, please call our office to schedule the  follow-up appointment.   Any Other Special Instructions Will Be Listed Below (If Applicable).   If you need a refill on your cardiac medications before your next appointment, please call your pharmacy.    Signed, Tereso Newcomer, PA-C  04/10/2015 4:31 PM    [facility] [address] [place], [place], [place]  [address] Phone: [phone]; Fax: [phone]

## 2015-04-10 ENCOUNTER — Ambulatory Visit (INDEPENDENT_AMBULATORY_CARE_PROVIDER_SITE_OTHER): Payer: BLUE CROSS/BLUE SHIELD | Admitting: Physician Assistant

## 2015-04-10 ENCOUNTER — Encounter: Payer: Self-pay | Admitting: Physician Assistant

## 2015-04-10 VITALS — BP 102/60 | HR 88 | Ht 70.0 in | Wt 232.8 lb

## 2015-04-10 DIAGNOSIS — I25119 Atherosclerotic heart disease of native coronary artery with unspecified angina pectoris: Secondary | ICD-10-CM

## 2015-04-10 DIAGNOSIS — I10 Essential (primary) hypertension: Secondary | ICD-10-CM

## 2015-04-10 DIAGNOSIS — E785 Hyperlipidemia, unspecified: Secondary | ICD-10-CM

## 2015-04-10 DIAGNOSIS — I2510 Atherosclerotic heart disease of native coronary artery without angina pectoris: Secondary | ICD-10-CM

## 2015-04-10 MED ORDER — CLOPIDOGREL BISULFATE 75 MG PO TABS: 75.0000 mg | ORAL_TABLET | Freq: Every day | ORAL | Status: DC

## 2015-04-10 MED ORDER — ROSUVASTATIN CALCIUM 5 MG PO TABS: 5.0000 mg | ORAL_TABLET | Freq: Every day | ORAL | Status: DC

## 2015-04-10 MED ORDER — RAMIPRIL 10 MG PO CAPS: ORAL_CAPSULE | ORAL | Status: DC

## 2015-04-10 MED ORDER — FUROSEMIDE 40 MG PO TABS: ORAL_TABLET | ORAL | Status: DC

## 2015-04-10 MED ORDER — FENOFIBRATE 54 MG PO TABS: 54.0000 mg | ORAL_TABLET | Freq: Every day | ORAL | Status: DC

## 2015-04-10 MED ORDER — METOPROLOL TARTRATE 50 MG PO TABS
100.0000 mg | ORAL_TABLET | Freq: Two times a day (BID) | ORAL | Status: DC
Start: 2015-04-10 — End: 2016-03-27

## 2015-04-10 MED ORDER — EZETIMIBE 10 MG PO TABS: ORAL_TABLET | ORAL | Status: DC

## 2015-04-10 NOTE — Patient Instructions (Signed)
Medication Instructions:  REFILLS WERE SENT IN TODAY FOR METOPROLOL, PLAVIX, CRESTOR, ZETIA, RAMIPRIL, FENOFIBRATE, LASIX  Labwork: NONE  Testing/Procedures: NONE  Follow-Up: Your physician wants you to follow-up in: 1 YEAR WITH DR. Shirlee LatchMCLEAN receive a reminder letter in the mail two months in advance. If you don't receive a letter, please call our office to schedule the follow-up appointment.   Any Other Special Instructions Will Be Listed Below (If Applicable).   If you need a refill on your cardiac medications before your next appointment, please call your pharmacy.

## 2015-06-11 ENCOUNTER — Other Ambulatory Visit: Payer: Self-pay | Admitting: Cardiology

## 2015-11-04 ENCOUNTER — Other Ambulatory Visit: Payer: Self-pay | Admitting: Physician Assistant

## 2015-11-04 DIAGNOSIS — R079 Chest pain, unspecified: Secondary | ICD-10-CM

## 2015-11-26 ENCOUNTER — Encounter (HOSPITAL_BASED_OUTPATIENT_CLINIC_OR_DEPARTMENT_OTHER): Payer: Self-pay

## 2015-11-26 ENCOUNTER — Encounter (HOSPITAL_BASED_OUTPATIENT_CLINIC_OR_DEPARTMENT_OTHER): Payer: BLUE CROSS/BLUE SHIELD | Attending: Surgery

## 2015-11-26 DIAGNOSIS — I10 Essential (primary) hypertension: Secondary | ICD-10-CM | POA: Diagnosis not present

## 2015-11-26 DIAGNOSIS — I2510 Atherosclerotic heart disease of native coronary artery without angina pectoris: Secondary | ICD-10-CM | POA: Diagnosis not present

## 2015-11-26 DIAGNOSIS — L97522 Non-pressure chronic ulcer of other part of left foot with fat layer exposed: Secondary | ICD-10-CM | POA: Insufficient documentation

## 2015-11-26 DIAGNOSIS — Z794 Long term (current) use of insulin: Secondary | ICD-10-CM | POA: Diagnosis not present

## 2015-11-26 DIAGNOSIS — E785 Hyperlipidemia, unspecified: Secondary | ICD-10-CM | POA: Diagnosis not present

## 2015-11-26 DIAGNOSIS — Z7982 Long term (current) use of aspirin: Secondary | ICD-10-CM | POA: Insufficient documentation

## 2015-11-26 DIAGNOSIS — E11621 Type 2 diabetes mellitus with foot ulcer: Secondary | ICD-10-CM | POA: Insufficient documentation

## 2015-11-26 DIAGNOSIS — Z7902 Long term (current) use of antithrombotics/antiplatelets: Secondary | ICD-10-CM | POA: Insufficient documentation

## 2015-11-26 DIAGNOSIS — Z79899 Other long term (current) drug therapy: Secondary | ICD-10-CM | POA: Diagnosis not present

## 2015-12-03 DIAGNOSIS — E11621 Type 2 diabetes mellitus with foot ulcer: Secondary | ICD-10-CM | POA: Diagnosis not present

## 2015-12-10 ENCOUNTER — Encounter (HOSPITAL_BASED_OUTPATIENT_CLINIC_OR_DEPARTMENT_OTHER): Payer: BLUE CROSS/BLUE SHIELD | Attending: Surgery

## 2015-12-10 DIAGNOSIS — I2510 Atherosclerotic heart disease of native coronary artery without angina pectoris: Secondary | ICD-10-CM | POA: Diagnosis not present

## 2015-12-10 DIAGNOSIS — E11621 Type 2 diabetes mellitus with foot ulcer: Secondary | ICD-10-CM | POA: Diagnosis present

## 2015-12-10 DIAGNOSIS — L97521 Non-pressure chronic ulcer of other part of left foot limited to breakdown of skin: Secondary | ICD-10-CM | POA: Diagnosis not present

## 2015-12-10 DIAGNOSIS — E785 Hyperlipidemia, unspecified: Secondary | ICD-10-CM | POA: Diagnosis not present

## 2015-12-10 DIAGNOSIS — I10 Essential (primary) hypertension: Secondary | ICD-10-CM | POA: Insufficient documentation

## 2015-12-17 DIAGNOSIS — E11621 Type 2 diabetes mellitus with foot ulcer: Secondary | ICD-10-CM | POA: Diagnosis not present

## 2016-03-27 ENCOUNTER — Encounter (INDEPENDENT_AMBULATORY_CARE_PROVIDER_SITE_OTHER): Payer: Self-pay

## 2016-03-27 ENCOUNTER — Encounter: Payer: Self-pay | Admitting: Physician Assistant

## 2016-03-27 ENCOUNTER — Ambulatory Visit (INDEPENDENT_AMBULATORY_CARE_PROVIDER_SITE_OTHER): Payer: BLUE CROSS/BLUE SHIELD | Admitting: Physician Assistant

## 2016-03-27 VITALS — BP 120/60 | HR 63 | Ht 70.0 in | Wt 233.0 lb

## 2016-03-27 DIAGNOSIS — I2510 Atherosclerotic heart disease of native coronary artery without angina pectoris: Secondary | ICD-10-CM

## 2016-03-27 DIAGNOSIS — I739 Peripheral vascular disease, unspecified: Secondary | ICD-10-CM

## 2016-03-27 DIAGNOSIS — E7800 Pure hypercholesterolemia, unspecified: Secondary | ICD-10-CM | POA: Diagnosis not present

## 2016-03-27 DIAGNOSIS — I779 Disorder of arteries and arterioles, unspecified: Secondary | ICD-10-CM

## 2016-03-27 DIAGNOSIS — I10 Essential (primary) hypertension: Secondary | ICD-10-CM | POA: Diagnosis not present

## 2016-03-27 MED ORDER — ROSUVASTATIN CALCIUM 5 MG PO TABS: 5.0000 mg | ORAL_TABLET | Freq: Every day | ORAL | 3 refills | Status: DC

## 2016-03-27 MED ORDER — FUROSEMIDE 40 MG PO TABS: 40.0000 mg | ORAL_TABLET | Freq: Every day | ORAL | 3 refills | Status: DC

## 2016-03-27 MED ORDER — FENOFIBRATE 54 MG PO TABS: 54.0000 mg | ORAL_TABLET | Freq: Every day | ORAL | 3 refills | Status: DC

## 2016-03-27 MED ORDER — EZETIMIBE 10 MG PO TABS: 10.0000 mg | ORAL_TABLET | Freq: Every day | ORAL | 3 refills | Status: DC

## 2016-03-27 MED ORDER — METOPROLOL TARTRATE 100 MG PO TABS: 100.0000 mg | ORAL_TABLET | Freq: Two times a day (BID) | ORAL | 3 refills | Status: DC

## 2016-03-27 MED ORDER — NITROGLYCERIN 0.4 MG SL SUBL: 0.4000 mg | SUBLINGUAL_TABLET | SUBLINGUAL | 11 refills | Status: DC | PRN

## 2016-03-27 MED ORDER — CLOPIDOGREL BISULFATE 75 MG PO TABS: 75.0000 mg | ORAL_TABLET | Freq: Every day | ORAL | 11 refills | Status: DC

## 2016-03-27 NOTE — Patient Instructions (Signed)
Medication Instructions:  No changes. I refilled the medications that we prescribe to you.  Labwork: None  Testing/Procedures: Schedule carotid ultrasound   Follow-Up: Tereso NewcomerScott Christino Mcglinchey, PA-C in 1 year.  Any Other Special Instructions Will Be Listed Below (If Applicable).  If you need a refill on your cardiac medications before your next appointment, please call your pharmacy.

## 2016-03-27 NOTE — Progress Notes (Signed)
Cardiology Office Note:    Date:  03/27/2016   ID:  [name], DOB 08/10/1950, MRN [id number]  PCP:  Fredirick MaudlinHAWKINS,EDWARD L, MD  Cardiologist:  Dr. Marca Anconaalton McLean   Electrophysiologist:  n/a Endocrinologist: Dr. Margaretmary BayleyPreston Clark  Referring MD: Kari BaarsHawkins, Edward, MD   Chief Complaint  Patient presents with  . Follow-up    CAD    History of Present Illness:    [name] EddyRobert W. Landen is a 65 y.o. male with a hx of CAD s/p CABG in 2009 as well as HTN, T2DM, and HL.  He had an ETT-Cardiolite for atypical symptoms in 12/14. This showed a reversible basal to mid anteroseptal defect. He was really having no anginal symptoms at that point so we decided against LHC. Last seen here by me in 1/17.    Here for Cardiology follow up.  He is here alone.  He is doing well.  He is semi-retired now.  He has a grandchild on the way soon. The patient denies chest pain, shortness of breath, syncope, orthopnea, PND or significant pedal edema.   Prior CV studies that were reviewed today include:    ETT-Myoview (03/14/13):  Ex 9:00, ECG with 1 mm ST depression inferiorly, primarily reversible medium-sized anteroseptal defect, EF 54% (intermediate risk).  Carotid US (04/2012):  Mild plaque bilaterally (0-39%) => repeat in 1 year.    Past Medical History:  Diagnosis Date  . CAD (coronary artery disease), native coronary artery    Multiple PCI procedures followed by CABG in 2009 with LIMA-LAD, SVG-D, seq SVG-OM and PLV, SVG-PDA. Echo (3/12): EF 60-65%, mild MR.   . Carotid stenosis    dopplers 1/14:  0-39% bilat  . Cataract    REMOVED  . Colon polyps 2006   Colonoscopy-Dr. Patterson(Tubular Adenoma)   . DM2 (diabetes mellitus, type 2) (HCC)    Followed by Dr. Chestine Sporelark  . HLD (hyperlipidemia)   . HTN (hypertension)   . Hx of cardiovascular stress test    ETT-Myoview (03/2013):  Anteroseptal reversible defect, EF 54%, intermediate risk.  [name]. Postoperative atrial fibrillation (HCC) 2009  . Ulcer (HCC)    left foot  1. CAD: Multiple PCI procedures followed by CABG in 2009 with LIMA-LAD, SVG-D, seq SVG-OM and PLV, SVG-PDA. Echo (3/12): EF 60-65%, mild MR. ETT-Cardiolite in 12/14 with a reversible basal to mid anteroseptal defect with EF 54%.  2. Insulin-dependent diabetes: Followed by Dr. Chestine Sporelark. Has diabetic foot ulcer.  3. Hypertension.  4. Hyperlipidemia.  5. Postoperative atrial fibrillation after CABG in 2009  6. Carotid dopplers (1/14) with 0-39% bilateral ICA stenosis.   Past Surgical History:  Procedure Laterality Date  . APPENDECTOMY  2005  . BACK SURGERY  2006  . CARDIAC CATHETERIZATION  2009   EF 60%  . COLONOSCOPY    . CORONARY ARTERY BYPASS GRAFT  2009   post op. a fib, LIMA -LAD, SVG-D,seq SVG-OM and PLV,  SVG-PDA.  . TONSILLECTOMY      Current Medications: Current Meds  Medication Sig  . amitriptyline (ELAVIL) 75 MG tablet Take 75 mg by mouth at bedtime.  [name]. amLODipine (NORVASC) 10 MG tablet Take 10 mg by mouth daily.   [name]. aspirin EC 81 MG tablet Take 1 tablet (81 mg total) by mouth daily.  . metFORMIN (GLUCOPHAGE) 1000 MG tablet Take 1,000 mg by mouth 2 (two) times daily with a meal.  . NOVOFINE 30G X 8 MM MISC Inject 1 packet into the skin as needed (BLOOD SUGAR).   [name]. NOVOLOG  MIX 70/30 FLEXPEN (70-30) 100 UNIT/ML Pen Inject 40-60 Units into the skin 2 (two) times daily.   . ONE TOUCH ULTRA TEST test strip 1 each by Other route as needed for other (BLOOD SUGAR). as directed  . VICTOZA 18 MG/3ML SOPN INJECT 0.6 MG UNDER THE SKIN DAILY X 1 WK, 1.2 MG DAILY X 1 WK THEN 1.8 MG DAILY FOR 1 WK  . [DISCONTINUED] clopidogrel (PLAVIX) 75 MG tablet Take 1 tablet (75 mg total) by mouth daily.  . [DISCONTINUED] ezetimibe (ZETIA) 10 MG tablet TAKE 1 TABLET (10 MG TOTAL) BY MOUTH DAILY.  . [DISCONTINUED] fenofibrate 54 MG tablet Take 1 tablet (54 mg total) by mouth daily.  . [DISCONTINUED] furosemide (LASIX) 40 MG tablet TAKE 1 TABLET (40 MG TOTAL) BY MOUTH DAILY.  .  [DISCONTINUED] metoprolol (LOPRESSOR) 50 MG tablet Take 2 tablets (100 mg total) by mouth 2 (two) times daily.  . [DISCONTINUED] NITROSTAT 0.4 MG SL tablet PLACE 1 TABLET (0.4 MG TOTAL) UNDER THE TONGUE EVERY 5 (FIVE) MINUTES AS NEEDED. FOR CHEST PAIN  . [DISCONTINUED] rosuvastatin (CRESTOR) 5 MG tablet Take 1 tablet (5 mg total) by mouth daily.     Allergies:   Penicillins   Social History   Social History  . Marital status: Married    Spouse name: N/A  . Number of children: 0  . Years of education: N/A   Occupational History  . Financial planner   Social History Main Topics  . Smoking status: Never Smoker  . Smokeless tobacco: Former Neurosurgeon    Types: Chew    Quit date: 04/06/1993  . Alcohol use No  . Drug use: No  . Sexual activity: Not Asked   Other Topics Concern  . 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   Originally from [place].     Family History:  The patient's family history includes Colon cancer in his mother; Diabetes in his brother and father; Other in his brother and brother; Prostate cancer in his brother and father.   ROS:   Please see the history of present illness.    ROS All other systems reviewed and are negative.   EKGs/Labs/Other Test Reviewed:    EKG:  EKG is  ordered today.  The ekg ordered today demonstrates NSR, HR 63, LAD, NSSTTW changes, QTc 421 ms, no changes  Recent Labs: No results found for requested labs within last 8760 hours.   Recent Lipid Panel    Component Value Date/Time   CHOL 134 11/26/2014 1010   TRIG 233.0 (H) 11/26/2014 1010   HDL 34.10 (L) 11/26/2014 1010   CHOLHDL 4 11/26/2014 1010   VLDL 46.6 (H) 11/26/2014 1010   LDLCALC 61 08/22/2013 0836   LDLDIRECT 78.0 11/26/2014 1010     Physical Exam:    VS:  BP 120/60   Pulse 63   Ht 5\' 10"  (1.778 m)   Wt 233 lb (105.7 kg)   BMI 33.43 kg/m     Wt Readings from Last 3 Encounters:    03/27/16 233 lb (105.7 kg)  04/10/15 232 lb 12.8 oz (105.6 kg)  03/14/14 233 lb (105.7 kg)     Physical Exam  Constitutional: He is oriented to person, place, and time. He appears well-developed and well-nourished. No distress.  HENT:  Head: Normocephalic and atraumatic.  Eyes: No scleral icterus.  Neck: No JVD present. Carotid bruit is not present.  Cardiovascular: Normal rate, regular rhythm and normal heart sounds.   No murmur heard. Pulmonary/Chest: Effort normal. He has no wheezes. He has no rales.  Abdominal: There is no tenderness.  Musculoskeletal: He exhibits no edema.  Neurological: He is alert and oriented to person, place, and time.  Skin: Skin is warm and dry.  Psychiatric: He has a normal mood and affect.    ASSESSMENT:    1. Coronary artery disease involving native coronary artery of native heart without angina pectoris   2. Hypertension, essential   3. Pure hypercholesterolemia   4. Bilateral carotid artery disease (HCC)    PLAN:    In order of problems listed above:  1. CAD - s/p CABG in 2009.  Myoview intermediate risk in 2014.  He was managed medically and has remained stable on current therapy.  He denies angina.  With multiple PCI procedures in the past would continue dual antiplatelet Rx.  He is tolerating this.  Continue ASA, Plavix, statin, beta-blocker.  2. HTN - Controlled.  Continue current Rx.  3. HL - LDL was 47 in 12/17.  Continue statin.  4. Carotid artery disease - No Carotid US in 3 year.  Arrange follow up Carotid ultrasound.  Dispo - Given Dr. Freida Busmanalton McLean's move to the [facility], Mr. Rocco SereneBistyga prefers to follow up here with me.  Medication Adjustments/Labs and Tests Ordered: Current medicines are reviewed at length with the patient today.  Concerns regarding medicines are outlined above.  Medication changes, Labs and Tests ordered today are outlined in the Patient Instructions noted below. Patient Instructions  Medication  Instructions:  No changes. I refilled the medications that we prescribe to you.  Labwork: None  Testing/Procedures: Schedule carotid ultrasound   Follow-Up: Tereso NewcomerScott Weaver, PA-C in 1 year.  Any Other Special Instructions Will Be Listed Below (If Applicable).  If you need a refill on your cardiac medications before your next appointment, please call your pharmacy.   Signed, Tereso NewcomerScott Weaver, PA-C  03/27/2016 1:35 PM    [facility] [address], [place], [place]  [address] Phone: [phone]; Fax: [phone]

## 2016-04-01 ENCOUNTER — Inpatient Hospital Stay (HOSPITAL_COMMUNITY): Admission: RE | Admit: 2016-04-01 | Payer: BLUE CROSS/BLUE SHIELD | Source: Ambulatory Visit

## 2016-04-03 ENCOUNTER — Ambulatory Visit: Payer: BLUE CROSS/BLUE SHIELD | Admitting: Physician Assistant

## 2016-04-08 ENCOUNTER — Telehealth: Payer: Self-pay | Admitting: *Deleted

## 2016-04-08 NOTE — Telephone Encounter (Signed)
Pt notified of lab results by phone with verbal understanding to plan of care.  

## 2016-05-13 ENCOUNTER — Encounter (HOSPITAL_BASED_OUTPATIENT_CLINIC_OR_DEPARTMENT_OTHER): Payer: BLUE CROSS/BLUE SHIELD | Attending: Surgery

## 2016-05-13 DIAGNOSIS — L84 Corns and callosities: Secondary | ICD-10-CM | POA: Diagnosis not present

## 2016-05-13 DIAGNOSIS — E119 Type 2 diabetes mellitus without complications: Secondary | ICD-10-CM | POA: Diagnosis not present

## 2016-05-13 DIAGNOSIS — Z951 Presence of aortocoronary bypass graft: Secondary | ICD-10-CM | POA: Diagnosis not present

## 2016-05-26 ENCOUNTER — Encounter (HOSPITAL_BASED_OUTPATIENT_CLINIC_OR_DEPARTMENT_OTHER): Payer: BLUE CROSS/BLUE SHIELD

## 2016-06-10 ENCOUNTER — Other Ambulatory Visit: Payer: Self-pay | Admitting: Physician Assistant

## 2016-06-10 NOTE — Telephone Encounter (Signed)
Medication Detail    Disp Refills Start End   clopidogrel (PLAVIX) 75 MG tablet 30 tablet 11 03/27/2016 03/27/2017   Sig - Route: Take 1 tablet (75 mg total) by mouth daily. - Oral   E-Prescribing Status: Receipt confirmed by pharmacy (03/27/2016 12:51 PM EST)   Pharmacy   [id number] - [place], [place] - [address]

## 2017-01-31 DIAGNOSIS — I739 Peripheral vascular disease, unspecified: Secondary | ICD-10-CM

## 2017-01-31 DIAGNOSIS — I779 Disorder of arteries and arterioles, unspecified: Secondary | ICD-10-CM

## 2017-01-31 HISTORY — DX: Disorder of arteries and arterioles, unspecified: I77.9

## 2017-01-31 NOTE — Progress Notes (Addendum)
Cardiology Office Note:    Date:  02/01/2017   ID:  [name], DOB [birth year], MRN [id number]  PCP:  Sinda Du, MD  Cardiologist:  Dr. Loralie Champagne >> [name], PA-C   Endocrinologist: Dr. Jeanann Lewandowsky  Referring MD: Sinda Du, MD   Chief Complaint  Patient presents with  . Coronary Artery Disease    follow up    History of Present Illness:    [name] is a 66 y.o. male with a hx of coronary artery disease s/p coronary artery bypass grafting in 2009, hypertension, hyperlipidemia, diabetes.  The patient had a Nuclear stress test in 2014 that showed a reversible and mid anteroseptal defect.  He was not having angina and medical therapy was pursued.  Last seen in 03/2016.    [name] returns for follow-up.  He is here alone.  He notes occasional "gas" in his right chest.  This is relieved with belching and seems to occur after meals.  He has not taken nitroglycerin.  He denies exertional chest symptoms or symptoms reminiscent of his previous angina.  He does note shortness of breath with walking up 2 or more flights of steps.  This seems to be fairly stable without significant change.  He notes lower extremity edema at the end of the day after prolonged standing.  He denies paroxysmal nocturnal dyspnea.  He denies syncope.  He does note some weakness in his legs after prolonged sitting.  He denies symptoms consistent with claudication.  Prior CV studies:   The following studies were reviewed today:  ETT-Myoview (03/14/13):  Ex 9:00, ECG with 1 mm ST depression inferiorly, primarily reversible medium-sized anteroseptal defect, EF 54% (intermediate risk).  Carotid US (04/2012):  Mild plaque bilaterally (0-39%) =>repeat in 1 year.   Past Medical History:  Diagnosis Date  . CAD (coronary artery disease), native coronary artery    Multiple PCI procedures followed by CABG in 2009 with LIMA-LAD, SVG-D, seq SVG-OM and PLV, SVG-PDA. Echo (3/12): EF  60-65%, mild MR.   . Carotid stenosis    dopplers 1/14:  0-39% bilat  . Cataract    REMOVED  . Colon polyps 2006   Colonoscopy-Dr. Patterson(Tubular Adenoma)   . DM2 (diabetes mellitus, type 2) ([place])    Followed by Dr. Carlis Abbott  . HLD (hyperlipidemia)   . HTN (hypertension)   . Hx of cardiovascular stress test    ETT-Myoview (03/2013):  Anteroseptal reversible defect, EF 54%, intermediate risk.  [name] Postoperative atrial fibrillation ([place]) 2009  . Ulcer    left foot  1. CAD: Multiple PCI procedures followed by CABG in 2009 with LIMA-LAD, SVG-D, seq SVG-OM and PLV, SVG-PDA. Echo (3/12): EF 60-65%, mild MR. ETT-Cardiolite in 12/14 with a reversible basal to mid anteroseptal defect with EF 54%.  2. Insulin-dependent diabetes: Followed by Dr. Carlis Abbott. Has diabetic foot ulcer.  3. Hypertension.  4. Hyperlipidemia.  5. Postoperative atrial fibrillation after CABG in 2009  6. Carotid dopplers (1/14) with 0-39% bilateral ICA stenosis.   Past Surgical History:  Procedure Laterality Date  . APPENDECTOMY  2005  . BACK SURGERY  2006  . CARDIAC CATHETERIZATION  2009   EF 60%  . COLONOSCOPY    . CORONARY ARTERY BYPASS GRAFT  2009   post op. a fib, LIMA -LAD, SVG-D,seq SVG-OM and PLV,  SVG-PDA.  . TONSILLECTOMY      Current Medications: Current Meds  Medication Sig  . amitriptyline (ELAVIL) 75 MG tablet Take 75 mg by mouth at  bedtime.  [name] amLODipine (NORVASC) 10 MG tablet Take 10 mg by mouth daily.   [name] aspirin EC 81 MG tablet Take 1 tablet (81 mg total) by mouth daily.  . clopidogrel (PLAVIX) 75 MG tablet Take 1 tablet (75 mg total) by mouth daily.  [name] ezetimibe (ZETIA) 10 MG tablet Take 1 tablet (10 mg total) by mouth daily.  . fenofibrate 54 MG tablet Take 1 tablet (54 mg total) by mouth daily.  . furosemide (LASIX) 40 MG tablet Take 1 tablet (40 mg total) by mouth daily.  . metFORMIN (GLUCOPHAGE) 1000 MG tablet Take 1,000 mg by mouth 2 (two) times daily with a meal.  . metoprolol  (LOPRESSOR) 100 MG tablet Take 1 tablet (100 mg total) by mouth 2 (two) times daily.  [name] NOVOFINE 30G X 8 MM MISC Inject 1 packet into the skin as needed (BLOOD SUGAR).   [name] NOVOLOG MIX 70/30 FLEXPEN (70-30) 100 UNIT/ML Pen Inject 40-60 Units into the skin 2 (two) times daily.   . ONE TOUCH ULTRA TEST test strip 1 each by Other route as needed for other (BLOOD SUGAR). as directed  . rosuvastatin (CRESTOR) 5 MG tablet Take 1 tablet (5 mg total) by mouth daily.  [name] VICTOZA 18 MG/3ML SOPN INJECT 0.6 MG UNDER THE SKIN DAILY X 1 WK, 1.2 MG DAILY X 1 WK THEN 1.8 MG DAILY FOR 1 WK     Allergies:   Penicillins   Social History  Substance Use Topics  . Smoking status: Never Smoker  . Smokeless tobacco: Former Systems developer    Types: Chew    Quit date: 04/06/1993  . Alcohol use No     Family Hx: The patient's family history includes Colon cancer in his mother; Coronary artery disease in his unknown relative; Diabetes in his brother and father; Heart disease in his unknown relative; Other in his brother and brother; Prostate cancer in his brother and father.  ROS:   Please see the history of present illness.    Review of Systems  Cardiovascular: Positive for leg swelling.   All other systems reviewed and are negative.   EKGs/Labs/Other Test Reviewed:    EKG:  EKG is  ordered today.  The ekg ordered today demonstrates normal sinus rhythm, heart rate 66, leftward axis, nonspecific ST-T wave changes, QTC 434 ms, no change since prior tracing  Recent Labs: No results found for requested labs within last 8760 hours.   Recent Lipid Panel Lab Results  Component Value Date/Time   CHOL 134 11/26/2014 10:10 AM   TRIG 233.0 (H) 11/26/2014 10:10 AM   HDL 34.10 (L) 11/26/2014 10:10 AM   CHOLHDL 4 11/26/2014 10:10 AM   LDLCALC 61 08/22/2013 08:36 AM   LDLDIRECT 78.0 11/26/2014 10:10 AM    Physical Exam:    VS:  BP 140/72   Pulse 66   Ht 5' 10"  (1.778 m)   Wt 234 lb 1.9 oz (106.2 kg)   SpO2 98%   BMI  33.59 kg/m     Wt Readings from Last 3 Encounters:  02/01/17 234 lb 1.9 oz (106.2 kg)  03/27/16 233 lb (105.7 kg)  04/10/15 232 lb 12.8 oz (105.6 kg)     Physical Exam  Constitutional: He is oriented to person, place, and time. He appears well-developed and well-nourished. No distress.  HENT:  Head: Normocephalic and atraumatic.  Eyes: No scleral icterus.  Neck: No JVD present. Carotid bruit is not present.  Cardiovascular: Normal rate and regular rhythm.  No murmur heard. Pulmonary/Chest: Effort normal. He has no rales.  Abdominal: Soft.  Musculoskeletal: He exhibits no edema.  Neurological: He is alert and oriented to person, place, and time.  Skin: Skin is warm and dry.  Psychiatric: He has a normal mood and affect.    ASSESSMENT:    1. Coronary artery disease involving native coronary artery of native heart without angina pectoris   2. HYPERTENSION, BENIGN   3. Pure hypercholesterolemia   4. Bilateral carotid artery stenosis    PLAN:    In order of problems listed above:  1.  Coronary artery disease involving native coronary artery of native heart without angina pectoris History of CABG in 2009.  Nuclear stress test in 2014 demonstrated anteroseptal defect that was primarily reversible.  However, the patient had no symptoms and medical therapy was continued.  He currently denies any symptoms consistent with angina.  Continue aspirin, Plavix, statin, beta-blocker.  2.  HYPERTENSION, BENIGN Blood pressure somewhat borderline today.  He has not taken any medications yet.  Blood pressures are typically well controlled.  Continue current therapy.  3.  Pure hypercholesterolemia Continue Zetia, fenofibrate, Crestor.  Request CMET, lipids from endocrinologist (to be drawn in 2 weeks).  4.  Bilateral carotid artery stenosis Carotid ultrasound was ordered last year, however the patient prefers to not pursue this testing.  I have asked him to let me know if he changes his  mind.   Dispo:  Return in about 1 year (around 02/01/2018) for Routine Follow Up, w/ [name], PA-C.   Medication Adjustments/Labs and Tests Ordered: Current medicines are reviewed at length with the patient today.  Concerns regarding medicines are outlined above.  Tests Ordered: Orders Placed This Encounter  Procedures  . Lipid Profile  . Comp Met (CMET)  . EKG 12-Lead   Medication Changes: No orders of the defined types were placed in this encounter.   Signed, [name], PA-C  02/01/2017 9:12 AM    [facility] [address], [place], [place]  [address] Phone: [phone]; Fax: [phone]

## 2017-02-01 ENCOUNTER — Encounter: Payer: Self-pay | Admitting: *Deleted

## 2017-02-01 ENCOUNTER — Ambulatory Visit (INDEPENDENT_AMBULATORY_CARE_PROVIDER_SITE_OTHER): Payer: BLUE CROSS/BLUE SHIELD | Admitting: Physician Assistant

## 2017-02-01 ENCOUNTER — Encounter: Payer: Self-pay | Admitting: Physician Assistant

## 2017-02-01 ENCOUNTER — Encounter (INDEPENDENT_AMBULATORY_CARE_PROVIDER_SITE_OTHER): Payer: Self-pay

## 2017-02-01 VITALS — BP 140/72 | HR 66 | Ht 70.0 in | Wt 234.1 lb

## 2017-02-01 DIAGNOSIS — I6523 Occlusion and stenosis of bilateral carotid arteries: Secondary | ICD-10-CM

## 2017-02-01 DIAGNOSIS — E7800 Pure hypercholesterolemia, unspecified: Secondary | ICD-10-CM | POA: Diagnosis not present

## 2017-02-01 DIAGNOSIS — I2510 Atherosclerotic heart disease of native coronary artery without angina pectoris: Secondary | ICD-10-CM

## 2017-02-01 DIAGNOSIS — I10 Essential (primary) hypertension: Secondary | ICD-10-CM | POA: Diagnosis not present

## 2017-02-01 NOTE — Patient Instructions (Addendum)
Medication Instructions:  Your physician recommends that you continue on your current medications as directed. Please refer to the Current Medication list given to you today.   Labwork:  LIPIDS, CMET TO BE DONE AT PCP IN 2 WEEKS  Testing/Procedures: NONE ORDERED  Follow-Up: Your physician wants you to follow-up in: 1 YEAR WITH SCOTT WEAVER [place] You will receive a reminder letter in the mail two months in advance. If you don't receive a letter, please call our office to schedule the follow-up appointment.   Any Other Special Instructions Will Be Listed Below (If Applicable).     If you need a refill on your cardiac medications before your next appointment, please call your pharmacy.

## 2017-02-01 NOTE — Addendum Note (Signed)
Addended by: Galen DaftSANCHEZ, Numa Heatwole A on: 02/01/2017 09:20 AM   Modules accepted: Orders

## 2017-03-26 ENCOUNTER — Ambulatory Visit: Payer: BLUE CROSS/BLUE SHIELD | Admitting: Physician Assistant

## 2017-04-28 ENCOUNTER — Other Ambulatory Visit: Payer: Self-pay | Admitting: Physician Assistant

## 2017-05-13 ENCOUNTER — Other Ambulatory Visit: Payer: Self-pay | Admitting: Physician Assistant

## 2017-06-13 ENCOUNTER — Other Ambulatory Visit: Payer: Self-pay | Admitting: Physician Assistant

## 2017-06-20 ENCOUNTER — Encounter: Payer: Self-pay | Admitting: Gastroenterology

## 2017-09-16 ENCOUNTER — Ambulatory Visit: Payer: BLUE CROSS/BLUE SHIELD | Admitting: Neurology

## 2017-09-30 ENCOUNTER — Other Ambulatory Visit: Payer: Self-pay | Admitting: Physician Assistant

## 2017-10-15 ENCOUNTER — Telehealth: Payer: Self-pay | Admitting: Physician Assistant

## 2017-10-15 NOTE — Telephone Encounter (Signed)
Left detailed message for patient regarding appointment with Tereso NewcomerScott Weaver on 7/22 at 0830. I advised him to call back if he cannot come in at that time.

## 2017-10-15 NOTE — Telephone Encounter (Signed)
New message    Pt c/o of Chest Pain: STAT if CP now or developed within 24 hours  1. Are you having CP right now?  Its more a pressure , has had it for about a month  2. Are you experiencing any other symptoms (ex. SOB, nausea, vomiting, sweating)? no 3. How long have you been experiencing CP? About a month   4. Is your CP continuous or coming and going? Comes and goes - it is random through the night   5. Have you taken Nitroglycerin? No   About a month ago he had a diabetic foot wound , they did a vein and artery check in his leg and they were suppose to send you the results , did you get them?  I offered patient next available appt with Lorin PicketScott and he refused  ?

## 2017-10-15 NOTE — Telephone Encounter (Signed)
Spoke with patient who c/o chest pain x 1 month or longer. He states pain is not severe but is concerning to him because of his cardiac hx. States pain occurs when he first lays down in the bed at night and random other times but does not seem to worsen with exertion.  States he has not taken NTG because pain was not severe enough Denies n/v, diaphoresis, or SOB. He states he refused the appointment with Tereso NewcomerScott Weaver due to nothing available until September. I discussed sooner appointments with a different provider and he states he would prefer to see Scott only.He states if there is a cancellation on Scott's schedule he can usually come in with an hour's notice. I advised that I will send Danielle Rankinarol Fiato, CMA a message to call patient if there are cancellations in the near future. I advised patient to seek immediate medical attention if symptoms worsen. He verbalized understanding and agreement and thanked me for the call.

## 2017-10-15 NOTE — Telephone Encounter (Signed)
I can see him July 22 at 8:30 am Just double book me. If he has worse pain before then, he should go to the ED. Tereso NewcomerScott Naika Noto, PA-C    10/15/2017 4:40 PM

## 2017-10-22 ENCOUNTER — Telehealth: Payer: Self-pay | Admitting: Physician Assistant

## 2017-10-22 NOTE — Telephone Encounter (Signed)
[facility] received [facility] from [facility]. Arterial Dopplers 09/29/17 demonstrated: Normal ABIs bilaterally Abnormal distal arterial doppler waveforms c/w significant bilateral LE runoff disease R peroneal artery 30-49% stenosis  It looks like this study was done for evaluation of a diabetic foot ulcer.

## 2017-10-24 NOTE — Progress Notes (Signed)
Cardiology Office Note:    Date:  10/25/2017   ID:  [name] Eddyobert W. Izzo, DOB 06/14/1950, MRN [id number]  PCP:  Kari BaarsHawkins, Edward, MD  Cardiologist:  Previously Dr. Marca Anconaalton McLean >> will est with MD on Care Team / Tereso NewcomerScott Alok Minshall, PA-C   Referring MD: Kari BaarsHawkins, Edward, MD   Chief Complaint  Patient presents with  . Chest Pain    History of Present Illness:    [name] EddyRobert W. [name] is a 67 y.o. male with coronary artery disease s/p coronary artery bypass grafting in 2009, hypertension, hyperlipidemia, diabetes.  The patient had a Nuclear stress test in 2014 that showed a reversible and mid anteroseptal defect.  He was not having angina and medical therapy was pursued.  Last seen in 01/2017.     [name] returns for the evaluation of chest pain.  He is here today with his wife.  He has recently noticed chest discomfort that mainly occurs at night when he lays down.  This is sometimes left-sided and sometimes it is right-sided.  It is located under his nipples.  He denies exertional chest symptoms.  He does note dyspnea with exertion that is fairly chronic without significant change.  He denies any chest discomfort with positional changes or pleuritic chest pain.  He denies orthopnea or PND.  He does have chronic lower extremity swelling that seems to be worse over the past few weeks.  He denies any weight changes.  Prior CV studies:   The following studies were reviewed today:  ETT-Myoview (03/14/13):  Ex 9:00, ECG with 1 mm ST depression inferiorly, primarily reversible medium-sized anteroseptal defect, EF 54% (intermediate risk).  Carotid US (04/2012):  Mild plaque bilaterally (0-39%) =>repeat in 1 year.   Past Medical History:  Diagnosis Date  . CAD (coronary artery disease), native coronary artery    Multiple PCI procedures followed by CABG in 2009 with LIMA-LAD, SVG-D, seq SVG-OM and PLV, SVG-PDA. Echo (3/12): EF 60-65%, mild MR.   . Carotid stenosis    dopplers 1/14:  0-39% bilat  .  Cataract    REMOVED  . Colon polyps 2006   Colonoscopy-Dr. Patterson(Tubular Adenoma)   . DM2 (diabetes mellitus, type 2) (HCC)    Followed by Dr. Chestine Sporelark  . HLD (hyperlipidemia)   . HTN (hypertension)   . Hx of cardiovascular stress test    ETT-Myoview (03/2013):  Anteroseptal reversible defect, EF 54%, intermediate risk.  [name]. Postoperative atrial fibrillation (HCC) 2009  . Ulcer    left foot  1. CAD: Multiple PCI procedures followed by CABG in 2009 with LIMA-LAD, SVG-D, seq SVG-OM and PLV, SVG-PDA. Echo (3/12): EF 60-65%, mild MR. ETT-Cardiolite in 12/14 with a reversible basal to mid anteroseptal defect with EF 54%.  2. Insulin-dependent diabetes: Followed by Dr. Chestine Sporelark. Has diabetic foot ulcer.  3. Hypertension.  4. Hyperlipidemia.  5. Postoperative atrial fibrillation after CABG in 2009  6. Carotid dopplers (1/14) with 0-39% bilateral ICA stenosis.   Surgical Hx: The patient  has a past surgical history that includes Coronary artery bypass graft (2009); Cardiac catheterization (2009); Back surgery (2006); Appendectomy (2005); Tonsillectomy; and Colonoscopy.   Current Medications: Current Meds  Medication Sig  . amitriptyline (ELAVIL) 75 MG tablet Take 75 mg by mouth at bedtime.  [name]. amLODipine (NORVASC) 10 MG tablet Take 10 mg by mouth daily.   [name]. aspirin EC 81 MG tablet Take 1 tablet (81 mg total) by mouth daily.  . clopidogrel (PLAVIX) 75 MG tablet Take 1 tablet (75 mg total)  by mouth daily.  [name] ezetimibe (ZETIA) 10 MG tablet Take 1 tablet (10 mg total) by mouth daily.  . fenofibrate 54 MG tablet TAKE 1 TABLET (54 MG TOTAL) BY MOUTH DAILY.  . furosemide (LASIX) 40 MG tablet Take 1 tablet (40 mg total) by mouth daily.  . metFORMIN (GLUCOPHAGE) 1000 MG tablet Take 1,000 mg by mouth 2 (two) times daily with a meal.  . metoprolol tartrate (LOPRESSOR) 100 MG tablet Take 1 tablet (100 mg total) by mouth 2 (two) times daily.  . nitroGLYCERIN (NITROSTAT) 0.4 MG SL tablet PLACE 1  TABLET (0.4 MG TOTAL) UNDER THE TONGUE EVERY 5 (FIVE) MINUTES AS NEEDED FOR CHEST PAIN.  [name] NOVOFINE 30G X 8 MM MISC Inject 1 packet into the skin as needed (BLOOD SUGAR).   [name] NOVOLOG MIX 70/30 FLEXPEN (70-30) 100 UNIT/ML Pen Inject 40-60 Units into the skin 2 (two) times daily.   . rosuvastatin (CRESTOR) 5 MG tablet Take 1 tablet (5 mg total) by mouth daily.  [name] VICTOZA 18 MG/3ML SOPN INJECT 0.6 MG UNDER THE SKIN DAILY X 1 WK, 1.2 MG DAILY X 1 WK THEN 1.8 MG DAILY FOR 1 WK     Allergies:   Penicillins   Social History   Tobacco Use  . Smoking status: Never Smoker  . Smokeless tobacco: Former Neurosurgeon    Types: Chew  Substance Use Topics  . Alcohol use: No  . Drug use: No     Family Hx: The patient's family history includes Colon cancer in his mother; Coronary artery disease in his unknown relative; Diabetes in his brother and father; Heart disease in his unknown relative; Other in his brother and brother; Prostate cancer in his brother and father.  ROS:   Please see the history of present illness.    Review of Systems  Cardiovascular: Positive for chest pain, dyspnea on exertion and leg swelling.   All other systems reviewed and are negative.   EKGs/Labs/Other Test Reviewed:    EKG:  EKG is  ordered today.  The ekg ordered today demonstrates sinus bradycardia, heart rate 52, leftward axis, QTC 407, similar to prior tracings  Recent Labs: No results found for requested labs within last 8760 hours.   Recent Lipid Panel Lab Results  Component Value Date/Time   CHOL 134 11/26/2014 10:10 AM   TRIG 233.0 (H) 11/26/2014 10:10 AM   HDL 34.10 (L) 11/26/2014 10:10 AM   CHOLHDL 4 11/26/2014 10:10 AM   LDLCALC 61 08/22/2013 08:36 AM   LDLDIRECT 78.0 11/26/2014 10:10 AM    Physical Exam:    VS:  BP 118/62   Pulse (!) 52   Ht 5\' 9"  (1.753 m)   Wt 246 lb (111.6 kg)   SpO2 97%   BMI 36.33 kg/m     Wt Readings from Last 3 Encounters:  10/25/17 246 lb (111.6 kg)  02/01/17 234 lb  1.9 oz (106.2 kg)  03/27/16 233 lb (105.7 kg)     Physical Exam  Constitutional: He is oriented to person, place, and time. He appears well-developed and well-nourished. No distress.  HENT:  Head: Normocephalic and atraumatic.  Eyes: No scleral icterus.  Neck: No JVD present.  Cardiovascular: Normal rate and regular rhythm.  No murmur heard. Pulmonary/Chest: Effort normal. He has no rales.  Abdominal: Soft.  Musculoskeletal: He exhibits edema (1+ bilat LE edema).  Neurological: He is alert and oriented to person, place, and time.  Skin: Skin is warm and dry.  Psychiatric: He has  a normal mood and affect.    ASSESSMENT & PLAN:    Chest pain, unspecified type  His symptoms are somewhat atypical for ischemia.  He did have a nuclear stress test in 2014 with an anteroseptal defect.  He was not having symptoms at that time medical therapy was continued.  He is a poorly controlled diabetic.  He does note shortness of breath with exertion but denies any significant changes.  I have recommended proceeding with an echocardiogram and stress test.  He also has lower extremity edema and I will check a BNP.  If his BNP is significant elevated, I will adjust his Lasix.  He will be brought back in follow-up in the next several weeks.  -Obtain echocardiogram  -Obtain Lexiscan Myoview  -Obtain BMET, BNP  -Increase Lasix if BNP elevated  Coronary artery disease involving native coronary artery of native heart without angina pectoris History of CABG in 2009.  As noted, he had an abnormal stress test in 2014 that was treated medically due to lack of symptoms.  He is now having some atypical chest pains.  I will obtain a follow-up Lexiscan Myoview.  If this demonstrates intermediate to high risk features, we will need to consider cardiac catheterization.  Continue aspirin, Plavix, beta-blocker, statin.  HYPERTENSION, BENIGN  The patient's blood pressure is controlled on his current regimen.  Continue  current therapy.   Pure hypercholesterolemia  Continue statin.  Obtain follow-up lipids and LFTs.  Type 2 diabetes mellitus with complication, with long-term current use of insulin (HCC) A1c in June 2019 was 9.  Continue follow-up with endocrinology.  PAD (peripheral artery disease) (HCC) He underwent ABIs and lower extremity arterial Dopplers in [place] in June 2019 due to a diabetic foot ulcer.  This demonstrated normal ABIs bilaterally.  However, there was evidence of significant bilateral lower extremity runoff disease.  I will review this further with Dr. Kirke Corin.  He needs further evaluation, I will have him referred to Dr. Kirke Corin or Dr. Allyson Sabal.   Dispo:  Return in about 6 weeks (around 12/06/2017) for Follow up after testing, w/ Tereso Newcomer, PA-C.   Medication Adjustments/Labs and Tests Ordered: Current medicines are reviewed at length with the patient today.  Concerns regarding medicines are outlined above.  Tests Ordered: Orders Placed This Encounter  Procedures  . Basic Metabolic Panel (BMET)  . Hepatic function panel  . Lipid Profile  . Pro b natriuretic peptide  . Myocardial Perfusion Imaging  . EKG 12-Lead  . ECHOCARDIOGRAM COMPLETE   Medication Changes: No orders of the defined types were placed in this encounter.   Signed, Tereso Newcomer, PA-C  10/25/2017 1:37 PM    [facility] [address] [place], [place], [place]  [address] Phone: [phone]; Fax: [phone]

## 2017-10-25 ENCOUNTER — Ambulatory Visit: Payer: BLUE CROSS/BLUE SHIELD | Admitting: Physician Assistant

## 2017-10-25 ENCOUNTER — Encounter: Payer: Self-pay | Admitting: Physician Assistant

## 2017-10-25 ENCOUNTER — Encounter (INDEPENDENT_AMBULATORY_CARE_PROVIDER_SITE_OTHER): Payer: Self-pay

## 2017-10-25 ENCOUNTER — Encounter: Payer: Self-pay | Admitting: *Deleted

## 2017-10-25 VITALS — BP 118/62 | HR 52 | Ht 69.0 in | Wt 246.0 lb

## 2017-10-25 DIAGNOSIS — Z794 Long term (current) use of insulin: Secondary | ICD-10-CM

## 2017-10-25 DIAGNOSIS — I2510 Atherosclerotic heart disease of native coronary artery without angina pectoris: Secondary | ICD-10-CM | POA: Diagnosis not present

## 2017-10-25 DIAGNOSIS — I739 Peripheral vascular disease, unspecified: Secondary | ICD-10-CM | POA: Diagnosis not present

## 2017-10-25 DIAGNOSIS — E118 Type 2 diabetes mellitus with unspecified complications: Secondary | ICD-10-CM

## 2017-10-25 DIAGNOSIS — I10 Essential (primary) hypertension: Secondary | ICD-10-CM | POA: Diagnosis not present

## 2017-10-25 DIAGNOSIS — R079 Chest pain, unspecified: Secondary | ICD-10-CM

## 2017-10-25 DIAGNOSIS — E7800 Pure hypercholesterolemia, unspecified: Secondary | ICD-10-CM

## 2017-10-25 LAB — BASIC METABOLIC PANEL
BUN/Creatinine Ratio: 12 (ref 10–24)
BUN: 14 mg/dL (ref 8–27)
CHLORIDE: 101 mmol/L (ref 96–106)
CO2: 23 mmol/L (ref 20–29)
Calcium: 9.3 mg/dL (ref 8.6–10.2)
Creatinine, Ser: 1.19 mg/dL (ref 0.76–1.27)
GFR calc Af Amer: 74 mL/min/{1.73_m2} (ref >59)
GFR calc non Af Amer: 64 mL/min/{1.73_m2} (ref >59)
Glucose: 233 mg/dL — ABNORMAL HIGH (ref 65–99)
Potassium: 4.1 mmol/L (ref 3.5–5.2)
Sodium: 141 mmol/L (ref 134–144)

## 2017-10-25 LAB — HEPATIC FUNCTION PANEL
ALT: 31 IU/L (ref 0–44)
AST: 38 IU/L (ref 0–40)
Albumin: 4.4 g/dL (ref 3.6–4.8)
Alkaline Phosphatase: 79 IU/L (ref 39–117)
Bilirubin Total: 0.3 mg/dL (ref 0.0–1.2)
Bilirubin, Direct: 0.1 mg/dL (ref 0.00–0.40)
Total Protein: 6.5 g/dL (ref 6.0–8.5)

## 2017-10-25 LAB — LIPID PANEL
Chol/HDL Ratio: 4.1 ratio (ref 0.0–5.0)
Cholesterol, Total: 146 mg/dL (ref 100–199)
HDL: 36 mg/dL — ABNORMAL LOW (ref >39)
LDL CALC: 47 mg/dL (ref 0–99)
Triglycerides: 314 mg/dL — ABNORMAL HIGH (ref 0–149)
VLDL CHOLESTEROL CAL: 63 mg/dL — AB (ref 5–40)

## 2017-10-25 LAB — PRO B NATRIURETIC PEPTIDE: NT-PRO BNP: 302 pg/mL — AB (ref 0–210)

## 2017-10-25 NOTE — Patient Instructions (Signed)
Medication Instructions:  1. START PEPCID 20 MG TWICE DAILY; DO THIS FOR 2 WEEKS THEN TAKE AS NEEDED  Labwork: 1. TODAY BMET, LIPIDS, LFT, PRO BNP  Testing/Procedures: 1. Your physician has requested that you have an echocardiogram. Echocardiography is a painless test that uses sound waves to create images of your heart. It provides your doctor with information about the size and shape of your heart and how well your heart's chambers and valves are working. This procedure takes approximately one hour. There are no restrictions for this procedure.  2. Your physician has requested that you have a lexiscan myoview. For further information please visit [email or web address]. Please follow instruction sheet, as given.    Follow-Up: Lowe's CompaniesSCOTT WEAVER, PAC IN 4-6 WEEKS   Any Other Special Instructions Will Be Listed Below (If Applicable).     If you need a refill on your cardiac medications before your next appointment, please call your pharmacy.

## 2017-10-26 ENCOUNTER — Telehealth: Payer: Self-pay | Admitting: *Deleted

## 2017-10-26 ENCOUNTER — Telehealth (HOSPITAL_COMMUNITY): Payer: Self-pay | Admitting: *Deleted

## 2017-10-26 NOTE — Telephone Encounter (Signed)
-----   Message from Beatrice LecherScott T Weaver, [place] JerseyPA-C sent at 10/25/2017 10:11 PM EDT ----- Renal function, potassium, LFTs normal.  The BNP is not significantly elevated.  The LDL is at goal.  Triglycerides are elevated. Continue current medications.  Continue to work on diet to control Triglycerides. Tereso NewcomerScott Weaver, PA-C    10/25/2017 10:08 PM

## 2017-10-26 NOTE — Telephone Encounter (Signed)
Left message on voicemail per DPR in reference to upcoming appointment scheduled on 10/29/17 with detailed instructions given per Myocardial Perfusion Study Information Sheet for the test. LM to arrive 15 minutes early, and that it is imperative to arrive on time for appointment to keep from having the test rescheduled. If you need to cancel or reschedule your appointment, please call the office within 24 hours of your appointment. Failure to do so may result in a cancellation of your appointment, and a $50 no show fee. Phone number given for call back for any questions. Ricky AlaSmith, Fergie Sherbert Jacqueline, RN

## 2017-10-26 NOTE — Telephone Encounter (Signed)
Left message to go over lab results.  

## 2017-10-29 ENCOUNTER — Ambulatory Visit (HOSPITAL_BASED_OUTPATIENT_CLINIC_OR_DEPARTMENT_OTHER): Payer: BLUE CROSS/BLUE SHIELD

## 2017-10-29 ENCOUNTER — Ambulatory Visit (HOSPITAL_COMMUNITY): Payer: BLUE CROSS/BLUE SHIELD | Attending: Physician Assistant

## 2017-10-29 ENCOUNTER — Other Ambulatory Visit: Payer: Self-pay

## 2017-10-29 ENCOUNTER — Encounter: Payer: Self-pay | Admitting: Physician Assistant

## 2017-10-29 DIAGNOSIS — R079 Chest pain, unspecified: Secondary | ICD-10-CM

## 2017-10-29 DIAGNOSIS — I088 Other rheumatic multiple valve diseases: Secondary | ICD-10-CM | POA: Insufficient documentation

## 2017-10-29 DIAGNOSIS — I2510 Atherosclerotic heart disease of native coronary artery without angina pectoris: Secondary | ICD-10-CM | POA: Diagnosis not present

## 2017-10-29 DIAGNOSIS — I10 Essential (primary) hypertension: Secondary | ICD-10-CM | POA: Insufficient documentation

## 2017-10-29 DIAGNOSIS — I4891 Unspecified atrial fibrillation: Secondary | ICD-10-CM | POA: Insufficient documentation

## 2017-10-29 DIAGNOSIS — E119 Type 2 diabetes mellitus without complications: Secondary | ICD-10-CM | POA: Diagnosis not present

## 2017-10-29 DIAGNOSIS — E785 Hyperlipidemia, unspecified: Secondary | ICD-10-CM | POA: Diagnosis not present

## 2017-10-29 LAB — ECHOCARDIOGRAM COMPLETE
Height: 70 in
Weight: 3936 oz

## 2017-10-29 LAB — MYOCARDIAL PERFUSION IMAGING
CHL CUP NUCLEAR SRS: 6
CHL CUP NUCLEAR SSS: 8
LV sys vol: 66 mL
LVDIAVOL: 133 mL (ref 62–150)
NUC STRESS TID: 1.09
Peak HR: 65 {beats}/min
RATE: 0.41
Rest HR: 58 {beats}/min
SDS: 2

## 2017-10-29 MED ORDER — REGADENOSON 0.4 MG/5ML IV SOLN
0.4000 mg | Freq: Once | INTRAVENOUS | Status: AC
  Administered 2017-10-29: 0.4 mg via INTRAVENOUS

## 2017-10-29 MED ORDER — TECHNETIUM TC 99M TETROFOSMIN IV KIT
10.2000 | PACK | Freq: Once | INTRAVENOUS | Status: AC | PRN
  Administered 2017-10-29: 10.2 via INTRAVENOUS
  Filled 2017-10-29: qty 11

## 2017-10-29 MED ORDER — PERFLUTREN LIPID MICROSPHERE
1.0000 mL | INTRAVENOUS | Status: AC | PRN
  Administered 2017-10-29: 3 mL via INTRAVENOUS

## 2017-10-29 MED ORDER — TECHNETIUM TC 99M TETROFOSMIN IV KIT
32.6000 | PACK | Freq: Once | INTRAVENOUS | Status: AC | PRN
  Administered 2017-10-29: 32.6 via INTRAVENOUS
  Filled 2017-10-29: qty 33

## 2017-11-08 NOTE — Telephone Encounter (Signed)
Dr. Kirke CorinArida reviewed [place] AB[place]  He thinks he should see Mr. Rocco SereneBistyga to evaluate him for PAD. Please refer to Dr. Kirke CorinArida for PV. Dr. Kirke CorinArida did send a message to his RN.  Please check with her first to determine scheduling. Tereso NewcomerScott Nyhla Mountjoy, PA-C    11/08/2017 10:35 PM

## 2017-11-09 ENCOUNTER — Telehealth: Payer: Self-pay | Admitting: *Deleted

## 2017-11-09 NOTE — Telephone Encounter (Signed)
Left a message to call back to set up a consult appointment with Dr. Kirke CorinArida.

## 2017-11-09 NOTE — Telephone Encounter (Signed)
-----   Message from Iran OuchMuhammad A Arida, MD sent at 11/08/2017  1:38 PM EDT ----- Lorin PicketScott, There seems to be conflicting data between the waveforms and the duplex findings.  He might ultimately require angiography if the ulceration does not heal and thus I recommend that he gets evaluated by me in few weeks.  I forwarded to LincolnLisa.

## 2017-11-09 NOTE — Telephone Encounter (Signed)
Left a message to call back.

## 2017-11-09 NOTE — Telephone Encounter (Signed)
Pt has been scheduled to see Dr. Kirke CorinArida 11/23/17 @ 3 pm for PV consult.

## 2017-11-09 NOTE — Telephone Encounter (Signed)
Patient returning call.  Scheduled With Dr. Kirke CorinArida 08-20 at 3 pm.

## 2017-11-11 NOTE — Telephone Encounter (Signed)
Patient's appointment has been moved to the [facility] location on 11/16/17 per the patient request.

## 2017-11-16 ENCOUNTER — Encounter: Payer: Self-pay | Admitting: Cardiovascular Disease

## 2017-11-16 ENCOUNTER — Ambulatory Visit: Payer: BLUE CROSS/BLUE SHIELD | Admitting: Cardiovascular Disease

## 2017-11-16 VITALS — BP 132/72 | HR 65 | Ht 70.0 in | Wt 234.4 lb

## 2017-11-16 DIAGNOSIS — E11621 Type 2 diabetes mellitus with foot ulcer: Secondary | ICD-10-CM | POA: Diagnosis not present

## 2017-11-16 DIAGNOSIS — I2510 Atherosclerotic heart disease of native coronary artery without angina pectoris: Secondary | ICD-10-CM | POA: Diagnosis not present

## 2017-11-16 DIAGNOSIS — L97521 Non-pressure chronic ulcer of other part of left foot limited to breakdown of skin: Secondary | ICD-10-CM

## 2017-11-16 DIAGNOSIS — I10 Essential (primary) hypertension: Secondary | ICD-10-CM

## 2017-11-16 DIAGNOSIS — E7800 Pure hypercholesterolemia, unspecified: Secondary | ICD-10-CM

## 2017-11-16 NOTE — Progress Notes (Signed)
Cardiology Office Note   Date:  11/16/2017   ID:  Margaretmary Eddyobert W. Bellerose, DOB 09/08/1950, MRN [id number]  PCP:  Kari BaarsHawkins, Edward, MD  Cardiologist: Dr. Shirlee LatchMclean Tereso Newcomer/Scott Weaver   Chief Complaint  Patient presents with  . Follow-up    pt denied chest pain and SOB      History of Present Illness: Margaretmary EddyRobert W. Witz is a 67 y.o. male who was referred by Tereso NewcomerScott Weaver for evaluation and management of peripheral arterial disease.  He has known history of coronary artery disease status post CABG in 2009, hypertension, hyperlipidemia and type 2 diabetes. He reports recent small ulceration at the bottom of the left small toe.  It started as a blister.  He went [place] wound center in [place] and this has healed quickly completely without any issues.  He did undergo vascular studies there.  ABI was normal bilaterally.  Duplex showed mild diffuse disease with no critical lesion.  However, he was noted to have monophasic waveforms in the tibial vessels suggestive of runoff disease.  The patient currently with no claudication. He reports having ulceration on his feet 3 times in the past but they healed quickly with wound care.    Past Medical History:  Diagnosis Date  . CAD (coronary artery disease), native coronary artery    Multiple PCI procedures followed by CABG in 2009 with LIMA-LAD, SVG-D, seq SVG-OM and PLV, SVG-PDA. Echo (3/12): EF 60-65%, mild MR. // Nuc Stress Test 7/19:  EF 50, artifact, no ischemia, low risk   . Carotid stenosis    dopplers 1/14:  0-39% bilat  . Cataract    REMOVED  . Colon polyps 2006   Colonoscopy-Dr. Patterson(Tubular Adenoma)   . DM2 (diabetes mellitus, type 2) (HCC)    Followed by Dr. Chestine Sporelark  . History of echocardiogram    Echo 7/19: Mild LVH, EF 55-60, normal diastolic function, trivial AI, moderate LAE  . HLD (hyperlipidemia)   . HTN (hypertension)   . Hx of cardiovascular stress test    ETT-Myoview (03/2013):  Anteroseptal reversible defect, EF 54%,  intermediate risk.  [name]. Postoperative atrial fibrillation (HCC) 2009  . Ulcer    left foot    Past Surgical History:  Procedure Laterality Date  . APPENDECTOMY  2005  . BACK SURGERY  2006  . CARDIAC CATHETERIZATION  2009   EF 60%  . COLONOSCOPY    . CORONARY ARTERY BYPASS GRAFT  2009   post op. a fib, LIMA -LAD, SVG-D,seq SVG-OM and PLV,  SVG-PDA.  . TONSILLECTOMY       Current Outpatient Medications  Medication Sig Dispense Refill  . amitriptyline (ELAVIL) 75 MG tablet Take 75 mg by mouth at bedtime.    [name]. amLODipine (NORVASC) 10 MG tablet Take 10 mg by mouth daily.     [name]. aspirin EC 81 MG tablet Take 1 tablet (81 mg total) by mouth daily.    . clopidogrel (PLAVIX) 75 MG tablet Take 1 tablet (75 mg total) by mouth daily. 30 tablet 7  . ezetimibe (ZETIA) 10 MG tablet Take 1 tablet (10 mg total) by mouth daily. 90 tablet 2  . fenofibrate 54 MG tablet TAKE 1 TABLET (54 MG TOTAL) BY MOUTH DAILY. 90 tablet 2  . furosemide (LASIX) 40 MG tablet Take 1 tablet (40 mg total) by mouth daily. 90 tablet 3  . metFORMIN (GLUCOPHAGE) 1000 MG tablet Take 1,000 mg by mouth 2 (two) times daily with a meal.    . metoprolol tartrate (  LOPRESSOR) 100 MG tablet Take 1 tablet (100 mg total) by mouth 2 (two) times daily. 180 tablet 2  . nitroGLYCERIN (NITROSTAT) 0.4 MG SL tablet PLACE 1 TABLET (0.4 MG TOTAL) UNDER THE TONGUE EVERY 5 (FIVE) MINUTES AS NEEDED FOR CHEST PAIN. 25 tablet 5  . NOVOFINE 30G X 8 MM MISC Inject 1 packet into the skin as needed (BLOOD SUGAR).     [name] NOVOLOG MIX 70/30 FLEXPEN (70-30) 100 UNIT/ML Pen Inject 40-60 Units into the skin 2 (two) times daily.     . rosuvastatin (CRESTOR) 5 MG tablet Take 1 tablet (5 mg total) by mouth daily. 90 tablet 2  . VICTOZA 18 MG/3ML SOPN INJECT 0.6 MG UNDER THE SKIN DAILY X 1 WK, 1.2 MG DAILY X 1 WK THEN 1.8 MG DAILY FOR 1 WK  3   No current facility-administered medications for this visit.     Allergies:   Penicillins    Social History:  The  patient  reports that he has never smoked. He quit smokeless tobacco use about 24 years ago.  His smokeless tobacco use included chew. He reports that he does not drink alcohol or use drugs.   Family History:  The patient's family history includes Colon cancer in his mother; Coronary artery disease in his unknown relative; Diabetes in his brother and father; Heart disease in his unknown relative; Other in his brother and brother; Prostate cancer in his brother and father.    ROS:  Please see the history of present illness.   Otherwise, review of systems are positive for none.   All other systems are reviewed and negative.    PHYSICAL EXAM: VS:  BP 132/72   Pulse 65   Ht 5\' 10"  (1.778 m)   Wt 234 lb 6.4 oz (106.3 kg)   BMI 33.63 kg/m  , BMI Body mass index is 33.63 kg/m. GEN: Well nourished, well developed, in no acute distress  HEENT: normal  Neck: no JVD, carotid bruits, or masses Cardiac: RRR; no murmurs, rubs, or gallops,no edema  Respiratory:  clear to auscultation bilaterally, normal work of breathing GI: soft, nontender, nondistended, + BS MS: no deformity or atrophy  Skin: warm and dry, no rash Neuro:  Strength and sensation are intact Psych: euthymic mood, full affect Vascular: Femoral pulses normal bilaterally.  Dorsalis pedis is +2 bilaterally.  Posterior tibial is not palpable.   EKG:  EKG is not ordered today.   Recent Labs: 10/25/2017: ALT 31; BUN 14; Creatinine, Ser 1.19; NT-Pro BNP 302; Potassium 4.1; Sodium 141    Lipid Panel    Component Value Date/Time   CHOL 146 10/25/2017 0944   TRIG 314 (H) 10/25/2017 0944   HDL 36 (L) 10/25/2017 0944   CHOLHDL 4.1 10/25/2017 0944   CHOLHDL 4 11/26/2014 1010   VLDL 46.6 (H) 11/26/2014 1010   LDLCALC 47 10/25/2017 0944   LDLDIRECT 78.0 11/26/2014 1010      Wt Readings from Last 3 Encounters:  11/16/17 234 lb 6.4 oz (106.3 kg)  10/29/17 246 lb (111.6 kg)  10/25/17 246 lb (111.6 kg)      No flowsheet data  found.    ASSESSMENT AND PLAN:  1.  Recent diabetic foot ulcer: It healed quickly with wound care and currently he has no ulceration.  By physical exam, he does not appear to have significant peripheral arterial disease given normal femoral pulses as well as dorsalis pedis bilaterally.  I reviewed his noninvasive vascular evaluation.  His ABI was  normal and duplex showed no obstructive disease in the major vessels.  However, he was noted to have monophasic waveforms in the tibial vessels suggestive of runoff disease.  It is possible that he might have small vessel disease based on this.  At the present time, he has no claudication and ulceration has healed.  Thus, there is no indication for angiography. I do not think his PAD is severe enough to be interfering with the healing process. I advised him to continue with proper foot hygiene.  He can follow-up with me as needed if he develops claudication or nonhealing ulceration. He is otherwise on good medical therapy.  2.  Coronary artery disease involving native coronary arteries without angina: Continue medical therapy.  3.  Essential hypertension: Blood pressure is controlled on current medications  4.  Hyperlipidemia: Currently on rosuvastatin, Zetia and fenofibrate.    Disposition:   FU with me as needed  Signed,  Lorine BearsMuhammad Ajani Schnieders, MD  11/16/2017 11:15 AM    [facility]

## 2017-11-16 NOTE — Patient Instructions (Signed)
Medication Instructions: Your physician recommends that you continue on your current medications as directed. Please refer to the Current Medication list given to you today.  If you need a refill on your cardiac medications before your next appointment, please call your pharmacy.    Follow-Up: Your physician wants you to follow-up as needed with Dr. Arida.   Thank you for choosing [facility]!!      

## 2017-11-18 ENCOUNTER — Encounter: Payer: Self-pay | Admitting: Physician Assistant

## 2017-11-23 ENCOUNTER — Ambulatory Visit: Payer: BLUE CROSS/BLUE SHIELD | Admitting: Cardiovascular Disease

## 2017-11-26 ENCOUNTER — Telehealth: Payer: Self-pay | Admitting: Physician Assistant

## 2017-11-26 NOTE — Telephone Encounter (Signed)
I s/w pt in regards to call about swelling in legs. Pt states swelling is more in the calf area. He states he has been helping a friend of his who is [employer] and so he has been on his feet all day everyday. He states swelling does go down at night time so when he wakes up in the morning he says he has "chicken legs". Pt denies any weight increase though he does not check his weight at home, but denies that his pants are any tighter or that he is sob. Pt asked he is not sure should he take any extra lasix or not. Pt also asked should he maybe wear compression socks. I told pt I thought it would be ok if he got some OTC compression socks. Advised him to get the lightest pressure measurement to start with and try to wear them from the time he wakes up until bed. Advised him to keep his appt with Tereso NewcomerScott Weaver, PA 12/10/17. I did tell the pt I would have a Triage nurse call him just to make sure he is ok. Pt thanked me for my help.

## 2017-11-26 NOTE — Telephone Encounter (Signed)
New Message         Pt c/o swelling: STAT is pt has developed SOB within 24 hours  1) How much weight have you gained and in what time span?   2) If swelling, where is the swelling located? Legs  3) Are you currently taking a fluid pill? Yes  4) Are you currently SOB? No   5) Do you have a log of your daily weights (if so, list)? No   6) Have you gained 3 pounds in a day or 5 pounds in a week? Not sure  7) Have you traveled recently? No           Patient would like to know more of the  Lasix? Pls advise

## 2017-11-26 NOTE — Telephone Encounter (Signed)
Spoke to patient about his conversation with Okey RegalCarol regarding his swollen calves/ankles, patient is on his feet all day, working presently outdoors   I told him that she has good recommendations and that I would start with the compression stockings and keep his legs elevated in the evening.    I told him to update us into next week if there are not any changes.  We may need to look into his medications.  He verbalized understanding and thanked us for the call.

## 2017-12-07 ENCOUNTER — Ambulatory Visit: Payer: BLUE CROSS/BLUE SHIELD | Admitting: Physician Assistant

## 2017-12-10 ENCOUNTER — Encounter: Payer: Self-pay | Admitting: Physician Assistant

## 2017-12-10 ENCOUNTER — Encounter (INDEPENDENT_AMBULATORY_CARE_PROVIDER_SITE_OTHER): Payer: Self-pay

## 2017-12-10 ENCOUNTER — Ambulatory Visit: Payer: BLUE CROSS/BLUE SHIELD | Admitting: Physician Assistant

## 2017-12-10 VITALS — BP 140/76 | HR 75 | Ht 70.0 in | Wt 237.4 lb

## 2017-12-10 DIAGNOSIS — I10 Essential (primary) hypertension: Secondary | ICD-10-CM | POA: Diagnosis not present

## 2017-12-10 DIAGNOSIS — I739 Peripheral vascular disease, unspecified: Secondary | ICD-10-CM

## 2017-12-10 DIAGNOSIS — I2510 Atherosclerotic heart disease of native coronary artery without angina pectoris: Secondary | ICD-10-CM

## 2017-12-10 DIAGNOSIS — E7800 Pure hypercholesterolemia, unspecified: Secondary | ICD-10-CM

## 2017-12-10 HISTORY — DX: Peripheral vascular disease, unspecified: I73.9

## 2017-12-10 MED ORDER — EZETIMIBE 10 MG PO TABS: 10.0000 mg | ORAL_TABLET | Freq: Every day | ORAL | 3 refills | Status: DC

## 2017-12-10 MED ORDER — AMLODIPINE BESYLATE 10 MG PO TABS
10.0000 mg | ORAL_TABLET | Freq: Every day | ORAL | 3 refills | Status: DC
Start: 2017-12-10 — End: 2018-12-28

## 2017-12-10 MED ORDER — FUROSEMIDE 40 MG PO TABS: 40.0000 mg | ORAL_TABLET | Freq: Every day | ORAL | 3 refills | Status: DC

## 2017-12-10 MED ORDER — CLOPIDOGREL BISULFATE 75 MG PO TABS
75.0000 mg | ORAL_TABLET | Freq: Every day | ORAL | 3 refills | Status: DC
Start: 2017-12-10 — End: 2018-12-28

## 2017-12-10 MED ORDER — FENOFIBRATE 54 MG PO TABS: 54.0000 mg | ORAL_TABLET | Freq: Every day | ORAL | 3 refills | Status: DC

## 2017-12-10 MED ORDER — METOPROLOL TARTRATE 100 MG PO TABS
100.0000 mg | ORAL_TABLET | Freq: Two times a day (BID) | ORAL | 3 refills | Status: DC
Start: 2017-12-10 — End: 2019-01-18

## 2017-12-10 MED ORDER — ROSUVASTATIN CALCIUM 5 MG PO TABS: 5.0000 mg | ORAL_TABLET | Freq: Every day | ORAL | 3 refills | Status: DC

## 2017-12-10 MED ORDER — NITROGLYCERIN 0.4 MG SL SUBL: 0.4000 mg | SUBLINGUAL_TABLET | SUBLINGUAL | 5 refills | Status: DC | PRN

## 2017-12-10 NOTE — Patient Instructions (Signed)
Medication Instructions:  1. REFILLS HAVE BEEN SENT IN FOR YOUR CARDIAC MEDICATIONS  Labwork: NONE ORDERED TODAY  Testing/Procedures: NONE ORDERED TODAY  Follow-Up: 6 MONTHS WITH SCOTT WEAVER, PAC   Any Other Special Instructions Will Be Listed Below (If Applicable).     If you need a refill on your cardiac medications before your next appointment, please call your pharmacy.

## 2017-12-10 NOTE — Progress Notes (Signed)
Cardiology Office Note:    Date:  12/10/2017   ID:  [name], DOB [birth year], MRN [id number]  PCP:  Kari Baars, MD  Cardiologist:  Tonny Bollman, MD / Tereso Newcomer, PA-C   Referring MD: Kari Baars, MD   Chief Complaint  Patient presents with  . Follow-up    CAD    History of Present Illness:    [name] is a 67 y.o. male with coronary artery diseases/pcoronary artery bypass graftingin 2009,hypertension,hyperlipidemia,diabetes. He was last seen in July 2019 for chest pain.  Of note, he had been treated for a diabetic foot ulcer in [place], [place] in June 2019.  ABIs were abnormal.  I also referred him to Dr. Kirke Corin for PV evaluation.   Nuclear stress test was low risk and negative for ischemia.  An echocardiogram demonstrated normal LV function.  He saw Dr. Kirke Corin in August 2019.  It was felt that he likely had small vessel disease.  The patient was not having any symptoms of claudication.  No further evaluation was recommended.    [name] returns for follow-up.  He is here alone.  Since last seen, he has had one other episode of chest discomfort.  This cleared with belching.  He denies exertional symptoms.  He gets short of breath with more extreme activities.  He is excited about going hunting soon.  He did have some leg swelling.  He thinks that this may be related to a high salt load in his diet.  He denies orthopnea, PND, syncope.  Prior CV studies:   The following studies were reviewed today:  Echocardiogram 10/29/2017 Mild LVH, EF 55-60, normal diastolic function, trivial AI, moderate LAE  Nuclear stress test 10/29/2017 EF 50, fixed septal/anteroseptal, lateral/inferolateral defect suggesting artifact, no ischemia, low risk  ETT-Myoview (03/14/13):   Ex 9:00, ECG with 1 mm ST depression inferiorly, primarily reversible medium-sized anteroseptal defect, EF 54% (intermediate risk).    Carotid US (04/2012):   Mild plaque bilaterally (0-39%) =>  repeat in 1 year.     Past Medical History:  Diagnosis Date  . CAD (coronary artery disease), native coronary artery    Multiple PCI procedures followed by CABG in 2009 with LIMA-LAD, SVG-D, seq SVG-OM and PLV, SVG-PDA. Echo (3/12): EF 60-65%, mild MR. // Nuc Stress Test 7/19:  EF 50, artifact, no ischemia, low risk   . Carotid stenosis    dopplers 1/14:  0-39% bilat  . Cataract    REMOVED  . Colon polyps 2006   Colonoscopy-Dr. Patterson(Tubular Adenoma)   . DM2 (diabetes mellitus, type 2) (HCC)    Followed by Dr. Chestine Spore  . History of echocardiogram    Echo 7/19: Mild LVH, EF 55-60, normal diastolic function, trivial AI, moderate LAE  . HLD (hyperlipidemia)   . HTN (hypertension)   . Hx of cardiovascular stress test    ETT-Myoview (03/2013):  Anteroseptal reversible defect, EF 54%, intermediate risk.  [name] Postoperative atrial fibrillation (HCC) 2009  . Ulcer    left foot  1. CAD: Multiple PCI procedures followed by CABG in 2009 with LIMA-LAD, SVG-D, seq SVG-OM and PLV, SVG-PDA.  Echo (3/12): EF 60-65%, mild MR.  ETT-Cardiolite in 12/14 with a reversible basal to mid anteroseptal defect with EF 54%.   2. Insulin-dependent diabetes: Followed by Dr. Chestine Spore.  Has diabetic foot ulcer.   3. Hypertension.   4. Hyperlipidemia.   5. Postoperative atrial fibrillation after CABG in 2009   6. Carotid dopplers (1/14) with 0-39% bilateral ICA  stenosis.  Surgical Hx: The patient  has a past surgical history that includes Coronary artery bypass graft (2009); Cardiac catheterization (2009); Back surgery (2006); Appendectomy (2005); Tonsillectomy; and Colonoscopy.   Current Medications: Current Meds  Medication Sig  . amitriptyline (ELAVIL) 75 MG tablet Take 75 mg by mouth at bedtime.  [name] amLODipine (NORVASC) 10 MG tablet Take 1 tablet (10 mg total) by mouth daily.  [name] aspirin EC 81 MG tablet Take 1 tablet (81 mg total) by mouth daily.  . clopidogrel (PLAVIX) 75 MG tablet Take 1 tablet (75 mg total)  by mouth daily.  [name] ezetimibe (ZETIA) 10 MG tablet Take 1 tablet (10 mg total) by mouth daily.  . fenofibrate 54 MG tablet Take 1 tablet (54 mg total) by mouth daily.  . furosemide (LASIX) 40 MG tablet Take 1 tablet (40 mg total) by mouth daily.  . metFORMIN (GLUCOPHAGE) 1000 MG tablet Take 1,000 mg by mouth 2 (two) times daily with a meal.  . metoprolol tartrate (LOPRESSOR) 100 MG tablet Take 1 tablet (100 mg total) by mouth 2 (two) times daily.  . nitroGLYCERIN (NITROSTAT) 0.4 MG SL tablet Place 1 tablet (0.4 mg total) under the tongue every 5 (five) minutes as needed for chest pain.  [name] NOVOFINE 30G X 8 MM MISC Inject 1 packet into the skin as needed (BLOOD SUGAR).   [name] NOVOLOG MIX 70/30 FLEXPEN (70-30) 100 UNIT/ML Pen Inject 40-60 Units into the skin 2 (two) times daily.   . rosuvastatin (CRESTOR) 5 MG tablet Take 1 tablet (5 mg total) by mouth daily.  [name] VICTOZA 18 MG/3ML SOPN INJECT 0.6 MG UNDER THE SKIN DAILY X 1 WK, 1.2 MG DAILY X 1 WK THEN 1.8 MG DAILY FOR 1 WK  . [DISCONTINUED] amLODipine (NORVASC) 10 MG tablet Take 10 mg by mouth daily.   . [DISCONTINUED] clopidogrel (PLAVIX) 75 MG tablet Take 1 tablet (75 mg total) by mouth daily.  . [DISCONTINUED] ezetimibe (ZETIA) 10 MG tablet Take 1 tablet (10 mg total) by mouth daily.  . [DISCONTINUED] fenofibrate 54 MG tablet TAKE 1 TABLET (54 MG TOTAL) BY MOUTH DAILY.  . [DISCONTINUED] furosemide (LASIX) 40 MG tablet Take 1 tablet (40 mg total) by mouth daily.  . [DISCONTINUED] metoprolol tartrate (LOPRESSOR) 100 MG tablet Take 1 tablet (100 mg total) by mouth 2 (two) times daily.  . [DISCONTINUED] nitroGLYCERIN (NITROSTAT) 0.4 MG SL tablet PLACE 1 TABLET (0.4 MG TOTAL) UNDER THE TONGUE EVERY 5 (FIVE) MINUTES AS NEEDED FOR CHEST PAIN.  . [DISCONTINUED] rosuvastatin (CRESTOR) 5 MG tablet Take 1 tablet (5 mg total) by mouth daily.     Allergies:   Penicillins   Social History   Tobacco Use  . Smoking status: Never Smoker  . Smokeless tobacco:  Former Neurosurgeon    Types: Chew  Substance Use Topics  . Alcohol use: No  . Drug use: No     Family Hx: The patient's family history includes Colon cancer in his mother; Coronary artery disease in his unknown relative; Diabetes in his brother and father; Heart disease in his unknown relative; Other in his brother and brother; Prostate cancer in his brother and father.  ROS:   Please see the history of present illness.    Review of Systems  Cardiovascular: Positive for leg swelling.  Hematologic/Lymphatic: Positive for bleeding problem. Bruises/bleeds easily.   All other systems reviewed and are negative.   EKGs/Labs/Other Test Reviewed:    EKG:  EKG is not ordered today.  Recent Labs: 10/25/2017: ALT 31; BUN 14; Creatinine, Ser 1.19; NT-Pro BNP 302; Potassium 4.1; Sodium 141   Recent Lipid Panel Lab Results  Component Value Date/Time   CHOL 146 10/25/2017 09:44 AM   TRIG 314 (H) 10/25/2017 09:44 AM   HDL 36 (L) 10/25/2017 09:44 AM   CHOLHDL 4.1 10/25/2017 09:44 AM   CHOLHDL 4 11/26/2014 10:10 AM   LDLCALC 47 10/25/2017 09:44 AM   LDLDIRECT 78.0 11/26/2014 10:10 AM    Physical Exam:    VS:  BP 140/76   Pulse 75   Ht 5\' 10"  (1.778 m)   Wt 237 lb 6.4 oz (107.7 kg)   SpO2 95%   BMI 34.06 kg/m     Wt Readings from Last 3 Encounters:  12/10/17 237 lb 6.4 oz (107.7 kg)  11/16/17 234 lb 6.4 oz (106.3 kg)  10/29/17 246 lb (111.6 kg)     Physical Exam  Constitutional: He is oriented to person, place, and time. He appears well-developed and well-nourished. No distress.  HENT:  Head: Normocephalic and atraumatic.  Eyes: No scleral icterus.  Neck: No thyromegaly present.  Cardiovascular: Normal rate and regular rhythm.  No murmur heard. Pulmonary/Chest: Effort normal. He has no wheezes. He has no rales.  Abdominal: Soft. There is no tenderness.  Musculoskeletal: He exhibits edema (trace bilat LE edema).  Lymphadenopathy:    He has no cervical adenopathy.    Neurological: He is alert and oriented to person, place, and time.  Skin: Skin is warm and dry.  Psychiatric: He has a normal mood and affect.    ASSESSMENT & PLAN:    Coronary artery disease involving native coronary artery of native heart without angina pectoris History of CABG in 2009.  Recent nuclear stress test low risk.  He has had some more chest discomfort.  However, this seems to be more GI related than angina.  No further testing is warranted.  Continue current therapy which includes aspirin, clopidogrel, rosuvastatin, ezetimibe, metoprolol.  PAD (peripheral artery disease) (HCC) As noted, he was evaluated by Dr. Kirke Corin.  No further evaluation is needed.  He denies claudication symptoms.  Essential hypertension Blood pressure above target today.  He has generally been at goal.  He does admit to eating a diet high in salt at times.  We discussed maintaining a low-salt diet.  Continue to monitor for now.  Continue current dose of amlodipine, furosemide, metoprolol.  Pure hypercholesterolemia Recent LDL optimal.  Continue current dose of rosuvastatin, ezetimibe, fenofibrate.   Dispo:  Return in about 6 months (around 06/10/2018) for Routine Follow Up, w/ Tereso Newcomer, PA-C.   Medication Adjustments/Labs and Tests Ordered: Current medicines are reviewed at length with the patient today.  Concerns regarding medicines are outlined above.  Tests Ordered: No orders of the defined types were placed in this encounter.  Medication Changes: Meds ordered this encounter  Medications  . amLODipine (NORVASC) 10 MG tablet    Sig: Take 1 tablet (10 mg total) by mouth daily.    Dispense:  90 tablet    Refill:  3  . clopidogrel (PLAVIX) 75 MG tablet    Sig: Take 1 tablet (75 mg total) by mouth daily.    Dispense:  90 tablet    Refill:  3  . ezetimibe (ZETIA) 10 MG tablet    Sig: Take 1 tablet (10 mg total) by mouth daily.    Dispense:  90 tablet    Refill:  3  . fenofibrate 54 MG  tablet  Sig: Take 1 tablet (54 mg total) by mouth daily.    Dispense:  90 tablet    Refill:  3  . furosemide (LASIX) 40 MG tablet    Sig: Take 1 tablet (40 mg total) by mouth daily.    Dispense:  90 tablet    Refill:  3  . metoprolol tartrate (LOPRESSOR) 100 MG tablet    Sig: Take 1 tablet (100 mg total) by mouth 2 (two) times daily.    Dispense:  180 tablet    Refill:  3  . nitroGLYCERIN (NITROSTAT) 0.4 MG SL tablet    Sig: Place 1 tablet (0.4 mg total) under the tongue every 5 (five) minutes as needed for chest pain.    Dispense:  25 tablet    Refill:  5  . rosuvastatin (CRESTOR) 5 MG tablet    Sig: Take 1 tablet (5 mg total) by mouth daily.    Dispense:  90 tablet    Refill:  3    Signed, Tereso Newcomer, PA-C  12/10/2017 10:04 AM    [facility] [address] [place], [place], [place]  [address] Phone: [phone]; Fax: [phone]

## 2018-03-09 DIAGNOSIS — E1165 Type 2 diabetes mellitus with hyperglycemia: Secondary | ICD-10-CM | POA: Diagnosis not present

## 2018-03-09 DIAGNOSIS — E1140 Type 2 diabetes mellitus with diabetic neuropathy, unspecified: Secondary | ICD-10-CM | POA: Diagnosis not present

## 2018-03-09 DIAGNOSIS — E7800 Pure hypercholesterolemia, unspecified: Secondary | ICD-10-CM | POA: Diagnosis not present

## 2018-03-09 DIAGNOSIS — I2510 Atherosclerotic heart disease of native coronary artery without angina pectoris: Secondary | ICD-10-CM | POA: Diagnosis not present

## 2018-03-09 DIAGNOSIS — I10 Essential (primary) hypertension: Secondary | ICD-10-CM | POA: Diagnosis not present

## 2018-03-29 ENCOUNTER — Other Ambulatory Visit: Payer: Self-pay | Admitting: Physician Assistant

## 2018-06-03 DIAGNOSIS — B351 Tinea unguium: Secondary | ICD-10-CM | POA: Diagnosis not present

## 2018-06-03 DIAGNOSIS — L851 Acquired keratosis [keratoderma] palmaris et plantaris: Secondary | ICD-10-CM | POA: Diagnosis not present

## 2018-06-03 DIAGNOSIS — E1142 Type 2 diabetes mellitus with diabetic polyneuropathy: Secondary | ICD-10-CM | POA: Diagnosis not present

## 2018-06-15 DIAGNOSIS — I2510 Atherosclerotic heart disease of native coronary artery without angina pectoris: Secondary | ICD-10-CM | POA: Diagnosis not present

## 2018-06-15 DIAGNOSIS — E1140 Type 2 diabetes mellitus with diabetic neuropathy, unspecified: Secondary | ICD-10-CM | POA: Diagnosis not present

## 2018-06-15 DIAGNOSIS — E7800 Pure hypercholesterolemia, unspecified: Secondary | ICD-10-CM | POA: Diagnosis not present

## 2018-06-15 DIAGNOSIS — I10 Essential (primary) hypertension: Secondary | ICD-10-CM | POA: Diagnosis not present

## 2018-06-15 DIAGNOSIS — E1165 Type 2 diabetes mellitus with hyperglycemia: Secondary | ICD-10-CM | POA: Diagnosis not present

## 2018-08-30 DIAGNOSIS — E1142 Type 2 diabetes mellitus with diabetic polyneuropathy: Secondary | ICD-10-CM | POA: Diagnosis not present

## 2018-09-16 DIAGNOSIS — R234 Changes in skin texture: Secondary | ICD-10-CM | POA: Diagnosis not present

## 2018-09-16 DIAGNOSIS — B351 Tinea unguium: Secondary | ICD-10-CM | POA: Diagnosis not present

## 2018-09-16 DIAGNOSIS — L851 Acquired keratosis [keratoderma] palmaris et plantaris: Secondary | ICD-10-CM | POA: Diagnosis not present

## 2018-09-16 DIAGNOSIS — E1142 Type 2 diabetes mellitus with diabetic polyneuropathy: Secondary | ICD-10-CM | POA: Diagnosis not present

## 2018-11-03 DIAGNOSIS — E7800 Pure hypercholesterolemia, unspecified: Secondary | ICD-10-CM | POA: Diagnosis not present

## 2018-11-03 DIAGNOSIS — I10 Essential (primary) hypertension: Secondary | ICD-10-CM | POA: Diagnosis not present

## 2018-11-03 DIAGNOSIS — E1165 Type 2 diabetes mellitus with hyperglycemia: Secondary | ICD-10-CM | POA: Diagnosis not present

## 2018-11-10 DIAGNOSIS — E1140 Type 2 diabetes mellitus with diabetic neuropathy, unspecified: Secondary | ICD-10-CM | POA: Diagnosis not present

## 2018-11-10 DIAGNOSIS — E1165 Type 2 diabetes mellitus with hyperglycemia: Secondary | ICD-10-CM | POA: Diagnosis not present

## 2018-11-10 DIAGNOSIS — I2510 Atherosclerotic heart disease of native coronary artery without angina pectoris: Secondary | ICD-10-CM | POA: Diagnosis not present

## 2018-11-10 DIAGNOSIS — E7800 Pure hypercholesterolemia, unspecified: Secondary | ICD-10-CM | POA: Diagnosis not present

## 2018-11-10 DIAGNOSIS — I10 Essential (primary) hypertension: Secondary | ICD-10-CM | POA: Diagnosis not present

## 2018-11-17 ENCOUNTER — Telehealth: Payer: Self-pay

## 2018-11-17 NOTE — Telephone Encounter (Signed)
Attempted to call pt to schedule a follow up appt. No answer

## 2018-11-25 DIAGNOSIS — B351 Tinea unguium: Secondary | ICD-10-CM | POA: Diagnosis not present

## 2018-11-25 DIAGNOSIS — L851 Acquired keratosis [keratoderma] palmaris et plantaris: Secondary | ICD-10-CM | POA: Diagnosis not present

## 2018-11-25 DIAGNOSIS — S90129A Contusion of unspecified lesser toe(s) without damage to nail, initial encounter: Secondary | ICD-10-CM | POA: Diagnosis not present

## 2018-11-25 DIAGNOSIS — E1142 Type 2 diabetes mellitus with diabetic polyneuropathy: Secondary | ICD-10-CM | POA: Diagnosis not present

## 2018-12-02 DIAGNOSIS — S90129S Contusion of unspecified lesser toe(s) without damage to nail, sequela: Secondary | ICD-10-CM | POA: Diagnosis not present

## 2018-12-02 DIAGNOSIS — E1142 Type 2 diabetes mellitus with diabetic polyneuropathy: Secondary | ICD-10-CM | POA: Diagnosis not present

## 2018-12-28 ENCOUNTER — Other Ambulatory Visit: Payer: Self-pay | Admitting: Physician Assistant

## 2019-01-12 ENCOUNTER — Other Ambulatory Visit: Payer: Self-pay | Admitting: Physician Assistant

## 2019-01-17 ENCOUNTER — Other Ambulatory Visit: Payer: Self-pay | Admitting: Physician Assistant

## 2019-01-20 ENCOUNTER — Other Ambulatory Visit: Payer: Self-pay | Admitting: Physician Assistant

## 2019-01-30 ENCOUNTER — Other Ambulatory Visit: Payer: Self-pay

## 2019-01-30 ENCOUNTER — Encounter (HOSPITAL_COMMUNITY): Payer: Self-pay | Admitting: Emergency Medicine

## 2019-01-30 ENCOUNTER — Inpatient Hospital Stay (HOSPITAL_COMMUNITY)
Admission: EM | Admit: 2019-01-30 | Discharge: 2019-02-01 | DRG: 066 | Disposition: A | Discharge status: Home / Self Care | Payer: Medicare Other | Attending: Pulmonary Disease | Admitting: Pulmonary Disease

## 2019-01-30 ENCOUNTER — Emergency Department (HOSPITAL_COMMUNITY): Payer: Medicare Other

## 2019-01-30 DIAGNOSIS — Z951 Presence of aortocoronary bypass graft: Secondary | ICD-10-CM

## 2019-01-30 DIAGNOSIS — Z880 Allergy status to penicillin: Secondary | ICD-10-CM

## 2019-01-30 DIAGNOSIS — Z833 Family history of diabetes mellitus: Secondary | ICD-10-CM

## 2019-01-30 DIAGNOSIS — E669 Obesity, unspecified: Secondary | ICD-10-CM | POA: Diagnosis present

## 2019-01-30 DIAGNOSIS — Z87891 Personal history of nicotine dependence: Secondary | ICD-10-CM

## 2019-01-30 DIAGNOSIS — E118 Type 2 diabetes mellitus with unspecified complications: Secondary | ICD-10-CM | POA: Diagnosis not present

## 2019-01-30 DIAGNOSIS — Z23 Encounter for immunization: Secondary | ICD-10-CM | POA: Diagnosis not present

## 2019-01-30 DIAGNOSIS — Z794 Long term (current) use of insulin: Secondary | ICD-10-CM

## 2019-01-30 DIAGNOSIS — I4891 Unspecified atrial fibrillation: Secondary | ICD-10-CM | POA: Diagnosis present

## 2019-01-30 DIAGNOSIS — I2510 Atherosclerotic heart disease of native coronary artery without angina pectoris: Secondary | ICD-10-CM | POA: Diagnosis not present

## 2019-01-30 DIAGNOSIS — Z7902 Long term (current) use of antithrombotics/antiplatelets: Secondary | ICD-10-CM

## 2019-01-30 DIAGNOSIS — E785 Hyperlipidemia, unspecified: Secondary | ICD-10-CM | POA: Diagnosis present

## 2019-01-30 DIAGNOSIS — Z8673 Personal history of transient ischemic attack (TIA), and cerebral infarction without residual deficits: Secondary | ICD-10-CM | POA: Diagnosis present

## 2019-01-30 DIAGNOSIS — I639 Cerebral infarction, unspecified: Secondary | ICD-10-CM | POA: Diagnosis present

## 2019-01-30 DIAGNOSIS — Z20828 Contact with and (suspected) exposure to other viral communicable diseases: Secondary | ICD-10-CM | POA: Diagnosis present

## 2019-01-30 DIAGNOSIS — Z8249 Family history of ischemic heart disease and other diseases of the circulatory system: Secondary | ICD-10-CM

## 2019-01-30 DIAGNOSIS — I10 Essential (primary) hypertension: Secondary | ICD-10-CM | POA: Diagnosis present

## 2019-01-30 DIAGNOSIS — N189 Chronic kidney disease, unspecified: Secondary | ICD-10-CM

## 2019-01-30 DIAGNOSIS — Z800 Family history of malignant neoplasm of digestive organs: Secondary | ICD-10-CM

## 2019-01-30 DIAGNOSIS — I25119 Atherosclerotic heart disease of native coronary artery with unspecified angina pectoris: Secondary | ICD-10-CM | POA: Diagnosis present

## 2019-01-30 DIAGNOSIS — Z7982 Long term (current) use of aspirin: Secondary | ICD-10-CM

## 2019-01-30 DIAGNOSIS — R4020 Unspecified coma: Secondary | ICD-10-CM | POA: Diagnosis not present

## 2019-01-30 DIAGNOSIS — Z8042 Family history of malignant neoplasm of prostate: Secondary | ICD-10-CM

## 2019-01-30 DIAGNOSIS — Z8719 Personal history of other diseases of the digestive system: Secondary | ICD-10-CM

## 2019-01-30 DIAGNOSIS — R4182 Altered mental status, unspecified: Secondary | ICD-10-CM | POA: Diagnosis not present

## 2019-01-30 DIAGNOSIS — N289 Disorder of kidney and ureter, unspecified: Secondary | ICD-10-CM | POA: Diagnosis not present

## 2019-01-30 DIAGNOSIS — G4733 Obstructive sleep apnea (adult) (pediatric): Secondary | ICD-10-CM | POA: Diagnosis present

## 2019-01-30 DIAGNOSIS — Z79899 Other long term (current) drug therapy: Secondary | ICD-10-CM

## 2019-01-30 HISTORY — DX: Cerebral infarction, unspecified: I63.9

## 2019-01-30 LAB — COMPREHENSIVE METABOLIC PANEL
ALT: 29 U/L (ref 0–44)
AST: 29 U/L (ref 15–41)
Albumin: 4.3 g/dL (ref 3.5–5.0)
Alkaline Phosphatase: 75 U/L (ref 38–126)
Anion gap: 13 (ref 5–15)
BUN: 31 mg/dL — ABNORMAL HIGH (ref 8–23)
CO2: 24 mmol/L (ref 22–32)
Calcium: 9.5 mg/dL (ref 8.9–10.3)
Chloride: 96 mmol/L — ABNORMAL LOW (ref 98–111)
Creatinine, Ser: 1.85 mg/dL — ABNORMAL HIGH (ref 0.61–1.24)
GFR calc Af Amer: 43 mL/min — ABNORMAL LOW (ref >60)
GFR calc non Af Amer: 37 mL/min — ABNORMAL LOW (ref >60)
Glucose, Bld: 389 mg/dL — ABNORMAL HIGH (ref 70–99)
Potassium: 3.7 mmol/L (ref 3.5–5.1)
Sodium: 133 mmol/L — ABNORMAL LOW (ref 135–145)
Total Bilirubin: 0.7 mg/dL (ref 0.3–1.2)
Total Protein: 7.3 g/dL (ref 6.5–8.1)

## 2019-01-30 LAB — APTT: aPTT: 29 seconds (ref 24–36)

## 2019-01-30 LAB — DIFFERENTIAL
Abs Immature Granulocytes: 0.03 10*3/uL (ref 0.00–0.07)
Basophils Absolute: 0.1 10*3/uL (ref 0.0–0.1)
Basophils Relative: 1 %
Eosinophils Absolute: 0.3 10*3/uL (ref 0.0–0.5)
Eosinophils Relative: 4 %
Immature Granulocytes: 0 %
Lymphocytes Relative: 31 %
Lymphs Abs: 2.5 10*3/uL (ref 0.7–4.0)
Monocytes Absolute: 0.7 10*3/uL (ref 0.1–1.0)
Monocytes Relative: 8 %
Neutro Abs: 4.6 10*3/uL (ref 1.7–7.7)
Neutrophils Relative %: 56 %

## 2019-01-30 LAB — CBC
HCT: 49.3 % (ref 39.0–52.0)
Hemoglobin: 16.4 g/dL (ref 13.0–17.0)
MCH: 29.1 pg (ref 26.0–34.0)
MCHC: 33.3 g/dL (ref 30.0–36.0)
MCV: 87.4 fL (ref 80.0–100.0)
Platelets: 283 10*3/uL (ref 150–400)
RBC: 5.64 MIL/uL (ref 4.22–5.81)
RDW: 12.9 % (ref 11.5–15.5)
WBC: 8.1 10*3/uL (ref 4.0–10.5)
nRBC: 0 % (ref 0.0–0.2)

## 2019-01-30 LAB — PROTIME-INR
INR: 1 (ref 0.8–1.2)
Prothrombin Time: 12.9 seconds (ref 11.4–15.2)

## 2019-01-30 LAB — LIPASE, BLOOD: Lipase: 51 U/L (ref 11–51)

## 2019-01-30 LAB — ETHANOL: Alcohol, Ethyl (B): <10 mg/dL (ref <10)

## 2019-01-30 MED ORDER — ONDANSETRON HCL 4 MG/2ML IJ SOLN
4.0000 mg | Freq: Once | INTRAMUSCULAR | Status: DC
  Filled 2019-01-30: qty 2

## 2019-01-30 NOTE — ED Notes (Signed)
Patient transported to CT 

## 2019-01-30 NOTE — ED Triage Notes (Signed)
Pt c/o he woke Sunday morning with a headache and confusion. Pt states the confusion is better but still not right.

## 2019-01-30 NOTE — ED Provider Notes (Addendum)
Emergency Department Provider Note   I have reviewed the triage vital signs and the nursing notes.   HISTORY  Chief Complaint Altered Mental Status   HPI [name] is a 68 y.o. male with past medical history reviewed below presents to the emergency department with confusion over the past 2 days.  Patient states that he woke up 2 days ago with a right-sided headache which is unusual for him.  He describes the headache as feeling behind his right eye without vision change.  Denies any sudden onset, maximal intensity headache symptoms.  He states that when his head is still he does not have much pain but with movement he feels discomfort on that side.  He has not experienced fever.  He notes occasional episodes where he will get confused.  For example, the patient states that he was attempting to dial his phone number and was confused about which number was the #1.  He states that he was able to see the number but "I had to really focus" to figure out that what he saw was actually the #1.  He states he has had other occasional episodes like that.  No numbness or tingling.  No difficulty walking.  No speech disturbance.  No difficulty swallowing.   Past Medical History:  Diagnosis Date   CAD (coronary artery disease), native coronary artery    Multiple PCI procedures followed by CABG in 2009 with LIMA-LAD, SVG-D, seq SVG-OM and PLV, SVG-PDA. Echo (3/12): EF 60-65%, mild MR. // Nuc Stress Test 7/19:  EF 50, artifact, no ischemia, low risk    Carotid stenosis    dopplers 1/14:  0-39% bilat   Cataract    REMOVED   Colon polyps 2006   Colonoscopy-Dr. Patterson(Tubular Adenoma)    DM2 (diabetes mellitus, type 2) ([place])    Followed by Dr. Carlis Abbott   History of echocardiogram    Echo 7/19: Mild LVH, EF 26-71, normal diastolic function, trivial AI, moderate LAE   HLD (hyperlipidemia)    HTN (hypertension)    Hx of cardiovascular stress test    ETT-Myoview (03/2013):  Anteroseptal  reversible defect, EF 54%, intermediate risk.   Postoperative atrial fibrillation ([place]) 2009   Ulcer    left foot    Patient Active Problem List   Diagnosis Date Noted   Renal insufficiency 01/31/2019   Ischemic stroke ([place]) 01/30/2019   PAD (peripheral artery disease) ([place]) 12/10/2017   Carotid artery disease ([place]) 01/31/2017   Varicose veins of lower extremities with other complications 24/58/0998   OSA (obstructive sleep apnea) 08/25/2011   Type 2 diabetes mellitus with complication, with Nilaya Bouie-term current use of insulin ([place]) 06/03/2008   Hyperlipidemia 06/03/2008   Essential hypertension 06/03/2008   Coronary Artery Disease 06/03/2008    Past Surgical History:  Procedure Laterality Date   APPENDECTOMY  2005   BACK SURGERY  2006   CARDIAC CATHETERIZATION  2009   EF 60%   COLONOSCOPY     CORONARY ARTERY BYPASS GRAFT  2009   post op. a fib, LIMA -LAD, SVG-D,seq SVG-OM and PLV,  SVG-PDA.   TONSILLECTOMY      Allergies Penicillins  Family History  Problem Relation Age of Onset   Coronary artery disease Other        family history   Heart disease Other        family history   Other Brother        CABG   Other Brother  CABG   Prostate cancer Father    Diabetes Father    Prostate cancer Brother    Diabetes Brother        x 2   Colon cancer Mother     Social History Social History   Tobacco Use   Smoking status: Never Smoker   Smokeless tobacco: Former Neurosurgeon    Types: Chew  Substance Use Topics   Alcohol use: No   Drug use: No    Review of Systems  Constitutional: No fever/chills Eyes: No visual changes. ENT: No sore throat. Cardiovascular: Denies chest pain. Respiratory: Denies shortness of breath. Gastrointestinal: No abdominal pain.  No nausea, no vomiting.  No diarrhea.  No constipation. Genitourinary: Negative for dysuria. Musculoskeletal: Negative for back pain. Skin: Negative for rash. Neurological:  Negative for focal weakness or numbness. Positive HA and intermittent confusion.  10-point ROS otherwise negative.  ____________________________________________   PHYSICAL EXAM:  VITAL SIGNS: ED Triage Vitals [01/30/19 2114]  Enc Vitals Group     BP (!) 144/81     Pulse Rate 71     Resp 18     Temp 98.8 F (37.1 C)     Temp src      SpO2 96 %     Weight 235 lb (106.6 kg)     Height  (1.778 m)   Constitutional: Alert and oriented. Well appearing and in no acute distress. Eyes: Conjunctivae are normal. PERRL. Head: Atraumatic. No tenderness over the temporal scalp.  Nose: No congestion/rhinnorhea. Mouth/Throat: Mucous membranes are moist.   Neck: No stridor.  Cardiovascular: Normal rate, regular rhythm. Good peripheral circulation. Grossly normal heart sounds.   Respiratory: Normal respiratory effort.  No retractions. Lungs CTAB. Gastrointestinal: Soft and nontender. No distention.  Musculoskeletal: No lower extremity tenderness nor edema. No gross deformities of extremities. Neurologic:  Normal speech and language. No gross focal neurologic deficits are appreciated. Normal CN exam 2-12. No pronator drift.  Skin:  Skin is warm, dry and intact. No rash noted.   ____________________________________________   LABS (all labs ordered are listed, but only abnormal results are displayed)  Labs Reviewed  COMPREHENSIVE METABOLIC PANEL - Abnormal; Notable for the following components:      Result Value   Sodium 133 (*)    Chloride 96 (*)    Glucose, Bld 389 (*)    BUN 31 (*)    Creatinine, Ser 1.85 (*)    GFR calc non Af Amer 37 (*)    GFR calc Af Amer 43 (*)    All other components within normal limits  URINALYSIS, ROUTINE W REFLEX MICROSCOPIC - Abnormal; Notable for the following components:   Glucose, UA >=500 (*)    All other components within normal limits  LIPID PANEL - Abnormal; Notable for the following components:   Triglycerides 257 (*)    HDL 36 (*)     VLDL 51 (*)    All other components within normal limits  CBG MONITORING, ED - Abnormal; Notable for the following components:   Glucose-Capillary 242 (*)    All other components within normal limits  CBG MONITORING, ED - Abnormal; Notable for the following components:   Glucose-Capillary 212 (*)    All other components within normal limits  SARS CORONAVIRUS 2 (TAT 6-24 HRS)  PROTIME-INR  APTT  CBC  DIFFERENTIAL  LIPASE, BLOOD  ETHANOL  RAPID URINE DRUG SCREEN, HOSP PERFORMED  HIV ANTIBODY (ROUTINE TESTING W REFLEX)  HEMOGLOBIN A1C  SODIUM, URINE, RANDOM  CREATININE, URINE, RANDOM  UREA NITROGEN, URINE   ____________________________________________  EKG   EKG Interpretation  Date/Time:  Monday January 30 2019 21:37:45 EDT Ventricular Rate:  68 PR Interval:    QRS Duration: 100 QT Interval:  409 QTC Calculation: 435 R Axis:   7 Text Interpretation: Sinus rhythm Nonspecific T abnormalities, lateral leads No STEMI Confirmed by Alona Bene [phone]) on 01/30/2019 9:58:37 PM       ____________________________________________  RADIOLOGY  Ct Head Wo Contrast  Result Date: 01/30/2019 CLINICAL DATA:  Altered level of consciousness, unexplained EXAM: CT HEAD WITHOUT CONTRAST TECHNIQUE: Contiguous axial images were obtained from the base of the skull through the vertex without intravenous contrast. COMPARISON:  None. FINDINGS: Brain: Wedge-shaped region of hypoattenuation involving the cortex and white matter of the right parieto-occipital region extending from the posterior horn lateral ventricle to the cortex, involving the MCA/PCA watershed distribution. No evidence of acute intracranial hemorrhage. No mass effect or midline shift. No extra-axial collection. Background of mild parenchymal volume loss and chronic microvascular ischemic angiopathy. Vascular: Atherosclerotic calcification of the carotid siphons and intradural vertebral arteries. No hyperdense vessel. Skull: No  calvarial fracture or suspicious osseous lesion. No scalp swelling or hematoma. Sinuses/Orbits: Paranasal sinuses and mastoid air cells are predominantly clear. Included orbital structures are unremarkable. Other: None IMPRESSION: 1. Wedge-shaped region of hypoattenuation involving the cortex and white matter of the right parieto-occipital region extending from the posterior horn lateral ventricle to the cortex, involving the MCA/PCA watershed distribution, consistent with acute/subacute infarct. No evidence of acute intracranial hemorrhage. 2. Mild parenchymal volume loss and chronic microvascular ischemic angiopathy. These results were called by telephone at the time of interpretation on 01/30/2019 at 10:43 pm to provider Criss Bartles , who verbally acknowledged these results. Electronically Signed   By: Kreg Shropshire M.D.   On: 01/30/2019 22:46   Mr Angio Head Wo Contrast  Result Date: 01/31/2019 CLINICAL DATA:  Headache, acute, severe, worse headache of life. Stroke, follow-up. Additional history provided: Headache, acute, severe, worst headache of life. Patient awoke Sunday morning with headache and confusion, states confusion is better but not resolved. EXAM: MRI HEAD WITHOUT CONTRAST MRA HEAD WITHOUT CONTRAST TECHNIQUE: Multiplanar, multiecho pulse sequences of the brain and surrounding structures were obtained without intravenous contrast. Angiographic images of the head were obtained using MRA technique without contrast. COMPARISON:  Head CT 01/30/2019 FINDINGS: MRI HEAD FINDINGS Brain: Again demonstrated is a moderate-sized acute/early subacute cortical/subcortical infarct within the posterior right MCA vascular territory involving portions of the right parietal, occipital and temporal lobes. There is a separate 13 mm acute/early subacute cortical infarct within the high posterior right parietal lobe. There are several additional scattered punctate acute/early subacute cortical infarcts within the right  frontoparietal and right temporal lobes. Corresponding T2/FLAIR hyperintensity at these sites. Susceptibility weighted signal loss consistent with petechial hemorrhage in the region of the dominant infarct. There is a background of mild chronic small vessel ischemic disease. No evidence of intracranial mass. No midline shift or extra-axial fluid collection. Mild generalized parenchymal atrophy. Vascular: Reported separately Skull and upper cervical spine: No focal marrow lesion Sinuses/Orbits: Visualized orbits demonstrate no acute abnormality. Trace ethmoid sinus mucosal thickening. Small bilateral mastoid effusions. Incidentally noted Tornwaldt cyst. MRA HEAD FINDING: The intracranial internal carotid arteries are patent without significant proximal stenosis. The bilateral middle and anterior cerebral arteries are patent. Mild focal stenosis within the A1 right anterior cerebral artery. Otherwise no significant stenosis within these vessels. No intracranial aneurysm is identified. The non  dominant intracranial right vertebral artery is patent and terminates as the right PICA. The left vertebral artery is patent without significant stenosis and supplies the basilar artery. The basilar artery is patent without significant stenosis. Predominantly fetal origin of the left posterior cerebral artery. There are sites of apparent mild to moderate segmental stenosis within the bilateral P2 posterior cerebral arteries. There is a sizable right posterior communicating artery. These results were called by telephone at the time of interpretation on 01/31/2019 at 9:30 am to provider Kari BaarsEDWARD HAWKINS , who verbally acknowledged these results. IMPRESSION: MRI brain: 1. Redemonstrated moderate-sized acute/early subacute cortical/subcortical infarct within the posterior right MCA vascular territory. Additional small acute/early subacute cortical infarct within the right parietal lobe, as well as multiple punctate acute/early subacute  cortical infarcts within the right frontoparietal and right temporal lobes. Petechial hemorrhage in the region of the dominant infarct. Question an embolic source within the cervical right ICA. Correlate with findings on scheduled carotid artery duplex. 2. Mild generalized parenchymal atrophy and chronic small vessel ischemic disease. 3. Small bilateral mastoid effusions. MRA head: 1. No intracranial anterior circulation large vessel occlusion or proximal high-grade arterial stenosis. Mild focal stenosis within the A1 right anterior cerebral artery. 2. Atherosclerotic irregularity of the posterior cerebral arteries with sites of apparent mild to moderate segmental stenosis within the P2 segments bilaterally. Electronically Signed   By: Jackey LogeKyle  Golden DO   On: 01/31/2019 09:31   Mr Brain Wo Contrast  Result Date: 01/31/2019 CLINICAL DATA:  Headache, acute, severe, worse headache of life. Stroke, follow-up. Additional history provided: Headache, acute, severe, worst headache of life. Patient awoke Sunday morning with headache and confusion, states confusion is better but not resolved. EXAM: MRI HEAD WITHOUT CONTRAST MRA HEAD WITHOUT CONTRAST TECHNIQUE: Multiplanar, multiecho pulse sequences of the brain and surrounding structures were obtained without intravenous contrast. Angiographic images of the head were obtained using MRA technique without contrast. COMPARISON:  Head CT 01/30/2019 FINDINGS: MRI HEAD FINDINGS Brain: Again demonstrated is a moderate-sized acute/early subacute cortical/subcortical infarct within the posterior right MCA vascular territory involving portions of the right parietal, occipital and temporal lobes. There is a separate 13 mm acute/early subacute cortical infarct within the high posterior right parietal lobe. There are several additional scattered punctate acute/early subacute cortical infarcts within the right frontoparietal and right temporal lobes. Corresponding T2/FLAIR  hyperintensity at these sites. Susceptibility weighted signal loss consistent with petechial hemorrhage in the region of the dominant infarct. There is a background of mild chronic small vessel ischemic disease. No evidence of intracranial mass. No midline shift or extra-axial fluid collection. Mild generalized parenchymal atrophy. Vascular: Reported separately Skull and upper cervical spine: No focal marrow lesion Sinuses/Orbits: Visualized orbits demonstrate no acute abnormality. Trace ethmoid sinus mucosal thickening. Small bilateral mastoid effusions. Incidentally noted Tornwaldt cyst. MRA HEAD FINDING: The intracranial internal carotid arteries are patent without significant proximal stenosis. The bilateral middle and anterior cerebral arteries are patent. Mild focal stenosis within the A1 right anterior cerebral artery. Otherwise no significant stenosis within these vessels. No intracranial aneurysm is identified. The non dominant intracranial right vertebral artery is patent and terminates as the right PICA. The left vertebral artery is patent without significant stenosis and supplies the basilar artery. The basilar artery is patent without significant stenosis. Predominantly fetal origin of the left posterior cerebral artery. There are sites of apparent mild to moderate segmental stenosis within the bilateral P2 posterior cerebral arteries. There is a sizable right posterior communicating artery. These results were called  by telephone at the time of interpretation on 01/31/2019 at 9:30 am to provider Kari Baars , who verbally acknowledged these results. IMPRESSION: MRI brain: 1. Redemonstrated moderate-sized acute/early subacute cortical/subcortical infarct within the posterior right MCA vascular territory. Additional small acute/early subacute cortical infarct within the right parietal lobe, as well as multiple punctate acute/early subacute cortical infarcts within the right frontoparietal and right  temporal lobes. Petechial hemorrhage in the region of the dominant infarct. Question an embolic source within the cervical right ICA. Correlate with findings on scheduled carotid artery duplex. 2. Mild generalized parenchymal atrophy and chronic small vessel ischemic disease. 3. Small bilateral mastoid effusions. MRA head: 1. No intracranial anterior circulation large vessel occlusion or proximal high-grade arterial stenosis. Mild focal stenosis within the A1 right anterior cerebral artery. 2. Atherosclerotic irregularity of the posterior cerebral arteries with sites of apparent mild to moderate segmental stenosis within the P2 segments bilaterally. Electronically Signed   By: Jackey Loge DO   On: 01/31/2019 09:31    ____________________________________________   PROCEDURES  Procedure(s) performed:   Procedures  CRITICAL CARE Performed by: Maia Plan Total critical care time: 35 minutes Critical care time was exclusive of separately billable procedures and treating other patients. Critical care was necessary to treat or prevent imminent or life-threatening deterioration. Critical care was time spent personally by me on the following activities: development of treatment plan with patient and/or surrogate as well as nursing, discussions with consultants, evaluation of patient's response to treatment, examination of patient, obtaining history from patient or surrogate, ordering and performing treatments and interventions, ordering and review of laboratory studies, ordering and review of radiographic studies, pulse oximetry and re-evaluation of patient's condition.  Alona Bene, MD Emergency Medicine  ____________________________________________   INITIAL IMPRESSION / ASSESSMENT AND PLAN / ED COURSE  Pertinent labs & imaging results that were available during my care of the patient were reviewed by me and considered in my medical decision making (see chart for details).   Patient  presents to the emergency department for evaluation of right-sided headache with intermittent confusion over the past 2 days.  Headache is very atypical for subarachnoid hemorrhage or infection.  He describes pain mainly with moving his head but not at rest.  He has no symptoms to strongly suspect temporal arteritis.  No other neurologic deficits on exam.  He is alert and oriented x4 here.  His confusion is occasional.  No new medications.  Labs do not point to a metabolic cause for his confusion.  Lower suspicion overall for stroke.   CT with acute/subacute infarct. Labs interpreted. Updated patient and wife. Will admit for CVA w/u.   Discussed patient's case with [facility], Dr. Antionette Char to request admission. Patient and family (if present) updated with plan. Care transferred to [facility] service.  I reviewed all nursing notes, vitals, pertinent old records, EKGs, labs, imaging (as available).  ____________________________________________  FINAL CLINICAL IMPRESSION(S) / ED DIAGNOSES  Final diagnoses:  Cerebrovascular accident (CVA), unspecified mechanism (HCC)  Renal insufficiency  Renal insufficiency     MEDICATIONS GIVEN DURING THIS VISIT:  Medications  ondansetron (ZOFRAN) injection 4 mg (4 mg Intravenous Not Given 01/30/19 2210)  aspirin EC tablet 81 mg (has no administration in time range)  rosuvastatin (CRESTOR) tablet 5 mg (has no administration in time range)  amitriptyline (ELAVIL) tablet 75 mg (has no administration in time range)  clopidogrel (PLAVIX) tablet 75 mg (has no administration in time range)   stroke: mapping our early stages of recovery book (  0 each Does not apply Hold 01/31/19 0159)  acetaminophen (TYLENOL) tablet 650 mg (650 mg Oral Given 01/31/19 0808)    Or  acetaminophen (TYLENOL) 160 MG/5ML solution 650 mg ( Per Tube See Alternative 01/31/19 0808)    Or  acetaminophen (TYLENOL) suppository 650 mg ( Rectal See Alternative 01/31/19 0808)  senna-docusate (Senokot-S) tablet  1 tablet (has no administration in time range)  heparin injection 5,000 Units (5,000 Units Subcutaneous Given 01/31/19 0202)  sodium chloride flush (NS) 0.9 % injection 3 mL (3 mLs Intravenous Given 01/31/19 0221)  sodium chloride flush (NS) 0.9 % injection 3 mL (has no administration in time range)  0.9 %  sodium chloride infusion (has no administration in time range)  insulin detemir (LEVEMIR) injection 20 Units (20 Units Subcutaneous Given 01/31/19 0259)  insulin aspart (novoLOG) injection 0-9 Units (3 Units Subcutaneous Given 01/31/19 0808)  insulin aspart (novoLOG) injection 0-5 Units (2 Units Subcutaneous Given 01/31/19 0201)  0.9 %  sodium chloride infusion ( Intravenous Stopped 01/31/19 0800)  HYDROcodone-acetaminophen (NORCO/VICODIN) 5-325 MG per tablet 1-2 tablet (1 tablet Oral Given 01/31/19 0203)  calcium carbonate (TUMS - dosed in mg elemental calcium) chewable tablet 200 mg of elemental calcium (200 mg of elemental calcium Oral Given 01/31/19 0203)    Note:  This document was prepared using Dragon voice recognition software and may include unintentional dictation errors.  Alona Bene, MD, [name] Emergency Medicine    Rayvon Brandvold, Arlyss Repress, MD 01/31/19 6578    Maia Plan, MD 02/06/19 [phone]

## 2019-01-31 ENCOUNTER — Other Ambulatory Visit (HOSPITAL_COMMUNITY): Payer: Medicare Other

## 2019-01-31 ENCOUNTER — Observation Stay (HOSPITAL_COMMUNITY): Payer: Medicare Other

## 2019-01-31 ENCOUNTER — Encounter (HOSPITAL_COMMUNITY): Payer: Self-pay | Admitting: Family Medicine

## 2019-01-31 DIAGNOSIS — N289 Disorder of kidney and ureter, unspecified: Secondary | ICD-10-CM | POA: Diagnosis not present

## 2019-01-31 DIAGNOSIS — Z951 Presence of aortocoronary bypass graft: Secondary | ICD-10-CM | POA: Diagnosis not present

## 2019-01-31 DIAGNOSIS — Z87891 Personal history of nicotine dependence: Secondary | ICD-10-CM | POA: Diagnosis not present

## 2019-01-31 DIAGNOSIS — I639 Cerebral infarction, unspecified: Secondary | ICD-10-CM | POA: Diagnosis present

## 2019-01-31 DIAGNOSIS — I6623 Occlusion and stenosis of bilateral posterior cerebral arteries: Secondary | ICD-10-CM | POA: Diagnosis not present

## 2019-01-31 DIAGNOSIS — Z7982 Long term (current) use of aspirin: Secondary | ICD-10-CM | POA: Diagnosis not present

## 2019-01-31 DIAGNOSIS — Z8719 Personal history of other diseases of the digestive system: Secondary | ICD-10-CM | POA: Diagnosis not present

## 2019-01-31 DIAGNOSIS — E118 Type 2 diabetes mellitus with unspecified complications: Secondary | ICD-10-CM | POA: Diagnosis not present

## 2019-01-31 DIAGNOSIS — N189 Chronic kidney disease, unspecified: Secondary | ICD-10-CM

## 2019-01-31 DIAGNOSIS — R4182 Altered mental status, unspecified: Secondary | ICD-10-CM | POA: Diagnosis not present

## 2019-01-31 DIAGNOSIS — I361 Nonrheumatic tricuspid (valve) insufficiency: Secondary | ICD-10-CM | POA: Diagnosis not present

## 2019-01-31 DIAGNOSIS — E669 Obesity, unspecified: Secondary | ICD-10-CM | POA: Diagnosis present

## 2019-01-31 DIAGNOSIS — Z7902 Long term (current) use of antithrombotics/antiplatelets: Secondary | ICD-10-CM | POA: Diagnosis not present

## 2019-01-31 DIAGNOSIS — Z20828 Contact with and (suspected) exposure to other viral communicable diseases: Secondary | ICD-10-CM | POA: Diagnosis present

## 2019-01-31 DIAGNOSIS — R51 Headache with orthostatic component, not elsewhere classified: Secondary | ICD-10-CM | POA: Diagnosis not present

## 2019-01-31 DIAGNOSIS — Z79899 Other long term (current) drug therapy: Secondary | ICD-10-CM | POA: Diagnosis not present

## 2019-01-31 DIAGNOSIS — G4733 Obstructive sleep apnea (adult) (pediatric): Secondary | ICD-10-CM | POA: Diagnosis present

## 2019-01-31 DIAGNOSIS — I63233 Cerebral infarction due to unspecified occlusion or stenosis of bilateral carotid arteries: Secondary | ICD-10-CM | POA: Diagnosis not present

## 2019-01-31 DIAGNOSIS — Z794 Long term (current) use of insulin: Secondary | ICD-10-CM | POA: Diagnosis not present

## 2019-01-31 DIAGNOSIS — Z8249 Family history of ischemic heart disease and other diseases of the circulatory system: Secondary | ICD-10-CM | POA: Diagnosis not present

## 2019-01-31 DIAGNOSIS — G459 Transient cerebral ischemic attack, unspecified: Secondary | ICD-10-CM | POA: Diagnosis not present

## 2019-01-31 DIAGNOSIS — Z880 Allergy status to penicillin: Secondary | ICD-10-CM | POA: Diagnosis not present

## 2019-01-31 DIAGNOSIS — Z833 Family history of diabetes mellitus: Secondary | ICD-10-CM | POA: Diagnosis not present

## 2019-01-31 DIAGNOSIS — I2510 Atherosclerotic heart disease of native coronary artery without angina pectoris: Secondary | ICD-10-CM | POA: Diagnosis present

## 2019-01-31 DIAGNOSIS — I4891 Unspecified atrial fibrillation: Secondary | ICD-10-CM | POA: Diagnosis present

## 2019-01-31 DIAGNOSIS — Z800 Family history of malignant neoplasm of digestive organs: Secondary | ICD-10-CM | POA: Diagnosis not present

## 2019-01-31 DIAGNOSIS — E785 Hyperlipidemia, unspecified: Secondary | ICD-10-CM | POA: Diagnosis present

## 2019-01-31 DIAGNOSIS — Z8042 Family history of malignant neoplasm of prostate: Secondary | ICD-10-CM | POA: Diagnosis not present

## 2019-01-31 DIAGNOSIS — Z23 Encounter for immunization: Secondary | ICD-10-CM | POA: Diagnosis not present

## 2019-01-31 DIAGNOSIS — I10 Essential (primary) hypertension: Secondary | ICD-10-CM | POA: Diagnosis present

## 2019-01-31 HISTORY — DX: Disorder of kidney and ureter, unspecified: N28.9

## 2019-01-31 LAB — URINALYSIS, ROUTINE W REFLEX MICROSCOPIC
Bacteria, UA: NONE SEEN
Bilirubin Urine: NEGATIVE
Glucose, UA: >=500 mg/dL — AB
Hgb urine dipstick: NEGATIVE
Ketones, ur: NEGATIVE mg/dL
Leukocytes,Ua: NEGATIVE
Nitrite: NEGATIVE
Protein, ur: NEGATIVE mg/dL
Specific Gravity, Urine: 1.012 (ref 1.005–1.030)
pH: 5 (ref 5.0–8.0)

## 2019-01-31 LAB — RAPID URINE DRUG SCREEN, HOSP PERFORMED
Amphetamines: NOT DETECTED
Barbiturates: NOT DETECTED
Benzodiazepines: NOT DETECTED
Cocaine: NOT DETECTED
Opiates: NOT DETECTED
Tetrahydrocannabinol: NOT DETECTED

## 2019-01-31 LAB — GLUCOSE, CAPILLARY
Glucose-Capillary: 238 mg/dL — ABNORMAL HIGH (ref 70–99)
Glucose-Capillary: 312 mg/dL — ABNORMAL HIGH (ref 70–99)

## 2019-01-31 LAB — LIPID PANEL
Cholesterol: 150 mg/dL (ref 0–200)
HDL: 36 mg/dL — ABNORMAL LOW (ref >40)
LDL Cholesterol: 63 mg/dL (ref 0–99)
Total CHOL/HDL Ratio: 4.2 RATIO
Triglycerides: 257 mg/dL — ABNORMAL HIGH (ref <150)
VLDL: 51 mg/dL — ABNORMAL HIGH (ref 0–40)

## 2019-01-31 LAB — SODIUM, URINE, RANDOM: Sodium, Ur: 59 mmol/L

## 2019-01-31 LAB — HEMOGLOBIN A1C
Hgb A1c MFr Bld: 9.2 % — ABNORMAL HIGH (ref 4.8–5.6)
Mean Plasma Glucose: 217.34 mg/dL

## 2019-01-31 LAB — CREATININE, URINE, RANDOM: Creatinine, Urine: 62.25 mg/dL

## 2019-01-31 LAB — CBG MONITORING, ED
Glucose-Capillary: 242 mg/dL — ABNORMAL HIGH (ref 70–99)
Glucose-Capillary: 212 mg/dL — ABNORMAL HIGH (ref 70–99)
Glucose-Capillary: 299 mg/dL — ABNORMAL HIGH (ref 70–99)

## 2019-01-31 LAB — HIV ANTIBODY (ROUTINE TESTING W REFLEX): HIV Screen 4th Generation wRfx: NONREACTIVE

## 2019-01-31 LAB — SARS CORONAVIRUS 2 (TAT 6-24 HRS): SARS Coronavirus 2: NEGATIVE

## 2019-01-31 MED ORDER — INSULIN ASPART 100 UNIT/ML ~~LOC~~ SOLN
0.0000 [IU] | Freq: Every day | SUBCUTANEOUS | Status: DC
  Administered 2019-01-31: 4 [IU] via SUBCUTANEOUS
  Administered 2019-01-31: 2 [IU] via SUBCUTANEOUS

## 2019-01-31 MED ORDER — SODIUM CHLORIDE 0.9% FLUSH: 3.0000 mL | INTRAVENOUS | Status: DC | PRN

## 2019-01-31 MED ORDER — STROKE: EARLY STAGES OF RECOVERY BOOK
Freq: Once | Status: DC
  Filled 2019-01-31: qty 1

## 2019-01-31 MED ORDER — CALCIUM CARBONATE ANTACID 500 MG PO CHEW
1.0000 | CHEWABLE_TABLET | Freq: Two times a day (BID) | ORAL | Status: DC | PRN
  Administered 2019-01-31: 200 mg via ORAL
  Filled 2019-01-31: qty 1

## 2019-01-31 MED ORDER — HEPARIN SODIUM (PORCINE) 5000 UNIT/ML IJ SOLN
5000.0000 [IU] | Freq: Three times a day (TID) | INTRAMUSCULAR | Status: DC
  Administered 2019-01-31 – 2019-02-01 (×5): 5000 [IU] via SUBCUTANEOUS
  Filled 2019-01-31 (×6): qty 1

## 2019-01-31 MED ORDER — ACETAMINOPHEN 160 MG/5ML PO SOLN: 650.0000 mg | ORAL | Status: DC | PRN

## 2019-01-31 MED ORDER — INSULIN DETEMIR 100 UNIT/ML ~~LOC~~ SOLN
20.0000 [IU] | Freq: Two times a day (BID) | SUBCUTANEOUS | Status: DC
  Administered 2019-01-31 – 2019-02-01 (×4): 20 [IU] via SUBCUTANEOUS
  Filled 2019-01-31 (×7): qty 0.2

## 2019-01-31 MED ORDER — ASPIRIN EC 81 MG PO TBEC
81.0000 mg | DELAYED_RELEASE_TABLET | Freq: Every day | ORAL | Status: DC
  Administered 2019-01-31: 81 mg via ORAL
  Filled 2019-01-31: qty 1

## 2019-01-31 MED ORDER — INSULIN ASPART 100 UNIT/ML ~~LOC~~ SOLN
0.0000 [IU] | Freq: Three times a day (TID) | SUBCUTANEOUS | Status: DC
  Administered 2019-01-31: 3 [IU] via SUBCUTANEOUS
  Administered 2019-01-31: 12:00:00 5 [IU] via SUBCUTANEOUS
  Administered 2019-01-31: 3 [IU] via SUBCUTANEOUS
  Administered 2019-02-01 (×2): 5 [IU] via SUBCUTANEOUS
  Filled 2019-01-31 (×3): qty 1

## 2019-01-31 MED ORDER — ACETAMINOPHEN 325 MG PO TABS
650.0000 mg | ORAL_TABLET | ORAL | Status: DC | PRN
  Administered 2019-01-31 – 2019-02-01 (×3): 650 mg via ORAL
  Filled 2019-01-31 (×3): qty 2

## 2019-01-31 MED ORDER — SODIUM CHLORIDE 0.9% FLUSH
3.0000 mL | Freq: Two times a day (BID) | INTRAVENOUS | Status: DC
  Administered 2019-01-31 – 2019-02-01 (×4): 3 mL via INTRAVENOUS

## 2019-01-31 MED ORDER — SENNOSIDES-DOCUSATE SODIUM 8.6-50 MG PO TABS: 1.0000 | ORAL_TABLET | Freq: Every evening | ORAL | Status: DC | PRN

## 2019-01-31 MED ORDER — AMITRIPTYLINE HCL 25 MG PO TABS
75.0000 mg | ORAL_TABLET | Freq: Every day | ORAL | Status: DC
  Administered 2019-01-31: 75 mg via ORAL
  Filled 2019-01-31: qty 3

## 2019-01-31 MED ORDER — HYDROCODONE-ACETAMINOPHEN 5-325 MG PO TABS
1.0000 | ORAL_TABLET | Freq: Four times a day (QID) | ORAL | Status: DC | PRN
  Administered 2019-01-31: 1 via ORAL
  Administered 2019-01-31: 2 via ORAL
  Filled 2019-01-31: qty 2
  Filled 2019-01-31: qty 1

## 2019-01-31 MED ORDER — SODIUM CHLORIDE 0.9 % IV SOLN
INTRAVENOUS | Status: AC
  Administered 2019-01-31: 01:00:00 via INTRAVENOUS

## 2019-01-31 MED ORDER — INFLUENZA VAC A&B SA ADJ QUAD 0.5 ML IM PRSY
0.5000 mL | PREFILLED_SYRINGE | INTRAMUSCULAR | Status: AC
  Administered 2019-02-01: 0.5 mL via INTRAMUSCULAR
  Filled 2019-01-31: qty 0.5

## 2019-01-31 MED ORDER — CLOPIDOGREL BISULFATE 75 MG PO TABS
75.0000 mg | ORAL_TABLET | Freq: Every day | ORAL | Status: DC
  Administered 2019-01-31 – 2019-02-01 (×2): 75 mg via ORAL
  Filled 2019-01-31 (×2): qty 1

## 2019-01-31 MED ORDER — SODIUM CHLORIDE 0.9 % IV SOLN: 250.0000 mL | INTRAVENOUS | Status: DC | PRN

## 2019-01-31 MED ORDER — ROSUVASTATIN CALCIUM 5 MG PO TABS
5.0000 mg | ORAL_TABLET | Freq: Every day | ORAL | Status: DC
  Administered 2019-01-31: 5 mg via ORAL
  Filled 2019-01-31 (×3): qty 1

## 2019-01-31 MED ORDER — PNEUMOCOCCAL VAC POLYVALENT 25 MCG/0.5ML IJ INJ
0.5000 mL | INJECTION | INTRAMUSCULAR | Status: AC
  Administered 2019-02-01: 0.5 mL via INTRAMUSCULAR
  Filled 2019-01-31: qty 0.5

## 2019-01-31 MED ORDER — ACETAMINOPHEN 650 MG RE SUPP: 650.0000 mg | RECTAL | Status: DC | PRN

## 2019-01-31 NOTE — ED Notes (Signed)
Patient went to MRI.

## 2019-01-31 NOTE — Consult Note (Addendum)
[facility] [name] Aleshire A. Gerilyn Pilgrimoonquah, MD     [email or web address]          [name] is an 68 y.o. male.   ASSESSMENT/PLAN: 1.  Acute confusion and headaches: Etiology is acute ischemic stroke involving the right temporoparietal and occipital areas.  The distribution is in 2 vascular territories which is highly concerning for cardioembolic stroke.  Risk factors age, diabetes, hypertension, suspected obstructive sleep apnea syndrome, dyslipidemia and coronary artery disease.  Patient can remain on his current dual antiplatelet agents.  Given suspicion of cardioembolic phenomena, a 30-day cardiac event monitor is recommended.  Follow-up echocardiography. 2.  Likely significant obstructive sleep apnea syndrome should be evaluated and be treated clinically at the hospital.    Patient is 68 year old right-handed white male who presents with the acute onset of confusion, difficulty seeing him and there right temporal headache about 4 days ago on Sunday morning.  The patient presented outside of the thrombolytic thrombectomy window and therefore was not considered for interventional therapies.  He tells me he has been on dual antiplatelet treatment since she had coronary bypass in 2009.  Patient reports that he did not have focal numbness or weakness.  He did report having some abdominal discomforts but no clear dizziness.  He does not report having palpitations or chest pain or shortness of breath.  Review of systems otherwise negative.    GENERAL: This a very pleasant male who appears about 10 years younger than stated age.  He is obese   HEENT: He has a large neck and crowded airway.  Tongue size is large.  Neck is supple no trauma appreciated.  ABDOMEN: soft  EXTREMITIES: No edema   BACK: Normal  SKIN: Normal by inspection.    MENTAL STATUS: Alert and oriented -including orientation to his age and the month. Speech, language and cognition are generally intact. Judgment and  insight normal.   CRANIAL NERVES: Pupils are equal, round and reactive to light and accomodation; extra ocular movements are full, there is no significant nystagmus; visual fields are full; upper and lower facial muscles are normal in strength and symmetric, there is no flattening of the nasolabial folds; tongue is midline; uvula is midline; shoulder elevation is normal.  MOTOR: Normal tone, bulk and strength; there is mild drift of the left upper extremity.  There is no drift involving the left leg.  There is no drift on the right side.  COORDINATION: Left finger to nose is normal, right finger to nose is normal, No rest tremor; no intention tremor; no postural tremor; no bradykinesia.  REFLEXES: Deep tendon reflexes are symmetrical and normal.   SENSATION: Normal to light touch, temperature, and pain.  There is no extinction on double simultaneous stimulation.  GAIT: Normal.   NIH stroke scale 1.  Blood pressure (!) 147/77, pulse 70, temperature 98.8 F (37.1 C), resp. rate 16, height 5\' 10"  (1.778 m), weight 106.6 kg, SpO2 92 %.  Past Medical History:  Diagnosis Date  . CAD (coronary artery disease), native coronary artery    Multiple PCI procedures followed by CABG in 2009 with LIMA-LAD, SVG-D, seq SVG-OM and PLV, SVG-PDA. Echo (3/12): EF 60-65%, mild MR. // Nuc Stress Test 7/19:  EF 50, artifact, no ischemia, low risk   . Carotid stenosis    dopplers 1/14:  0-39% bilat  . Cataract    REMOVED  . Colon polyps 2006   Colonoscopy-Dr. Patterson(Tubular Adenoma)   . DM2 (diabetes mellitus, type 2) (HCC)  Followed by Dr. Chestine Spore  . History of echocardiogram    Echo 7/19: Mild LVH, EF 55-60, normal diastolic function, trivial AI, moderate LAE  . HLD (hyperlipidemia)   . HTN (hypertension)   . Hx of cardiovascular stress test    ETT-Myoview (03/2013):  Anteroseptal reversible defect, EF 54%, intermediate risk.  [name] Postoperative atrial fibrillation (HCC) 2009  . Ulcer    left foot     Past Surgical History:  Procedure Laterality Date  . APPENDECTOMY  2005  . BACK SURGERY  2006  . CARDIAC CATHETERIZATION  2009   EF 60%  . COLONOSCOPY    . CORONARY ARTERY BYPASS GRAFT  2009   post op. a fib, LIMA -LAD, SVG-D,seq SVG-OM and PLV,  SVG-PDA.  . TONSILLECTOMY      Family History  Problem Relation Age of Onset  . Coronary artery disease Other        family history  . Heart disease Other        family history  . Other Brother        CABG  . Other Brother        CABG  . Prostate cancer Father   . Diabetes Father   . Prostate cancer Brother   . Diabetes Brother        x 2  . Colon cancer Mother     Social History:  reports that he has never smoked. He quit smokeless tobacco use about 25 years ago.  His smokeless tobacco use included chew. He reports that he does not drink alcohol or use drugs.  Allergies:  Allergies  Allergen Reactions  . Penicillins Other (See Comments)    Other reaction(s): Other (See Comments) Unknown-Reaction as child Unknown-Reaction as child    Medications: Prior to Admission medications   Medication Sig Start Date End Date Taking? Authorizing Provider  amitriptyline (ELAVIL) 75 MG tablet Take 75 mg by mouth at bedtime.   Yes [provider]  amLODipine (NORVASC) 10 MG tablet Take 1 tablet (10 mg total) by mouth daily. Please make overdue appt with Dr. Excell Seltzer before anymore refills. 2nd attempt Patient taking differently: Take 10 mg by mouth daily.  01/20/19  Yes Tereso Newcomer T, PA-C  aspirin 325 MG EC tablet Take 325 mg by mouth once.   Yes [provider]  aspirin EC 81 MG tablet Take 1 tablet (81 mg total) by mouth daily. 08/24/13  Yes Laurey Morale, MD  benzonatate (TESSALON) 100 MG capsule Take 100 mg by mouth every 8 (eight) hours.   Yes [provider]  clopidogrel (PLAVIX) 75 MG tablet Take 1 tablet (75 mg total) by mouth daily. Please make overdue appt with Dr. Excell Seltzer before anymore refills.  2nd attempt Patient taking differently: Take 75 mg by mouth daily.  01/20/19  Yes Weaver, Scott T, PA-C  ezetimibe (ZETIA) 10 MG tablet Take 1 tablet (10 mg total) by mouth daily. Please make overdue appt with Dr. Excell Seltzer before anymore refills. 1st attempt Patient taking differently: Take 10 mg by mouth daily.  01/18/19  Yes Weaver, Scott T, PA-C  fenofibrate 54 MG tablet Take 1 tablet (54 mg total) by mouth daily. Please call [phone] to schedule a f/u to continue refills. Thank you. 1st attmpt Patient taking differently: Take 54 mg by mouth daily.  01/12/19  Yes Weaver, Scott T, PA-C  furosemide (LASIX) 40 MG tablet Take 1 tablet (40 mg total) by mouth daily. 12/10/17  Yes Tereso Newcomer T, PA-C  metFORMIN (GLUCOPHAGE) 1000 MG tablet Take 1,000 mg by mouth 2 (two) times daily with a meal.   Yes [provider]  metoprolol tartrate (LOPRESSOR) 100 MG tablet Take 1 tablet (100 mg total) by mouth 2 (two) times daily. Please make overdue appt with Dr. Excell Seltzer before anymore refills. 1st attempt Patient taking differently: Take 100 mg by mouth 2 (two) times daily.  01/18/19  Yes Weaver, Scott T, PA-C  nitroGLYCERIN (NITROSTAT) 0.4 MG SL tablet Place 1 tablet (0.4 mg total) under the tongue every 5 (five) minutes as needed for chest pain. 12/10/17 01/30/19 Yes Weaver, Scott T, PA-C  NOVOLOG MIX 70/30 FLEXPEN (70-30) 100 UNIT/ML Pen Inject 40-60 Units into the skin 2 (two) times daily. Per sliding scale 07/10/13  Yes [provider]  rosuvastatin (CRESTOR) 5 MG tablet Take 1 tablet (5 mg total) by mouth daily. Please make overdue appt with Dr. Excell Seltzer before anymore refills. 1st attempt Patient taking differently: Take 5 mg by mouth daily.  01/18/19  Yes Weaver, Scott T, PA-C  VICTOZA 18 MG/3ML SOPN Inject 1.8 mg into the skin daily.  03/18/15  Yes [provider]    Scheduled Meds: .  stroke: mapping our early stages of recovery book   Does not apply Once  . amitriptyline  75 mg  Oral QHS  . aspirin EC  81 mg Oral Daily  . clopidogrel  75 mg Oral Daily  . heparin  5,000 Units Subcutaneous Q8H  . [START ON 02/01/2019] influenza vaccine adjuvanted  0.5 mL Intramuscular Tomorrow-1000  . insulin aspart  0-5 Units Subcutaneous QHS  . insulin aspart  0-9 Units Subcutaneous TID WC  . insulin detemir  20 Units Subcutaneous BID  . ondansetron  4 mg Intravenous Once  . [START ON 02/01/2019] pneumococcal 23 valent vaccine  0.5 mL Intramuscular Tomorrow-1000  . rosuvastatin  5 mg Oral q1800  . sodium chloride flush  3 mL Intravenous Q12H   Continuous Infusions: . sodium chloride     PRN Meds:.sodium chloride, acetaminophen **OR** acetaminophen (TYLENOL) oral liquid 160 mg/5 mL **OR** acetaminophen, calcium carbonate, HYDROcodone-acetaminophen, senna-docusate, sodium chloride flush     Results for orders placed or performed during the hospital encounter of 01/30/19 (from the past 48 hour(s))  Protime-INR     Status: None   Collection Time: 01/30/19  9:48 PM  Result Value Ref Range   Prothrombin Time 12.9 11.4 - 15.2 seconds   INR 1.0 0.8 - 1.2    Comment: (NOTE) INR goal varies based on device and disease states. Performed at [facility], [address] [place], [place] [address]   APTT     Status: None   Collection Time: 01/30/19  9:48 PM  Result Value Ref Range   aPTT 29 24 - 36 seconds    Comment: Performed at [facility], [address] [place], [place] [address]  CBC     Status: None   Collection Time: 01/30/19  9:48 PM  Result Value Ref Range   WBC 8.1 4.0 - 10.5 K/uL   RBC 5.64 4.22 - 5.81 MIL/uL   Hemoglobin 16.4 13.0 - 17.0 g/dL   HCT 14.7 82.9 - 56.2 %   MCV 87.4 80.0 - 100.0 fL   MCH 29.1 26.0 - 34.0 pg   MCHC 33.3 30.0 - 36.0 g/dL   RDW 13.0 86.5 - 78.4 %   Platelets 283 150 - 400 K/uL   nRBC 0.0 0.0 - 0.2 %    Comment: Performed at  [facility], [address], [place], [place] [address]  Differential     Status: None   Collection Time:  01/30/19  9:48 PM  Result Value Ref Range   Neutrophils Relative % 56 %   Neutro Abs 4.6 1.7 - 7.7 K/uL   Lymphocytes Relative 31 %   Lymphs Abs 2.5 0.7 - 4.0 K/uL   Monocytes Relative 8 %   Monocytes Absolute 0.7 0.1 - 1.0 K/uL   Eosinophils Relative 4 %   Eosinophils Absolute 0.3 0.0 - 0.5 K/uL   Basophils Relative 1 %   Basophils Absolute 0.1 0.0 - 0.1 K/uL   Immature Granulocytes 0 %   Abs Immature Granulocytes 0.03 0.00 - 0.07 K/uL    Comment: Performed at [facility], [address], [place], [place] [address]  Comprehensive metabolic panel     Status: Abnormal   Collection Time: 01/30/19  9:48 PM  Result Value Ref Range   Sodium 133 (L) 135 - 145 mmol/L   Potassium 3.7 3.5 - 5.1 mmol/L   Chloride 96 (L) 98 - 111 mmol/L   CO2 24 22 - 32 mmol/L   Glucose, Bld 389 (H) 70 - 99 mg/dL   BUN 31 (H) 8 - 23 mg/dL   Creatinine, Ser 1.85 (H) 0.61 - 1.24 mg/dL   Calcium 9.5 8.9 - 10.3 mg/dL   Total Protein 7.3 6.5 - 8.1 g/dL   Albumin 4.3 3.5 - 5.0 g/dL   AST 29 15 - 41 U/L   ALT 29 0 - 44 U/L   Alkaline Phosphatase 75 38 - 126 U/L   Total Bilirubin 0.7 0.3 - 1.2 mg/dL   GFR calc non Af Amer 37 (L) >60 mL/min   GFR calc Af Amer 43 (L) >60 mL/min   Anion gap 13 5 - 15    Comment: Performed at [facility], [address], [place], [place] [address]  Lipase, blood     Status: None   Collection Time: 01/30/19  9:48 PM  Result Value Ref Range   Lipase 51 11 - 51 U/L    Comment: Performed at [facility], [address] [place], [place] [address]  Ethanol     Status: None   Collection Time: 01/30/19  9:48 PM  Result Value Ref Range   Alcohol, Ethyl (B) <10 <10 mg/dL    Comment: (NOTE) Lowest detectable limit for serum alcohol is 10 mg/dL. For medical purposes only. Performed at [facility], [address] [place], [place] [address]   Hemoglobin A1c     Status: Abnormal   Collection Time: 01/30/19  9:48 PM  Result Value Ref Range   Hgb A1c MFr Bld 9.2 (H) 4.8 - 5.6 %     Comment: (NOTE) Pre diabetes:          5.7%-6.4% Diabetes:              >6.4% Glycemic control for   <7.0% adults with diabetes    Mean Plasma Glucose 217.34 mg/dL    Comment: Performed at [facility] [address] [place], [place] [address]  SARS CORONAVIRUS 2 (TAT 6-24 HRS) Nasopharyngeal Nasopharyngeal Swab     Status: None   Collection Time: 01/30/19 11:02 PM   Specimen: Nasopharyngeal Swab  Result Value Ref Range   SARS Coronavirus 2 NEGATIVE NEGATIVE    Comment: (NOTE) SARS-CoV-2 target nucleic acids are NOT DETECTED. The SARS-CoV-2 RNA is generally detectable in upper and lower respiratory specimens during the acute phase  of infection. Negative results do not preclude SARS-CoV-2 infection, do not rule out co-infections with other pathogens, and should not be used as the sole basis for treatment or other patient management decisions. Negative results must be combined with clinical observations, patient history, and epidemiological information. The expected result is Negative. Fact Sheet for Patients: HairSlick.no Fact Sheet for Healthcare Providers: quierodirigir.com This test is not yet approved or cleared by the [place] FDA and  has been authorized for detection and/or diagnosis of SARS-CoV-2 by FDA under an Emergency Use Authorization (EUA). This EUA will remain  in effect (meaning this test can be used) for the duration of the COVID-19 declaration under Section 56 4(b)(1) of the Act, 21 U.S.C. section 360bbb-3(b)(1), unless the authorization is terminated or revoked sooner. Performed at [facility], [address], [place], [place] [address]   Urine rapid drug screen (hosp performed)     Status: None   Collection Time: 01/30/19 11:34 PM  Result Value Ref Range   Opiates NONE DETECTED NONE DETECTED   Cocaine NONE DETECTED NONE DETECTED   Benzodiazepines NONE DETECTED NONE DETECTED    Amphetamines NONE DETECTED NONE DETECTED   Tetrahydrocannabinol NONE DETECTED NONE DETECTED   Barbiturates NONE DETECTED NONE DETECTED    Comment: (NOTE) DRUG SCREEN FOR MEDICAL PURPOSES ONLY.  IF CONFIRMATION IS NEEDED FOR ANY PURPOSE, NOTIFY LAB WITHIN 5 DAYS. LOWEST DETECTABLE LIMITS FOR URINE DRUG SCREEN Drug Class                     Cutoff (ng/mL) Amphetamine and metabolites    1000 Barbiturate and metabolites    200 Benzodiazepine                 200 Tricyclics and metabolites     300 Opiates and metabolites        300 Cocaine and metabolites        300 THC                            50 Performed at [facility], [address] [place], [place] [address]   Urinalysis, Routine w reflex microscopic     Status: Abnormal   Collection Time: 01/30/19 11:34 PM  Result Value Ref Range   Color, Urine YELLOW YELLOW   APPearance CLEAR CLEAR   Specific Gravity, Urine 1.012 1.005 - 1.030   pH 5.0 5.0 - 8.0   Glucose, UA >=500 (A) NEGATIVE mg/dL   Hgb urine dipstick NEGATIVE NEGATIVE   Bilirubin Urine NEGATIVE NEGATIVE   Ketones, ur NEGATIVE NEGATIVE mg/dL   Protein, ur NEGATIVE NEGATIVE mg/dL   Nitrite NEGATIVE NEGATIVE   Leukocytes,Ua NEGATIVE NEGATIVE   RBC / HPF 0-5 0 - 5 RBC/hpf   WBC, UA 0-5 0 - 5 WBC/hpf   Bacteria, UA NONE SEEN NONE SEEN   Mucus PRESENT    Hyaline Casts, UA PRESENT     Comment: Performed at [facility], [address] [place], [place] [address]  Sodium, urine, random     Status: None   Collection Time: 01/30/19 11:34 PM  Result Value Ref Range   Sodium, Ur 59 mmol/L    Comment: Performed at [facility], [address] [place], [place] [address]  Creatinine, urine, random     Status: None   Collection Time: 01/30/19 11:34 PM  Result Value Ref Range   Creatinine, Urine 62.25 mg/dL    Comment: Performed at  [facility], [address] [place], [place] [address]  CBG monitoring, ED     Status: Abnormal   Collection Time: 01/31/19  1:52 AM  Result  Value Ref Range   Glucose-Capillary 242 (H) 70 - 99 mg/dL  HIV Antibody (routine testing w rflx)     Status: None   Collection Time: 01/31/19  4:44 AM  Result Value Ref Range   HIV Screen 4th Generation wRfx NON REACTIVE NON REACTIVE    Comment: Performed at [facility], [address] [place], [place] [address]  Lipid panel     Status: Abnormal   Collection Time: 01/31/19  4:44 AM  Result Value Ref Range   Cholesterol 150 0 - 200 mg/dL   Triglycerides 098 (H) <150 mg/dL   HDL 36 (L) >11 mg/dL   Total CHOL/HDL Ratio 4.2 RATIO   VLDL 51 (H) 0 - 40 mg/dL   LDL Cholesterol 63 0 - 99 mg/dL    Comment:        Total Cholesterol/HDL:CHD Risk Coronary Heart Disease Risk Table                     Men   Women  1/2 Average Risk   3.4   3.3  Average Risk       5.0   4.4  2 X Average Risk   9.6   7.1  3 X Average Risk  23.4   11.0        Use the calculated Patient Ratio above and the CHD Risk Table to determine the patient's CHD Risk.        ATP III CLASSIFICATION (LDL):  <100     mg/dL   Optimal  914-782  mg/dL   Near or Above                    Optimal  130-159  mg/dL   Borderline  956-213  mg/dL   High  >086     mg/dL   Very High Performed at [facility], [address], [place], [place] [address]   CBG monitoring, ED     Status: Abnormal   Collection Time: 01/31/19  7:59 AM  Result Value Ref Range   Glucose-Capillary 212 (H) 70 - 99 mg/dL  CBG monitoring, ED     Status: Abnormal   Collection Time: 01/31/19 11:22 AM  Result Value Ref Range   Glucose-Capillary 299 (H) 70 - 99 mg/dL  Glucose, capillary     Status: Abnormal   Collection Time: 01/31/19  5:02 PM  Result Value Ref Range   Glucose-Capillary 238 (H) 70 - 99 mg/dL    Studies/Results:   BRAIN MRI MRA FINDINGS: MRI HEAD FINDINGS  Brain:  Again demonstrated is a moderate-sized acute/early subacute cortical/subcortical infarct within the posterior right MCA vascular territory involving portions of  the right parietal, occipital and temporal lobes. There is a separate 13 mm acute/early subacute cortical infarct within the high posterior right parietal lobe. There are several additional scattered punctate acute/early subacute cortical infarcts within the right frontoparietal and right temporal lobes. Corresponding T2/FLAIR hyperintensity at these sites. Susceptibility weighted signal loss consistent with petechial hemorrhage in the region of the dominant infarct. There is a background of mild chronic small vessel ischemic disease.  No evidence of intracranial mass. No midline shift or extra-axial fluid collection. Mild generalized parenchymal atrophy.  Vascular: Reported separately  Skull and upper cervical spine: No focal marrow lesion  Sinuses/Orbits:  Visualized orbits demonstrate no acute abnormality. Trace ethmoid sinus mucosal thickening. Small bilateral mastoid effusions. Incidentally noted Tornwaldt cyst.  MRA HEAD FINDING:  The intracranial internal carotid arteries are patent without significant proximal stenosis.  The bilateral middle and anterior cerebral arteries are patent. Mild focal stenosis within the A1 right anterior cerebral artery. Otherwise no significant stenosis within these vessels.  No intracranial aneurysm is identified.  The non dominant intracranial right vertebral artery is patent and terminates as the right PICA. The left vertebral artery is patent without significant stenosis and supplies the basilar artery. The basilar artery is patent without significant stenosis. Predominantly fetal origin of the left posterior cerebral artery. There are sites of apparent mild to moderate segmental stenosis within the bilateral P2 posterior cerebral arteries. There is a sizable right posterior communicating artery.       CAROTID DOPPLER IMPRESSION: Color duplex indicates minimal heterogeneous and calcified plaque, with no hemodynamically  significant stenosis by duplex criteria in the extracranial cerebrovascular circulation.    HEAD CT FINDINGS: Brain: Wedge-shaped region of hypoattenuation involving the cortex and white matter of the right parieto-occipital region extending from the posterior horn lateral ventricle to the cortex, involving the MCA/PCA watershed distribution. No evidence of acute intracranial hemorrhage. No mass effect or midline shift. No extra-axial collection. Background of mild parenchymal volume loss and chronic microvascular ischemic angiopathy.  Vascular: Atherosclerotic calcification of the carotid siphons and intradural vertebral arteries. No hyperdense vessel.  Skull: No calvarial fracture or suspicious osseous lesion. No scalp swelling or hematoma.  Sinuses/Orbits: Paranasal sinuses and mastoid air cells are predominantly clear. Included orbital structures are unremarkable.  Other: None  IMPRESSION: 1. Wedge-shaped region of hypoattenuation involving the cortex and white matter of the right parieto-occipital region extending from the posterior horn lateral ventricle to the cortex, involving the MCA/PCA watershed distribution, consistent with acute/subacute infarct. No evidence of acute intracranial hemorrhage. 2. Mild parenchymal volume loss and chronic microvascular ischemic angiopathy.    The brain MRI scan is reviewed in person. There is evidence of right lateral temporal frontoparietal infarct. The frontoparietal infarct is in a classic watershed distribution and extends to the lateral [place] region on the ipsilateral side. There is however a few tiny increased signal involving the occipital rollover also on the ipsilateral right side. Findings are consistent with the infarct in both the MCA PCA distribution on the right side. There is microhemorrhage noted in the right temporal infarct area. No significant evidence of prior infarct is noted.  Paticia Moster A. Gerilyn Pilgrim, M.D.   Diplomate, Biomedical engineer of Psychiatry and Neurology ( Neurology). 01/31/2019, 6:54 PM

## 2019-01-31 NOTE — ED Notes (Signed)
Patient evaluated by PT.

## 2019-01-31 NOTE — Evaluation (Signed)
Physical Therapy Evaluation Patient Details Name: [name] MRN: [id number] DOB: Jan 18, 1951 Today's Date: 01/31/2019   History of Present Illness  [name] is a 68 y.o. male with medical history significant for CAD status post CABG, insulin-dependent diabetes mellitus, hypertension, and history of mild renal insufficiency, now presenting to the emergency department with 2 days of headache and confusion.  Patient reports waking 2 days ago with a right-sided headache after going to bed in his usual state of health the night before.  He does not typically get headaches like this.  The headache was not severe, not particularly bothersome, but he went on to note some confusion, specifically describes difficulty interpreting the numbers on a keypad, and reports similar difficulty when trying to focus on a complex task.  He has not had any fevers, chills, cough, shortness of breath, chest pain, palpitations, change in vision or hearing, or any focal numbness or weakness.    Clinical Impression  Patient functioning near baseline for functional mobility and gait, requires slightly increased time to complete functional tasks, ambulated in hallway without loss of balance, c/o mostly of right temporal lobe headache when leaning forward to put on shoes, other than that at baseline.  Plan:  Patient discharged from physical therapy to care of nursing for ambulation daily as tolerated for length of stay.     Follow Up Recommendations No PT follow up    Equipment Recommendations  None recommended by PT    Recommendations for Other Services       Precautions / Restrictions Precautions Precautions: None Restrictions Weight Bearing Restrictions: No      Mobility  Bed Mobility Overal bed mobility: Independent                Transfers Overall transfer level: Modified independent Equipment used: None                Ambulation/Gait Ambulation/Gait assistance: Modified  independent (Device/Increase time) Gait Distance (Feet): 120 Feet Assistive device: None Gait Pattern/deviations: WFL(Within Functional Limits) Gait velocity: slightly decreased   General Gait Details: slightly slower than normal cadence without loss of balance  Stairs            Wheelchair Mobility    Modified [name] (Stroke Patients Only)       Balance Overall balance assessment: No apparent balance deficits (not formally assessed)                                           Pertinent Vitals/Pain Pain Assessment: 0-10 Pain Score: 9  Pain Location: none at rest, up to 10/10 when leaning over in right temporal area Pain Descriptors / Indicators: Aching Pain Intervention(s): Limited activity within patient's tolerance;Monitored during session    Home Living Family/patient expects to be discharged to:: Private residence Living Arrangements: Spouse/significant other Available Help at Discharge: Family;Available 24 hours/day Type of Home: House Home Access: Stairs to enter Entrance Stairs-Rails: Right Entrance Stairs-Number of Steps: 2 Home Layout: One level Home Equipment: Cane - single point      Prior Function Level of Independence: Independent         Comments: Tourist information centre manager, drives, works as Surveyor, minerals   Dominant Hand: Right    Extremity/Trunk Assessment   Upper Extremity Assessment Upper Extremity Assessment: Defer to OT evaluation    Lower Extremity Assessment Lower Extremity  Assessment: Overall WFL for tasks assessed    Cervical / Trunk Assessment Cervical / Trunk Assessment: Normal  Communication   Communication: No difficulties  Cognition Arousal/Alertness: Awake/alert Behavior During Therapy: WFL for tasks assessed/performed Overall Cognitive Status: Within Functional Limits for tasks assessed                                        General Comments      Exercises      Assessment/Plan    PT Assessment Patent does not need any further PT services  PT Problem List         PT Treatment Interventions      PT Goals (Current goals can be found in the Care Plan section)  Acute Rehab PT Goals Patient Stated Goal: return home PT Goal Formulation: With patient Time For Goal Achievement: 01/31/19    Frequency     Barriers to discharge        Co-evaluation               AM-PAC PT "6 Clicks" Mobility  Outcome Measure Help needed turning from your back to your side while in a flat bed without using bedrails?: None Help needed moving from lying on your back to sitting on the side of a flat bed without using bedrails?: None Help needed moving to and from a bed to a chair (including a wheelchair)?: None Help needed standing up from a chair using your arms (e.g., wheelchair or bedside chair)?: None Help needed to walk in hospital room?: None Help needed climbing 3-5 steps with a railing? : None 6 Click Score: 24    End of Session   Activity Tolerance: Patient tolerated treatment well Patient left: in bed;with call bell/phone within reach Nurse Communication: Mobility status PT Visit Diagnosis: Unsteadiness on feet (R26.81);Other abnormalities of gait and mobility (R26.89);Muscle weakness (generalized) (M62.81)    Time: 7654-6503 PT Time Calculation (min) (ACUTE ONLY): 20 min   Charges:   PT Evaluation $PT Eval Moderate Complexity: 1 Mod PT Treatments $Therapeutic Activity: 8-22 mins        2:19 PM, 01/31/19 Lonell Grandchild, MPT Physical Therapist with [facility] [phone] office [phone] mobile phone

## 2019-01-31 NOTE — Progress Notes (Signed)
Subjective: He is admitted with a stroke.  He has a headache on the right side.  He is known to have coronary artery occlusive disease status post bypass grafting insulin-dependent diabetes hypertension renal insufficiency.  I have not seen him in about a year.  Covid testing still in progress  Objective: Vital signs in last 24 hours: Temp:  [98.8 F (37.1 C)] 98.8 F (37.1 C) (10/26 2114) Pulse Rate:  [63-73] 69 (10/27 0730) Resp:  [10-21] 16 (10/27 0730) BP: (105-144)/(60-81) 133/68 (10/27 0730) SpO2:  [90 %-96 %] 92 % (10/27 0752) Weight:  [106.6 kg] 106.6 kg (10/26 2114) Weight change:     Intake/Output from previous day: 10/26 0701 - 10/27 0700 In: -  Out: 500 [Urine:500]  PHYSICAL EXAM General appearance: alert, cooperative and no distress Resp: clear to auscultation bilaterally Cardio: regular rate and rhythm, S1, S2 normal, no murmur, click, rub or gallop GI: soft, non-tender; bowel sounds normal; no masses,  no organomegaly Extremities: extremities normal, atraumatic, no cyanosis or edema  Lab Results:  Results for orders placed or performed during the hospital encounter of 01/30/19 (from the past 48 hour(s))  Protime-INR     Status: None   Collection Time: 01/30/19  9:48 PM  Result Value Ref Range   Prothrombin Time 12.9 11.4 - 15.2 seconds   INR 1.0 0.8 - 1.2    Comment: (NOTE) INR goal varies based on device and disease states. Performed at [facility], [address] [place], [place] [address]   APTT     Status: None   Collection Time: 01/30/19  9:48 PM  Result Value Ref Range   aPTT 29 24 - 36 seconds    Comment: Performed at [facility], [address] [place], [place] [address]  CBC     Status: None   Collection Time: 01/30/19  9:48 PM  Result Value Ref Range   WBC 8.1 4.0 - 10.5 K/uL   RBC 5.64 4.22 - 5.81 MIL/uL   Hemoglobin 16.4 13.0 - 17.0 g/dL   HCT 09.8 11.9 - 14.7 %   MCV 87.4 80.0 - 100.0 fL   MCH 29.1 26.0 - 34.0 pg   MCHC 33.3 30.0 -  36.0 g/dL   RDW 82.9 56.2 - 13.0 %   Platelets 283 150 - 400 K/uL   nRBC 0.0 0.0 - 0.2 %    Comment: Performed at [facility], [address] [place], [place] [address]  Differential     Status: None   Collection Time: 01/30/19  9:48 PM  Result Value Ref Range   Neutrophils Relative % 56 %   Neutro Abs 4.6 1.7 - 7.7 K/uL   Lymphocytes Relative 31 %   Lymphs Abs 2.5 0.7 - 4.0 K/uL   Monocytes Relative 8 %   Monocytes Absolute 0.7 0.1 - 1.0 K/uL   Eosinophils Relative 4 %   Eosinophils Absolute 0.3 0.0 - 0.5 K/uL   Basophils Relative 1 %   Basophils Absolute 0.1 0.0 - 0.1 K/uL   Immature Granulocytes 0 %   Abs Immature Granulocytes 0.03 0.00 - 0.07 K/uL    Comment: Performed at [facility], [address], [place], [place] [address]  Comprehensive metabolic panel     Status: Abnormal   Collection Time: 01/30/19  9:48 PM  Result Value Ref Range   Sodium 133 (L) 135 - 145 mmol/L   Potassium 3.7 3.5 - 5.1 mmol/L   Chloride 96 (L) 98 - 111 mmol/L   CO2  24 22 - 32 mmol/L   Glucose, Bld 389 (H) 70 - 99 mg/dL   BUN 31 (H) 8 - 23 mg/dL   Creatinine, Ser 3.82 (H) 0.61 - 1.24 mg/dL   Calcium 9.5 8.9 - 50.5 mg/dL   Total Protein 7.3 6.5 - 8.1 g/dL   Albumin 4.3 3.5 - 5.0 g/dL   AST 29 15 - 41 U/L   ALT 29 0 - 44 U/L   Alkaline Phosphatase 75 38 - 126 U/L   Total Bilirubin 0.7 0.3 - 1.2 mg/dL   GFR calc non Af Amer 37 (L) >60 mL/min   GFR calc Af Amer 43 (L) >60 mL/min   Anion gap 13 5 - 15    Comment: Performed at [facility], [address] [place], [place] [address]  Lipase, blood     Status: None   Collection Time: 01/30/19  9:48 PM  Result Value Ref Range   Lipase 51 11 - 51 U/L    Comment: Performed at [facility], [address] [place], [place] [address]  Ethanol     Status: None   Collection Time: 01/30/19  9:48 PM  Result Value Ref Range   Alcohol, Ethyl (B) <10 <10 mg/dL    Comment: (NOTE) Lowest detectable limit for serum alcohol is 10 mg/dL. For medical purposes  only. Performed at [facility], [address] [place], [place] [address]   Urine rapid drug screen (hosp performed)     Status: None   Collection Time: 01/30/19 11:34 PM  Result Value Ref Range   Opiates NONE DETECTED NONE DETECTED   Cocaine NONE DETECTED NONE DETECTED   Benzodiazepines NONE DETECTED NONE DETECTED   Amphetamines NONE DETECTED NONE DETECTED   Tetrahydrocannabinol NONE DETECTED NONE DETECTED   Barbiturates NONE DETECTED NONE DETECTED    Comment: (NOTE) DRUG SCREEN FOR MEDICAL PURPOSES ONLY.  IF CONFIRMATION IS NEEDED FOR ANY PURPOSE, NOTIFY LAB WITHIN 5 DAYS. LOWEST DETECTABLE LIMITS FOR URINE DRUG SCREEN Drug Class                     Cutoff (ng/mL) Amphetamine and metabolites    1000 Barbiturate and metabolites    200 Benzodiazepine                 200 Tricyclics and metabolites     300 Opiates and metabolites        300 Cocaine and metabolites        300 THC                            50 Performed at [facility], [address] [place], [place] [address]   Urinalysis, Routine w reflex microscopic     Status: Abnormal   Collection Time: 01/30/19 11:34 PM  Result Value Ref Range   Color, Urine YELLOW YELLOW   APPearance CLEAR CLEAR   Specific Gravity, Urine 1.012 1.005 - 1.030   pH 5.0 5.0 - 8.0   Glucose, UA >=500 (A) NEGATIVE mg/dL   Hgb urine dipstick NEGATIVE NEGATIVE   Bilirubin Urine NEGATIVE NEGATIVE   Ketones, ur NEGATIVE NEGATIVE mg/dL   Protein, ur NEGATIVE NEGATIVE mg/dL   Nitrite NEGATIVE NEGATIVE   Leukocytes,Ua NEGATIVE NEGATIVE   RBC / HPF 0-5 0 - 5 RBC/hpf   WBC, UA 0-5 0 - 5 WBC/hpf   Bacteria, UA NONE SEEN NONE SEEN   Mucus PRESENT    Hyaline Casts, UA  PRESENT     Comment: Performed at [facility], [address], [place], [place] [address]  CBG monitoring, ED     Status: Abnormal   Collection Time: 01/31/19  1:52 AM  Result Value Ref Range   Glucose-Capillary 242 (H) 70 - 99 mg/dL  Lipid panel     Status: Abnormal    Collection Time: 01/31/19  4:44 AM  Result Value Ref Range   Cholesterol 150 0 - 200 mg/dL   Triglycerides 257 (H) <150 mg/dL   HDL 36 (L) >40 mg/dL   Total CHOL/HDL Ratio 4.2 RATIO   VLDL 51 (H) 0 - 40 mg/dL   LDL Cholesterol 63 0 - 99 mg/dL    Comment:        Total Cholesterol/HDL:CHD Risk Coronary Heart Disease Risk Table                     Men   Women  1/2 Average Risk   3.4   3.3  Average Risk       5.0   4.4  2 X Average Risk   9.6   7.1  3 X Average Risk  23.4   11.0        Use the calculated Patient Ratio above and the CHD Risk Table to determine the patient's CHD Risk.        ATP III CLASSIFICATION (LDL):  <100     mg/dL   Optimal  100-129  mg/dL   Near or Above                    Optimal  130-159  mg/dL   Borderline  160-189  mg/dL   High  >190     mg/dL   Very High Performed at [address] [place], [place] [address]     ABGS No results for input(s): PHART, PO2ART, TCO2, HCO3 in the last 72 hours.  Invalid input(s): PCO2 CULTURES No results found for this or any previous visit (from the past 240 hour(s)). Studies/Results: Ct Head Wo Contrast  Result Date: 01/30/2019 CLINICAL DATA:  Altered level of consciousness, unexplained EXAM: CT HEAD WITHOUT CONTRAST TECHNIQUE: Contiguous axial images were obtained from the base of the skull through the vertex without intravenous contrast. COMPARISON:  None. FINDINGS: Brain: Wedge-shaped region of hypoattenuation involving the cortex and white matter of the right parieto-occipital region extending from the posterior horn lateral ventricle to the cortex, involving the MCA/PCA watershed distribution. No evidence of acute intracranial hemorrhage. No mass effect or midline shift. No extra-axial collection. Background of mild parenchymal volume loss and chronic microvascular ischemic angiopathy. Vascular: Atherosclerotic calcification of the carotid siphons and intradural vertebral arteries. No hyperdense vessel.  Skull: No calvarial fracture or suspicious osseous lesion. No scalp swelling or hematoma. Sinuses/Orbits: Paranasal sinuses and mastoid air cells are predominantly clear. Included orbital structures are unremarkable. Other: None IMPRESSION: 1. Wedge-shaped region of hypoattenuation involving the cortex and white matter of the right parieto-occipital region extending from the posterior horn lateral ventricle to the cortex, involving the MCA/PCA watershed distribution, consistent with acute/subacute infarct. No evidence of acute intracranial hemorrhage. 2. Mild parenchymal volume loss and chronic microvascular ischemic angiopathy. These results were called by telephone at the time of interpretation on 01/30/2019 at 10:43 pm to provider JOSHUA LONG , who verbally acknowledged these results. Electronically Signed   By: Lovena Le M.D.   On: 01/30/2019 22:46    Medications:  Prior to Admission: (Not  in a hospital admission)  Scheduled: .  stroke: mapping our early stages of recovery book   Does not apply Once  . amitriptyline  75 mg Oral QHS  . aspirin EC  81 mg Oral Daily  . clopidogrel  75 mg Oral Daily  . heparin  5,000 Units Subcutaneous Q8H  . insulin aspart  0-5 Units Subcutaneous QHS  . insulin aspart  0-9 Units Subcutaneous TID WC  . insulin detemir  20 Units Subcutaneous BID  . ondansetron  4 mg Intravenous Once  . rosuvastatin  5 mg Oral q1800  . sodium chloride flush  3 mL Intravenous Q12H   Continuous: . sodium chloride    . sodium chloride 100 mL/hr at 01/31/19 0104   UJW:JXBJYNPRN:sodium chloride, acetaminophen **OR** acetaminophen (TYLENOL) oral liquid 160 mg/5 mL **OR** acetaminophen, calcium carbonate, HYDROcodone-acetaminophen, senna-docusate, sodium chloride flush  Assesment: He has what appears to be an ischemic stroke.  Work-up is underway.  He will have MRI neurology consultation carotid Doppler echocardiogram.  He will have PT OT and speech consultations.  He did pass bedside  swallow evaluation  He has hypertension which is well controlled  He has coronary disease but no chest pain  His renal function is worse than baseline and repeat basic metabolic profile is pending this morning.  He has diabetes and is on sliding scale Principal Problem:   Ischemic stroke (HCC) Active Problems:   Type 2 diabetes mellitus with complication, with long-term current use of insulin [facility](HCC)   Essential hypertension   Coronary Artery Disease   Renal insufficiency    Plan: Continue treatments.  Work-up as above.    LOS: 0 days   Fredirick Maudlindward L Seleste Tallman 01/31/2019, 7:56 AM

## 2019-01-31 NOTE — H&P (Addendum)
History and Physical    [name] TOI:[id number] DOB: 26-Dec-1950 DOA: 01/30/2019  PCP: Sinda Du, MD   Patient coming from: Home   Chief Complaint: Headache, confusion   HPI: [name] is a 68 y.o. male with medical history significant for CAD status post CABG, insulin-dependent diabetes mellitus, hypertension, and history of mild renal insufficiency, now presenting to the emergency department with 2 days of headache and confusion.  Patient reports waking 2 days ago with a right-sided headache after going to bed in his usual state of health the night before.  He does not typically get headaches like this.  The headache was not severe, not particularly bothersome, but he went on to note some confusion, specifically describes difficulty interpreting the numbers on a keypad, and reports similar difficulty when trying to focus on a complex task.  He has not had any fevers, chills, cough, shortness of breath, chest pain, palpitations, change in vision or hearing, or any focal numbness or weakness.  ED Course: Upon arrival to the ED, patient is found to be afebrile, saturating mid 90s on room air, and with remaining vitals also normal.  EKG features a sinus rhythm.  Noncontrast head CT is notable for region of hypoattenuation at the right MCA/PCA territory concerning with acute/subacute watershed infarct without evidence for hemorrhage.  Chemistry panel is notable for glucose of 389 and creatinine 1.85, up from 1.2 over a year ago.  CBC is unremarkable.  COVID-19 testing is in process.  Review of Systems:  All other systems reviewed and apart from HPI, are negative.  Past Medical History:  Diagnosis Date  . CAD (coronary artery disease), native coronary artery    Multiple PCI procedures followed by CABG in 2009 with LIMA-LAD, SVG-D, seq SVG-OM and PLV, SVG-PDA. Echo (3/12): EF 60-65%, mild MR. // Nuc Stress Test 7/19:  EF 50, artifact, no ischemia, low risk   . Carotid stenosis     dopplers 1/14:  0-39% bilat  . Cataract    REMOVED  . Colon polyps 2006   Colonoscopy-Dr. Patterson(Tubular Adenoma)   . DM2 (diabetes mellitus, type 2) ([place])    Followed by Dr. Carlis Abbott  . History of echocardiogram    Echo 7/19: Mild LVH, EF 83-38, normal diastolic function, trivial AI, moderate LAE  . HLD (hyperlipidemia)   . HTN (hypertension)   . Hx of cardiovascular stress test    ETT-Myoview (03/2013):  Anteroseptal reversible defect, EF 54%, intermediate risk.  [name] Postoperative atrial fibrillation ([place]) 2009  . Ulcer    left foot    Past Surgical History:  Procedure Laterality Date  . APPENDECTOMY  2005  . BACK SURGERY  2006  . CARDIAC CATHETERIZATION  2009   EF 60%  . COLONOSCOPY    . CORONARY ARTERY BYPASS GRAFT  2009   post op. a fib, LIMA -LAD, SVG-D,seq SVG-OM and PLV,  SVG-PDA.  . TONSILLECTOMY       reports that he has never smoked. He quit smokeless tobacco use about 25 years ago.  His smokeless tobacco use included chew. He reports that he does not drink alcohol or use drugs.  Allergies  Allergen Reactions  . Penicillins Other (See Comments)    Other reaction(s): Other (See Comments) Unknown-Reaction as child Unknown-Reaction as child    Family History  Problem Relation Age of Onset  . Coronary artery disease Other        family history  . Heart disease Other  family history  . Other Brother        CABG  . Other Brother        CABG  . Prostate cancer Father   . Diabetes Father   . Prostate cancer Brother   . Diabetes Brother        x 2  . Colon cancer Mother      Prior to Admission medications   Medication Sig Start Date End Date Taking? Authorizing Provider  amitriptyline (ELAVIL) 75 MG tablet Take 75 mg by mouth at bedtime.   Yes [provider]  amLODipine (NORVASC) 10 MG tablet Take 1 tablet (10 mg total) by mouth daily. Please make overdue appt with Dr. Excell Seltzer before anymore refills. 2nd attempt Patient taking  differently: Take 10 mg by mouth daily.  01/20/19  Yes Tereso Newcomer T, PA-C  aspirin 325 MG EC tablet Take 325 mg by mouth once.   Yes [provider]  aspirin EC 81 MG tablet Take 1 tablet (81 mg total) by mouth daily. 08/24/13  Yes Laurey Morale, MD  clopidogrel (PLAVIX) 75 MG tablet Take 1 tablet (75 mg total) by mouth daily. Please make overdue appt with Dr. Excell Seltzer before anymore refills. 2nd attempt Patient taking differently: Take 75 mg by mouth daily.  01/20/19  Yes Weaver, Scott T, PA-C  ezetimibe (ZETIA) 10 MG tablet Take 1 tablet (10 mg total) by mouth daily. Please make overdue appt with Dr. Excell Seltzer before anymore refills. 1st attempt Patient taking differently: Take 10 mg by mouth daily.  01/18/19  Yes Weaver, Scott T, PA-C  fenofibrate 54 MG tablet Take 1 tablet (54 mg total) by mouth daily. Please call [phone] to schedule a f/u to continue refills. Thank you. 1st attmpt Patient taking differently: Take 54 mg by mouth daily.  01/12/19  Yes Weaver, Scott T, PA-C  furosemide (LASIX) 40 MG tablet Take 1 tablet (40 mg total) by mouth daily. 12/10/17  Yes Weaver, Scott T, PA-C  metFORMIN (GLUCOPHAGE) 1000 MG tablet Take 1,000 mg by mouth 2 (two) times daily with a meal.   Yes [provider]  metoprolol tartrate (LOPRESSOR) 100 MG tablet Take 1 tablet (100 mg total) by mouth 2 (two) times daily. Please make overdue appt with Dr. Excell Seltzer before anymore refills. 1st attempt Patient taking differently: Take 100 mg by mouth 2 (two) times daily.  01/18/19  Yes Weaver, Scott T, PA-C  nitroGLYCERIN (NITROSTAT) 0.4 MG SL tablet Place 1 tablet (0.4 mg total) under the tongue every 5 (five) minutes as needed for chest pain. 12/10/17 01/30/19 Yes Weaver, Scott T, PA-C  NOVOLOG MIX 70/30 FLEXPEN (70-30) 100 UNIT/ML Pen Inject 40-60 Units into the skin 2 (two) times daily. Per sliding scale 07/10/13  Yes [provider]  rosuvastatin (CRESTOR) 5 MG tablet Take 1 tablet (5 mg  total) by mouth daily. Please make overdue appt with Dr. Excell Seltzer before anymore refills. 1st attempt Patient taking differently: Take 5 mg by mouth daily.  01/18/19  Yes Weaver, Scott T, PA-C  VICTOZA 18 MG/3ML SOPN Inject 1.8 mg into the skin daily.  03/18/15  Yes [provider]    Physical Exam: Vitals:   01/30/19 2154 01/30/19 2215 01/30/19 2230 01/31/19 0000  BP:  122/75 128/63 138/76  Pulse: 70 71 71 69  Resp: 17 14 10 18   Temp:      SpO2: 95% 95% 93% 92%  Weight:      Height:  Constitutional: NAD, calm  Eyes: PERTLA, lids and conjunctivae normal ENMT: Mucous membranes are moist. Posterior pharynx clear of any exudate or lesions.   Neck: normal, supple, no masses, no thyromegaly Respiratory: no wheezing, no crackles. Normal respiratory effort. No accessory muscle use.  Cardiovascular: S1 & S2 heard, regular rate and rhythm. No extremity edema.   Abdomen: No distension, no tenderness, soft. Bowel sounds normal.  Musculoskeletal: no clubbing / cyanosis. No joint deformity upper and lower extremities.  Skin: no significant rashes, lesions, ulcers. Warm, dry, well-perfused. Neurologic: CN 2-12 grossly intact. Sensation intact, DTR normal. Strength 5/5 in all 4 limbs.  Psychiatric: Alert and oriented x 3. Calm, cooperative.    Labs on Admission: I have personally reviewed following labs and imaging studies  CBC: Recent Labs  Lab 01/30/19 2148  WBC 8.1  NEUTROABS 4.6  HGB 16.4  HCT 49.3  MCV 87.4  PLT 283   Basic Metabolic Panel: Recent Labs  Lab 01/30/19 2148  NA 133*  K 3.7  CL 96*  CO2 24  GLUCOSE 389*  BUN 31*  CREATININE 1.85*  CALCIUM 9.5   GFR: Estimated Creatinine Clearance: 48.6 mL/min (A) (by C-G formula based on SCr of 1.85 mg/dL (H)). Liver Function Tests: Recent Labs  Lab 01/30/19 2148  AST 29  ALT 29  ALKPHOS 75  BILITOT 0.7  PROT 7.3  ALBUMIN 4.3   Recent Labs  Lab 01/30/19 2148  LIPASE 51   No results for  input(s): AMMONIA in the last 168 hours. Coagulation Profile: Recent Labs  Lab 01/30/19 2148  INR 1.0   Cardiac Enzymes: No results for input(s): CKTOTAL, CKMB, CKMBINDEX, TROPONINI in the last 168 hours. BNP (last 3 results) No results for input(s): PROBNP in the last 8760 hours. HbA1C: No results for input(s): HGBA1C in the last 72 hours. CBG: No results for input(s): GLUCAP in the last 168 hours. Lipid Profile: No results for input(s): CHOL, HDL, LDLCALC, TRIG, CHOLHDL, LDLDIRECT in the last 72 hours. Thyroid Function Tests: No results for input(s): TSH, T4TOTAL, FREET4, T3FREE, THYROIDAB in the last 72 hours. Anemia Panel: No results for input(s): VITAMINB12, FOLATE, FERRITIN, TIBC, IRON, RETICCTPCT in the last 72 hours. Urine analysis:    Component Value Date/Time   COLORURINE YELLOW 01/30/2019 2334   APPEARANCEUR CLEAR 01/30/2019 2334   LABSPEC 1.012 01/30/2019 2334   PHURINE 5.0 01/30/2019 2334   GLUCOSEU >=500 (A) 01/30/2019 2334   HGBUR NEGATIVE 01/30/2019 2334   BILIRUBINUR NEGATIVE 01/30/2019 2334   KETONESUR NEGATIVE 01/30/2019 2334   PROTEINUR NEGATIVE 01/30/2019 2334   UROBILINOGEN 0.2 10/02/2011 1907   NITRITE NEGATIVE 01/30/2019 2334   LEUKOCYTESUR NEGATIVE 01/30/2019 2334   Sepsis Labs: (procalcitonin:4,lacticidven:4) )No results found for this or any previous visit (from the past 240 hour(s)).   Radiological Exams on Admission: Ct Head Wo Contrast  Result Date: 01/30/2019 CLINICAL DATA:  Altered level of consciousness, unexplained EXAM: CT HEAD WITHOUT CONTRAST TECHNIQUE: Contiguous axial images were obtained from the base of the skull through the vertex without intravenous contrast. COMPARISON:  None. FINDINGS: Brain: Wedge-shaped region of hypoattenuation involving the cortex and white matter of the right parieto-occipital region extending from the posterior horn lateral ventricle to the cortex, involving the MCA/PCA watershed distribution.  No evidence of acute intracranial hemorrhage. No mass effect or midline shift. No extra-axial collection. Background of mild parenchymal volume loss and chronic microvascular ischemic angiopathy. Vascular: Atherosclerotic calcification of the carotid siphons and intradural vertebral arteries. No hyperdense vessel. Skull: No calvarial  fracture or suspicious osseous lesion. No scalp swelling or hematoma. Sinuses/Orbits: Paranasal sinuses and mastoid air cells are predominantly clear. Included orbital structures are unremarkable. Other: None IMPRESSION: 1. Wedge-shaped region of hypoattenuation involving the cortex and white matter of the right parieto-occipital region extending from the posterior horn lateral ventricle to the cortex, involving the MCA/PCA watershed distribution, consistent with acute/subacute infarct. No evidence of acute intracranial hemorrhage. 2. Mild parenchymal volume loss and chronic microvascular ischemic angiopathy. These results were called by telephone at the time of interpretation on 01/30/2019 at 10:43 pm to provider JOSHUA LONG , who verbally acknowledged these results. Electronically Signed   By: Kreg ShropshirePrice  DeHay M.D.   On: 01/30/2019 22:46    EKG: Independently reviewed. Sinus rhythm, rate 68, QTc 435 ms.   Assessment/Plan   1. Acute/subacute CVA  - Presents with 2 days of headache and mild confusion, and is found to have hypoattenuating region on head CT concerning for watershed infarct involving right MCA/PCA territory  - Continue neuro checks, consult PT/OT/SLP, NPO pending swallow screen  - Check MRI brain, carotid US, echocardiogram, A1c, and fasting lipids  - Continue cardiac monitoring, hold antihypertensives given concern for watershed infarct, continue ASA, Plavix, and statin, and consult with neurology    2. Renal insufficiency  - SCr is 1.85 on admission, up from 1.2 in July 2019 with no more recent values available  - Check renal US and urine chemistries, hold  Lasix, hydrate gently with NS overnight, repeat chem panel in am    3. CAD  - No anginal complaints  - Continue DAPT and statin   4. Insulin-dependent DM  - No recent A1c on file, serum glucose 389 in ED  - Managed at home with metformin, Victoza, and Novlog 70/30 mix  - Update A1c, check CBG's and use Lantus with Novolog correctional while in hospital     PPE: Mask, face shield  DVT prophylaxis: sq heparin  Code Status: Full  Family Communication: Discussed with patient  Consults called: Neurology consultation planned  Admission status: Observation     Briscoe Deutscherimothy S Soley Harriss, MD [facility] Pager [phone]  If 7PM-7AM, please contact night-coverage [email or web address] Password [email or web address]  01/31/2019, 12:55 AM

## 2019-02-01 ENCOUNTER — Inpatient Hospital Stay (HOSPITAL_COMMUNITY): Payer: Medicare Other

## 2019-02-01 DIAGNOSIS — I361 Nonrheumatic tricuspid (valve) insufficiency: Secondary | ICD-10-CM

## 2019-02-01 LAB — ECHOCARDIOGRAM COMPLETE
Height: 70 in
Weight: 3760 oz

## 2019-02-01 LAB — GLUCOSE, CAPILLARY
Glucose-Capillary: 255 mg/dL — ABNORMAL HIGH (ref 70–99)
Glucose-Capillary: 277 mg/dL — ABNORMAL HIGH (ref 70–99)

## 2019-02-01 LAB — UREA NITROGEN, URINE: Urea Nitrogen, Ur: 391 mg/dL

## 2019-02-01 MED ORDER — ROSUVASTATIN CALCIUM 20 MG PO TABS: 40.0000 mg | ORAL_TABLET | Freq: Every day | ORAL | Status: DC

## 2019-02-01 MED ORDER — ROSUVASTATIN CALCIUM 40 MG PO TABS: 40.0000 mg | ORAL_TABLET | Freq: Every day | ORAL | 5 refills | Status: DC

## 2019-02-01 MED ORDER — ASPIRIN EC 325 MG PO TBEC
325.0000 mg | DELAYED_RELEASE_TABLET | Freq: Every day | ORAL | Status: DC
  Administered 2019-02-01: 325 mg via ORAL
  Filled 2019-02-01: qty 1

## 2019-02-01 MED ORDER — ASPIRIN 325 MG PO TBEC: 325.0000 mg | DELAYED_RELEASE_TABLET | Freq: Every day | ORAL | 0 refills | Status: DC

## 2019-02-01 NOTE — Plan of Care (Signed)
  Problem: Education: Goal: Knowledge of General Education information will improve Description: Including pain rating scale, medication(s)/side effects and non-pharmacologic comfort measures 02/01/2019 1352 by Santa Lighter, RN Outcome: Adequate for Discharge 02/01/2019 1347 by Santa Lighter, RN Outcome: Progressing   Problem: Health Behavior/Discharge Planning: Goal: Ability to manage health-related needs will improve 02/01/2019 1352 by Santa Lighter, RN Outcome: Adequate for Discharge 02/01/2019 1347 by Santa Lighter, RN Outcome: Progressing   Problem: Clinical Measurements: Goal: Ability to maintain clinical measurements within normal limits will improve 02/01/2019 1352 by Santa Lighter, RN Outcome: Adequate for Discharge 02/01/2019 1347 by Santa Lighter, RN Outcome: Progressing Goal: Will remain free from infection 02/01/2019 1352 by Santa Lighter, RN Outcome: Adequate for Discharge 02/01/2019 1347 by Santa Lighter, RN Outcome: Progressing Goal: Diagnostic test results will improve Outcome: Adequate for Discharge Goal: Respiratory complications will improve Outcome: Adequate for Discharge Goal: Cardiovascular complication will be avoided Outcome: Adequate for Discharge   Problem: Activity: Goal: Risk for activity intolerance will decrease Outcome: Adequate for Discharge   Problem: Nutrition: Goal: Adequate nutrition will be maintained Outcome: Adequate for Discharge   Problem: Coping: Goal: Level of anxiety will decrease Outcome: Adequate for Discharge   Problem: Elimination: Goal: Will not experience complications related to bowel motility Outcome: Adequate for Discharge Goal: Will not experience complications related to urinary retention Outcome: Adequate for Discharge   Problem: Elimination: Goal: Will not experience complications related to bowel motility Outcome: Adequate for Discharge Goal: Will not experience complications related to urinary  retention Outcome: Adequate for Discharge   Problem: Pain Managment: Goal: General experience of comfort will improve Outcome: Adequate for Discharge   Problem: Safety: Goal: Ability to remain free from injury will improve Outcome: Adequate for Discharge   Problem: Skin Integrity: Goal: Risk for impaired skin integrity will decrease Outcome: Adequate for Discharge   Problem: Education: Goal: Knowledge of disease or condition will improve 02/01/2019 1352 by Santa Lighter, RN Outcome: Adequate for Discharge 02/01/2019 1347 by Santa Lighter, RN Outcome: Progressing Goal: Knowledge of secondary prevention will improve 02/01/2019 1352 by Santa Lighter, RN Outcome: Adequate for Discharge 02/01/2019 1347 by Santa Lighter, RN Outcome: Progressing Goal: Knowledge of patient specific risk factors addressed and post discharge goals established will improve 02/01/2019 1352 by Santa Lighter, RN Outcome: Adequate for Discharge 02/01/2019 1347 by Santa Lighter, RN Outcome: Progressing Goal: Individualized Educational Video(s) Outcome: Adequate for Discharge   Problem: Coping: Goal: Will verbalize positive feelings about self 02/01/2019 1352 by Santa Lighter, RN Outcome: Adequate for Discharge 02/01/2019 1347 by Santa Lighter, RN Outcome: Progressing Goal: Will identify appropriate support needs 02/01/2019 1352 by Santa Lighter, RN Outcome: Adequate for Discharge 02/01/2019 1347 by Santa Lighter, RN Outcome: Progressing   Problem: Health Behavior/Discharge Planning: Goal: Ability to manage health-related needs will improve 02/01/2019 1352 by Santa Lighter, RN Outcome: Adequate for Discharge 02/01/2019 1347 by Santa Lighter, RN Outcome: Progressing   Problem: Self-Care: Goal: Ability to participate in self-care as condition permits will improve Outcome: Adequate for Discharge Goal: Verbalization of feelings and concerns over difficulty with self-care will  improve Outcome: Adequate for Discharge Goal: Ability to communicate needs accurately will improve Outcome: Adequate for Discharge   Problem: Nutrition: Goal: Risk of aspiration will decrease Outcome: Adequate for Discharge Goal: Dietary intake will improve Outcome: Adequate for Discharge   Problem: Ischemic Stroke/TIA Tissue Perfusion: Goal: Complications of ischemic stroke/TIA will be minimized Outcome: Adequate for Discharge

## 2019-02-01 NOTE — Progress Notes (Signed)
Subjective: He says he feels better.  No complaints.  He still has a headache but it is better.  No other neurological symptoms.  Objective: Vital signs in last 24 hours: Temp:  [98.1 F (36.7 C)-98.3 F (36.8 C)] 98.1 F (36.7 C) (10/28 0450) Pulse Rate:  [66-75] 75 (10/28 0800) Resp:  [16-27] 17 (10/28 0800) BP: (105-158)/(60-88) 140/71 (10/28 0800) SpO2:  [91 %-95 %] 95 % (10/28 0800) Weight change:  Last BM Date: 01/29/19  Intake/Output from previous day: 10/27 0701 - 10/28 0700 In: 663.9 [I.V.:663.9] Out: 2000 [Urine:2000]  PHYSICAL EXAM General appearance: alert, cooperative and no distress Resp: clear to auscultation bilaterally Cardio: regular rate and rhythm, S1, S2 normal, no murmur, click, rub or gallop GI: soft, non-tender; bowel sounds normal; no masses,  no organomegaly Extremities: extremities normal, atraumatic, no cyanosis or edema  Lab Results:  Results for orders placed or performed during the hospital encounter of 01/30/19 (from the past 48 hour(s))  Protime-INR     Status: None   Collection Time: 01/30/19  9:48 PM  Result Value Ref Range   Prothrombin Time 12.9 11.4 - 15.2 seconds   INR 1.0 0.8 - 1.2    Comment: (NOTE) INR goal varies based on device and disease states. Performed at [facility], [address] [place], [place] [address]   APTT     Status: None   Collection Time: 01/30/19  9:48 PM  Result Value Ref Range   aPTT 29 24 - 36 seconds    Comment: Performed at [facility], [address] [place], [place] [address]  CBC     Status: None   Collection Time: 01/30/19  9:48 PM  Result Value Ref Range   WBC 8.1 4.0 - 10.5 K/uL   RBC 5.64 4.22 - 5.81 MIL/uL   Hemoglobin 16.4 13.0 - 17.0 g/dL   HCT 36.6 44.0 - 34.7 %   MCV 87.4 80.0 - 100.0 fL   MCH 29.1 26.0 - 34.0 pg   MCHC 33.3 30.0 - 36.0 g/dL   RDW 42.5 95.6 - 38.7 %   Platelets 283 150 - 400 K/uL   nRBC 0.0 0.0 - 0.2 %    Comment: Performed at [facility], [address]  [place], [place] [address]  Differential     Status: None   Collection Time: 01/30/19  9:48 PM  Result Value Ref Range   Neutrophils Relative % 56 %   Neutro Abs 4.6 1.7 - 7.7 K/uL   Lymphocytes Relative 31 %   Lymphs Abs 2.5 0.7 - 4.0 K/uL   Monocytes Relative 8 %   Monocytes Absolute 0.7 0.1 - 1.0 K/uL   Eosinophils Relative 4 %   Eosinophils Absolute 0.3 0.0 - 0.5 K/uL   Basophils Relative 1 %   Basophils Absolute 0.1 0.0 - 0.1 K/uL   Immature Granulocytes 0 %   Abs Immature Granulocytes 0.03 0.00 - 0.07 K/uL    Comment: Performed at [facility], [address], [place], [place] [address]  Comprehensive metabolic panel     Status: Abnormal   Collection Time: 01/30/19  9:48 PM  Result Value Ref Range   Sodium 133 (L) 135 - 145 mmol/L   Potassium 3.7 3.5 - 5.1 mmol/L   Chloride 96 (L) 98 - 111 mmol/L   CO2 24 22 - 32 mmol/L   Glucose, Bld 389 (H) 70 - 99 mg/dL   BUN 31 (H) 8 - 23 mg/dL   Creatinine, Ser 8.41 (H)  0.61 - 1.24 mg/dL   Calcium 9.5 8.9 - 40.9 mg/dL   Total Protein 7.3 6.5 - 8.1 g/dL   Albumin 4.3 3.5 - 5.0 g/dL   AST 29 15 - 41 U/L   ALT 29 0 - 44 U/L   Alkaline Phosphatase 75 38 - 126 U/L   Total Bilirubin 0.7 0.3 - 1.2 mg/dL   GFR calc non Af Amer 37 (L) >60 mL/min   GFR calc Af Amer 43 (L) >60 mL/min   Anion gap 13 5 - 15    Comment: Performed at [facility], [address], [place], [place] [address]  Lipase, blood     Status: None   Collection Time: 01/30/19  9:48 PM  Result Value Ref Range   Lipase 51 11 - 51 U/L    Comment: Performed at [facility], [address], [place], [place] [address]  Ethanol     Status: None   Collection Time: 01/30/19  9:48 PM  Result Value Ref Range   Alcohol, Ethyl (B) <10 <10 mg/dL    Comment: (NOTE) Lowest detectable limit for serum alcohol is 10 mg/dL. For medical purposes only. Performed at [facility], [address] [place], [place] [address]   Hemoglobin A1c     Status: Abnormal   Collection Time: 01/30/19  9:48  PM  Result Value Ref Range   Hgb A1c MFr Bld 9.2 (H) 4.8 - 5.6 %    Comment: (NOTE) Pre diabetes:          5.7%-6.4% Diabetes:              >6.4% Glycemic control for   <7.0% adults with diabetes    Mean Plasma Glucose 217.34 mg/dL    Comment: Performed at [facility], [address] [place], [place] [address]  SARS CORONAVIRUS 2 (TAT 6-24 HRS) Nasopharyngeal Nasopharyngeal Swab     Status: None   Collection Time: 01/30/19 11:02 PM   Specimen: Nasopharyngeal Swab  Result Value Ref Range   SARS Coronavirus 2 NEGATIVE NEGATIVE    Comment: (NOTE) SARS-CoV-2 target nucleic acids are NOT DETECTED. The SARS-CoV-2 RNA is generally detectable in upper and lower respiratory specimens during the acute phase of infection. Negative results do not preclude SARS-CoV-2 infection, do not rule out co-infections with other pathogens, and should not be used as the sole basis for treatment or other patient management decisions. Negative results must be combined with clinical observations, patient history, and epidemiological information. The expected result is Negative. Fact Sheet for Patients: HairSlick.no Fact Sheet for Healthcare Providers: quierodirigir.com This test is not yet approved or cleared by the [place] FDA and  has been authorized for detection and/or diagnosis of SARS-CoV-2 by FDA under an Emergency Use Authorization (EUA). This EUA will remain  in effect (meaning this test can be used) for the duration of the COVID-19 declaration under Section 56 4(b)(1) of the Act, 21 U.S.C. section 360bbb-3(b)(1), unless the authorization is terminated or revoked sooner. Performed at [facility], [address], [place], [place] [address]   Urine rapid drug screen (hosp performed)     Status: None   Collection Time: 01/30/19 11:34 PM  Result Value Ref Range   Opiates NONE DETECTED NONE DETECTED   Cocaine NONE DETECTED  NONE DETECTED   Benzodiazepines NONE DETECTED NONE DETECTED   Amphetamines NONE DETECTED NONE DETECTED   Tetrahydrocannabinol NONE DETECTED NONE DETECTED   Barbiturates NONE DETECTED NONE DETECTED    Comment: (NOTE)  DRUG SCREEN FOR MEDICAL PURPOSES ONLY.  IF CONFIRMATION IS NEEDED FOR ANY PURPOSE, NOTIFY LAB WITHIN 5 DAYS. LOWEST DETECTABLE LIMITS FOR URINE DRUG SCREEN Drug Class                     Cutoff (ng/mL) Amphetamine and metabolites    1000 Barbiturate and metabolites    200 Benzodiazepine                 200 Tricyclics and metabolites     300 Opiates and metabolites        300 Cocaine and metabolites        300 THC                            50 Performed at [facility], [address] [place], [place] [address]   Urinalysis, Routine w reflex microscopic     Status: Abnormal   Collection Time: 01/30/19 11:34 PM  Result Value Ref Range   Color, Urine YELLOW YELLOW   APPearance CLEAR CLEAR   Specific Gravity, Urine 1.012 1.005 - 1.030   pH 5.0 5.0 - 8.0   Glucose, UA >=500 (A) NEGATIVE mg/dL   Hgb urine dipstick NEGATIVE NEGATIVE   Bilirubin Urine NEGATIVE NEGATIVE   Ketones, ur NEGATIVE NEGATIVE mg/dL   Protein, ur NEGATIVE NEGATIVE mg/dL   Nitrite NEGATIVE NEGATIVE   Leukocytes,Ua NEGATIVE NEGATIVE   RBC / HPF 0-5 0 - 5 RBC/hpf   WBC, UA 0-5 0 - 5 WBC/hpf   Bacteria, UA NONE SEEN NONE SEEN   Mucus PRESENT    Hyaline Casts, UA PRESENT     Comment: Performed at [facility], [address] [place], [place] [address]  Sodium, urine, random     Status: None   Collection Time: 01/30/19 11:34 PM  Result Value Ref Range   Sodium, Ur 59 mmol/L    Comment: Performed at [facility], [address] [place], [place] [address]  Creatinine, urine, random     Status: None   Collection Time: 01/30/19 11:34 PM  Result Value Ref Range   Creatinine, Urine 62.25 mg/dL    Comment: Performed at [facility], [address] [place], [place] [address]  CBG monitoring, ED      Status: Abnormal   Collection Time: 01/31/19  1:52 AM  Result Value Ref Range   Glucose-Capillary 242 (H) 70 - 99 mg/dL  HIV Antibody (routine testing w rflx)     Status: None   Collection Time: 01/31/19  4:44 AM  Result Value Ref Range   HIV Screen 4th Generation wRfx NON REACTIVE NON REACTIVE    Comment: Performed at [facility], [address] [place], [place] [address]  Lipid panel     Status: Abnormal   Collection Time: 01/31/19  4:44 AM  Result Value Ref Range   Cholesterol 150 0 - 200 mg/dL   Triglycerides 130257 (H) <150 mg/dL   HDL 36 (L) >86>40 mg/dL   Total CHOL/HDL Ratio 4.2 RATIO   VLDL 51 (H) 0 - 40 mg/dL   LDL Cholesterol 63 0 - 99 mg/dL    Comment:        Total Cholesterol/HDL:CHD Risk Coronary Heart Disease Risk Table                     Men   Women  1/2 Average Risk   3.4   3.3  Average  Risk       5.0   4.4  2 X Average Risk   9.6   7.1  3 X Average Risk  23.4   11.0        Use the calculated Patient Ratio above and the CHD Risk Table to determine the patient's CHD Risk.        ATP III CLASSIFICATION (LDL):  <100     mg/dL   Optimal  782-956  mg/dL   Near or Above                    Optimal  130-159  mg/dL   Borderline  213-086  mg/dL   High  >578     mg/dL   Very High Performed at [facility], [address] [place], [place] [address]   CBG monitoring, ED     Status: Abnormal   Collection Time: 01/31/19  7:59 AM  Result Value Ref Range   Glucose-Capillary 212 (H) 70 - 99 mg/dL  CBG monitoring, ED     Status: Abnormal   Collection Time: 01/31/19 11:22 AM  Result Value Ref Range   Glucose-Capillary 299 (H) 70 - 99 mg/dL  Glucose, capillary     Status: Abnormal   Collection Time: 01/31/19  5:02 PM  Result Value Ref Range   Glucose-Capillary 238 (H) 70 - 99 mg/dL  Glucose, capillary     Status: Abnormal   Collection Time: 01/31/19  8:32 PM  Result Value Ref Range   Glucose-Capillary 312 (H) 70 - 99 mg/dL   Comment 1 Notify RN   Glucose,  capillary     Status: Abnormal   Collection Time: 02/01/19  7:17 AM  Result Value Ref Range   Glucose-Capillary 255 (H) 70 - 99 mg/dL    ABGS No results for input(s): PHART, PO2ART, TCO2, HCO3 in the last 72 hours.  Invalid input(s): PCO2 CULTURES Recent Results (from the past 240 hour(s))  SARS CORONAVIRUS 2 (TAT 6-24 HRS) Nasopharyngeal Nasopharyngeal Swab     Status: None   Collection Time: 01/30/19 11:02 PM   Specimen: Nasopharyngeal Swab  Result Value Ref Range Status   SARS Coronavirus 2 NEGATIVE NEGATIVE Final    Comment: (NOTE) SARS-CoV-2 target nucleic acids are NOT DETECTED. The SARS-CoV-2 RNA is generally detectable in upper and lower respiratory specimens during the acute phase of infection. Negative results do not preclude SARS-CoV-2 infection, do not rule out co-infections with other pathogens, and should not be used as the sole basis for treatment or other patient management decisions. Negative results must be combined with clinical observations, patient history, and epidemiological information. The expected result is Negative. Fact Sheet for Patients: HairSlick.no Fact Sheet for Healthcare Providers: quierodirigir.com This test is not yet approved or cleared by the [place] FDA and  has been authorized for detection and/or diagnosis of SARS-CoV-2 by FDA under an Emergency Use Authorization (EUA). This EUA will remain  in effect (meaning this test can be used) for the duration of the COVID-19 declaration under Section 56 4(b)(1) of the Act, 21 U.S.C. section 360bbb-3(b)(1), unless the authorization is terminated or revoked sooner. Performed at [facility], [address] [place], [place] [address]    Studies/Results: Ct Head Wo Contrast  Result Date: 01/30/2019 CLINICAL DATA:  Altered level of consciousness, unexplained EXAM: CT HEAD WITHOUT CONTRAST TECHNIQUE: Contiguous axial images were  obtained from the base of the skull through the vertex without intravenous contrast. COMPARISON:  None. FINDINGS:  Brain: Wedge-shaped region of hypoattenuation involving the cortex and white matter of the right parieto-occipital region extending from the posterior horn lateral ventricle to the cortex, involving the MCA/PCA watershed distribution. No evidence of acute intracranial hemorrhage. No mass effect or midline shift. No extra-axial collection. Background of mild parenchymal volume loss and chronic microvascular ischemic angiopathy. Vascular: Atherosclerotic calcification of the carotid siphons and intradural vertebral arteries. No hyperdense vessel. Skull: No calvarial fracture or suspicious osseous lesion. No scalp swelling or hematoma. Sinuses/Orbits: Paranasal sinuses and mastoid air cells are predominantly clear. Included orbital structures are unremarkable. Other: None IMPRESSION: 1. Wedge-shaped region of hypoattenuation involving the cortex and white matter of the right parieto-occipital region extending from the posterior horn lateral ventricle to the cortex, involving the MCA/PCA watershed distribution, consistent with acute/subacute infarct. No evidence of acute intracranial hemorrhage. 2. Mild parenchymal volume loss and chronic microvascular ischemic angiopathy. These results were called by telephone at the time of interpretation on 01/30/2019 at 10:43 pm to provider JOSHUA LONG , who verbally acknowledged these results. Electronically Signed   By: Kreg Shropshire M.D.   On: 01/30/2019 22:46   Mr Angio Head Wo Contrast  Result Date: 01/31/2019 CLINICAL DATA:  Headache, acute, severe, worse headache of life. Stroke, follow-up. Additional history provided: Headache, acute, severe, worst headache of life. Patient awoke Sunday morning with headache and confusion, states confusion is better but not resolved. EXAM: MRI HEAD WITHOUT CONTRAST MRA HEAD WITHOUT CONTRAST TECHNIQUE: Multiplanar, multiecho  pulse sequences of the brain and surrounding structures were obtained without intravenous contrast. Angiographic images of the head were obtained using MRA technique without contrast. COMPARISON:  Head CT 01/30/2019 FINDINGS: MRI HEAD FINDINGS Brain: Again demonstrated is a moderate-sized acute/early subacute cortical/subcortical infarct within the posterior right MCA vascular territory involving portions of the right parietal, occipital and temporal lobes. There is a separate 13 mm acute/early subacute cortical infarct within the high posterior right parietal lobe. There are several additional scattered punctate acute/early subacute cortical infarcts within the right frontoparietal and right temporal lobes. Corresponding T2/FLAIR hyperintensity at these sites. Susceptibility weighted signal loss consistent with petechial hemorrhage in the region of the dominant infarct. There is a background of mild chronic small vessel ischemic disease. No evidence of intracranial mass. No midline shift or extra-axial fluid collection. Mild generalized parenchymal atrophy. Vascular: Reported separately Skull and upper cervical spine: No focal marrow lesion Sinuses/Orbits: Visualized orbits demonstrate no acute abnormality. Trace ethmoid sinus mucosal thickening. Small bilateral mastoid effusions. Incidentally noted Tornwaldt cyst. MRA HEAD FINDING: The intracranial internal carotid arteries are patent without significant proximal stenosis. The bilateral middle and anterior cerebral arteries are patent. Mild focal stenosis within the A1 right anterior cerebral artery. Otherwise no significant stenosis within these vessels. No intracranial aneurysm is identified. The non dominant intracranial right vertebral artery is patent and terminates as the right PICA. The left vertebral artery is patent without significant stenosis and supplies the basilar artery. The basilar artery is patent without significant stenosis. Predominantly fetal  origin of the left posterior cerebral artery. There are sites of apparent mild to moderate segmental stenosis within the bilateral P2 posterior cerebral arteries. There is a sizable right posterior communicating artery. These results were called by telephone at the time of interpretation on 01/31/2019 at 9:30 am to provider Kari Baars , who verbally acknowledged these results. IMPRESSION: MRI brain: 1. Redemonstrated moderate-sized acute/early subacute cortical/subcortical infarct within the posterior right MCA vascular territory. Additional small acute/early subacute cortical infarct within the right parietal  lobe, as well as multiple punctate acute/early subacute cortical infarcts within the right frontoparietal and right temporal lobes. Petechial hemorrhage in the region of the dominant infarct. Question an embolic source within the cervical right ICA. Correlate with findings on scheduled carotid artery duplex. 2. Mild generalized parenchymal atrophy and chronic small vessel ischemic disease. 3. Small bilateral mastoid effusions. MRA head: 1. No intracranial anterior circulation large vessel occlusion or proximal high-grade arterial stenosis. Mild focal stenosis within the A1 right anterior cerebral artery. 2. Atherosclerotic irregularity of the posterior cerebral arteries with sites of apparent mild to moderate segmental stenosis within the P2 segments bilaterally. Electronically Signed   By: Kellie Simmering DO   On: 01/31/2019 09:31   Mr Brain Wo Contrast  Result Date: 01/31/2019 CLINICAL DATA:  Headache, acute, severe, worse headache of life. Stroke, follow-up. Additional history provided: Headache, acute, severe, worst headache of life. Patient awoke Sunday morning with headache and confusion, states confusion is better but not resolved. EXAM: MRI HEAD WITHOUT CONTRAST MRA HEAD WITHOUT CONTRAST TECHNIQUE: Multiplanar, multiecho pulse sequences of the brain and surrounding structures were obtained  without intravenous contrast. Angiographic images of the head were obtained using MRA technique without contrast. COMPARISON:  Head CT 01/30/2019 FINDINGS: MRI HEAD FINDINGS Brain: Again demonstrated is a moderate-sized acute/early subacute cortical/subcortical infarct within the posterior right MCA vascular territory involving portions of the right parietal, occipital and temporal lobes. There is a separate 13 mm acute/early subacute cortical infarct within the high posterior right parietal lobe. There are several additional scattered punctate acute/early subacute cortical infarcts within the right frontoparietal and right temporal lobes. Corresponding T2/FLAIR hyperintensity at these sites. Susceptibility weighted signal loss consistent with petechial hemorrhage in the region of the dominant infarct. There is a background of mild chronic small vessel ischemic disease. No evidence of intracranial mass. No midline shift or extra-axial fluid collection. Mild generalized parenchymal atrophy. Vascular: Reported separately Skull and upper cervical spine: No focal marrow lesion Sinuses/Orbits: Visualized orbits demonstrate no acute abnormality. Trace ethmoid sinus mucosal thickening. Small bilateral mastoid effusions. Incidentally noted Tornwaldt cyst. MRA HEAD FINDING: The intracranial internal carotid arteries are patent without significant proximal stenosis. The bilateral middle and anterior cerebral arteries are patent. Mild focal stenosis within the A1 right anterior cerebral artery. Otherwise no significant stenosis within these vessels. No intracranial aneurysm is identified. The non dominant intracranial right vertebral artery is patent and terminates as the right PICA. The left vertebral artery is patent without significant stenosis and supplies the basilar artery. The basilar artery is patent without significant stenosis. Predominantly fetal origin of the left posterior cerebral artery. There are sites of  apparent mild to moderate segmental stenosis within the bilateral P2 posterior cerebral arteries. There is a sizable right posterior communicating artery. These results were called by telephone at the time of interpretation on 01/31/2019 at 9:30 am to provider Sinda Du , who verbally acknowledged these results. IMPRESSION: MRI brain: 1. Redemonstrated moderate-sized acute/early subacute cortical/subcortical infarct within the posterior right MCA vascular territory. Additional small acute/early subacute cortical infarct within the right parietal lobe, as well as multiple punctate acute/early subacute cortical infarcts within the right frontoparietal and right temporal lobes. Petechial hemorrhage in the region of the dominant infarct. Question an embolic source within the cervical right ICA. Correlate with findings on scheduled carotid artery duplex. 2. Mild generalized parenchymal atrophy and chronic small vessel ischemic disease. 3. Small bilateral mastoid effusions. MRA head: 1. No intracranial anterior circulation large vessel occlusion or proximal high-grade  arterial stenosis. Mild focal stenosis within the A1 right anterior cerebral artery. 2. Atherosclerotic irregularity of the posterior cerebral arteries with sites of apparent mild to moderate segmental stenosis within the P2 segments bilaterally. Electronically Signed   By: Jackey Loge DO   On: 01/31/2019 09:31   US Renal  Result Date: 01/31/2019 CLINICAL DATA:  Renal insufficiency, type II diabetes mellitus, hypertension EXAM: RENAL / URINARY TRACT ULTRASOUND COMPLETE COMPARISON:  None FINDINGS: Right Kidney: Renal measurements: 11.0 x 5.7 x 6.6 cm = volume: 204 mL. Normal cortical thickness and echogenicity. No mass, hydronephrosis or shadowing calcification. Left Kidney: Renal measurements: 10.8 x 6.0 x 7.0 cm = volume: 226 mL. Normal cortical thickness and echogenicity. No mass, hydronephrosis or shadowing calcification. Bladder: Appears  normal for degree of bladder distention. BILATERAL ureteral jets visualized Other: Echogenic liver, question fatty infiltration though this can be seen with cirrhosis and certain infiltrative disorders. Hepatic margins appear smooth on obtained images. IMPRESSION: Normal renal ultrasound. Question fatty infiltration of liver as above. Electronically Signed   By: Ulyses Southward M.D.   On: 01/31/2019 11:07   US Carotid Bilateral (at [facility] Only)  Result Date: 01/31/2019 CLINICAL DATA:  68 year old male with stroke EXAM: BILATERAL CAROTID DUPLEX ULTRASOUND TECHNIQUE: Wallace Cullens scale imaging, color Doppler and duplex ultrasound were performed of bilateral carotid and vertebral arteries in the neck. COMPARISON:  None. FINDINGS: Criteria: Quantification of carotid stenosis is based on velocity parameters that correlate the residual internal carotid diameter with NASCET-based stenosis levels, using the diameter of the distal internal carotid lumen as the denominator for stenosis measurement. The following velocity measurements were obtained: RIGHT ICA:  Systolic 97 cm/sec, Diastolic 26 cm/sec CCA:  128 cm/sec SYSTOLIC ICA/CCA RATIO:  0.8 ECA:  242 cm/sec LEFT ICA:  Systolic 100 cm/sec, Diastolic 159 cm/sec CCA:  134 cm/sec SYSTOLIC ICA/CCA RATIO:  0.8 ECA:  166 cm/sec Right Brachial SBP: Not acquired Left Brachial SBP: Not acquired RIGHT CAROTID ARTERY: No significant calcifications of the right common carotid artery. Intermediate waveform maintained. Heterogeneous and partially calcified plaque at the right carotid bifurcation. No significant lumen shadowing. Low resistance waveform of the right ICA. No significant tortuosity. RIGHT VERTEBRAL ARTERY: Antegrade flow with low resistance waveform. LEFT CAROTID ARTERY: No significant calcifications of the left common carotid artery. Intermediate waveform maintained. Heterogeneous and partially calcified plaque at the left carotid bifurcation without significant lumen  shadowing. Low resistance waveform of the left ICA. No significant tortuosity. LEFT VERTEBRAL ARTERY:  Antegrade flow with low resistance waveform. IMPRESSION: Color duplex indicates minimal heterogeneous and calcified plaque, with no hemodynamically significant stenosis by duplex criteria in the extracranial cerebrovascular circulation. Signed, Yvone Neu. Reyne Dumas, RPVI Vascular and Interventional Radiology [facility] Electronically Signed   By: Gilmer Mor D.O.   On: 01/31/2019 13:08    Medications:  Prior to Admission:  Medications Prior to Admission  Medication Sig Dispense Refill Last Dose  . amitriptyline (ELAVIL) 75 MG tablet Take 75 mg by mouth at bedtime.     [name] amLODipine (NORVASC) 10 MG tablet Take 1 tablet (10 mg total) by mouth daily. Please make overdue appt with Dr. Excell Seltzer before anymore refills. 2nd attempt (Patient taking differently: Take 10 mg by mouth daily. ) 15 tablet 0 01/30/2019 at Unknown time  . aspirin 325 MG EC tablet Take 325 mg by mouth once.   01/30/2019 at Unknown time  . aspirin EC 81 MG tablet Take 1 tablet (81 mg total) by mouth daily.  Past Week at Unknown time  . benzonatate (TESSALON) 100 MG capsule Take 100 mg by mouth every 8 (eight) hours.     . clopidogrel (PLAVIX) 75 MG tablet Take 1 tablet (75 mg total) by mouth daily. Please make overdue appt with Dr. Excell Seltzer before anymore refills. 2nd attempt (Patient taking differently: Take 75 mg by mouth daily. ) 15 tablet 0 01/30/2019 at Unknown time  . ezetimibe (ZETIA) 10 MG tablet Take 1 tablet (10 mg total) by mouth daily. Please make overdue appt with Dr. Excell Seltzer before anymore refills. 1st attempt (Patient taking differently: Take 10 mg by mouth daily. ) 30 tablet 0 01/30/2019 at Unknown time  . fenofibrate 54 MG tablet Take 1 tablet (54 mg total) by mouth daily. Please call [phone] to schedule a f/u to continue refills. Thank you. 1st attmpt (Patient taking differently: Take 54 mg by  mouth daily. ) 30 tablet 0 01/30/2019 at Unknown time  . furosemide (LASIX) 40 MG tablet Take 1 tablet (40 mg total) by mouth daily. 90 tablet 3 01/30/2019 at Unknown time  . metFORMIN (GLUCOPHAGE) 1000 MG tablet Take 1,000 mg by mouth 2 (two) times daily with a meal.   01/30/2019 at Unknown time  . metoprolol tartrate (LOPRESSOR) 100 MG tablet Take 1 tablet (100 mg total) by mouth 2 (two) times daily. Please make overdue appt with Dr. Excell Seltzer before anymore refills. 1st attempt (Patient taking differently: Take 100 mg by mouth 2 (two) times daily. ) 60 tablet 0 01/30/2019 at 1630  . nitroGLYCERIN (NITROSTAT) 0.4 MG SL tablet Place 1 tablet (0.4 mg total) under the tongue every 5 (five) minutes as needed for chest pain. 25 tablet 5 unknown  . NOVOLOG MIX 70/30 FLEXPEN (70-30) 100 UNIT/ML Pen Inject 40-60 Units into the skin 2 (two) times daily. Per sliding scale   01/30/2019 at Unknown time  . rosuvastatin (CRESTOR) 5 MG tablet Take 1 tablet (5 mg total) by mouth daily. Please make overdue appt with Dr. Excell Seltzer before anymore refills. 1st attempt (Patient taking differently: Take 5 mg by mouth daily. ) 30 tablet 0 01/30/2019 at Unknown time  . VICTOZA 18 MG/3ML SOPN Inject 1.8 mg into the skin daily.   3 01/30/2019 at Unknown time   Scheduled: .  stroke: mapping our early stages of recovery book   Does not apply Once  . amitriptyline  75 mg Oral QHS  . aspirin EC  81 mg Oral Daily  . clopidogrel  75 mg Oral Daily  . heparin  5,000 Units Subcutaneous Q8H  . influenza vaccine adjuvanted  0.5 mL Intramuscular Tomorrow-1000  . insulin aspart  0-5 Units Subcutaneous QHS  . insulin aspart  0-9 Units Subcutaneous TID WC  . insulin detemir  20 Units Subcutaneous BID  . ondansetron  4 mg Intravenous Once  . pneumococcal 23 valent vaccine  0.5 mL Intramuscular Tomorrow-1000  . rosuvastatin  40 mg Oral q1800  . sodium chloride flush  3 mL Intravenous Q12H   Continuous: . sodium chloride     [id number]  chloride, acetaminophen **OR** acetaminophen (TYLENOL) oral liquid 160 mg/5 mL **OR** acetaminophen, calcium carbonate, HYDROcodone-acetaminophen, senna-docusate, sodium chloride flush  Assesment: He was admitted with an ischemic stroke.  There was concern about cardioembolic stroke.  Echocardiogram is pending he does not have any current neurological deficit but he is complaining of headache  He has hypertension which has been well controlled  He has diabetes which is doing fairly well  He has coronary disease and  had bypass grafting several years ago and he is asymptomatic  He has renal insufficiency related to his diabetes and his hypertension  He has hyperlipidemia and his Crestor has been changed to 40 mg  He may have sleep apnea Principal Problem:   Ischemic stroke (HCC) Active Problems:   Type 2 diabetes mellitus with complication, with long-term current use of insulin [name])   Essential hypertension   Coronary Artery Disease   Renal insufficiency   Stroke [facility])    Plan: Check echocardiogram.  Potential discharge with plans to have event monitor and sleep study.    LOS: 1 day   Fredirick Maudlin 02/01/2019, 8:31 AM

## 2019-02-01 NOTE — Plan of Care (Signed)
  Problem: Education: Goal: Knowledge of General Education information will improve Description: Including pain rating scale, medication(s)/side effects and non-pharmacologic comfort measures Outcome: Progressing   Problem: Health Behavior/Discharge Planning: Goal: Ability to manage health-related needs will improve Outcome: Progressing   Problem: Health Behavior/Discharge Planning: Goal: Ability to manage health-related needs will improve Outcome: Progressing   Problem: Health Behavior/Discharge Planning: Goal: Ability to manage health-related needs will improve Outcome: Progressing   Problem: Clinical Measurements: Goal: Ability to maintain clinical measurements within normal limits will improve Outcome: Progressing Goal: Will remain free from infection Outcome: Progressing   Problem: Education: Goal: Knowledge of disease or condition will improve Outcome: Progressing Goal: Knowledge of secondary prevention will improve Outcome: Progressing Goal: Knowledge of patient specific risk factors addressed and post discharge goals established will improve Outcome: Progressing   Problem: Coping: Goal: Will verbalize positive feelings about self Outcome: Progressing Goal: Will identify appropriate support needs Outcome: Progressing   Problem: Health Behavior/Discharge Planning: Goal: Ability to manage health-related needs will improve Outcome: Progressing

## 2019-02-01 NOTE — Progress Notes (Signed)
*  PRELIMINARY RESULTS* Echocardiogram 2D Echocardiogram has been performed.  [name] 02/01/2019, 10:44 AM

## 2019-02-01 NOTE — Discharge Summary (Signed)
Physician Discharge Summary  Patient ID: [name] MRN: [id number] DOB/AGE: [birth year] 66 y.o. Primary Care Physician:Tiziana Cislo, Ramon Dredge, MD Admit date: 01/30/2019 Discharge date: 02/01/2019    Discharge Diagnoses:   Principal Problem:   Ischemic stroke [facility]) Active Problems:   Type 2 diabetes mellitus with complication, with long-term current use of insulin (HCC)   Essential hypertension   Coronary Artery Disease   Renal insufficiency   Stroke [facility]) Hyperlipidemia  Allergies as of 02/01/2019      Reactions   Penicillins Other (See Comments)   Other reaction(s): Other (See Comments) Unknown-Reaction as child Unknown-Reaction as child      Medication List    TAKE these medications   amitriptyline 75 MG tablet Commonly known as: ELAVIL Take 75 mg by mouth at bedtime.   amLODipine 10 MG tablet Commonly known as: NORVASC Take 1 tablet (10 mg total) by mouth daily. Please make overdue appt with Dr. Excell Seltzer before anymore refills. 2nd attempt What changed: additional instructions   aspirin 325 MG EC tablet Take 1 tablet (325 mg total) by mouth daily. What changed:   medication strength  how much to take  Another medication with the same name was removed. Continue taking this medication, and follow the directions you see here.   benzonatate 100 MG capsule Commonly known as: TESSALON Take 100 mg by mouth every 8 (eight) hours.   clopidogrel 75 MG tablet Commonly known as: PLAVIX Take 1 tablet (75 mg total) by mouth daily. Please make overdue appt with Dr. Excell Seltzer before anymore refills. 2nd attempt What changed: additional instructions   ezetimibe 10 MG tablet Commonly known as: ZETIA Take 1 tablet (10 mg total) by mouth daily. Please make overdue appt with Dr. Excell Seltzer before anymore refills. 1st attempt What changed: additional instructions   fenofibrate 54 MG tablet Take 1 tablet (54 mg total) by mouth daily. Please call [phone] to schedule a f/u to  continue refills. Thank you. 1st attmpt What changed: additional instructions   furosemide 40 MG tablet Commonly known as: LASIX Take 1 tablet (40 mg total) by mouth daily.   metFORMIN 1000 MG tablet Commonly known as: GLUCOPHAGE Take 1,000 mg by mouth 2 (two) times daily with a meal.   metoprolol tartrate 100 MG tablet Commonly known as: LOPRESSOR Take 1 tablet (100 mg total) by mouth 2 (two) times daily. Please make overdue appt with Dr. Excell Seltzer before anymore refills. 1st attempt What changed: additional instructions   nitroGLYCERIN 0.4 MG SL tablet Commonly known as: NITROSTAT Place 1 tablet (0.4 mg total) under the tongue every 5 (five) minutes as needed for chest pain.   NovoLOG Mix 70/30 FlexPen (70-30) 100 UNIT/ML FlexPen Generic drug: insulin aspart protamine - aspart Inject 40-60 Units into the skin 2 (two) times daily. Per sliding scale   rosuvastatin 40 MG tablet Commonly known as: CRESTOR Take 1 tablet (40 mg total) by mouth daily at 6 PM. What changed:   medication strength  how much to take  when to take this  additional instructions   Victoza 18 MG/3ML Sopn Generic drug: liraglutide Inject 1.8 mg into the skin daily.       Discharged Condition: Improved    Consults: Neurology, Dr. Gerilyn Pilgrim  Significant Diagnostic Studies: Ct Head Wo Contrast  Result Date: 01/30/2019 CLINICAL DATA:  Altered level of consciousness, unexplained EXAM: CT HEAD WITHOUT CONTRAST TECHNIQUE: Contiguous axial images were obtained from the base of the skull through the vertex without intravenous contrast. COMPARISON:  None. FINDINGS: Brain:  Wedge-shaped region of hypoattenuation involving the cortex and white matter of the right parieto-occipital region extending from the posterior horn lateral ventricle to the cortex, involving the MCA/PCA watershed distribution. No evidence of acute intracranial hemorrhage. No mass effect or midline shift. No extra-axial collection.  Background of mild parenchymal volume loss and chronic microvascular ischemic angiopathy. Vascular: Atherosclerotic calcification of the carotid siphons and intradural vertebral arteries. No hyperdense vessel. Skull: No calvarial fracture or suspicious osseous lesion. No scalp swelling or hematoma. Sinuses/Orbits: Paranasal sinuses and mastoid air cells are predominantly clear. Included orbital structures are unremarkable. Other: None IMPRESSION: 1. Wedge-shaped region of hypoattenuation involving the cortex and white matter of the right parieto-occipital region extending from the posterior horn lateral ventricle to the cortex, involving the MCA/PCA watershed distribution, consistent with acute/subacute infarct. No evidence of acute intracranial hemorrhage. 2. Mild parenchymal volume loss and chronic microvascular ischemic angiopathy. These results were called by telephone at the time of interpretation on 01/30/2019 at 10:43 pm to provider JOSHUA LONG , who verbally acknowledged these results. Electronically Signed   By: Lovena Le M.D.   On: 01/30/2019 22:46   Mr Angio Head Wo Contrast  Result Date: 01/31/2019 CLINICAL DATA:  Headache, acute, severe, worse headache of life. Stroke, follow-up. Additional history provided: Headache, acute, severe, worst headache of life. Patient awoke Sunday morning with headache and confusion, states confusion is better but not resolved. EXAM: MRI HEAD WITHOUT CONTRAST MRA HEAD WITHOUT CONTRAST TECHNIQUE: Multiplanar, multiecho pulse sequences of the brain and surrounding structures were obtained without intravenous contrast. Angiographic images of the head were obtained using MRA technique without contrast. COMPARISON:  Head CT 01/30/2019 FINDINGS: MRI HEAD FINDINGS Brain: Again demonstrated is a moderate-sized acute/early subacute cortical/subcortical infarct within the posterior right MCA vascular territory involving portions of the right parietal, occipital and temporal  lobes. There is a separate 13 mm acute/early subacute cortical infarct within the high posterior right parietal lobe. There are several additional scattered punctate acute/early subacute cortical infarcts within the right frontoparietal and right temporal lobes. Corresponding T2/FLAIR hyperintensity at these sites. Susceptibility weighted signal loss consistent with petechial hemorrhage in the region of the dominant infarct. There is a background of mild chronic small vessel ischemic disease. No evidence of intracranial mass. No midline shift or extra-axial fluid collection. Mild generalized parenchymal atrophy. Vascular: Reported separately Skull and upper cervical spine: No focal marrow lesion Sinuses/Orbits: Visualized orbits demonstrate no acute abnormality. Trace ethmoid sinus mucosal thickening. Small bilateral mastoid effusions. Incidentally noted Tornwaldt cyst. MRA HEAD FINDING: The intracranial internal carotid arteries are patent without significant proximal stenosis. The bilateral middle and anterior cerebral arteries are patent. Mild focal stenosis within the A1 right anterior cerebral artery. Otherwise no significant stenosis within these vessels. No intracranial aneurysm is identified. The non dominant intracranial right vertebral artery is patent and terminates as the right PICA. The left vertebral artery is patent without significant stenosis and supplies the basilar artery. The basilar artery is patent without significant stenosis. Predominantly fetal origin of the left posterior cerebral artery. There are sites of apparent mild to moderate segmental stenosis within the bilateral P2 posterior cerebral arteries. There is a sizable right posterior communicating artery. These results were called by telephone at the time of interpretation on 01/31/2019 at 9:30 am to provider Sinda Du , who verbally acknowledged these results. IMPRESSION: MRI brain: 1. Redemonstrated moderate-sized acute/early  subacute cortical/subcortical infarct within the posterior right MCA vascular territory. Additional small acute/early subacute cortical infarct within the right parietal  lobe, as well as multiple punctate acute/early subacute cortical infarcts within the right frontoparietal and right temporal lobes. Petechial hemorrhage in the region of the dominant infarct. Question an embolic source within the cervical right ICA. Correlate with findings on scheduled carotid artery duplex. 2. Mild generalized parenchymal atrophy and chronic small vessel ischemic disease. 3. Small bilateral mastoid effusions. MRA head: 1. No intracranial anterior circulation large vessel occlusion or proximal high-grade arterial stenosis. Mild focal stenosis within the A1 right anterior cerebral artery. 2. Atherosclerotic irregularity of the posterior cerebral arteries with sites of apparent mild to moderate segmental stenosis within the P2 segments bilaterally. Electronically Signed   By: Jackey Loge DO   On: 01/31/2019 09:31   Mr Brain Wo Contrast  Result Date: 01/31/2019 CLINICAL DATA:  Headache, acute, severe, worse headache of life. Stroke, follow-up. Additional history provided: Headache, acute, severe, worst headache of life. Patient awoke Sunday morning with headache and confusion, states confusion is better but not resolved. EXAM: MRI HEAD WITHOUT CONTRAST MRA HEAD WITHOUT CONTRAST TECHNIQUE: Multiplanar, multiecho pulse sequences of the brain and surrounding structures were obtained without intravenous contrast. Angiographic images of the head were obtained using MRA technique without contrast. COMPARISON:  Head CT 01/30/2019 FINDINGS: MRI HEAD FINDINGS Brain: Again demonstrated is a moderate-sized acute/early subacute cortical/subcortical infarct within the posterior right MCA vascular territory involving portions of the right parietal, occipital and temporal lobes. There is a separate 13 mm acute/early subacute cortical infarct  within the high posterior right parietal lobe. There are several additional scattered punctate acute/early subacute cortical infarcts within the right frontoparietal and right temporal lobes. Corresponding T2/FLAIR hyperintensity at these sites. Susceptibility weighted signal loss consistent with petechial hemorrhage in the region of the dominant infarct. There is a background of mild chronic small vessel ischemic disease. No evidence of intracranial mass. No midline shift or extra-axial fluid collection. Mild generalized parenchymal atrophy. Vascular: Reported separately Skull and upper cervical spine: No focal marrow lesion Sinuses/Orbits: Visualized orbits demonstrate no acute abnormality. Trace ethmoid sinus mucosal thickening. Small bilateral mastoid effusions. Incidentally noted Tornwaldt cyst. MRA HEAD FINDING: The intracranial internal carotid arteries are patent without significant proximal stenosis. The bilateral middle and anterior cerebral arteries are patent. Mild focal stenosis within the A1 right anterior cerebral artery. Otherwise no significant stenosis within these vessels. No intracranial aneurysm is identified. The non dominant intracranial right vertebral artery is patent and terminates as the right PICA. The left vertebral artery is patent without significant stenosis and supplies the basilar artery. The basilar artery is patent without significant stenosis. Predominantly fetal origin of the left posterior cerebral artery. There are sites of apparent mild to moderate segmental stenosis within the bilateral P2 posterior cerebral arteries. There is a sizable right posterior communicating artery. These results were called by telephone at the time of interpretation on 01/31/2019 at 9:30 am to provider Kari Baars , who verbally acknowledged these results. IMPRESSION: MRI brain: 1. Redemonstrated moderate-sized acute/early subacute cortical/subcortical infarct within the posterior right MCA  vascular territory. Additional small acute/early subacute cortical infarct within the right parietal lobe, as well as multiple punctate acute/early subacute cortical infarcts within the right frontoparietal and right temporal lobes. Petechial hemorrhage in the region of the dominant infarct. Question an embolic source within the cervical right ICA. Correlate with findings on scheduled carotid artery duplex. 2. Mild generalized parenchymal atrophy and chronic small vessel ischemic disease. 3. Small bilateral mastoid effusions. MRA head: 1. No intracranial anterior circulation large vessel occlusion or proximal high-grade  arterial stenosis. Mild focal stenosis within the A1 right anterior cerebral artery. 2. Atherosclerotic irregularity of the posterior cerebral arteries with sites of apparent mild to moderate segmental stenosis within the P2 segments bilaterally. Electronically Signed   By: Jackey LogeKyle  Golden DO   On: 01/31/2019 09:31   [place] Renal  Result Date: 01/31/2019 CLINICAL DATA:  Renal insufficiency, type II diabetes mellitus, hypertension EXAM: RENAL / URINARY TRACT ULTRASOUND COMPLETE COMPARISON:  None FINDINGS: Right Kidney: Renal measurements: 11.0 x 5.7 x 6.6 cm = volume: 204 mL. Normal cortical thickness and echogenicity. No mass, hydronephrosis or shadowing calcification. Left Kidney: Renal measurements: 10.8 x 6.0 x 7.0 cm = volume: 226 mL. Normal cortical thickness and echogenicity. No mass, hydronephrosis or shadowing calcification. Bladder: Appears normal for degree of bladder distention. BILATERAL ureteral jets visualized Other: Echogenic liver, question fatty infiltration though this can be seen with cirrhosis and certain infiltrative disorders. Hepatic margins appear smooth on obtained images. IMPRESSION: Normal renal ultrasound. Question fatty infiltration of liver as above. Electronically Signed   By: Ulyses SouthwardMark  Boles M.D.   On: 01/31/2019 11:07   [place] Carotid Bilateral (at [facility] Only)  Result  Date: 01/31/2019 CLINICAL DATA:  68 year old male with stroke EXAM: BILATERAL CAROTID DUPLEX ULTRASOUND TECHNIQUE: Wallace CullensGray scale imaging, color Doppler and duplex ultrasound were performed of bilateral carotid and vertebral arteries in the neck. COMPARISON:  None. FINDINGS: Criteria: Quantification of carotid stenosis is based on velocity parameters that correlate the residual internal carotid diameter with NASCET-based stenosis levels, using the diameter of the distal internal carotid lumen as the denominator for stenosis measurement. The following velocity measurements were obtained: RIGHT ICA:  Systolic 97 cm/sec, Diastolic 26 cm/sec CCA:  128 cm/sec SYSTOLIC ICA/CCA RATIO:  0.8 ECA:  242 cm/sec LEFT ICA:  Systolic 100 cm/sec, Diastolic 159 cm/sec CCA:  134 cm/sec SYSTOLIC ICA/CCA RATIO:  0.8 ECA:  166 cm/sec Right Brachial SBP: Not acquired Left Brachial SBP: Not acquired RIGHT CAROTID ARTERY: No significant calcifications of the right common carotid artery. Intermediate waveform maintained. Heterogeneous and partially calcified plaque at the right carotid bifurcation. No significant lumen shadowing. Low resistance waveform of the right ICA. No significant tortuosity. RIGHT VERTEBRAL ARTERY: Antegrade flow with low resistance waveform. LEFT CAROTID ARTERY: No significant calcifications of the left common carotid artery. Intermediate waveform maintained. Heterogeneous and partially calcified plaque at the left carotid bifurcation without significant lumen shadowing. Low resistance waveform of the left ICA. No significant tortuosity. LEFT VERTEBRAL ARTERY:  Antegrade flow with low resistance waveform. IMPRESSION: Color duplex indicates minimal heterogeneous and calcified plaque, with no hemodynamically significant stenosis by duplex criteria in the extracranial cerebrovascular circulation. Signed, Yvone NeuJaime S. Reyne DumasWagner, DO, RPVI [facility] Electronically Signed    By: Gilmer MorJaime  Wagner D.O.   On: 01/31/2019 13:08    Lab Results: Basic Metabolic Panel: Recent Labs    01/30/19 2148  NA 133*  K 3.7  CL 96*  CO2 24  GLUCOSE 389*  BUN 31*  CREATININE 1.85*  CALCIUM 9.5   Liver Function Tests: Recent Labs    01/30/19 2148  AST 29  ALT 29  ALKPHOS 75  BILITOT 0.7  PROT 7.3  ALBUMIN 4.3     CBC: Recent Labs    01/30/19 2148  WBC 8.1  NEUTROABS 4.6  HGB 16.4  HCT 49.3  MCV 87.4  PLT 283    Recent Results (from the past 240 hour(s))  SARS CORONAVIRUS 2 (TAT 6-24 HRS) Nasopharyngeal Nasopharyngeal  Swab     Status: None   Collection Time: 01/30/19 11:02 PM   Specimen: Nasopharyngeal Swab  Result Value Ref Range Status   SARS Coronavirus 2 NEGATIVE NEGATIVE Final    Comment: (NOTE) SARS-CoV-2 target nucleic acids are NOT DETECTED. The SARS-CoV-2 RNA is generally detectable in upper and lower respiratory specimens during the acute phase of infection. Negative results do not preclude SARS-CoV-2 infection, do not rule out co-infections with other pathogens, and should not be used as the sole basis for treatment or other patient management decisions. Negative results must be combined with clinical observations, patient history, and epidemiological information. The expected result is Negative. Fact Sheet for Patients: HairSlick.no Fact Sheet for Healthcare Providers: quierodirigir.com This test is not yet approved or cleared by the [place] FDA and  has been authorized for detection and/or diagnosis of SARS-CoV-2 by FDA under an Emergency Use Authorization (EUA). This EUA will remain  in effect (meaning this test can be used) for the duration of the COVID-19 declaration under Section 56 4(b)(1) of the Act, 21 U.S.C. section 360bbb-3(b)(1), unless the authorization is terminated or revoked sooner. Performed at [facility], [address], [place],  [place] [address]      Hospital Course: This is a 68 year old who came to the emergency department because of a headache.  He also had some confusion.  He had CT done and was found to have what appeared to be a stroke.  This has been going on for about 48 hours so he was outside the window of immediate treatment.  He had MRI/MRA and did not have any blockages of the arteries that were amenable to any sort of acute treatment.  There was concern that he might have cardioembolic phenomenon.  He had consultation with Dr. Gerilyn Pilgrim from neurology and is going to stay on dual antiplatelet treatment and he is going to have a sleep study and an event monitor.  He is pretty much asymptomatic now except for a mild headache and so should be able to be discharged after he has his echocardiogram which is still pending  Discharge Exam: Blood pressure 140/71, pulse 75, temperature 98.1 F (36.7 C), resp. rate 17, height  (1.778 m), weight 106.6 kg, SpO2 95 %. He is awake and alert.  Chest is clear.  Heart is regular.  No neurological findings now  Disposition: Home he will need an event monitor and a sleep study      Signed: Fredirick Maudlin   02/01/2019, 8:36 AM

## 2019-02-01 NOTE — Progress Notes (Signed)
Nsg Discharge Note  Admit Date:  01/30/2019 Discharge date: 02/01/2019   [name] to be D/C'd Home per MD order.  AVS completed.  Copy for chart, and copy for patient signed, and dated. Patient/caregiver able to verbalize understanding. Removed IV-clean, dry, intact. Reviewed d/c paperwork with patient and wife. Reviewed stroke information and what to do if you think you are having a stroke. Wheeled stable patient and belongings to main entrance where he was picked up by his wife.  Discharge Medication: Allergies as of 02/01/2019      Reactions   Penicillins Other (See Comments)   Other reaction(s): Other (See Comments) Unknown-Reaction as child Unknown-Reaction as child      Medication List    TAKE these medications   amitriptyline 75 MG tablet Commonly known as: ELAVIL Take 75 mg by mouth at bedtime.   amLODipine 10 MG tablet Commonly known as: NORVASC Take 1 tablet (10 mg total) by mouth daily. Please make overdue appt with Dr. Excell Seltzer before anymore refills. 2nd attempt What changed: additional instructions   aspirin 325 MG EC tablet Take 1 tablet (325 mg total) by mouth daily. What changed:   medication strength  how much to take  Another medication with the same name was removed. Continue taking this medication, and follow the directions you see here.   benzonatate 100 MG capsule Commonly known as: TESSALON Take 100 mg by mouth every 8 (eight) hours.   clopidogrel 75 MG tablet Commonly known as: PLAVIX Take 1 tablet (75 mg total) by mouth daily. Please make overdue appt with Dr. Excell Seltzer before anymore refills. 2nd attempt What changed: additional instructions   ezetimibe 10 MG tablet Commonly known as: ZETIA Take 1 tablet (10 mg total) by mouth daily. Please make overdue appt with Dr. Excell Seltzer before anymore refills. 1st attempt What changed: additional instructions   fenofibrate 54 MG tablet Take 1 tablet (54 mg total) by mouth daily. Please call  [phone] to schedule a f/u to continue refills. Thank you. 1st attmpt What changed: additional instructions   furosemide 40 MG tablet Commonly known as: LASIX Take 1 tablet (40 mg total) by mouth daily.   metFORMIN 1000 MG tablet Commonly known as: GLUCOPHAGE Take 1,000 mg by mouth 2 (two) times daily with a meal.   metoprolol tartrate 100 MG tablet Commonly known as: LOPRESSOR Take 1 tablet (100 mg total) by mouth 2 (two) times daily. Please make overdue appt with Dr. Excell Seltzer before anymore refills. 1st attempt What changed: additional instructions   nitroGLYCERIN 0.4 MG SL tablet Commonly known as: NITROSTAT Place 1 tablet (0.4 mg total) under the tongue every 5 (five) minutes as needed for chest pain.   NovoLOG Mix 70/30 FlexPen (70-30) 100 UNIT/ML FlexPen Generic drug: insulin aspart protamine - aspart Inject 40-60 Units into the skin 2 (two) times daily. Per sliding scale   rosuvastatin 40 MG tablet Commonly known as: CRESTOR Take 1 tablet (40 mg total) by mouth daily at 6 PM. What changed:   medication strength  how much to take  when to take this  additional instructions   Victoza 18 MG/3ML Sopn Generic drug: liraglutide Inject 1.8 mg into the skin daily.       Discharge Assessment: Vitals:   02/01/19 1200 02/01/19 1259  BP: (!) 144/83 (!) 144/86  Pulse: 71 70  Resp: 18 17  Temp:    SpO2: 94% 93%   Skin clean, dry and intact without evidence of skin break down, no evidence of  skin tears noted. IV catheter discontinued intact. Site without signs and symptoms of complications - no redness or edema noted at insertion site, patient denies c/o pain - only slight tenderness at site.  Dressing with slight pressure applied.  D/c Instructions-Education: Discharge instructions given to patient/family with verbalized understanding. D/c education completed with patient/family including follow up instructions, medication list, d/c activities limitations if  indicated, with other d/c instructions as indicated by MD - patient able to verbalize understanding, all questions fully answered. Patient instructed to return to ED, call 911, or call MD for any changes in condition.  Patient escorted via [place], and D/C home via private auto.  Santa Lighter, RN 02/01/2019 3:37 PM

## 2019-02-01 NOTE — Progress Notes (Signed)
Inpatient Diabetes Program Recommendations  AACE/ADA: New Consensus Statement on Inpatient Glycemic Control (2015)  Target Ranges:  Prepandial:   less than 140 mg/dL      Peak postprandial:   less than 180 mg/dL (1-2 hours)      Critically ill patients:  140 - 180 mg/dL   Lab Results  Component Value Date   GLUCAP 277 (H) 02/01/2019   HGBA1C 9.2 (H) 01/30/2019    Review of Glycemic Control Results for KINCAID, TIGER (MRN [id number]) as of 02/01/2019 12:32  Ref. Range 01/31/2019 17:02 01/31/2019 20:32 02/01/2019 07:17 02/01/2019 10:55  Glucose-Capillary Latest Ref Range: 70 - 99 mg/dL 238 (H) 312 (H) 255 (H) 277 (H)   Diabetes history: Type 2 DM Outpatient Diabetes medications: Metformin 1000 mg BID, Novolog 70/30 40-60 units BID, Victoza 1.8 mg QD Current orders for Inpatient glycemic control: Novolog 0-9 units TID, Novolog 0-5 units QHS, Levemir 20 units BID  Inpatient Diabetes Program Recommendations:    Glucose trends exceeding inpatient goals.  Consider increasing Levemir to 30 units BID and increasing Novolog correction to Novolog 0-15 units TID.  Secure chat sent to MD.  Thanks, Bronson Curb, MSN, RNC-OB Diabetes Coordinator [phone] (8a-5p)

## 2019-02-01 NOTE — Progress Notes (Signed)
OT Cancellation Note  Patient Details Name: [name] MRN: [id number] DOB: Jun 03, 1950   Cancelled Treatment:    Reason Eval/Treat Not Completed: OT screened, no needs identified, will sign off. Pt screened in room this am. Pt is completing ADLs independently, reports sensitivity to light, otherwise no visual deficits. Pt with BUE strength WNL, coordination and sensation intact. Pt is at baseline with functional task completion. No further OT services required at this time.    Guadelupe Sabin, OTR/L  [phone] 02/01/2019, 7:43 AM

## 2019-02-07 ENCOUNTER — Telehealth: Payer: Self-pay

## 2019-02-07 DIAGNOSIS — I639 Cerebral infarction, unspecified: Secondary | ICD-10-CM

## 2019-02-07 NOTE — Telephone Encounter (Addendum)
Received fax form Dr.Hawkins office for patient to wear 30 day event monitor, as patient had a stroke   Order placed through [employer]   LM on pt's home number that [employer] will get in touch with him and monitor will be delivered UPS to his home.

## 2019-02-09 DIAGNOSIS — E1165 Type 2 diabetes mellitus with hyperglycemia: Secondary | ICD-10-CM | POA: Diagnosis not present

## 2019-02-09 DIAGNOSIS — I10 Essential (primary) hypertension: Secondary | ICD-10-CM | POA: Diagnosis not present

## 2019-02-09 DIAGNOSIS — I639 Cerebral infarction, unspecified: Secondary | ICD-10-CM | POA: Diagnosis not present

## 2019-02-09 DIAGNOSIS — I2510 Atherosclerotic heart disease of native coronary artery without angina pectoris: Secondary | ICD-10-CM | POA: Diagnosis not present

## 2019-02-10 ENCOUNTER — Other Ambulatory Visit (HOSPITAL_BASED_OUTPATIENT_CLINIC_OR_DEPARTMENT_OTHER): Payer: Self-pay

## 2019-02-10 DIAGNOSIS — R454 Irritability and anger: Secondary | ICD-10-CM

## 2019-02-10 DIAGNOSIS — R0683 Snoring: Secondary | ICD-10-CM

## 2019-02-16 ENCOUNTER — Other Ambulatory Visit: Payer: Self-pay | Admitting: Physician Assistant

## 2019-02-16 ENCOUNTER — Other Ambulatory Visit: Payer: Self-pay

## 2019-02-16 ENCOUNTER — Ambulatory Visit (INDEPENDENT_AMBULATORY_CARE_PROVIDER_SITE_OTHER): Payer: Medicare Other

## 2019-02-16 ENCOUNTER — Other Ambulatory Visit (HOSPITAL_COMMUNITY)
Admission: RE | Admit: 2019-02-16 | Discharge: 2019-02-16 | Disposition: A | Discharge status: Home / Self Care | Payer: Medicare Other | Source: Ambulatory Visit | Attending: Neurology | Admitting: Neurology

## 2019-02-16 DIAGNOSIS — I499 Cardiac arrhythmia, unspecified: Secondary | ICD-10-CM | POA: Diagnosis not present

## 2019-02-16 DIAGNOSIS — I639 Cerebral infarction, unspecified: Secondary | ICD-10-CM | POA: Diagnosis not present

## 2019-02-16 DIAGNOSIS — I483 Typical atrial flutter: Secondary | ICD-10-CM | POA: Diagnosis not present

## 2019-02-17 ENCOUNTER — Other Ambulatory Visit (HOSPITAL_COMMUNITY)
Admission: RE | Admit: 2019-02-17 | Discharge: 2019-02-17 | Disposition: A | Discharge status: Home / Self Care | Payer: Medicare Other | Source: Ambulatory Visit | Attending: Neurology | Admitting: Neurology

## 2019-02-17 ENCOUNTER — Other Ambulatory Visit: Payer: Self-pay

## 2019-02-17 DIAGNOSIS — Z20828 Contact with and (suspected) exposure to other viral communicable diseases: Secondary | ICD-10-CM | POA: Diagnosis not present

## 2019-02-17 DIAGNOSIS — Z01812 Encounter for preprocedural laboratory examination: Secondary | ICD-10-CM | POA: Insufficient documentation

## 2019-02-17 LAB — SARS CORONAVIRUS 2 (TAT 6-24 HRS): SARS Coronavirus 2: NEGATIVE

## 2019-02-19 ENCOUNTER — Other Ambulatory Visit: Payer: Self-pay

## 2019-02-19 ENCOUNTER — Ambulatory Visit: Payer: Medicare Other | Attending: Pulmonary Disease | Admitting: Neurology

## 2019-02-19 DIAGNOSIS — Z7902 Long term (current) use of antithrombotics/antiplatelets: Secondary | ICD-10-CM | POA: Diagnosis not present

## 2019-02-19 DIAGNOSIS — G4733 Obstructive sleep apnea (adult) (pediatric): Secondary | ICD-10-CM | POA: Diagnosis not present

## 2019-02-19 DIAGNOSIS — Z7982 Long term (current) use of aspirin: Secondary | ICD-10-CM | POA: Diagnosis not present

## 2019-02-19 DIAGNOSIS — R0683 Snoring: Secondary | ICD-10-CM

## 2019-02-19 DIAGNOSIS — Z79899 Other long term (current) drug therapy: Secondary | ICD-10-CM | POA: Insufficient documentation

## 2019-02-19 DIAGNOSIS — Z794 Long term (current) use of insulin: Secondary | ICD-10-CM | POA: Insufficient documentation

## 2019-02-19 DIAGNOSIS — G4761 Periodic limb movement disorder: Secondary | ICD-10-CM | POA: Insufficient documentation

## 2019-02-19 DIAGNOSIS — R454 Irritability and anger: Secondary | ICD-10-CM

## 2019-02-20 ENCOUNTER — Other Ambulatory Visit: Payer: Self-pay | Admitting: Physician Assistant

## 2019-02-26 NOTE — Procedures (Signed)
Craven A. Merlene Laughter, MD     [email or web address]             NOCTURNAL POLYSOMNOGRAPHY   LOCATION: [facility]   Patient Name: [name], [name] Date: 02/19/2019 Gender: Male D.O.B: [birth year] Age (years): 68 Referring Provider: Sinda Du Height (inches): 66 Interpreting Physician: Phillips Odor MD, ABSM Weight (lbs): 235 RPSGT: Rosebud Poles BMI: 38 MRN: [id number] Neck Size: 19.00 CLINICAL INFORMATION  Sleep Study Type: Split Night CPAP     Indication for sleep study: Snoring     Epworth Sleepiness Score: 15 SLEEP STUDY TECHNIQUE  As per the AASM Manual for the Scoring of Sleep and Associated Events v2.3 (April 2016) with a hypopnea requiring 4% desaturations.  The channels recorded and monitored were frontal, central and occipital EEG, electrooculogram (EOG), submentalis EMG (chin), nasal and oral airflow, thoracic and abdominal wall motion, anterior tibialis EMG, snore microphone, electrocardiogram, and pulse oximetry. Continuous positive airway pressure (CPAP) was initiated when the patient met split night criteria and was titrated according to treat sleep-disordered breathing.  MEDICATIONS  Current Outpatient Medications:  .  amitriptyline (ELAVIL) 75 MG tablet, Take 75 mg by mouth at bedtime., Disp: , Rfl:  .  amLODipine (NORVASC) 10 MG tablet, Take 1 tablet (10 mg total) by mouth daily. Please make overdue appt with Dr. Burt Knack before anymore refills. 3rd and Final Attempt, Disp: 15 tablet, Rfl: 0 .  aspirin EC 325 MG EC tablet, Take 1 tablet (325 mg total) by mouth daily., Disp: 30 tablet, Rfl: 0 .  benzonatate (TESSALON) 100 MG capsule, Take 100 mg by mouth every 8 (eight) hours., Disp: , Rfl:  .  clopidogrel (PLAVIX) 75 MG tablet, Take 1 tablet (75 mg total) by mouth daily. Please make overdue appt with Dr. Burt Knack before anymore refills. 2nd attempt (Patient taking differently: Take 75 mg by mouth daily. ), Disp: 15 tablet, Rfl: 0  .  ezetimibe (ZETIA) 10 MG tablet, Take 1 tablet (10 mg total) by mouth daily. Please make overdue appt with Dr. Burt Knack before anymore refills. 2nd attempt, Disp: 15 tablet, Rfl: 0 .  fenofibrate 54 MG tablet, Take 1 tablet (54 mg total) by mouth daily. Please call [phone] to schedule a f/u to continue refills. Thank you. 1st attmpt (Patient taking differently: Take 54 mg by mouth daily. ), Disp: 30 tablet, Rfl: 0 .  furosemide (LASIX) 40 MG tablet, Take 1 tablet (40 mg total) by mouth daily., Disp: 90 tablet, Rfl: 3 .  metFORMIN (GLUCOPHAGE) 1000 MG tablet, Take 1,000 mg by mouth 2 (two) times daily with a meal., Disp: , Rfl:  .  metoprolol tartrate (LOPRESSOR) 100 MG tablet, Take 1 tablet (100 mg total) by mouth 2 (two) times daily. Please make overdue appt with Dr. Burt Knack before anymore refills. 2nd attempt, Disp: 30 tablet, Rfl: 0 .  nitroGLYCERIN (NITROSTAT) 0.4 MG SL tablet, Place 1 tablet (0.4 mg total) under the tongue every 5 (five) minutes as needed for chest pain., Disp: 25 tablet, Rfl: 5 .  NOVOLOG MIX 70/30 FLEXPEN (70-30) 100 UNIT/ML Pen, Inject 40-60 Units into the skin 2 (two) times daily. Per sliding scale, Disp: , Rfl:  .  rosuvastatin (CRESTOR) 40 MG tablet, Take 1 tablet (40 mg total) by mouth daily at 6 PM., Disp: 30 tablet, Rfl: 5 .  VICTOZA 18 MG/3ML SOPN, Inject 1.8 mg into the skin daily. , Disp: , Rfl: 3  Medications self-administered by patient taken the night of the study : N/A  RESPIRATORY PARAMETERS  Diagnostic Total AHI (/hr):  40.2 RDI (/hr): 40.2 OA Index (/hr):  1.2 CA Index (/hr):  0.0 REM AHI (/hr):  N/A NREM AHI (/hr):  40.2 Supine AHI (/hr):  57.5 Non-supine AHI (/hr):  37.1 Min O2 Sat (%): 83.0 Mean O2 (%): 89.1 Time below 88% (min): 40.6    Titration Optimal Pressure (cm):  AHI at Optimal Pressure (/hr): N/A Min O2 at Optimal Pressure (%): 82.0 Supine % at Optimal (%): N/A Sleep % at Optimal (%): N/A   SLEEP ARCHITECTURE  The recording time for  the entire night was 355.1 minutes.  During a baseline period of 159.3 minutes, the patient slept for 97.0 minutes in REM and nonREM, yielding a sleep efficiency of 60.9%%. Sleep onset after lights out was 10.7 minutes with a REM latency of N/A minutes. The patient spent 19.6%% of the night in stage N1 sleep, 80.4%% in stage N2 sleep, 0.0%% in stage N3 and 0% in REM.     During the titration period of 190.6 minutes, the patient slept for 169.5 minutes in REM and nonREM, yielding a sleep efficiency of 88.9%%. Sleep onset after CPAP initiation was 10.8 minutes with a REM latency of 162.0 minutes. The patient spent 3.8%% of the night in stage N1 sleep, 70.5%% in stage N2 sleep, 24.2%% in stage N3 and 1.5% in REM.  CARDIAC DATA The 2 lead EKG demonstrated sinus rhythm. The mean heart rate was 100.0 beats per minute. Other EKG findings include: None.  LEG MOVEMENT DATA Moderate to severe periodic limb movement disorder is noted.  IMPRESSIONS 1.  Severe obstructive sleep apnea syndrome is noted with this study.  The optimal with this study is 9 but significant residual events were noted.  Therefore, the recommended pressure is 10. 2.  Moderate to severe periodic limb movements are noted.   Delano Metz, MD Diplomate, American Board of Sleep Medicine. ELECTRONICALLY SIGNED ON:  02/26/2019, 1:47 PM [place] PH: [phone]   FX: [phone] [place]

## 2019-02-28 ENCOUNTER — Other Ambulatory Visit: Payer: Self-pay | Admitting: Physician Assistant

## 2019-03-05 ENCOUNTER — Other Ambulatory Visit: Payer: Self-pay | Admitting: Physician Assistant

## 2019-03-06 ENCOUNTER — Telehealth: Payer: Self-pay | Admitting: Cardiovascular Disease

## 2019-03-06 NOTE — Telephone Encounter (Signed)
°*  STAT* If patient is at the pharmacy, call can be transferred to refill team.   1. Which medications need to be refilled? (please list name of each medication and dose if known)  clopidogrel (PLAVIX) 75 MG tablet rosuvastatin (CRESTOR) 40 MG tablet (patient states he is taking 5mg , please check for correct dosage)  2. Which pharmacy/location (including street and city if local pharmacy) is medication to be sent to? [id number] - [place], [place] - [address]  3. Do they need a 30 day or 90 day supply? 30 day

## 2019-03-07 MED ORDER — ROSUVASTATIN CALCIUM 40 MG PO TABS: 40.0000 mg | ORAL_TABLET | Freq: Every day | ORAL | 0 refills | Status: DC

## 2019-03-07 MED ORDER — CLOPIDOGREL BISULFATE 75 MG PO TABS: 75.0000 mg | ORAL_TABLET | Freq: Every day | ORAL | 0 refills | Status: DC

## 2019-03-07 NOTE — Telephone Encounter (Signed)
Pt's medications was sent to pt's pharmacy as requested. Confirmation received.  

## 2019-03-08 DIAGNOSIS — I639 Cerebral infarction, unspecified: Secondary | ICD-10-CM | POA: Diagnosis not present

## 2019-03-08 DIAGNOSIS — E1165 Type 2 diabetes mellitus with hyperglycemia: Secondary | ICD-10-CM | POA: Diagnosis not present

## 2019-03-08 DIAGNOSIS — I2510 Atherosclerotic heart disease of native coronary artery without angina pectoris: Secondary | ICD-10-CM | POA: Diagnosis not present

## 2019-03-08 DIAGNOSIS — G4733 Obstructive sleep apnea (adult) (pediatric): Secondary | ICD-10-CM | POA: Diagnosis not present

## 2019-03-12 ENCOUNTER — Other Ambulatory Visit: Payer: Self-pay | Admitting: Physician Assistant

## 2019-03-16 DIAGNOSIS — E7800 Pure hypercholesterolemia, unspecified: Secondary | ICD-10-CM | POA: Diagnosis not present

## 2019-03-16 DIAGNOSIS — E1165 Type 2 diabetes mellitus with hyperglycemia: Secondary | ICD-10-CM | POA: Diagnosis not present

## 2019-03-16 DIAGNOSIS — E1140 Type 2 diabetes mellitus with diabetic neuropathy, unspecified: Secondary | ICD-10-CM | POA: Diagnosis not present

## 2019-03-16 DIAGNOSIS — I2510 Atherosclerotic heart disease of native coronary artery without angina pectoris: Secondary | ICD-10-CM | POA: Diagnosis not present

## 2019-03-16 DIAGNOSIS — N189 Chronic kidney disease, unspecified: Secondary | ICD-10-CM | POA: Diagnosis not present

## 2019-03-16 DIAGNOSIS — I10 Essential (primary) hypertension: Secondary | ICD-10-CM | POA: Diagnosis not present

## 2019-03-21 ENCOUNTER — Other Ambulatory Visit: Payer: Self-pay | Admitting: Physician Assistant

## 2019-03-28 NOTE — Progress Notes (Signed)
Cardiology Office Note:    Date:  03/29/2019   ID:  [name], DOB 1950-08-23, MRN [id number]  PCP:  [name] Du, MD  Cardiologist:  Sherren Mocha, MD / [name], PA-C  Electrophysiologist:  None   Referring MD: [name] Du, MD   Chief Complaint  Patient presents with  . Follow-up    CAD     History of Present Illness:    [name] is a 68 y.o. male with:   Coronary artery disease   S/p CABG in 2009  Hypertension   Hyperlipidemia   Diabetes mellitus   Hx of diabetic foot ulcer  PAD   eval by Dr. Fletcher Anon in 2019 >> small vessel dz >> med Rx   S/p cryptogenic CVA 01/2019  2 vascular territories (R temporoparietal and occipital) - ?cardioembolic   Event monitor done by PCP  [name] was last seen in 12/2017.  Since then, he was admitted to [facility] in 01/2019 with a cryptogenic stroke.  There was concern for cardioembolic source.  EF was normal by echocardiogram.  There was no significant disease on carotid US.  His PCP had him wear a 30 day monitor.  He returns for follow-up.  He is here alone.  He has not had any residual weakness but has had some issues with interpreting numbers.  He is followed by neurology in [place].  He has not had syncope, orthopnea.  He has dependent edema from venous insufficiency.  He has occasional atypical chest pain.  He has not had exertional chest discomfort reminiscent of his previous angina.  He has shortness of breath with more moderate to extreme activities.  This is unchanged.  Sleep test was positive and CPAP is pending.     Prior CV studies:   The following studies were reviewed today:   Echocardiogram 02/01/2019 EF 60-65, mod LVH, mildly reduced RVSF, trace MR, mild TR  Carotid US 01/31/2019 IMPRESSION: Color duplex indicates minimal heterogeneous and calcified plaque, with no hemodynamically significant stenosis by duplex criteria in the extracranial cerebrovascular  circulation.  Echocardiogram 10/29/2017 Mild LVH, EF 40-98, normal diastolic function, trivial AI, moderate LAE  Nuclear stress test 10/29/2017 EF 50, fixed septal/anteroseptal, lateral/inferolateral defect suggesting artifact, no ischemia, low risk  ETT-Myoview (03/14/13): Ex 9:00, ECG with 1 mm ST depression inferiorly, primarily reversible medium-sized anteroseptal defect, EF 54% (intermediate risk).  Carotid US (04/2012): Mild plaque bilaterally (0-39%) =>repeat in 1 year.  Past Medical History:  Diagnosis Date  . CAD (coronary artery disease), native coronary artery    Multiple PCI procedures followed by CABG in 2009 with LIMA-LAD, SVG-D, seq SVG-OM and PLV, SVG-PDA. Echo (3/12): EF 60-65%, mild MR. // Nuc Stress Test 7/19:  EF 50, artifact, no ischemia, low risk   . Carotid stenosis    dopplers 1/14:  0-39% bilat  . Cataract    REMOVED  . Colon polyps 2006   Colonoscopy-Dr. Patterson(Tubular Adenoma)   . DM2 (diabetes mellitus, type 2) ([place])    Followed by Dr. Carlis Abbott  . History of echocardiogram    Echo 7/19: Mild LVH, EF 11-91, normal diastolic function, trivial AI, moderate LAE  . HLD (hyperlipidemia)   . HTN (hypertension)   . Hx of cardiovascular stress test    ETT-Myoview (03/2013):  Anteroseptal reversible defect, EF 54%, intermediate risk.  [name] Postoperative atrial fibrillation ([place]) 2009  . Ulcer    left foot  1. CAD: Multiple PCI procedures followed by CABG in 2009 with LIMA-LAD, SVG-D, seq  SVG-OM and PLV, SVG-PDA.Echo (3/12): EF 60-65%, mild [name]-Cardiolite in 12/14 with a reversible basal to mid anteroseptal defect with EF 54%.  2. Insulin-dependent diabetes: Followed by Dr. Carlis Abbott.Has diabetic foot ulcer.  3. Hypertension.  4. Hyperlipidemia.  5. Postoperative atrial fibrillation after CABG in 2009  6. Carotid dopplers (1/14) with 0-39% bilateral ICA stenosis.  Surgical Hx: The patient  has a past surgical history that includes Coronary  artery bypass graft (2009); Cardiac catheterization (2009); Back surgery (2006); Appendectomy (2005); Tonsillectomy; and Colonoscopy.   Current Medications: Current Meds  Medication Sig  . amitriptyline (ELAVIL) 75 MG tablet Take 75 mg by mouth at bedtime.  [name] amLODipine (NORVASC) 10 MG tablet TAKE 1 TABLET (10 MG TOTAL) BY MOUTH DAILY. PLEASE MAKE OVERDUE APPT WITH DR. Burt Knack  . aspirin EC 325 MG EC tablet Take 1 tablet (325 mg total) by mouth daily.  . benzonatate (TESSALON) 100 MG capsule Take 100 mg by mouth every 8 (eight) hours.  . clopidogrel (PLAVIX) 75 MG tablet Take 1 tablet (75 mg total) by mouth daily. Please keep upcoming appt in December before anymore refills. Thank you  . ezetimibe (ZETIA) 10 MG tablet Take 1 tablet (10 mg total) by mouth daily. Please make overdue appt with Dr. Burt Knack before anymore refills. 2nd attempt  . fenofibrate 54 MG tablet Take 1 tablet (54 mg total) by mouth daily.  . furosemide (LASIX) 40 MG tablet Take 1 tablet (40 mg total) by mouth daily. Please keep upcoming appt in December before anymore refills. Thank you  . metoprolol tartrate (LOPRESSOR) 100 MG tablet Take 1 tablet (100 mg total) by mouth 2 (two) times daily. Please make overdue appt with Dr. Burt Knack before anymore refills. 2nd attempt  . nitroGLYCERIN (NITROSTAT) 0.4 MG SL tablet Place 1 tablet (0.4 mg total) under the tongue every 5 (five) minutes as needed for chest pain.  [name] NOVOLOG MIX 70/30 FLEXPEN (70-30) 100 UNIT/ML Pen Inject 40-60 Units into the skin 2 (two) times daily. Per sliding scale  . rosuvastatin (CRESTOR) 5 MG tablet Take 2 tablets (10 mg total) by mouth daily at 6 PM.  . TRADJENTA 5 MG TABS tablet Take 5 mg by mouth daily.  [name] VICTOZA 18 MG/3ML SOPN Inject 1.8 mg into the skin daily.   . [DISCONTINUED] rosuvastatin (CRESTOR) 40 MG tablet Take 1 tablet (40 mg total) by mouth daily at 6 PM. Please keep upcoming appt in December before anymore refills. Thank you (Patient taking  differently: Take 80 mg by mouth daily at 6 PM. Please keep upcoming appt in December before anymore refills. Thank you)     Allergies:   Penicillins   Social History   Tobacco Use  . Smoking status: Never Smoker  . Smokeless tobacco: Former Systems developer    Types: Chew  Substance Use Topics  . Alcohol use: No  . Drug use: No     Family Hx: The patient's family history includes Colon cancer in his mother; Coronary artery disease in an other family member; Diabetes in his brother and father; Heart disease in an other family member; Other in his brother and brother; Prostate cancer in his brother and father.  ROS:   Please see the history of present illness.    ROS All other systems reviewed and are negative.   EKGs/Labs/Other Test Reviewed:    EKG:  EKG is not ordered today.  The ekg obtained in the hospital 01/30/2019 demonstrated normal sinus rhythm, heart rate 68, inferior Q waves, nonspecific ST-T  wave changes, QTC 435  Recent Labs: 01/30/2019: ALT 29; BUN 31; Creatinine, Ser 1.85; Hemoglobin 16.4; Platelets 283; Potassium 3.7; Sodium 133   Recent Lipid Panel Lab Results  Component Value Date/Time   CHOL 150 01/31/2019 04:44 AM   CHOL 146 10/25/2017 09:44 AM   TRIG 257 (H) 01/31/2019 04:44 AM   HDL 36 (L) 01/31/2019 04:44 AM   HDL 36 (L) 10/25/2017 09:44 AM   CHOLHDL 4.2 01/31/2019 04:44 AM   LDLCALC 63 01/31/2019 04:44 AM   LDLCALC 47 10/25/2017 09:44 AM   LDLDIRECT 78.0 11/26/2014 10:10 AM    Physical Exam:    VS:  BP 112/68   Pulse 66   Ht 5' 10"  (1.778 m)   Wt 232 lb (105.2 kg)   SpO2 97%   BMI 33.29 kg/m     Wt Readings from Last 3 Encounters:  03/29/19 232 lb (105.2 kg)  01/30/19 235 lb (106.6 kg)  12/10/17 237 lb 6.4 oz (107.7 kg)     Physical Exam  Constitutional: He is oriented to person, place, and time. He appears well-developed and well-nourished. No distress.  HENT:  Head: Normocephalic and atraumatic.  Eyes: No scleral icterus.  Neck: No JVD  present. No thyromegaly present.  Cardiovascular: Normal rate, regular rhythm and normal heart sounds.  No murmur heard. Pulmonary/Chest: Effort normal and breath sounds normal. He has no rales.  Abdominal: Soft. There is no hepatomegaly.  Musculoskeletal:        General: Edema (1+ bilat LE edema) present.  Lymphadenopathy:    He has no cervical adenopathy.  Neurological: He is alert and oriented to person, place, and time.  Skin: Skin is warm and dry.  Psychiatric: He has a normal mood and affect.    ASSESSMENT & PLAN:    1. Coronary artery disease involving native coronary artery of native heart without angina pectoris History of CABG in 2009.  Nuclear stress test in 2019 was low risk.  He has occasional chest discomfort.  This is not related to exertion or reminiscent of his previous angina.  Recent ECG was unchanged.  No further testing indicated at this time.  He knows to contact me if his symptoms should change.  Continue current therapy which includes aspirin, clopidogrel, ezetimibe, rosuvastatin.  2. Essential hypertension The patient's blood pressure is controlled on his current regimen.  Continue current therapy.   3. Pure hypercholesterolemia LDL at goal during recent hospitalization for stroke.  Triglycerides were elevated at 257.  His rosuvastatin dose was adjusted at that time.  Arrange follow-up CMET, fasting lipids.  If triglycerides remain elevated, consider adjusting fenofibrate dose versus adding Vascepa.  4. PAD (peripheral artery disease) (HCC) No claudication.  Continue current therapy.  5. History of stroke He wore an event monitor for 30 days.  We will try to contact his primary care provider's office for the results.  If his event monitor did not demonstrate evidence of atrial fibrillation, consider referral to EP for consideration of ILR.  6.  Sleep apnea CPAP pending   Dispo:  Return in about 6 months (around 09/27/2019) for Routine Follow Up, w/ [name], PA-C, in person.   Medication Adjustments/Labs and Tests Ordered: Current medicines are reviewed at length with the patient today.  Concerns regarding medicines are outlined above.  Tests Ordered: Orders Placed This Encounter  Procedures  . Comp Met (CMET)  . Lipid Profile   Medication Changes: Meds ordered this encounter  Medications  . rosuvastatin (CRESTOR) 5 MG  tablet    Sig: Take 2 tablets (10 mg total) by mouth daily at 6 PM.    Dispense:  180 tablet    Refill:  1    Signed, [name], PA-C  03/29/2019 9:49 AM    [facility] [address], [place], [place]  [address] Phone: [phone]; Fax: [phone]

## 2019-03-29 ENCOUNTER — Other Ambulatory Visit: Payer: Self-pay

## 2019-03-29 ENCOUNTER — Encounter: Payer: Self-pay | Admitting: Physician Assistant

## 2019-03-29 ENCOUNTER — Ambulatory Visit (INDEPENDENT_AMBULATORY_CARE_PROVIDER_SITE_OTHER): Payer: Medicare Other | Admitting: Physician Assistant

## 2019-03-29 VITALS — BP 112/68 | HR 66 | Ht 70.0 in | Wt 232.0 lb

## 2019-03-29 DIAGNOSIS — I2510 Atherosclerotic heart disease of native coronary artery without angina pectoris: Secondary | ICD-10-CM | POA: Diagnosis not present

## 2019-03-29 DIAGNOSIS — Z8673 Personal history of transient ischemic attack (TIA), and cerebral infarction without residual deficits: Secondary | ICD-10-CM

## 2019-03-29 DIAGNOSIS — G4733 Obstructive sleep apnea (adult) (pediatric): Secondary | ICD-10-CM

## 2019-03-29 DIAGNOSIS — I10 Essential (primary) hypertension: Secondary | ICD-10-CM

## 2019-03-29 DIAGNOSIS — E7800 Pure hypercholesterolemia, unspecified: Secondary | ICD-10-CM | POA: Diagnosis not present

## 2019-03-29 DIAGNOSIS — I739 Peripheral vascular disease, unspecified: Secondary | ICD-10-CM

## 2019-03-29 MED ORDER — ROSUVASTATIN CALCIUM 5 MG PO TABS: 10.0000 mg | ORAL_TABLET | Freq: Every day | ORAL | 1 refills | Status: DC

## 2019-03-29 NOTE — Patient Instructions (Addendum)
Medication Instructions:   Your physician has recommended you make the following change in your medication:   1) Check your Crestor when you get home and make sure you are taking 5MG , 2 tablets by mouth once a day. Call [phone] or send a mychart message to let us know.  *If you need a refill on your cardiac medications before your next appointment, please call your pharmacy*  Lab Work:  Your physician recommends that you return for lab work on 04/19/19 for fasting lipids and CMET.  If you have labs (blood work) drawn today and your tests are completely normal, you will receive your results only by: [name] MyChart Message (if you have MyChart) OR . A paper copy in the mail If you have any lab test that is abnormal or we need to change your treatment, we will call you to review the results.  Testing/Procedures:  None ordered today  Follow-Up:  On 09/27/19 at 8:15AM with [name], PA-C

## 2019-04-04 ENCOUNTER — Encounter: Payer: Self-pay | Admitting: Family Medicine

## 2019-04-04 ENCOUNTER — Ambulatory Visit (INDEPENDENT_AMBULATORY_CARE_PROVIDER_SITE_OTHER): Payer: Medicare Other | Admitting: Family Medicine

## 2019-04-04 ENCOUNTER — Other Ambulatory Visit: Payer: Self-pay

## 2019-04-04 ENCOUNTER — Telehealth: Payer: Self-pay | Admitting: Physician Assistant

## 2019-04-04 ENCOUNTER — Telehealth: Payer: Self-pay

## 2019-04-04 VITALS — BP 140/80 | HR 56 | Temp 97.8°F | Ht 68.0 in | Wt 242.8 lb

## 2019-04-04 DIAGNOSIS — I2510 Atherosclerotic heart disease of native coronary artery without angina pectoris: Secondary | ICD-10-CM

## 2019-04-04 DIAGNOSIS — I639 Cerebral infarction, unspecified: Secondary | ICD-10-CM

## 2019-04-04 DIAGNOSIS — E118 Type 2 diabetes mellitus with unspecified complications: Secondary | ICD-10-CM | POA: Diagnosis not present

## 2019-04-04 DIAGNOSIS — E7800 Pure hypercholesterolemia, unspecified: Secondary | ICD-10-CM

## 2019-04-04 DIAGNOSIS — G4733 Obstructive sleep apnea (adult) (pediatric): Secondary | ICD-10-CM

## 2019-04-04 DIAGNOSIS — Z794 Long term (current) use of insulin: Secondary | ICD-10-CM

## 2019-04-04 DIAGNOSIS — I10 Essential (primary) hypertension: Secondary | ICD-10-CM | POA: Diagnosis not present

## 2019-04-04 NOTE — Progress Notes (Signed)
New Patient Office Visit  Subjective:  Patient ID: [name], male    DOB: Jul 21, 1950  Age: 68 y.o. MRN: [id number]  CC:  Chief Complaint  Patient presents with  . Establish Care  h/o CVA-h/o holter monitor Sleep apnea  HPI [name] presents for DM-victoza/trajenta/novolog 70/30-glucose -fasting 210-seen in [place]-[facility]  Coronary artery disease  ? S/p CABG in 2009  Hypertension   Hyperlipidemia   Diabetes mellitus   Hx of diabetic foot ulcer  PAD  ? eval by Dr. Fletcher Anon in 2019 >> small vessel dz >> med Rx   S/p cryptogenic CVA 01/2019 ? 2 vascular territories (R temporoparietal and occipital) - ?cardioembolic  ? Event monitor done by PCP-no report currently Echocardiogram 02/01/2019 EF 60-65, mod LVH, mildly reduced RVSF, trace MR, mild TR  Carotid US 01/31/2019 IMPRESSION: Color duplex indicates minimal heterogeneous and calcified plaque, with no hemodynamically significant stenosis by duplex criteria in the extracranial cerebrovascular circulation.  Mr. Moltz was last seen in 12/2017.  Since then, he was admitted to [facility] in 01/2019 with a cryptogenic stroke.  There was concern for cardioembolic source.  EF was normal by echocardiogram.  There was no significant disease on carotid US.  His PCP had him wear a 30 day monitor. Hyperlipidemia-Crestor 42m-taking 149m/day-labwork scheduled with cardiology  Neuro evaluation- Study study-Moderate to severe periodic limb movement disorder is noted. IMPRESSIONS 1.  Severe obstructive sleep apnea syndrome is noted with this study.  The optimal with this study is 9 but significant residual events were noted.  Therefore, the recommended pressure is 10. 2.  Moderate to severe periodic limb movements are noted.   Past Medical History:  Diagnosis Date  . Allergy   . CAD (coronary artery disease), native coronary artery    Multiple PCI procedures followed by CABG in 2009 with LIMA-LAD,  SVG-D, seq SVG-OM and PLV, SVG-PDA. Echo (3/12): EF 60-65%, mild MR. // Nuc Stress Test 7/19:  EF 50, artifact, no ischemia, low risk   . Carotid stenosis    dopplers 1/14:  0-39% bilat  . Cataract    REMOVED  . Colon polyps 2006   Colonoscopy-Dr. Patterson(Tubular Adenoma)   . DM2 (diabetes mellitus, type 2) ([place]   Followed by Dr. ClCarlis Abbott. History of echocardiogram    Echo 7/19: Mild LVH, EF 5528-41normal diastolic function, trivial AI, moderate LAE  . HLD (hyperlipidemia)   . HTN (hypertension)   . Hx of cardiovascular stress test    ETT-Myoview (03/2013):  Anteroseptal reversible defect, EF 54%, intermediate risk.  . Marland Kitchenostoperative atrial fibrillation ([place]2009  . Stroke ([place]  . Ulcer    left foot    Past Surgical History:  Procedure Laterality Date  . APPENDECTOMY  2005  . BACK SURGERY  2006  . CARDIAC CATHETERIZATION  2009   EF 60%  . COLONOSCOPY    . CORONARY ARTERY BYPASS GRAFT  2009   post op. a fib, LIMA -LAD, SVG-D,seq SVG-OM and PLV,  SVG-PDA.  . Marland KitchenYE SURGERY    . SPINE SURGERY    . TONSILLECTOMY      Family History  Problem Relation Age of Onset  . Coronary artery disease Other        family history  . Heart disease Other        family history  . Other Brother        CABG  . Other Brother  CABG  . Prostate cancer Father   . Diabetes Father   . Heart disease Father   . Hyperlipidemia Father   . Hypertension Father   . Prostate cancer Brother   . Diabetes Brother        x 2  . Colon cancer Mother   . Cancer Mother   . Diabetes Mother   . Heart disease Mother   . Hyperlipidemia Mother     Social History   Socioeconomic History  . Marital status: Married    Spouse name: Not on file  . Number of children: 0  . Years of education: Not on file  . Highest education level: Not on file  Occupational History  . Occupation: Radio broadcast assistant    Comment: Owner  Tobacco Use  . Smoking status: Never Smoker  . Smokeless tobacco:  Former Systems developer    Types: Chew  Substance and Sexual Activity  . Alcohol use: No  . Drug use: No  . Sexual activity: Not Currently  Other Topics Concern  . 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   Originally from [place].   Social Determinants of Health   Financial Resource Strain: Low Risk   . Difficulty of Paying Living Expenses: Not hard at all  Food Insecurity: Unknown  . Worried About Charity fundraiser in the Last Year: Patient refused  . Ran Out of Food in the Last Year: Patient refused  Transportation Needs: Unknown  . Lack of Transportation (Medical): Patient refused  . Lack of Transportation (Non-Medical): Patient refused  Physical Activity: Sufficiently Active  . Days of Exercise per Week: 5 days  . Minutes of Exercise per Session: 150+ min  Stress: No Stress Concern Present  . Feeling of Stress : Not at all  Social Connections: Not Isolated  . Frequency of Communication with Friends and Family: More than three times a week  . Frequency of Social Gatherings with Friends and Family: Once a week  . Attends Religious Services: 1 to 4 times per year  . Active Member of Clubs or Organizations: Yes  . Attends Archivist Meetings: 1 to 4 times per year  . Marital Status: Married  Human resources officer Violence: Unknown  . Fear of Current or Ex-Partner: Patient refused  . Emotionally Abused: Patient refused  . Physically Abused: Patient refused  . Sexually Abused: Patient refused    ROS Review of Systems  Constitutional: Negative.   HENT: Positive for dental problem.   Eyes: Negative.   Respiratory: Positive for apnea.   Cardiovascular: Positive for leg swelling.  Gastrointestinal: Positive for constipation and diarrhea.  Endocrine:       DM  Genitourinary: Negative.   Musculoskeletal: Positive for arthralgias and joint swelling.  Skin: Negative.   Allergic/Immunologic: Negative.    Neurological:       CVA  Hematological: Bruises/bleeds easily.  Psychiatric/Behavioral: Negative.     Objective:   Today's Vitals: BP 140/80 (BP Location: Left Arm, Patient Position: Sitting, Cuff Size: Normal)   Pulse (!) 56   Temp 97.8 F (36.6 C) (Oral)   Ht 5' 8"  (1.727 m)   Wt 242 lb 12.8 oz (110.1 kg)   SpO2 97%   BMI 36.92 kg/m   Physical Exam Constitutional:      Appearance: Normal appearance.  HENT:     Head: Normocephalic and atraumatic.     Nose: Nose normal.  Eyes:  Conjunctiva/sclera: Conjunctivae normal.  Cardiovascular:     Rate and Rhythm: Normal rate and regular rhythm.     Pulses: Normal pulses.     Heart sounds: Normal heart sounds.  Pulmonary:     Effort: Pulmonary effort is normal.     Breath sounds: Normal breath sounds.  Musculoskeletal:     Cervical back: Normal range of motion and neck supple.  Neurological:     Mental Status: He is alert and oriented to person, place, and time.  Psychiatric:        Mood and Affect: Mood normal.        Behavior: Behavior normal.     Assessment & Plan:  1. Type 2 diabetes mellitus with complication, with long-term current use of insulin (HCC) endo 2. Essential hypertension cardo following-check pulse with blood pressure Amlodipine-stable-trace edema Metoprolol-stable  Lasix-stable 3. Pure hypercholesterolemia crestor 47m (2)-blood work-per cardio zetia fenfibrates-triglycerides 4. Atherosclerosis of native coronary artery of native heart without angina pectoris holter monitor pending 5. Cerebrovascular accident (CVA), unspecified mechanism ([place] plavix/asa Watch for any signs of repeat 6. OSA (obstructive sleep apnea) CPAP recommended  Outpatient Encounter Medications as of 04/04/2019  Medication Sig  . glucose monitoring kit (FREESTYLE) monitoring kit 1 each by Does not apply route as needed for other. Freestyle Libre  . amitriptyline (ELAVIL) 75 MG tablet Take 75 mg by mouth at bedtime.   .Marland KitchenamLODipine (NORVASC) 10 MG tablet TAKE 1 TABLET (10 MG TOTAL) BY MOUTH DAILY. PLEASE MAKE OVERDUE APPT WITH DR. CBurt Knack . aspirin EC 325 MG EC tablet Take 1 tablet (325 mg total) by mouth daily.  . clopidogrel (PLAVIX) 75 MG tablet Take 1 tablet (75 mg total) by mouth daily. Please keep upcoming appt in December before anymore refills. Thank you  . ezetimibe (ZETIA) 10 MG tablet Take 1 tablet (10 mg total) by mouth daily. Please make overdue appt with Dr. CBurt Knackbefore anymore refills. 2nd attempt  . fenofibrate 54 MG tablet Take 1 tablet (54 mg total) by mouth daily.  . furosemide (LASIX) 40 MG tablet Take 1 tablet (40 mg total) by mouth daily. Please keep upcoming appt in December before anymore refills. Thank you  . metoprolol tartrate (LOPRESSOR) 100 MG tablet Take 1 tablet (100 mg total) by mouth 2 (two) times daily. Please make overdue appt with Dr. CBurt Knackbefore anymore refills. 2nd attempt  . nitroGLYCERIN (NITROSTAT) 0.4 MG SL tablet Place 1 tablet (0.4 mg total) under the tongue every 5 (five) minutes as needed for chest pain.  .Marland KitchenNOVOLOG MIX 70/30 FLEXPEN (70-30) 100 UNIT/ML Pen Inject 40-60 Units into the skin 2 (two) times daily. Per sliding scale  . rosuvastatin (CRESTOR) 5 MG tablet Take 2 tablets (10 mg total) by mouth daily at 6 PM.  . TRADJENTA 5 MG TABS tablet Take 5 mg by mouth daily.  .Marland KitchenVICTOZA 18 MG/3ML SOPN Inject 1.8 mg into the skin daily.   . [DISCONTINUED] benzonatate (TESSALON) 100 MG capsule Take 100 mg by mouth every 8 (eight) hours.   No facility-administered encounter medications on file as of 04/04/2019.   Follow-up: keep follow up appointments Monitor blood pressure Adlai Nieblas LHannah Beat MD

## 2019-04-04 NOTE — Telephone Encounter (Signed)
Looking for monitor results and tracing. Please call her today if possible

## 2019-04-04 NOTE — Telephone Encounter (Signed)
[name] from [place] [facility] is looking for EOS.Patient told front desk staff he mailed monitor back.

## 2019-04-04 NOTE — Telephone Encounter (Signed)
Called pt to find out where he had the [facility] put on and where the resulting site would be.  He said Dr Luan Pulling ordered it and it was mailed to him and he ups it back to the company around 12/14. He will call me back with the company name and #.  I called Arbie Cookey at Dr Luan Pulling office and she said all of this goes to [facility] and they should have the results.  Nothing is in Epic yet. [phone])  I called [facility] and the nurse I need to talk with is in a room and she will call me back.  [phone])

## 2019-04-04 NOTE — Telephone Encounter (Signed)
[employer] has monitor,per Eldridge Scot, English as a second language teacher. It was not populated. STAT request to have EOS faxed to our office     Will FYI Dr.Corum

## 2019-04-05 NOTE — Telephone Encounter (Signed)
Rosaria Ferries called back asking that someone contact her about obtaining monitor results and report

## 2019-04-05 NOTE — Telephone Encounter (Signed)
Await event monitor to be read by MD.I will call marion when resulted.

## 2019-04-05 NOTE — Telephone Encounter (Signed)
Event monitor results forwarded to Dr.Corum

## 2019-04-09 ENCOUNTER — Other Ambulatory Visit: Payer: Self-pay | Admitting: Physician Assistant

## 2019-04-10 ENCOUNTER — Other Ambulatory Visit: Payer: Self-pay

## 2019-04-10 NOTE — Telephone Encounter (Signed)
Just to verify, you wanted patient on 10MG  of Rosuvastatin a day correct? He is saying there was a prescription for 40MG  just sent in. I do not see that we sent in 40, but 5MG , 2 tablets once a day.

## 2019-04-11 MED ORDER — ROSUVASTATIN CALCIUM 5 MG PO TABS: 10.0000 mg | ORAL_TABLET | Freq: Every day | ORAL | 1 refills | Status: DC

## 2019-04-11 NOTE — Telephone Encounter (Signed)
Yes.  Rosuvastatin should be 10 mg once daily.   Thank you! Tereso Newcomer, PA-C    04/11/2019 3:02 PM

## 2019-04-11 NOTE — Telephone Encounter (Signed)
Prescription for 5MG  Rosuvastatin 2 tablets by mouth once a [facility] and [facility] aware [facility] is no longer on 40MG .

## 2019-04-18 ENCOUNTER — Other Ambulatory Visit: Payer: Self-pay | Admitting: Cardiovascular Disease

## 2019-04-18 ENCOUNTER — Other Ambulatory Visit: Payer: Self-pay | Admitting: Physician Assistant

## 2019-04-19 ENCOUNTER — Other Ambulatory Visit: Payer: Self-pay

## 2019-04-19 ENCOUNTER — Other Ambulatory Visit: Payer: Medicare Other | Admitting: *Deleted

## 2019-04-19 DIAGNOSIS — Z8673 Personal history of transient ischemic attack (TIA), and cerebral infarction without residual deficits: Secondary | ICD-10-CM

## 2019-04-19 DIAGNOSIS — I10 Essential (primary) hypertension: Secondary | ICD-10-CM

## 2019-04-19 DIAGNOSIS — I739 Peripheral vascular disease, unspecified: Secondary | ICD-10-CM

## 2019-04-19 DIAGNOSIS — E7800 Pure hypercholesterolemia, unspecified: Secondary | ICD-10-CM

## 2019-04-19 DIAGNOSIS — I2510 Atherosclerotic heart disease of native coronary artery without angina pectoris: Secondary | ICD-10-CM

## 2019-04-19 LAB — COMPREHENSIVE METABOLIC PANEL
ALT: 23 IU/L (ref 0–44)
AST: 26 IU/L (ref 0–40)
Albumin/Globulin Ratio: 2.4 — ABNORMAL HIGH (ref 1.2–2.2)
Albumin: 4.4 g/dL (ref 3.8–4.8)
Alkaline Phosphatase: 80 IU/L (ref 39–117)
BUN/Creatinine Ratio: 11 (ref 10–24)
BUN: 17 mg/dL (ref 8–27)
Bilirubin Total: 0.4 mg/dL (ref 0.0–1.2)
CO2: 23 mmol/L (ref 20–29)
Calcium: 9.6 mg/dL (ref 8.6–10.2)
Chloride: 99 mmol/L (ref 96–106)
Creatinine, Ser: 1.5 mg/dL — ABNORMAL HIGH (ref 0.76–1.27)
GFR calc Af Amer: 55 mL/min/{1.73_m2} — ABNORMAL LOW (ref >59)
GFR calc non Af Amer: 48 mL/min/{1.73_m2} — ABNORMAL LOW (ref >59)
Globulin, Total: 1.8 g/dL (ref 1.5–4.5)
Glucose: 319 mg/dL — ABNORMAL HIGH (ref 65–99)
Potassium: 4.5 mmol/L (ref 3.5–5.2)
Sodium: 136 mmol/L (ref 134–144)
Total Protein: 6.2 g/dL (ref 6.0–8.5)

## 2019-04-19 LAB — LIPID PANEL
Chol/HDL Ratio: 4.4 ratio (ref 0.0–5.0)
Cholesterol, Total: 159 mg/dL (ref 100–199)
HDL: 36 mg/dL — ABNORMAL LOW (ref >39)
LDL Chol Calc (NIH): 87 mg/dL (ref 0–99)
Triglycerides: 211 mg/dL — ABNORMAL HIGH (ref 0–149)
VLDL Cholesterol Cal: 36 mg/dL (ref 5–40)

## 2019-04-20 ENCOUNTER — Other Ambulatory Visit: Payer: Self-pay

## 2019-04-20 MED ORDER — ROSUVASTATIN CALCIUM 10 MG PO TABS: 10.0000 mg | ORAL_TABLET | Freq: Every day | ORAL | 1 refills | Status: DC

## 2019-05-04 DIAGNOSIS — E113293 Type 2 diabetes mellitus with mild nonproliferative diabetic retinopathy without macular edema, bilateral: Secondary | ICD-10-CM | POA: Diagnosis not present

## 2019-05-09 ENCOUNTER — Other Ambulatory Visit: Payer: Self-pay | Admitting: Physician Assistant

## 2019-05-09 DIAGNOSIS — I2510 Atherosclerotic heart disease of native coronary artery without angina pectoris: Secondary | ICD-10-CM

## 2019-05-09 DIAGNOSIS — E7800 Pure hypercholesterolemia, unspecified: Secondary | ICD-10-CM

## 2019-05-09 MED ORDER — ROSUVASTATIN CALCIUM 20 MG PO TABS: 20.0000 mg | ORAL_TABLET | Freq: Every day | ORAL | 1 refills | Status: DC

## 2019-05-09 NOTE — Telephone Encounter (Signed)
*  STAT* If patient is at the pharmacy, call can be transferred to refill team.   1. Which medications need to be refilled? (please list name of each medication and dose if known)  rosuvastatin (CRESTOR) 10 MG tablet  2. Which pharmacy/location (including street and city if local pharmacy) is medication to be sent to? [id number] [place] [place] [facility] [address] AT [address]  3. Do they need a 30 day or 90 day supply? 90  Patient states that [facility] increased his dosage of this mediation from 10 mg daily to 20 mg daily. The increased dosage has caused him to run out of his medication and his insurance will not pay to have it refilled sooner.   He would like assistance from our office to get a new rx sent to the pharmacy.  The patient also says his previous phone will be shut off, and to only use the newphone number from this point forward

## 2019-05-09 NOTE — Telephone Encounter (Signed)
-----   Message from Beatrice Lecher, [place] sent at 04/19/2019  5:39 PM EST ----- Glucose elevated.  Creatinine stable.  Potassium, LFTs normal.  Triglycerides elevated.  LDL above goal (<70). PLAN:  -Increase rosuvastatin to 20 mg daily -Follow-up with PCP for diabetes; send copy of labs to PCP -Repeat fasting lipids, LFTs in 3 months. Tereso Newcomer, PA-C    04/19/2019 5:33 PM

## 2019-05-09 NOTE — Telephone Encounter (Signed)
Patient returned my call and will come back on 08/07/19 to have repeat labs. New prescription for Crestor 20 mg, 1 tablet by mouth once a day sent to pharmacy. Orders in for lab work.

## 2019-06-15 ENCOUNTER — Ambulatory Visit: Payer: Medicare Other | Attending: Internal Medicine

## 2019-06-15 DIAGNOSIS — Z23 Encounter for immunization: Secondary | ICD-10-CM

## 2019-06-15 NOTE — Progress Notes (Signed)
   Covid-19 Vaccination Clinic  Name:  [name]    MRN: [id number] DOB: 29-Jul-1950  06/15/2019  [name] was observed post Covid-19 immunization for 15 minutes without incident. He was provided with Vaccine Information Sheet and instruction to access the V-Safe system.   [name] was instructed to call 911 with any severe reactions post vaccine: [name] Difficulty breathing  . Swelling of face and throat  . A fast heartbeat  . A bad rash all over body  . Dizziness and weakness   Immunizations Administered    Name Date Dose VIS Date Route   Moderna COVID-19 Vaccine 06/15/2019  9:08 AM 0.5 mL 03/07/2019 Intramuscular   Manufacturer: Moderna   Lot: [id number]   NDC: 93716-967-89

## 2019-06-19 ENCOUNTER — Other Ambulatory Visit: Payer: Self-pay | Admitting: Family Medicine

## 2019-06-19 ENCOUNTER — Telehealth: Payer: Self-pay | Admitting: Family Medicine

## 2019-06-19 DIAGNOSIS — E1165 Type 2 diabetes mellitus with hyperglycemia: Secondary | ICD-10-CM | POA: Diagnosis not present

## 2019-06-19 DIAGNOSIS — I2510 Atherosclerotic heart disease of native coronary artery without angina pectoris: Secondary | ICD-10-CM | POA: Diagnosis not present

## 2019-06-19 DIAGNOSIS — I10 Essential (primary) hypertension: Secondary | ICD-10-CM | POA: Diagnosis not present

## 2019-06-19 DIAGNOSIS — E1140 Type 2 diabetes mellitus with diabetic neuropathy, unspecified: Secondary | ICD-10-CM | POA: Diagnosis not present

## 2019-06-19 DIAGNOSIS — N189 Chronic kidney disease, unspecified: Secondary | ICD-10-CM | POA: Diagnosis not present

## 2019-06-19 DIAGNOSIS — E7800 Pure hypercholesterolemia, unspecified: Secondary | ICD-10-CM | POA: Diagnosis not present

## 2019-06-19 DIAGNOSIS — I639 Cerebral infarction, unspecified: Secondary | ICD-10-CM | POA: Diagnosis not present

## 2019-06-19 DIAGNOSIS — G4733 Obstructive sleep apnea (adult) (pediatric): Secondary | ICD-10-CM

## 2019-06-19 NOTE — Telephone Encounter (Signed)
Pt is needing a referral to a pulmonary doctor so he can get a form filled out for his CPAP machine per insurance

## 2019-06-19 NOTE — Telephone Encounter (Signed)
Please advise 

## 2019-06-20 DIAGNOSIS — I6930 Unspecified sequelae of cerebral infarction: Secondary | ICD-10-CM | POA: Diagnosis not present

## 2019-06-20 DIAGNOSIS — G4733 Obstructive sleep apnea (adult) (pediatric): Secondary | ICD-10-CM | POA: Diagnosis not present

## 2019-06-20 DIAGNOSIS — I10 Essential (primary) hypertension: Secondary | ICD-10-CM | POA: Diagnosis not present

## 2019-06-20 DIAGNOSIS — T466X5A Adverse effect of antihyperlipidemic and antiarteriosclerotic drugs, initial encounter: Secondary | ICD-10-CM | POA: Diagnosis not present

## 2019-06-23 ENCOUNTER — Other Ambulatory Visit: Payer: Self-pay | Admitting: Physician Assistant

## 2019-07-18 ENCOUNTER — Ambulatory Visit: Payer: Medicare Other | Attending: Internal Medicine

## 2019-07-18 DIAGNOSIS — Z23 Encounter for immunization: Secondary | ICD-10-CM

## 2019-07-18 NOTE — Progress Notes (Signed)
   Covid-19 Vaccination Clinic  Name:  Hanz Winterhalter    MRN: [id number] DOB: [birth year]  07/18/2019  Mr. Bencomo was observed post Covid-19 immunization for 15 minutes without incident. He was provided with Vaccine Information Sheet and instruction to access the V-Safe system.   Mr. Mah was instructed to call 911 with any severe reactions post vaccine: [name] Difficulty breathing  . Swelling of face and throat  . A fast heartbeat  . A bad rash all over body  . Dizziness and weakness   Immunizations Administered    Name Date Dose VIS Date Route   Moderna COVID-19 Vaccine 07/18/2019  8:20 AM 0.5 mL 03/07/2019 Intramuscular   Manufacturer: Moderna   Lot: [id number]   NDC: 37990-940-00

## 2019-07-21 ENCOUNTER — Other Ambulatory Visit: Payer: Self-pay | Admitting: Physician Assistant

## 2019-07-26 ENCOUNTER — Other Ambulatory Visit: Payer: Self-pay

## 2019-07-26 ENCOUNTER — Encounter: Payer: Medicare Other | Attending: Family Medicine | Admitting: Dietician

## 2019-07-26 ENCOUNTER — Encounter: Payer: Self-pay | Admitting: Dietician

## 2019-07-26 DIAGNOSIS — Z794 Long term (current) use of insulin: Secondary | ICD-10-CM | POA: Diagnosis not present

## 2019-07-26 DIAGNOSIS — E118 Type 2 diabetes mellitus with unspecified complications: Secondary | ICD-10-CM | POA: Insufficient documentation

## 2019-07-26 NOTE — Progress Notes (Signed)
Diabetes Self-Management Education  Visit Type: First/Initial  Appt. Start Time: 8:00am  Appt. End Time: 9:30am  07/26/2019  [name], identified by name and date of birth, is a 69 y.o. male with a diagnosis of Diabetes: Type 2.   ASSESSMENT  Weight 254 lb (115.2 kg). Body mass index is 38.62 kg/m.   Patient states he is motivated to manage his diabetes and would like to prevent further "damage" from happening to his body due to the diabetes. States he knows he could eat better and that he is ultimately in control of his diabetes management. Willing to commit and make necessary changes. States that, throughout the pandemic last year, he was at home a lot more and did not make the best food choices. States he often overeats on the weekends.   Asked good questions about the timing of his insulin and how much he should take. I confirmed this needs to be discussed with his prescribing doctor. States he currently takes insulin when he wakes up, about 1 hour before lunch, and at around 5:00pm or a few hours before eating dinner. States that at one point, he was taken off Metformin due to his liver function and was prescribed Victoza which did not help his blood sugar readings. States he is now back on Metformin which is working.   Diabetes Self-Management Education - 07/26/19 0808      Visit Information   Visit Type  First/Initial      Initial Visit   Diabetes Type  Type 2    Are you currently following a meal plan?  No    Are you taking your medications as prescribed?  Yes    Date Diagnosed  12-14 years ago      Health Coping   How would you rate your overall health?  Poor      Psychosocial Assessment   Patient Belief/Attitude about Diabetes  Defeat/Burnout    Self-care barriers  None    Self-management support  Family   wife   Other persons present  Patient    Patient Concerns  Nutrition/Meal planning;Glycemic Control;Monitoring    Special Needs  None    Preferred  Learning Style  No preference indicated    Learning Readiness  Contemplating    How often do you need to have someone help you when you read instructions, pamphlets, or other written materials from your doctor or pharmacy?  1 - Never    What is the last grade level you completed in school?  40HK      Complications   Last HgB A1C per patient/outside source  11.5 %   06/19/2019   How often do you check your blood sugar?  > 4 times/day   Free Style Libre CGM   Fasting Blood glucose range (mg/dL)  130-179;180-200    Postprandial Blood glucose range (mg/dL)  180-200;>200;130-179    Number of hypoglycemic episodes per month  1    Can you tell when your blood sugar is low?  Yes    What do you do if your blood sugar is low?  glucose tablets or candy bar    Number of hyperglycemic episodes per week  1    Can you tell when your blood sugar is high?  Yes    Have you had a dilated eye exam in the past 12 months?  Yes    Have you had a dental exam in the past 12 months?  No    Are you checking  your feet?  Yes    How many days per week are you checking your feet?  4      Dietary Intake   Breakfast  2 packs of peanut butter nabs crackers    Lunch  burger + diet drink   chicken noodle soup + 1/2 pack of crackers   Snack (afternoon)  chips    Dinner  pork chop + mashed potatoes + 1 vegetable + dinner roll   deer meat + rice + vegetable   Snack (evening)  peanut butter sandwich   ice cream, peanut butter crackers, cake, pie   Beverage(s)  diet soda, water      Exercise   Exercise Type  ADL's    How many days per week to you exercise?  0    How many minutes per day do you exercise?  0    Total minutes per week of exercise  0      Patient Education   Previous Diabetes Education  Yes (please comment)   when diagnosed 12-14 years ago   Disease state   Definition of diabetes, type 1 and 2, and the diagnosis of diabetes;Factors that contribute to the development of diabetes;Explored patient's  options for treatment of their diabetes    Nutrition management   Reviewed blood glucose goals for pre and post meals and how to evaluate the patients' food intake on their blood glucose level.;Meal timing in regards to the patients' current diabetes medication.    Physical activity and exercise   Role of exercise on diabetes management, blood pressure control and cardiac health.    Medications  Reviewed patients medication for diabetes, action, purpose, timing of dose and side effects.;Reviewed medication adjustment guidelines for hyperglycemia and sick days.    Monitoring  Taught/discussed recording of test results and interpretation of SMBG.;Identified appropriate SMBG and/or A1C goals.;Yearly dilated eye exam    Acute complications  Discussed and identified patients' treatment of hyperglycemia.;Taught treatment of hypoglycemia - the 15 rule.;Covered sick day management with medication and food.    Psychosocial adjustment  Role of stress on diabetes;Helped patient identify a support system for diabetes management;Identified and addressed patients feelings and concerns about diabetes      Individualized Goals (developed by patient)   Medications  take my medication as prescribed;Other (comment)   ask MD about timing of insulin   Monitoring   send in my blood glucose log as discussed   provide CGM readings to MD   Reducing Risk  examine blood glucose patterns;get labs drawn;treat hypoglycemia with 15 grams of carbs if blood glucose less than 70mg /dL    Health Coping  ask for help with (comment)   insulin dosage and timing with MD     Outcomes   Expected Outcomes  Demonstrated interest in learning. Expect positive outcomes    Future DMSE  4-6 wks    Program Status  Not Completed       Individualized Plan for Diabetes Self-Management Training:   Learning Objective:  Patient will have a greater understanding of diabetes self-management. Patient education plan is to attend individual and/or  group sessions per assessed needs and concerns.   Expected Outcomes:  Demonstrated interest in learning. Expect positive outcomes  Education material provided: ADA - How to Thrive: A Guide for Your Journey with Diabetes, A1C conversion sheet and My Plate, Meal Ideas, Types of Diabetes and Blood Glucose Control  If problems or questions, patient to contact team via:  Phone and Email  Future  DSME appointment: 4-6 wks

## 2019-08-07 ENCOUNTER — Other Ambulatory Visit: Payer: Medicare Other | Admitting: *Deleted

## 2019-08-07 ENCOUNTER — Other Ambulatory Visit: Payer: Self-pay

## 2019-08-07 DIAGNOSIS — E7800 Pure hypercholesterolemia, unspecified: Secondary | ICD-10-CM

## 2019-08-07 DIAGNOSIS — I2510 Atherosclerotic heart disease of native coronary artery without angina pectoris: Secondary | ICD-10-CM | POA: Diagnosis not present

## 2019-08-07 LAB — HEPATIC FUNCTION PANEL
ALT: 23 IU/L (ref 0–44)
AST: 26 IU/L (ref 0–40)
Albumin: 4.5 g/dL (ref 3.8–4.8)
Alkaline Phosphatase: 78 IU/L (ref 39–117)
Bilirubin Total: 0.4 mg/dL (ref 0.0–1.2)
Bilirubin, Direct: 0.14 mg/dL (ref 0.00–0.40)
Total Protein: 6.4 g/dL (ref 6.0–8.5)

## 2019-08-07 LAB — LIPID PANEL
Chol/HDL Ratio: 3.4 ratio (ref 0.0–5.0)
Cholesterol, Total: 121 mg/dL (ref 100–199)
HDL: 36 mg/dL — ABNORMAL LOW (ref >39)
LDL Chol Calc (NIH): 62 mg/dL (ref 0–99)
Triglycerides: 130 mg/dL (ref 0–149)
VLDL Cholesterol Cal: 23 mg/dL (ref 5–40)

## 2019-08-28 DIAGNOSIS — E1165 Type 2 diabetes mellitus with hyperglycemia: Secondary | ICD-10-CM | POA: Diagnosis not present

## 2019-08-29 ENCOUNTER — Ambulatory Visit: Payer: Medicare Other | Admitting: Dietician

## 2019-08-31 DIAGNOSIS — E1165 Type 2 diabetes mellitus with hyperglycemia: Secondary | ICD-10-CM | POA: Diagnosis not present

## 2019-08-31 DIAGNOSIS — I25119 Atherosclerotic heart disease of native coronary artery with unspecified angina pectoris: Secondary | ICD-10-CM | POA: Diagnosis not present

## 2019-08-31 DIAGNOSIS — E785 Hyperlipidemia, unspecified: Secondary | ICD-10-CM | POA: Diagnosis not present

## 2019-08-31 DIAGNOSIS — I10 Essential (primary) hypertension: Secondary | ICD-10-CM | POA: Diagnosis not present

## 2019-08-31 DIAGNOSIS — Z0189 Encounter for other specified special examinations: Secondary | ICD-10-CM | POA: Diagnosis not present

## 2019-09-14 ENCOUNTER — Other Ambulatory Visit: Payer: Self-pay

## 2019-09-14 ENCOUNTER — Encounter: Payer: Medicare Other | Attending: Family Medicine | Admitting: Dietician

## 2019-09-14 DIAGNOSIS — Z794 Long term (current) use of insulin: Secondary | ICD-10-CM | POA: Diagnosis present

## 2019-09-14 DIAGNOSIS — E118 Type 2 diabetes mellitus with unspecified complications: Secondary | ICD-10-CM | POA: Insufficient documentation

## 2019-09-14 NOTE — Patient Instructions (Signed)
Aim for 3-4 carb choices (or 45 - 60 grams of carbohydrates) per meal.   Aim for 0-2 carb choices (or 0 - 30 grams of carbohydrates) per snack.

## 2019-09-14 NOTE — Progress Notes (Signed)
210 avg  267 highest   Diabetes Self-Management Education  Visit Type: Follow-up  09/14/2019  [name], identified by name and date of birth, is a 69 y.o. male with a diagnosis of Diabetes: Type 2.   ASSESSMENT  Per patient, average BG is 210 with highest recent BG 267. Patient uses the [facility] to monitor his BG. States his readings have been looking a little better. States he is working with another group to help manage his diabetes and he is learning about measuring foods and reducing portion sizes. Patient asked about artificial sweeteners, so we discussed these. We also discussed carb counting- how to do this and why it is important to spread carbohydrate intake throughout the day.    Diabetes Self-Management Education - 09/14/19 1023      Visit Information   Visit Type Follow-up      Initial Visit   Diabetes Type Type 2      Complications   How often do you check your blood sugar? > 4 times/day    Fasting Blood glucose range (mg/dL) 272-536;644-034;>742    Postprandial Blood glucose range (mg/dL) 595-638;>756    Number of hypoglycemic episodes per month 0      Patient Education   Nutrition management  Carbohydrate counting      Individualized Goals (developed by patient)   Nutrition Follow meal plan discussed   3-4 carb choices per meal; 0-2 carb choices per snack     Outcomes   Expected Outcomes Demonstrated interest in learning. Expect positive outcomes    Future DMSE PRN      Subsequent Visit   Since your last visit have you continued or begun to take your medications as prescribed? Yes    Since your last visit have you had your blood pressure checked? Yes    Is your most recent blood pressure lower, unchanged, or higher since your last visit? Unchanged    Since your last visit, are you checking your blood glucose at least once a day? Yes           Individualized Plan for Diabetes Self-Management Training:   Learning Objective:  Patient will  have a greater understanding of diabetes self-management. Patient education plan is to attend individual and/or group sessions per assessed needs and concerns.   Plan:  Patient Instructions  Aim for 3-4 carb choices (or 45 - 60 grams of carbohydrates) per meal.   Aim for 0-2 carb choices (or 0 - 30 grams of carbohydrates) per snack.    Expected Outcomes:  Demonstrated interest in learning. Expect positive outcomes  Education material provided: Snack sheet, Types of Sugar  If problems or questions, patient to contact team via:  Phone and Email  Future DSME appointment: PRN

## 2019-09-15 ENCOUNTER — Other Ambulatory Visit: Payer: Self-pay | Admitting: Physician Assistant

## 2019-09-18 NOTE — Telephone Encounter (Signed)
Called patient to discuss Crestor refill, refill request is for 20 mg daily and last OV says Crestor 10 mg daily. Patient stated he has some questions about Crestor for Bing Neighbors anyway and would like to hold off on refill until after his appointment on 09/27/2019. Patient did state he is taking 20 mg daily of Crestor. Patient stated he has more than enough pills at the moment. I did refill nitro for pt.

## 2019-09-26 NOTE — Progress Notes (Signed)
Cardiology Office Note:    Date:  09/27/2019   ID:  [name], DOB [birth year], MRN [id number]  PCP:  Celene Squibb, MD  Cardiologist:  Sherren Mocha, MD   Electrophysiologist:  None   Referring MD: Sinda Du, MD   Chief Complaint:  Chest Pain    Patient Profile:    [name] is a 69 y.o. male with:   Coronary artery disease  ? S/p CABG in 2009  Hypertension   Hyperlipidemia   Diabetes mellitus   Hx of diabetic foot ulcer  PAD  ? eval by Dr. Fletcher Anon in 2019 >> small vessel dz >> med Rx   S/p cryptogenic CVA 01/2019 ? 2 vascular territories (R temporoparietal and occipital) - ?cardioembolic  ? Event monitor done by PCP  Prior CV studies: Event monitor 02/2019 Sinus rhythm, HR 48-99 No atrial fibrillation  Echocardiogram 02/01/2019 EF 60-65, mod LVH, mildly reduced RVSF, trace MR, mild TR  Carotid US 01/31/2019 IMPRESSION: Color duplex indicates minimal heterogeneous and calcified plaque, with no hemodynamically significant stenosis by duplex criteria in the extracranial cerebrovascular circulation.  Echocardiogram 10/29/2017 Mild LVH, EF 61-44, normal diastolic function, trivial AI, moderate LAE  Nuclear stress test 10/29/2017 EF 50, fixed septal/anteroseptal, lateral/inferolateral defect suggesting artifact, no ischemia, low risk  ETT-Myoview (03/14/13): Ex 9:00, ECG with 1 mm ST depression inferiorly, primarily reversible medium-sized anteroseptal defect, EF 54% (intermediate risk).  Carotid US (04/2012): Mild plaque bilaterally (0-39%) =>repeat in 1 year.  History of Present Illness:    [name] was last seen in Dec 2020.  He had recently been admitted for cryptogenic stroke.  He had an event monitor arranged by primary care that did not show evidence of atrial fibrillation.  He returns for follow up.  He is here alone.  Over the past 3 mos, he has been having episodes of chest discomfort with radiation to his arms.  This  happens at rest and with exertion.  It reminds him of his previous angina prior to PCI in the past.  He has noted decreased exercise tolerance and worsening exertional dyspnea.  He has dependent edema.  He has not had orthopnea, syncope.  He uses CPAP at night.     Past Medical History:  Diagnosis Date  . Allergy   . CAD (coronary artery disease), native coronary artery    Multiple PCI procedures followed by CABG in 2009 with LIMA-LAD, SVG-D, seq SVG-OM and PLV, SVG-PDA. Echo (3/12): EF 60-65%, mild MR. // Nuc Stress Test 7/19:  EF 50, artifact, no ischemia, low risk   . Carotid artery disease ([place]) 01/31/2017   dopplers 1/14:  0-39% bilat  . Carotid stenosis    dopplers 1/14:  0-39% bilat  . Cataract    REMOVED  . Colon polyps 2006   Colonoscopy-Dr. Patterson(Tubular Adenoma)   . Coronary Artery Disease 06/03/2008   Annotation: S/P CABG 2009 Qualifier: Diagnosis of  By: Olevia Perches, MD, Glenetta Hew   . DM2 (diabetes mellitus, type 2) ([place])    Followed by Dr. Carlis Abbott  . Essential hypertension 06/03/2008   Qualifier: Diagnosis of  By: Olevia Perches, MD, Glenetta Hew   . History of echocardiogram    Echo 7/19: Mild LVH, EF 31-54, normal diastolic function, trivial AI, moderate LAE  . HLD (hyperlipidemia)   . HTN (hypertension)   . Hx of cardiovascular stress test    ETT-Myoview (03/2013):  Anteroseptal reversible defect, EF 54%, intermediate risk.  [name] Hyperlipidemia 06/03/2008   Qualifier: Diagnosis  of  By: Olevia Perches, MD, Glenetta Hew   . Ischemic stroke ([place]) 01/30/2019  . OSA (obstructive sleep apnea) 08/25/2011  . PAD (peripheral artery disease) ([place]) 12/10/2017  . Postoperative atrial fibrillation ([place]) 2009  . Renal insufficiency 01/31/2019  . Stroke ([place])   . Type 2 diabetes mellitus with complication, with long-term current use of insulin ([place]) 06/03/2008   Qualifier: Diagnosis of  By: Olevia Perches, MD, Glenetta Hew   . Ulcer    left foot  . Varicose veins of lower extremities  with other complications 12/12/9209  1. CAD: Multiple PCI procedures followed by CABG in 2009 with LIMA-LAD, SVG-D, seq SVG-OM and PLV, SVG-PDA.Echo (3/12): EF 60-65%, mild [name]-Cardiolite in 12/14 with a reversible basal to mid anteroseptal defect with EF 54%.  2. Insulin-dependent diabetes: Followed by Dr. Carlis Abbott.Has diabetic foot ulcer.  3. Hypertension.  4. Hyperlipidemia.  5. Postoperative atrial fibrillation after CABG in 2009  6. Carotid dopplers (1/14) with 0-39% bilateral ICA stenosis.  Current Medications: Current Meds  Medication Sig  . amitriptyline (ELAVIL) 75 MG tablet Take 75 mg by mouth at bedtime.  [name] amLODipine (NORVASC) 10 MG tablet Take 1 tablet (10 mg total) by mouth daily.  [name] aspirin EC 325 MG EC tablet Take 1 tablet (325 mg total) by mouth daily.  . clopidogrel (PLAVIX) 75 MG tablet Take 1 tablet (75 mg total) by mouth daily.  [name] ezetimibe (ZETIA) 10 MG tablet Take 1 tablet (10 mg total) by mouth daily.  . fenofibrate 54 MG tablet TAKE 1 TABLET BY MOUTH DAILY  . furosemide (LASIX) 40 MG tablet Take 1 tablet (40 mg total) by mouth daily.  [name] glucose monitoring kit (FREESTYLE) monitoring kit 1 each by Does not apply route as needed for other. Freestyle Libre  . metoprolol tartrate (LOPRESSOR) 100 MG tablet Take 1 tablet (100 mg total) by mouth 2 (two) times daily.  . nitroGLYCERIN (NITROSTAT) 0.4 MG SL tablet PLACE 1 TABLET UNDER THE TONGUE EVERY 5 MINUTES AS NEEDED FOR CHEST PAIN  . NOVOLOG MIX 70/30 FLEXPEN (70-30) 100 UNIT/ML Pen Inject 40-60 Units into the skin 2 (two) times daily. Per sliding scale  . rosuvastatin (CRESTOR) 20 MG tablet Take 1 tablet (20 mg total) by mouth daily.     Allergies:   Penicillins   Social History   Tobacco Use  . Smoking status: Never Smoker  . Smokeless tobacco: Former Systems developer    Types: Secondary school teacher  . Vaping Use: Never used  Substance Use Topics  . Alcohol use: No  . Drug use: No     Family Hx: The patient's  family history includes Cancer in his mother; Colon cancer in his mother; Coronary artery disease in an other family member; Diabetes in his brother, father, and mother; Heart disease in his father, mother, and another family member; Hyperlipidemia in his father and mother; Hypertension in his father; Other in his brother and brother; Prostate cancer in his brother and father.  Review of Systems  Constitutional: Negative for fever.  Respiratory: Negative for cough.   Musculoskeletal: Positive for joint pain.  Gastrointestinal: Negative for hematochezia and melena.  Genitourinary: Negative for hematuria.     EKGs/Labs/Other Test Reviewed:    EKG:  EKG is   ordered today.  The ekg ordered today demonstrates normal sinus rhythm, HR 61, LAD, non-specific ST-TW changes, QTc 430, no change  Recent Labs: 08/07/2019: ALT 23 09/27/2019: BUN 25; Creatinine, Ser 1.29; Hemoglobin 14.9; Platelets 218; Potassium 4.5; Sodium  139   Recent Lipid Panel Lab Results  Component Value Date/Time   CHOL 121 08/07/2019 08:15 AM   TRIG 130 08/07/2019 08:15 AM   HDL 36 (L) 08/07/2019 08:15 AM   CHOLHDL 3.4 08/07/2019 08:15 AM   CHOLHDL 4.2 01/31/2019 04:44 AM   LDLCALC 62 08/07/2019 08:15 AM   LDLDIRECT 78.0 11/26/2014 10:10 AM    Physical Exam:    VS:  BP 130/60   Pulse 61   Ht _0  (1.727 m)   Wt 252 lb (114.3 kg)   SpO2 96%   BMI 38.32 kg/m     Wt Readings from Last 3 Encounters:  09/27/19 252 lb (114.3 kg)  07/26/19 254 lb (115.2 kg)  04/04/19 242 lb 12.8 oz (110.1 kg)     Constitutional:      Appearance: Healthy appearance. Not in distress.  Neck:     Thyroid: No thyromegaly.     Vascular: JVD normal.     Lymphadenopathy: No cervical adenopathy.  Lymphadenopathy:      Head:     Left side of head: Tonsillar adenopathy:    Pulmonary:     Effort: Pulmonary effort is normal.     Breath sounds: No wheezing. No rales.  Cardiovascular:     Normal rate. Regular rhythm. Normal S1. Normal  S2.     Murmurs: There is no murmur.  Edema:    Pretibial: bilateral trace edema of the pretibial area. Abdominal:     Palpations: Abdomen is soft. There is no hepatomegaly.  Skin:    General: Skin is warm and dry.  Neurological:     General: No focal deficit present.     Mental Status: Alert and oriented to person, place and time.     Cranial Nerves: Cranial nerves are intact.  Psychiatric:        Mood and Affect: Affect normal.       ASSESSMENT & PLAN:    1. Coronary artery disease involving native coronary artery of native heart with angina pectoris ([place]) History of CABG in 2009.  Nuclear stress test in 2019 was low risk.  Over the past 3 mos, he has developed decreased exercise tolerance, dyspnea on exertion and episodes of chest discomfort similar to his prior angina.  He seems to be describing progressive angina.  I have recommended proceeding with Cardiac Catheterization to evaluate his symptoms.  I d/w Dr. Burt Knack who agreed.  Risks and benefits of cardiac catheterization have been discussed with the patient.  These include bleeding, infection, kidney damage, stroke, heart attack, death.  The patient understands these risks and is willing to proceed.  -Continue current dose of ASA, Plavix, statin, beta-blocker   -Start Isosorbide 30 mg once daily (he does not take PDE-5 inhibitors)  -Plan cath with Dr. Burt Knack in the next week  -He knows to go to the ED if symptoms worsen.   2. History of stroke Monitor was neg for atrial fibrillation.  Notes reviewed again.  There was concern for cardioembolic source.  I think it would be good for him to see EP to see if ILR is indicated.  I will make the referral once he returns post cath.    3. Essential hypertension The patient's blood pressure is controlled on his current regimen.  Continue current therapy.    4. Pure hypercholesterolemia Lipids optimal.  However, he is having side effects to Rosuvastatin with arthralgias.  Refer to Lipid  Clinic for possible PCSK9 inhibitor Rx.    Dispo:  Return in about 2 weeks (around 10/11/2019) for Post Procedure Follow Up, w/ [name], PA-C, in person.   Medication Adjustments/Labs and Tests Ordered: Current medicines are reviewed at length with the patient today.  Concerns regarding medicines are outlined above.  Tests Ordered: Orders Placed This Encounter  Procedures  . Basic metabolic panel  . CBC  . AMB Referral to [facility]  . EKG 12-Lead   Medication Changes: Meds ordered this encounter  Medications  . isosorbide mononitrate (IMDUR) 30 MG 24 hr tablet    Sig: Take 1 tablet (30 mg total) by mouth daily.    Dispense:  90 tablet    Refill:  3    Signed, [name], PA-C  09/27/2019 5:29 PM    [facility] [place], [place], [place]  [address] Phone: [phone]; Fax: [phone]

## 2019-09-26 NOTE — H&P (View-Only) (Signed)
Cardiology Office Note:    Date:  09/27/2019   ID:  [name], DOB 1951/03/02, MRN [id number]  PCP:  Celene Squibb, MD  Cardiologist:  Sherren Mocha, MD   Electrophysiologist:  None   Referring MD: Sinda Du, MD   Chief Complaint:  Chest Pain    Patient Profile:    [name] is a 70 y.o. male with:   Coronary artery disease  ? S/p CABG in 2009  Hypertension   Hyperlipidemia   Diabetes mellitus   Hx of diabetic foot ulcer  PAD  ? eval by Dr. Fletcher Anon in 2019 >> small vessel dz >> med Rx   S/p cryptogenic CVA 01/2019 ? 2 vascular territories (R temporoparietal and occipital) - ?cardioembolic  ? Event monitor done by PCP  Prior CV studies: Event monitor 02/2019 Sinus rhythm, HR 48-99 No atrial fibrillation  Echocardiogram 02/01/2019 EF 60-65, mod LVH, mildly reduced RVSF, trace MR, mild TR  Carotid US 01/31/2019 IMPRESSION: Color duplex indicates minimal heterogeneous and calcified plaque, with no hemodynamically significant stenosis by duplex criteria in the extracranial cerebrovascular circulation.  Echocardiogram 10/29/2017 Mild LVH, EF 91-63, normal diastolic function, trivial AI, moderate LAE  Nuclear stress test 10/29/2017 EF 50, fixed septal/anteroseptal, lateral/inferolateral defect suggesting artifact, no ischemia, low risk  ETT-Myoview (03/14/13): Ex 9:00, ECG with 1 mm ST depression inferiorly, primarily reversible medium-sized anteroseptal defect, EF 54% (intermediate risk).  Carotid US (04/2012): Mild plaque bilaterally (0-39%) =>repeat in 1 year.  History of Present Illness:    Mr. Ahart was last seen in Dec 2020.  He had recently been admitted for cryptogenic stroke.  He had an event monitor arranged by primary care that did not show evidence of atrial fibrillation.  He returns for follow up.  He is here alone.  Over the past 3 mos, he has been having episodes of chest discomfort with radiation to his arms.  This  happens at rest and with exertion.  It reminds him of his previous angina prior to PCI in the past.  He has noted decreased exercise tolerance and worsening exertional dyspnea.  He has dependent edema.  He has not had orthopnea, syncope.  He uses CPAP at night.     Past Medical History:  Diagnosis Date   Allergy    CAD (coronary artery disease), native coronary artery    Multiple PCI procedures followed by CABG in 2009 with LIMA-LAD, SVG-D, seq SVG-OM and PLV, SVG-PDA. Echo (3/12): EF 60-65%, mild MR. // Nuc Stress Test 7/19:  EF 50, artifact, no ischemia, low risk    Carotid artery disease ([place]) 01/31/2017   dopplers 1/14:  0-39% bilat   Carotid stenosis    dopplers 1/14:  0-39% bilat   Cataract    REMOVED   Colon polyps 2006   Colonoscopy-Dr. Patterson(Tubular Adenoma)    Coronary Artery Disease 06/03/2008   Annotation: S/P CABG 2009 Qualifier: Diagnosis of  By: Olevia Perches, MD, Glenetta Hew    DM2 (diabetes mellitus, type 2) ([place])    Followed by Dr. Carlis Abbott   Essential hypertension 06/03/2008   Qualifier: Diagnosis of  By: Olevia Perches, MD, Glenetta Hew    History of echocardiogram    Echo 7/19: Mild LVH, EF 84-66, normal diastolic function, trivial AI, moderate LAE   HLD (hyperlipidemia)    HTN (hypertension)    Hx of cardiovascular stress test    ETT-Myoview (03/2013):  Anteroseptal reversible defect, EF 54%, intermediate risk.   Hyperlipidemia 06/03/2008   Qualifier: Diagnosis  of  By: Olevia Perches, MD, Glenetta Hew    Ischemic stroke [facility]) 01/30/2019   OSA (obstructive sleep apnea) 08/25/2011   PAD (peripheral artery disease) ([place]) 12/10/2017   Postoperative atrial fibrillation ([place]) 2009   Renal insufficiency 01/31/2019   Stroke [facility])    Type 2 diabetes mellitus with complication, with long-term current use of insulin ([place]) 06/03/2008   Qualifier: Diagnosis of  By: Olevia Perches, MD, Glenetta Hew    Ulcer    left foot   Varicose veins of lower extremities  with other complications 7/0/7867  1. CAD: Multiple PCI procedures followed by CABG in 2009 with LIMA-LAD, SVG-D, seq SVG-OM and PLV, SVG-PDA.Echo (3/12): EF 60-65%, mild MR.ETT-Cardiolite in 12/14 with a reversible basal to mid anteroseptal defect with EF 54%.  2. Insulin-dependent diabetes: Followed by Dr. Carlis Abbott.Has diabetic foot ulcer.  3. Hypertension.  4. Hyperlipidemia.  5. Postoperative atrial fibrillation after CABG in 2009  6. Carotid dopplers (1/14) with 0-39% bilateral ICA stenosis.  Current Medications: Current Meds  Medication Sig   amitriptyline (ELAVIL) 75 MG tablet Take 75 mg by mouth at bedtime.   amLODipine (NORVASC) 10 MG tablet Take 1 tablet (10 mg total) by mouth daily.   aspirin EC 325 MG EC tablet Take 1 tablet (325 mg total) by mouth daily.   clopidogrel (PLAVIX) 75 MG tablet Take 1 tablet (75 mg total) by mouth daily.   ezetimibe (ZETIA) 10 MG tablet Take 1 tablet (10 mg total) by mouth daily.   fenofibrate 54 MG tablet TAKE 1 TABLET BY MOUTH DAILY   furosemide (LASIX) 40 MG tablet Take 1 tablet (40 mg total) by mouth daily.   glucose monitoring kit (FREESTYLE) monitoring kit 1 each by Does not apply route as needed for other. Freestyle Libre   metoprolol tartrate (LOPRESSOR) 100 MG tablet Take 1 tablet (100 mg total) by mouth 2 (two) times daily.   nitroGLYCERIN (NITROSTAT) 0.4 MG SL tablet PLACE 1 TABLET UNDER THE TONGUE EVERY 5 MINUTES AS NEEDED FOR CHEST PAIN   NOVOLOG MIX 70/30 FLEXPEN (70-30) 100 UNIT/ML Pen Inject 40-60 Units into the skin 2 (two) times daily. Per sliding scale   rosuvastatin (CRESTOR) 20 MG tablet Take 1 tablet (20 mg total) by mouth daily.     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  Review of Systems  Constitutional: Negative for fever.  Respiratory: Negative for cough.   Musculoskeletal: Positive for joint pain.  Gastrointestinal: Negative for hematochezia and melena.  Genitourinary: Negative for hematuria.     EKGs/Labs/Other Test Reviewed:    EKG:  EKG is   ordered today.  The ekg ordered today demonstrates normal sinus rhythm, HR 61, LAD, non-specific ST-TW changes, QTc 430, no change  Recent Labs: 08/07/2019: ALT 23 09/27/2019: BUN 25; Creatinine, Ser 1.29; Hemoglobin 14.9; Platelets 218; Potassium 4.5; Sodium  139   Recent Lipid Panel Lab Results  Component Value Date/Time   CHOL 121 08/07/2019 08:15 AM   TRIG 130 08/07/2019 08:15 AM   HDL 36 (L) 08/07/2019 08:15 AM   CHOLHDL 3.4 08/07/2019 08:15 AM   CHOLHDL 4.2 01/31/2019 04:44 AM   LDLCALC 62 08/07/2019 08:15 AM   LDLDIRECT 78.0 11/26/2014 10:10 AM    Physical Exam:    VS:  BP 130/60    Pulse 61    Ht _0  (1.727 m)    Wt 252 lb (114.3 kg)    SpO2 96%    BMI 38.32 kg/m     Wt Readings from Last 3 Encounters:  09/27/19 252 lb (114.3 kg)  07/26/19 254 lb (115.2 kg)  04/04/19 242 lb 12.8 oz (110.1 kg)     Constitutional:      Appearance: Healthy appearance. Not in distress.  Neck:     Thyroid: No thyromegaly.     Vascular: JVD normal.     Lymphadenopathy: No cervical adenopathy.  Lymphadenopathy:      Head:     Left side of head: Tonsillar adenopathy:    Pulmonary:     Effort: Pulmonary effort is normal.     Breath sounds: No wheezing. No rales.  Cardiovascular:     Normal rate. Regular rhythm. Normal S1. Normal  S2.     Murmurs: There is no murmur.  Edema:    Pretibial: bilateral trace edema of the pretibial area. Abdominal:     Palpations: Abdomen is soft. There is no hepatomegaly.  Skin:    General: Skin is warm and dry.  Neurological:     General: No focal deficit present.     Mental Status: Alert and oriented to person, place and time.     Cranial Nerves: Cranial nerves are intact.  Psychiatric:        Mood and Affect: Affect normal.       ASSESSMENT & PLAN:    1. Coronary artery disease involving native coronary artery of native heart with angina pectoris ([place]) History of CABG in 2009.  Nuclear stress test in 2019 was low risk.  Over the past 3 mos, he has developed decreased exercise tolerance, dyspnea on exertion and episodes of chest discomfort similar to his prior angina.  He seems to be describing progressive angina.  I have recommended proceeding with Cardiac Catheterization to evaluate his symptoms.  I d/w Dr. Burt Knack who agreed.  Risks and benefits of cardiac catheterization have been discussed with the patient.  These include bleeding, infection, kidney damage, stroke, heart attack, death.  The patient understands these risks and is willing to proceed.  -Continue current dose of ASA, Plavix, statin, beta-blocker   -Start Isosorbide 30 mg once daily (he does not take PDE-5 inhibitors)  -Plan cath with Dr. Burt Knack in the next week  -He knows to go to the ED if symptoms worsen.   2. History of stroke Monitor was neg for atrial fibrillation.  Notes reviewed again.  There was concern for cardioembolic source.  I think it would be good for him to see EP to see if ILR is indicated.  I will make the referral once he returns post cath.    3. Essential hypertension The patient's blood pressure is controlled on his current regimen.  Continue current therapy.    4. Pure hypercholesterolemia Lipids optimal.  However, he is having side effects to Rosuvastatin with arthralgias.  Refer to Lipid  Clinic for possible PCSK9 inhibitor  Rx.    Dispo:  Return in about 2 weeks (around 10/11/2019) for Post Procedure Follow Up, w/ [name], PA-C, in person.   Medication Adjustments/Labs and Tests Ordered: Current medicines are reviewed at length with the patient today.  Concerns regarding medicines are outlined above.  Tests Ordered: Orders Placed This Encounter  Procedures   Basic metabolic panel   CBC   AMB Referral to [facility]   EKG 12-Lead   Medication Changes: Meds ordered this encounter  Medications   isosorbide mononitrate (IMDUR) 30 MG 24 hr tablet    Sig: Take 1 tablet (30 mg total) by mouth daily.    Dispense:  90 tablet    Refill:  3    Signed, [name], PA-C  09/27/2019 5:29 PM    [facility] [place], [place], [place]  [address] Phone: [phone]; Fax: [phone]

## 2019-09-27 ENCOUNTER — Other Ambulatory Visit: Payer: Self-pay

## 2019-09-27 ENCOUNTER — Encounter: Payer: Self-pay | Admitting: Physician Assistant

## 2019-09-27 ENCOUNTER — Ambulatory Visit: Payer: Medicare Other | Admitting: Physician Assistant

## 2019-09-27 ENCOUNTER — Ambulatory Visit (INDEPENDENT_AMBULATORY_CARE_PROVIDER_SITE_OTHER): Payer: Medicare Other | Admitting: Physician Assistant

## 2019-09-27 VITALS — BP 130/60 | HR 61 | Ht 68.0 in | Wt 252.0 lb

## 2019-09-27 DIAGNOSIS — Z8673 Personal history of transient ischemic attack (TIA), and cerebral infarction without residual deficits: Secondary | ICD-10-CM

## 2019-09-27 DIAGNOSIS — I25119 Atherosclerotic heart disease of native coronary artery with unspecified angina pectoris: Secondary | ICD-10-CM | POA: Diagnosis not present

## 2019-09-27 DIAGNOSIS — I10 Essential (primary) hypertension: Secondary | ICD-10-CM

## 2019-09-27 DIAGNOSIS — E7800 Pure hypercholesterolemia, unspecified: Secondary | ICD-10-CM | POA: Diagnosis not present

## 2019-09-27 LAB — BASIC METABOLIC PANEL WITH GFR
BUN/Creatinine Ratio: 19 (ref 10–24)
BUN: 25 mg/dL (ref 8–27)
CO2: 26 mmol/L (ref 20–29)
Calcium: 10.1 mg/dL (ref 8.6–10.2)
Chloride: 103 mmol/L (ref 96–106)
Creatinine, Ser: 1.29 mg/dL — ABNORMAL HIGH (ref 0.76–1.27)
GFR calc Af Amer: 66 mL/min/1.73
GFR calc non Af Amer: 57 mL/min/1.73 — ABNORMAL LOW
Glucose: 281 mg/dL — ABNORMAL HIGH (ref 65–99)
Potassium: 4.5 mmol/L (ref 3.5–5.2)
Sodium: 139 mmol/L (ref 134–144)

## 2019-09-27 LAB — CBC
Hematocrit: 45.2 % (ref 37.5–51.0)
Hemoglobin: 14.9 g/dL (ref 13.0–17.7)
MCH: 28.5 pg (ref 26.6–33.0)
MCHC: 33 g/dL (ref 31.5–35.7)
MCV: 86 fL (ref 79–97)
Platelets: 218 x10E3/uL (ref 150–450)
RBC: 5.23 x10E6/uL (ref 4.14–5.80)
RDW: 15.3 % (ref 11.6–15.4)
WBC: 7.2 x10E3/uL (ref 3.4–10.8)

## 2019-09-27 MED ORDER — ISOSORBIDE MONONITRATE ER 30 MG PO TB24
30.0000 mg | ORAL_TABLET | Freq: Every day | ORAL | 3 refills | Status: DC
Start: 2019-09-27 — End: 2019-11-28

## 2019-09-27 NOTE — Patient Instructions (Addendum)
Medication Instructions:   Your physician has recommended you make the following change in your medication:   1) Start Imdur 30 mg, 1 tablet by mouth once a day  *If you need a refill on your cardiac medications before your next appointment, please call your pharmacy*  Lab Work:  You will have labs drawn today: BMET/CBC  If you have labs (blood work) drawn today and your tests are completely normal, you will receive your results only by: [name] MyChart Message (if you have MyChart) OR . A paper copy in the mail If you have any lab test that is abnormal or we need to change your treatment, we will call you to review the results.  Testing/Procedures:  Your physician has requested that you have a cardiac catheterization. Cardiac catheterization is used to diagnose and/or treat various heart conditions. Doctors may recommend this procedure for a number of different reasons. The most common reason is to evaluate chest pain. Chest pain can be a symptom of coronary artery disease (CAD), and cardiac catheterization can show whether plaque is narrowing or blocking your heart's arteries. This procedure is also used to evaluate the valves, as well as measure the blood flow and oxygen levels in different parts of your heart. For further information please visit [email or web address] Please follow instruction sheet, as given.  Follow-Up:  On 10/18/19 at 11:15AM with Tereso Newcomer, PA-C

## 2019-09-28 ENCOUNTER — Telehealth: Payer: Self-pay | Admitting: Physician Assistant

## 2019-09-28 ENCOUNTER — Ambulatory Visit: Payer: Medicare Other | Admitting: Family Medicine

## 2019-09-28 NOTE — Telephone Encounter (Signed)
New message:    Wife called and wanted to know if pt was supposed to have had lab work yesterday when he was here? If so, pt did not have it yesterday. When can he come in to get it, if he needs it please?

## 2019-09-28 NOTE — Telephone Encounter (Signed)
Returned call to wife.  Advised Pt got lab work.  Wife requesting information regarding her oop costs for procedure.  Gave billing code and advised to call medicare supplement provider.

## 2019-10-03 ENCOUNTER — Telehealth: Payer: Self-pay

## 2019-10-03 NOTE — Telephone Encounter (Addendum)
LMTCB to go over pt Heart Cath instructions for 10/04/19.    Noted: Covid vaccine with [facility] 06/15/19 and 07/18/19.    Pre-catheterization scheduled at [facility] for: 10/04/19. Verified arrival time and place: [facility] Main Entrance [facility]) at: 12 noon.    No solid food after midnight prior to cath, clear liquids until 5 AM day of procedure.   AM meds can be  taken pre-cath with sips of water including: ASA 81 mg Plavix 75 mg   Hold Lasix and 1/2 his insulin dose (sliding scale)    Confirmed patient has responsible adult to drive home post procedure and observe 24 hours after arriving home:   You are allowed ONE visitor in the waiting room during your procedure. Both you and your visitor must wear masks.

## 2019-10-04 ENCOUNTER — Encounter (HOSPITAL_COMMUNITY)
Admission: RE | Disposition: A | Discharge status: Home / Self Care | Payer: Self-pay | Source: Home / Self Care | Attending: Cardiovascular Disease

## 2019-10-04 ENCOUNTER — Other Ambulatory Visit: Payer: Self-pay

## 2019-10-04 ENCOUNTER — Ambulatory Visit: Payer: Medicare Other | Admitting: Family Medicine

## 2019-10-04 ENCOUNTER — Ambulatory Visit (HOSPITAL_COMMUNITY)
Admission: RE | Admit: 2019-10-04 | Discharge: 2019-10-04 | Disposition: A | Discharge status: Home / Self Care | Payer: Medicare Other | Attending: Cardiovascular Disease | Admitting: Cardiovascular Disease

## 2019-10-04 DIAGNOSIS — Z79899 Other long term (current) drug therapy: Secondary | ICD-10-CM | POA: Insufficient documentation

## 2019-10-04 DIAGNOSIS — E785 Hyperlipidemia, unspecified: Secondary | ICD-10-CM | POA: Diagnosis not present

## 2019-10-04 DIAGNOSIS — E7800 Pure hypercholesterolemia, unspecified: Secondary | ICD-10-CM | POA: Insufficient documentation

## 2019-10-04 DIAGNOSIS — Z7902 Long term (current) use of antithrombotics/antiplatelets: Secondary | ICD-10-CM | POA: Insufficient documentation

## 2019-10-04 DIAGNOSIS — E1151 Type 2 diabetes mellitus with diabetic peripheral angiopathy without gangrene: Secondary | ICD-10-CM | POA: Diagnosis not present

## 2019-10-04 DIAGNOSIS — I6523 Occlusion and stenosis of bilateral carotid arteries: Secondary | ICD-10-CM | POA: Insufficient documentation

## 2019-10-04 DIAGNOSIS — Z794 Long term (current) use of insulin: Secondary | ICD-10-CM | POA: Diagnosis not present

## 2019-10-04 DIAGNOSIS — Z8673 Personal history of transient ischemic attack (TIA), and cerebral infarction without residual deficits: Secondary | ICD-10-CM | POA: Diagnosis not present

## 2019-10-04 DIAGNOSIS — G4733 Obstructive sleep apnea (adult) (pediatric): Secondary | ICD-10-CM | POA: Diagnosis not present

## 2019-10-04 DIAGNOSIS — I25119 Atherosclerotic heart disease of native coronary artery with unspecified angina pectoris: Secondary | ICD-10-CM | POA: Diagnosis not present

## 2019-10-04 DIAGNOSIS — Z951 Presence of aortocoronary bypass graft: Secondary | ICD-10-CM | POA: Insufficient documentation

## 2019-10-04 DIAGNOSIS — Z7982 Long term (current) use of aspirin: Secondary | ICD-10-CM | POA: Diagnosis not present

## 2019-10-04 DIAGNOSIS — I10 Essential (primary) hypertension: Secondary | ICD-10-CM | POA: Diagnosis not present

## 2019-10-04 HISTORY — PX: LEFT HEART CATH AND CORS/GRAFTS ANGIOGRAPHY: CATH118250

## 2019-10-04 LAB — GLUCOSE, CAPILLARY: Glucose-Capillary: 229 mg/dL — ABNORMAL HIGH (ref 70–99)

## 2019-10-04 SURGERY — LEFT HEART CATH AND CORS/GRAFTS ANGIOGRAPHY
Anesthesia: LOCAL

## 2019-10-04 MED ORDER — IOHEXOL 350 MG/ML SOLN
INTRAVENOUS | Status: DC | PRN
  Administered 2019-10-04: 90 mL

## 2019-10-04 MED ORDER — ONDANSETRON HCL 4 MG/2ML IJ SOLN: 4.0000 mg | Freq: Four times a day (QID) | INTRAMUSCULAR | Status: DC | PRN

## 2019-10-04 MED ORDER — LIDOCAINE HCL (PF) 1 % IJ SOLN
INTRAMUSCULAR | Status: DC | PRN
  Administered 2019-10-04: 2 mL

## 2019-10-04 MED ORDER — VERAPAMIL HCL 2.5 MG/ML IV SOLN
INTRAVENOUS | Status: AC
  Filled 2019-10-04: qty 2

## 2019-10-04 MED ORDER — VERAPAMIL HCL 2.5 MG/ML IV SOLN
INTRAVENOUS | Status: DC | PRN
  Administered 2019-10-04: 10 mL via INTRA_ARTERIAL

## 2019-10-04 MED ORDER — SODIUM CHLORIDE 0.9% FLUSH: 3.0000 mL | INTRAVENOUS | Status: DC | PRN

## 2019-10-04 MED ORDER — HEPARIN SODIUM (PORCINE) 1000 UNIT/ML IJ SOLN
INTRAMUSCULAR | Status: DC | PRN
  Administered 2019-10-04: 5000 [IU] via INTRAVENOUS

## 2019-10-04 MED ORDER — MIDAZOLAM HCL 2 MG/2ML IJ SOLN
INTRAMUSCULAR | Status: AC
  Filled 2019-10-04: qty 2

## 2019-10-04 MED ORDER — SODIUM CHLORIDE 0.9 % WEIGHT BASED INFUSION
1.0000 mL/kg/h | INTRAVENOUS | Status: DC
  Administered 2019-10-04: 999 mL/h via INTRAVENOUS

## 2019-10-04 MED ORDER — SODIUM CHLORIDE 0.9 % WEIGHT BASED INFUSION
3.0000 mL/kg/h | INTRAVENOUS | Status: AC
  Administered 2019-10-04: 3 mL/kg/h via INTRAVENOUS

## 2019-10-04 MED ORDER — SODIUM CHLORIDE 0.9 % IV SOLN: 250.0000 mL | INTRAVENOUS | Status: DC | PRN

## 2019-10-04 MED ORDER — HEPARIN (PORCINE) IN NACL 1000-0.9 UT/500ML-% IV SOLN
INTRAVENOUS | Status: AC
  Filled 2019-10-04: qty 1000

## 2019-10-04 MED ORDER — SODIUM CHLORIDE 0.9% FLUSH: 3.0000 mL | Freq: Two times a day (BID) | INTRAVENOUS | Status: DC

## 2019-10-04 MED ORDER — MIDAZOLAM HCL 2 MG/2ML IJ SOLN
INTRAMUSCULAR | Status: DC | PRN
  Administered 2019-10-04: 1 mg via INTRAVENOUS

## 2019-10-04 MED ORDER — SODIUM CHLORIDE 0.9 % IV SOLN: INTRAVENOUS | Status: DC

## 2019-10-04 MED ORDER — HEPARIN (PORCINE) IN NACL 1000-0.9 UT/500ML-% IV SOLN
INTRAVENOUS | Status: DC | PRN
  Administered 2019-10-04 (×2): 500 mL

## 2019-10-04 MED ORDER — HEPARIN SODIUM (PORCINE) 1000 UNIT/ML IJ SOLN
INTRAMUSCULAR | Status: AC
  Filled 2019-10-04: qty 1

## 2019-10-04 MED ORDER — LIDOCAINE HCL (PF) 1 % IJ SOLN
INTRAMUSCULAR | Status: AC
  Filled 2019-10-04: qty 30

## 2019-10-04 MED ORDER — FENTANYL CITRATE (PF) 100 MCG/2ML IJ SOLN
INTRAMUSCULAR | Status: DC | PRN
  Administered 2019-10-04 (×2): 25 ug via INTRAVENOUS

## 2019-10-04 MED ORDER — ACETAMINOPHEN 325 MG PO TABS: 650.0000 mg | ORAL_TABLET | ORAL | Status: DC | PRN

## 2019-10-04 MED ORDER — LABETALOL HCL 5 MG/ML IV SOLN: 10.0000 mg | INTRAVENOUS | Status: DC | PRN

## 2019-10-04 MED ORDER — FENTANYL CITRATE (PF) 100 MCG/2ML IJ SOLN
INTRAMUSCULAR | Status: AC
  Filled 2019-10-04: qty 2

## 2019-10-04 MED ORDER — HYDRALAZINE HCL 20 MG/ML IJ SOLN: 10.0000 mg | INTRAMUSCULAR | Status: DC | PRN

## 2019-10-04 MED ORDER — ASPIRIN 81 MG PO CHEW: 81.0000 mg | CHEWABLE_TABLET | ORAL | Status: DC

## 2019-10-04 SURGICAL SUPPLY — 12 items
CATH INFINITI 5 FR MPA2 (CATHETERS) ×2 IMPLANT
CATH INFINITI 5 FR RCB (CATHETERS) ×2 IMPLANT
CATH INFINITI 5FR MULTPACK ANG (CATHETERS) ×2 IMPLANT
DEVICE RAD COMP TR BAND LRG (VASCULAR PRODUCTS) ×2 IMPLANT
GLIDESHEATH SLEND SS 6F .021 (SHEATH) ×2 IMPLANT
GUIDEWIRE INQWIRE 1.5J.035X260 (WIRE) ×1 IMPLANT
INQWIRE 1.5J .035X260CM (WIRE) ×2
KIT HEART LEFT (KITS) ×2 IMPLANT
PACK CARDIAC CATHETERIZATION (CUSTOM PROCEDURE TRAY) ×2 IMPLANT
SHEATH PROBE COVER 6X72 (BAG) ×2 IMPLANT
TRANSDUCER W/STOPCOCK (MISCELLANEOUS) ×2 IMPLANT
TUBING CIL FLEX 10 FLL-RA (TUBING) ×2 IMPLANT

## 2019-10-04 NOTE — Interval H&P Note (Signed)
Cath Lab Visit (complete for each Cath Lab visit)  Clinical Evaluation Leading to the Procedure:   ACS: No.  Non-ACS:    Anginal Classification: CCS III  Anti-ischemic medical therapy: Maximal Therapy (2 or more classes of medications)  Non-Invasive Test Results: No non-invasive testing performed  Prior CABG: Previous CABG      History and Physical Interval Note:  10/04/2019 12:59 PM  [name]  has presented today for surgery, with the diagnosis of chest pain.  The various methods of treatment have been discussed with the patient and family. After consideration of risks, benefits and other options for treatment, the patient has consented to  Procedure(s): LEFT HEART CATH AND CORS/GRAFTS ANGIOGRAPHY (N/A) as a surgical intervention.  The patient's history has been reviewed, patient examined, no change in status, stable for surgery.  I have reviewed the patient's chart and labs.  Questions were answered to the patient's satisfaction.     Tonny Bollman

## 2019-10-04 NOTE — Discharge Instructions (Signed)
Radial Site Care  This sheet gives you information about how to care for yourself after your procedure. Your health care provider may also give you more specific instructions. If you have problems or questions, contact your health care provider. What can I expect after the procedure? After the procedure, it is common to have:  Bruising and tenderness at the catheter insertion area. Follow these instructions at home: Medicines  Take over-the-counter and prescription medicines only as told by your health care provider. Insertion site care  Follow instructions from your health care provider about how to take care of your insertion site. Make sure you: ? Wash your hands with soap and water before you change your bandage (dressing). If soap and water are not available, use hand sanitizer. ? Change your dressing as told by your health care provider. ? Leave stitches (sutures), skin glue, or adhesive strips in place. These skin closures may need to stay in place for 2 weeks or longer. If adhesive strip edges start to loosen and curl up, you may trim the loose edges. Do not remove adhesive strips completely unless your health care provider tells you to do that.  Check your insertion site every day for signs of infection. Check for: ? Redness, swelling, or pain. ? Fluid or blood. ? Pus or a bad smell. ? Warmth.  Do not take baths, swim, or use a hot tub until your health care provider approves.  You may shower 24-48 hours after the procedure, or as directed by your health care provider. ? Remove the dressing and gently wash the site with plain soap and water. ? Pat the area dry with a clean towel. ? Do not rub the site. That could cause bleeding.  Do not apply powder or lotion to the site. Activity   For 24 hours after the procedure, or as directed by your health care provider: ? Do not flex or bend the affected arm. ? Do not push or pull heavy objects with the affected arm. ? Do not  drive yourself home from the hospital or clinic. You may drive 24 hours after the procedure unless your health care provider tells you not to. ? Do not operate machinery or power tools.  Do not lift anything that is heavier than 10 lb (4.5 kg), or the limit that you are told, until your health care provider says that it is safe.  Ask your health care provider when it is okay to: ? Return to work or school. ? Resume usual physical activities or sports. ? Resume sexual activity. General instructions  If the catheter site starts to bleed, raise your arm and put firm pressure on the site. If the bleeding does not stop, get help right away. This is a medical emergency.  If you went home on the same day as your procedure, a responsible adult should be with you for the first 24 hours after you arrive home.  Keep all follow-up visits as told by your health care provider. This is important. Contact a health care provider if:  You have a fever.  You have redness, swelling, or yellow drainage around your insertion site. Get help right away if:  You have unusual pain at the radial site.  The catheter insertion area swells very fast.  The insertion area is bleeding, and the bleeding does not stop when you hold steady pressure on the area.  Your arm or hand becomes pale, cool, tingly, or numb. These symptoms may represent a serious problem   that is an emergency. Do not wait to see if the symptoms will go away. Get medical help right away. Call your local emergency services (911 in the [place] Do not drive yourself to the hospital. Summary  After the procedure, it is common to have bruising and tenderness at the site.  Follow instructions from your health care provider about how to take care of your radial site wound. Check the wound every day for signs of infection.  Do not lift anything that is heavier than 10 lb (4.5 kg), or the limit that you are told, until your health care provider says  that it is safe. This information is not intended to replace advice given to you by your health care provider. Make sure you discuss any questions you have with your health care provider. Document Revised: 04/28/2017 Document Reviewed: 04/28/2017 Elsevier Patient Education  2020 [employer].  

## 2019-10-04 NOTE — Progress Notes (Addendum)
Ambulated in hallway and to the bathroom to void tol well.  

## 2019-10-04 NOTE — Progress Notes (Signed)
Discharge instructions reviewed with pt and his wife (via telephone) both voice understanding.  

## 2019-10-05 ENCOUNTER — Encounter (HOSPITAL_COMMUNITY): Payer: Self-pay | Admitting: Cardiovascular Disease

## 2019-10-18 ENCOUNTER — Ambulatory Visit: Payer: Medicare Other | Admitting: Physician Assistant

## 2019-10-23 ENCOUNTER — Other Ambulatory Visit: Payer: Self-pay | Admitting: Physician Assistant

## 2019-11-14 DIAGNOSIS — E7800 Pure hypercholesterolemia, unspecified: Secondary | ICD-10-CM | POA: Diagnosis not present

## 2019-11-14 DIAGNOSIS — E1165 Type 2 diabetes mellitus with hyperglycemia: Secondary | ICD-10-CM | POA: Diagnosis not present

## 2019-11-14 DIAGNOSIS — I10 Essential (primary) hypertension: Secondary | ICD-10-CM | POA: Diagnosis not present

## 2019-11-27 NOTE — Progress Notes (Signed)
Cardiology Office Note:    Date:  11/28/2019   ID:  [name], DOB 01/04/1951, MRN [id number]  PCP:  Celene Squibb, MD  Cardiologist:  Sherren Mocha, MD   Electrophysiologist:  None   Referring MD: Celene Squibb, MD   Chief Complaint:  Follow-up (CAD)    Patient Profile:    [name] is a 69 y.o. male with:   Coronary artery disease  ? S/p CABG in 2009 ? Cath 6/21: patent grafts, Med Rx   Hypertension   Hyperlipidemia   Diabetes mellitus   Hx of diabetic foot ulcer  PAD  ? eval by Dr. Fletcher Anon in 2019 >>small vessel dz >>med Rx   S/p cryptogenic CVA 01/2019 ? 2 vascular territories (R temporoparietal and occipital) - ?cardioembolic  ? Event monitor done by PCP  Prior CV studies: Cardiac catheterization Oct 14, 2019 LAD occluded LCx occluded Subtotal calcific occlusion of the RCA and PDA L-LAD patent S-OM/R PLA patent S-RPDA patent S-Dx patent EF 60   Event monitor 02/2019 Sinus rhythm, HR 48-99 No atrial fibrillation  Echocardiogram 02/01/2019 EF 60-65, mod LVH, mildly reduced RVSF, trace MR, mild TR  Carotid US 01/31/2019 IMPRESSION: Color duplex indicates minimal heterogeneous and calcified plaque, with no hemodynamically significant stenosis by duplex criteria in the extracranial cerebrovascular circulation.  Echocardiogram 10/29/2017 Mild LVH, EF 65-78, normal diastolic function, trivial AI, moderate LAE  Nuclear stress test 10/29/2017 EF 50, fixed septal/anteroseptal, lateral/inferolateral defect suggesting artifact, no ischemia, low risk  ETT-Myoview (03/14/13): Ex 9:00, ECG with 1 mm ST depression inferiorly, primarily reversible medium-sized anteroseptal defect, EF 54% (intermediate risk).  Carotid US (04/2012): Mild plaque bilaterally (0-39%) =>repeat in 1 year.  History of Present Illness:    [name] was last seen in clinic in 09/2019.  He had symptoms concerning for unstable angina and I set him up for a  cardiac catheterization.  This demonstrated patent bypass grafts and stable coronary artery disease.  Med Rx was continued.  He returns for follow up.  He is here alone.  He continues to have occasional episodes of left-sided chest discomfort.  He has had this from time to time and it may last for several months and then go away.  He has not had significant shortness of breath or significant lower extremity swelling.  He was recently placed on Semaglutide and his sugars are much improved.  He really has not noticed as much arthralgias and myalgias since his sugars have improved.  Past Medical History:  Diagnosis Date  . Allergy   . CAD (coronary artery disease), native coronary artery    Multiple PCI procedures followed by CABG in 2009 with LIMA-LAD, SVG-D, seq SVG-OM and PLV, SVG-PDA. Echo (3/12): EF 60-65%, mild MR. // Nuc Stress Test 7/19:  EF 50, artifact, no ischemia, low risk   . Carotid artery disease ([place]) 01/31/2017   dopplers 1/14:  0-39% bilat  . Carotid stenosis    dopplers 1/14:  0-39% bilat  . Cataract    REMOVED  . Colon polyps 2006   Colonoscopy-Dr. Patterson(Tubular Adenoma)   . Coronary Artery Disease 06/03/2008   Annotation: S/P CABG 2009 Qualifier: Diagnosis of  By: Olevia Perches, MD, Glenetta Hew   . DM2 (diabetes mellitus, type 2) ([place])    Followed by Dr. Carlis Abbott  . Essential hypertension 06/03/2008   Qualifier: Diagnosis of  By: Olevia Perches, MD, Glenetta Hew   . History of echocardiogram    Echo 7/19: Mild LVH, EF 46-96, normal diastolic  function, trivial AI, moderate LAE  . HLD (hyperlipidemia)   . HTN (hypertension)   . Hx of cardiovascular stress test    ETT-Myoview (03/2013):  Anteroseptal reversible defect, EF 54%, intermediate risk.  [name] Hyperlipidemia 06/03/2008   Qualifier: Diagnosis of  By: Olevia Perches, MD, Glenetta Hew   . Ischemic stroke ([place]) 01/30/2019  . OSA (obstructive sleep apnea) 08/25/2011  . PAD (peripheral artery disease) ([place]) 12/10/2017  .  Postoperative atrial fibrillation ([place]) 2009  . Renal insufficiency 01/31/2019  . Stroke ([place])   . Type 2 diabetes mellitus with complication, with long-term current use of insulin ([place]) 06/03/2008   Qualifier: Diagnosis of  By: Olevia Perches, MD, Glenetta Hew   . Ulcer    left foot  . Varicose veins of lower extremities with other complications 09/08/7844    Current Medications: Current Meds  Medication Sig  . amitriptyline (ELAVIL) 75 MG tablet Take 75 mg by mouth at bedtime.  [name] amLODipine (NORVASC) 10 MG tablet Take 1 tablet (10 mg total) by mouth daily.  [name] aspirin EC 325 MG EC tablet Take 1 tablet (325 mg total) by mouth daily.  . clopidogrel (PLAVIX) 75 MG tablet Take 1 tablet (75 mg total) by mouth daily.  [name] ezetimibe (ZETIA) 10 MG tablet Take 1 tablet (10 mg total) by mouth daily.  . fenofibrate 54 MG tablet TAKE 1 TABLET BY MOUTH DAILY  . furosemide (LASIX) 40 MG tablet Take 1 tablet (40 mg total) by mouth daily.  [name] glucose monitoring kit (FREESTYLE) monitoring kit 1 each by Does not apply route as needed for other. Freestyle Libre  . isosorbide mononitrate (IMDUR) 30 MG 24 hr tablet Take 1.5 tablets (45 mg total) by mouth daily.  . metFORMIN (GLUCOPHAGE) 1000 MG tablet Take 1,000 mg by mouth 2 (two) times daily with a meal.  . metoprolol tartrate (LOPRESSOR) 100 MG tablet Take 1 tablet (100 mg total) by mouth 2 (two) times daily.  . nitroGLYCERIN (NITROSTAT) 0.4 MG SL tablet PLACE 1 TABLET UNDER THE TONGUE EVERY 5 MINUTES AS NEEDED FOR CHEST PAIN  . NOVOLOG MIX 70/30 FLEXPEN (70-30) 100 UNIT/ML Pen Inject 40-60 Units into the skin 2 (two) times daily. Per sliding scale  . OZEMPIC, 0.25 OR 0.5 MG/DOSE, 2 MG/1.5ML SOPN Inject 1 unit into the skin per week  . rosuvastatin (CRESTOR) 20 MG tablet TAKE 1 TABLET BY MOUTH EVERY DAY  . [DISCONTINUED] isosorbide mononitrate (IMDUR) 30 MG 24 hr tablet Take 1 tablet (30 mg total) by mouth daily.     Allergies:   Penicillins   Social History    Tobacco Use  . Smoking status: Never Smoker  . Smokeless tobacco: Former Systems developer    Types: Secondary school teacher  . Vaping Use: Never used  Substance Use Topics  . Alcohol use: No  . Drug use: No     Family Hx: The patient's family history includes Cancer in his mother; Colon cancer in his mother; Coronary artery disease in an other family member; Diabetes in his brother, father, and mother; Heart disease in his father, mother, and another family member; Hyperlipidemia in his father and mother; Hypertension in his father; Other in his brother and brother; Prostate cancer in his brother and father.  ROS   EKGs/Labs/Other Test Reviewed:    EKG:  EKG is not ordered today.  The ekg ordered today demonstrates n/a  Recent Labs: 08/07/2019: ALT 23 09/27/2019: BUN 25; Creatinine, Ser 1.29; Hemoglobin 14.9; Platelets 218; Potassium 4.5; Sodium  139   Recent Lipid Panel Lab Results  Component Value Date/Time   CHOL 121 08/07/2019 08:15 AM   TRIG 130 08/07/2019 08:15 AM   HDL 36 (L) 08/07/2019 08:15 AM   CHOLHDL 3.4 08/07/2019 08:15 AM   CHOLHDL 4.2 01/31/2019 04:44 AM   LDLCALC 62 08/07/2019 08:15 AM   LDLDIRECT 78.0 11/26/2014 10:10 AM    Physical Exam:    VS:  BP (!) 120/52   Pulse 67   Ht _0  (1.778 m)   Wt 240 lb (108.9 kg)   SpO2 92%   BMI 34.44 kg/m     Wt Readings from Last 3 Encounters:  11/28/19 240 lb (108.9 kg)  10/04/19 245 lb (111.1 kg)  09/27/19 252 lb (114.3 kg)     Constitutional:      Appearance: Healthy appearance. Not in distress.  Pulmonary:     Effort: Pulmonary effort is normal.     Breath sounds: No wheezing. No rales.  Cardiovascular:     Normal rate. Regular rhythm. Normal S1. Normal S2.     Murmurs: There is no murmur.     Comments: L wrist without hematoma  Edema:    Peripheral edema absent.  Abdominal:     Palpations: Abdomen is soft.  Musculoskeletal:     Cervical back: Neck supple. Skin:    General: Skin is warm and dry.    Neurological:     General: No focal deficit present.     Mental Status: Alert and oriented to person, place and time.     Cranial Nerves: Cranial nerves are intact.       ASSESSMENT & PLAN:    1. Coronary artery disease involving native coronary artery of native heart without angina pectoris History of CABG in 2009. Nuclear stress test in 2019 was low risk.  Cardiac catheterization in 09/2019 demonstrated patent bypass grafts.  He has occasional L sided chest pain and he may have microvascular ischemia.  Increase Isosorbide to 45 mg once daily.  Continue current dose of ASA, Clopidogrel, Amlodipine, Metoprolol, Rosuvastatin.  FU in 6 mos.   2. History of stroke Cryptogenic stroke in 01/2019.  He has been on ASA 325 mg since.  Event monitor did not show atrial fibrillation.  I would like for him to see EP to see if he should have an ILR placed to survey for atrial fibrillation.    -Refer to EP  3. Essential hypertension The patient's blood pressure is controlled on his current regimen.  Continue current therapy.    4. Pure hypercholesterolemia He did have issues with arthralgias and I wanted him to see our Lipid Clinic to see if we could get him on a PCSK9i and less of his statin.  However, he feels better with his GLP-1 agonist.  Continue current Rx.  I have asked him to contact me if his symptoms continue.  We can refer him to the Lipid Clinic at that point.    Dispo:  Return in about 6 months (around 05/30/2020) for Routine Follow Up, w/ [name], PA-C, in person.   Medication Adjustments/Labs and Tests Ordered: Current medicines are reviewed at length with the patient today.  Concerns regarding medicines are outlined above.  Tests Ordered: Orders Placed This Encounter  Procedures  . Ambulatory referral to Cardiac Electrophysiology   Medication Changes: Meds ordered this encounter  Medications  . isosorbide mononitrate (IMDUR) 30 MG 24 hr tablet    Sig: Take 1.5 tablets (45  mg total) by  mouth daily.    Dispense:  135 tablet    Refill:  1    Signed, [name], PA-C  11/28/2019 1:09 PM    [facility] [address], [place], [place]  [address] Phone: [phone]; Fax: [phone]

## 2019-11-28 ENCOUNTER — Ambulatory Visit (INDEPENDENT_AMBULATORY_CARE_PROVIDER_SITE_OTHER): Payer: Medicare Other | Admitting: Physician Assistant

## 2019-11-28 ENCOUNTER — Encounter: Payer: Self-pay | Admitting: Physician Assistant

## 2019-11-28 ENCOUNTER — Telehealth: Payer: Medicare Other | Admitting: Internal Medicine

## 2019-11-28 ENCOUNTER — Other Ambulatory Visit: Payer: Self-pay

## 2019-11-28 ENCOUNTER — Ambulatory Visit: Payer: Medicare Other | Admitting: Physician Assistant

## 2019-11-28 VITALS — BP 120/52 | HR 67 | Ht 70.0 in | Wt 240.0 lb

## 2019-11-28 DIAGNOSIS — I2510 Atherosclerotic heart disease of native coronary artery without angina pectoris: Secondary | ICD-10-CM

## 2019-11-28 DIAGNOSIS — I25119 Atherosclerotic heart disease of native coronary artery with unspecified angina pectoris: Secondary | ICD-10-CM | POA: Diagnosis not present

## 2019-11-28 DIAGNOSIS — I10 Essential (primary) hypertension: Secondary | ICD-10-CM

## 2019-11-28 DIAGNOSIS — Z8673 Personal history of transient ischemic attack (TIA), and cerebral infarction without residual deficits: Secondary | ICD-10-CM | POA: Diagnosis not present

## 2019-11-28 DIAGNOSIS — E7800 Pure hypercholesterolemia, unspecified: Secondary | ICD-10-CM

## 2019-11-28 MED ORDER — ISOSORBIDE MONONITRATE ER 30 MG PO TB24
45.0000 mg | ORAL_TABLET | Freq: Every day | ORAL | 1 refills | Status: DC
Start: 2019-11-28 — End: 2020-05-14

## 2019-11-28 NOTE — Patient Instructions (Signed)
Medication Instructions:  Your physician has recommended you make the following change in your medication:   1) Increase Imdur to 45 mg, 1.5 tablets by mouth once a day  *If you need a refill on your cardiac medications before your next appointment, please call your pharmacy*  Lab Work: None ordered today  Testing/Procedures: None ordered today  Follow-Up: At [facility], you and your health needs are our priority.  As part of our continuing mission to provide you with exceptional heart care, we have created designated Provider Care Teams.  These Care Teams include your primary Cardiologist (physician) and Advanced Practice Providers (APPs -  Physician Assistants and Nurse Practitioners) who all work together to provide you with the care you need, when you need it.  We recommend signing up for the patient portal called "MyChart".  Sign up information is provided on this After Visit Summary.  MyChart is used to connect with patients for Virtual Visits (Telemedicine).  Patients are able to view lab/test results, encounter notes, upcoming appointments, etc.  Non-urgent messages can be sent to your provider as well.   To learn more about what you can do with MyChart, go to [email or web address].    Your next appointment:   6 month(s)  The format for your next appointment:   In Person  Provider:   Tereso Newcomer, PA-C

## 2019-12-04 ENCOUNTER — Encounter: Payer: Self-pay | Admitting: Physician Assistant

## 2019-12-05 ENCOUNTER — Encounter: Payer: Self-pay | Admitting: General Practice

## 2019-12-12 DIAGNOSIS — E1165 Type 2 diabetes mellitus with hyperglycemia: Secondary | ICD-10-CM | POA: Diagnosis not present

## 2019-12-19 DIAGNOSIS — L851 Acquired keratosis [keratoderma] palmaris et plantaris: Secondary | ICD-10-CM | POA: Diagnosis not present

## 2019-12-19 DIAGNOSIS — B351 Tinea unguium: Secondary | ICD-10-CM | POA: Diagnosis not present

## 2019-12-19 DIAGNOSIS — E1142 Type 2 diabetes mellitus with diabetic polyneuropathy: Secondary | ICD-10-CM | POA: Diagnosis not present

## 2020-02-27 DIAGNOSIS — L851 Acquired keratosis [keratoderma] palmaris et plantaris: Secondary | ICD-10-CM | POA: Diagnosis not present

## 2020-02-27 DIAGNOSIS — B351 Tinea unguium: Secondary | ICD-10-CM | POA: Diagnosis not present

## 2020-02-27 DIAGNOSIS — E1142 Type 2 diabetes mellitus with diabetic polyneuropathy: Secondary | ICD-10-CM | POA: Diagnosis not present

## 2020-03-20 DIAGNOSIS — E1165 Type 2 diabetes mellitus with hyperglycemia: Secondary | ICD-10-CM | POA: Diagnosis not present

## 2020-03-20 DIAGNOSIS — E7800 Pure hypercholesterolemia, unspecified: Secondary | ICD-10-CM | POA: Diagnosis not present

## 2020-03-27 DIAGNOSIS — E1165 Type 2 diabetes mellitus with hyperglycemia: Secondary | ICD-10-CM | POA: Diagnosis not present

## 2020-03-27 DIAGNOSIS — N189 Chronic kidney disease, unspecified: Secondary | ICD-10-CM | POA: Diagnosis not present

## 2020-03-27 DIAGNOSIS — E7800 Pure hypercholesterolemia, unspecified: Secondary | ICD-10-CM | POA: Diagnosis not present

## 2020-03-27 DIAGNOSIS — I2510 Atherosclerotic heart disease of native coronary artery without angina pectoris: Secondary | ICD-10-CM | POA: Diagnosis not present

## 2020-03-27 DIAGNOSIS — I639 Cerebral infarction, unspecified: Secondary | ICD-10-CM | POA: Diagnosis not present

## 2020-03-27 DIAGNOSIS — E1140 Type 2 diabetes mellitus with diabetic neuropathy, unspecified: Secondary | ICD-10-CM | POA: Diagnosis not present

## 2020-03-27 DIAGNOSIS — I10 Essential (primary) hypertension: Secondary | ICD-10-CM | POA: Diagnosis not present

## 2020-05-14 ENCOUNTER — Other Ambulatory Visit: Payer: Self-pay | Admitting: Physician Assistant

## 2020-05-14 ENCOUNTER — Other Ambulatory Visit: Payer: Self-pay | Admitting: Cardiovascular Disease

## 2020-05-16 DIAGNOSIS — M2042 Other hammer toe(s) (acquired), left foot: Secondary | ICD-10-CM | POA: Diagnosis not present

## 2020-05-16 DIAGNOSIS — M24574 Contracture, right foot: Secondary | ICD-10-CM | POA: Diagnosis not present

## 2020-05-16 DIAGNOSIS — M2011 Hallux valgus (acquired), right foot: Secondary | ICD-10-CM | POA: Diagnosis not present

## 2020-05-16 DIAGNOSIS — B351 Tinea unguium: Secondary | ICD-10-CM | POA: Diagnosis not present

## 2020-05-16 DIAGNOSIS — M2041 Other hammer toe(s) (acquired), right foot: Secondary | ICD-10-CM | POA: Diagnosis not present

## 2020-05-16 DIAGNOSIS — M2010 Hallux valgus (acquired), unspecified foot: Secondary | ICD-10-CM | POA: Diagnosis not present

## 2020-05-16 DIAGNOSIS — M2012 Hallux valgus (acquired), left foot: Secondary | ICD-10-CM | POA: Diagnosis not present

## 2020-05-16 DIAGNOSIS — L97512 Non-pressure chronic ulcer of other part of right foot with fat layer exposed: Secondary | ICD-10-CM | POA: Diagnosis not present

## 2020-05-16 DIAGNOSIS — M2040 Other hammer toe(s) (acquired), unspecified foot: Secondary | ICD-10-CM | POA: Diagnosis not present

## 2020-05-16 DIAGNOSIS — E1140 Type 2 diabetes mellitus with diabetic neuropathy, unspecified: Secondary | ICD-10-CM | POA: Diagnosis not present

## 2020-05-28 NOTE — Progress Notes (Unsigned)
Cardiology Office Note:    Date:  05/29/2020   ID:  [name], DOB 01-Jan-1951, MRN [id number]  PCP:  Celene Squibb, [place]  Cardiologist:  Sherren Mocha, MD   Advanced Practice Provider:  Liliane Shi, PA-C Electrophysiologist:  None       Referring MD: Celene Squibb, MD   Chief Complaint:  Follow-up (CAD)    Patient Profile:    [name] is a 70 y.o. male with:   Coronary artery disease  ? S/p CABG in 2009 ? Cath 6/21: patent grafts, Med Rx   Hypertension   Hyperlipidemia   Diabetes mellitus   Hx of diabetic foot ulcer  PAD  ? eval by Dr. Fletcher Anon in 2019 >>small vessel dz >>med Rx   S/p cryptogenic CVA 01/2019 ? 2 vascular territories (R temporoparietal and occipital) - ?cardioembolic  ? Event monitor done by PCP  Prior CV studies: Cardiac catheterization 10-16-2019 LAD occluded LCx occluded Subtotal calcific occlusion of the RCA and PDA L-LAD patent S-OM/R PLA patent S-RPDA patent S-Dx patent EF 60   Event monitor 02/2019 Sinus rhythm, HR 48-99 No atrial fibrillation  Echocardiogram 02/01/2019 EF 60-65, mod LVH, mildly reduced RVSF, trace MR, mild TR  Carotid US 01/31/2019 IMPRESSION: Color duplex indicates minimal heterogeneous and calcified plaque, with no hemodynamically significant stenosis by duplex criteria in the extracranial cerebrovascular circulation.  Echocardiogram 10/29/2017 Mild LVH, EF 54-56, normal diastolic function, trivial AI, moderate LAE  Nuclear stress test 10/29/2017 EF 50, fixed septal/anteroseptal, lateral/inferolateral defect suggesting artifact, no ischemia, low risk  ETT-Myoview (03/14/13): Ex 9:00, ECG with 1 mm ST depression inferiorly, primarily reversible medium-sized anteroseptal defect, EF 54% (intermediate risk).  Carotid US (04/2012): Mild plaque bilaterally (0-39%) =>repeat in 1 year.  History of Present Illness:    [name] was last  seen in 11/28/19.  I had recommended a referral to EP to consider an ILR given his prior stroke.  However, our EP scheduler could not reach him to schedule an appt.  He returns for f/u.  He is here alone.  He has been doing well since last seen.  He has rare angina on his current medical regimen.  He has mild dyspnea on exertion with more extreme activities.  He has not had syncope, near syncope.  He has not had significant bleeding issues other than some bruising.  He went hunting in [place] this winter and did well with all the walking.  He still works as a Games developer several days a week.  His wife had COVID this winter and she was pretty sick.  She had the mAb infusion and required steroids.  Her sugar was pretty high.  He has been working on his diabetes and his A1c is now in the 7 range.     Past Medical History:  Diagnosis Date  . Allergy   . CAD (coronary artery disease), native coronary artery    Multiple PCI procedures followed by CABG in 2009 with LIMA-LAD, SVG-D, seq SVG-OM and PLV, SVG-PDA. Echo (3/12): EF 60-65%, mild MR. // Nuc Stress Test 7/19:  EF 50, artifact, no ischemia, low risk   . Carotid artery disease ([place]) 01/31/2017   dopplers 1/14:  0-39% bilat  . Carotid stenosis    dopplers 1/14:  0-39% bilat  . Cataract    REMOVED  . Colon polyps 2006   Colonoscopy-Dr. Patterson(Tubular Adenoma)   . Coronary Artery Disease 06/03/2008   Annotation: S/P CABG  2009 Qualifier: Diagnosis of  By: Olevia Perches, MD, Glenetta Hew   . DM2 (diabetes mellitus, type 2) ([place])    Followed by Dr. Carlis Abbott  . Essential hypertension 06/03/2008   Qualifier: Diagnosis of  By: Olevia Perches, MD, Glenetta Hew   . History of echocardiogram    Echo 7/19: Mild LVH, EF 55-97, normal diastolic function, trivial AI, moderate LAE  . HLD (hyperlipidemia)   . HTN (hypertension)   . Hx of cardiovascular stress test    ETT-Myoview (03/2013):  Anteroseptal reversible defect, EF 54%, intermediate risk.  [name]  Hyperlipidemia 06/03/2008   Qualifier: Diagnosis of  By: Olevia Perches, MD, Glenetta Hew   . Ischemic stroke ([place]) 01/30/2019  . OSA (obstructive sleep apnea) 08/25/2011  . PAD (peripheral artery disease) ([place]) 12/10/2017  . Postoperative atrial fibrillation ([place]) 2009  . Renal insufficiency 01/31/2019  . Stroke ([place])   . Type 2 diabetes mellitus with complication, with long-term current use of insulin ([place]) 06/03/2008   Qualifier: Diagnosis of  By: Olevia Perches, MD, Glenetta Hew   . Ulcer    left foot  . Varicose veins of lower extremities with other complications 07/06/6382    Current Medications: Current Meds  Medication Sig  . amitriptyline (ELAVIL) 75 MG tablet Take 75 mg by mouth at bedtime.  [name] amLODipine (NORVASC) 10 MG tablet TAKE 1 TABLET BY MOUTH EVERY DAY  . aspirin EC 325 MG EC tablet Take 1 tablet (325 mg total) by mouth daily.  . clopidogrel (PLAVIX) 75 MG tablet TAKE 1 TABLET BY MOUTH EVERY DAY  . ezetimibe (ZETIA) 10 MG tablet TAKE 1 TABLET BY MOUTH EVERY DAY  . fenofibrate 54 MG tablet TAKE 1 TABLET BY MOUTH EVERY DAY  . furosemide (LASIX) 40 MG tablet Take 1 tablet (40 mg total) by mouth daily.  [name] glucose monitoring kit (FREESTYLE) monitoring kit 1 each by Does not apply route as needed for other. Freestyle Libre  . isosorbide mononitrate (IMDUR) 30 MG 24 hr tablet TAKE 1.5 TABLETS (45 MG TOTAL) BY MOUTH DAILY.  . metFORMIN (GLUCOPHAGE) 1000 MG tablet Take 1,000 mg by mouth 2 (two) times daily with a meal.  . metoprolol tartrate (LOPRESSOR) 100 MG tablet TAKE 1 TABLET BY MOUTH TWICE A DAY  . nitroGLYCERIN (NITROSTAT) 0.4 MG SL tablet PLACE 1 TABLET UNDER THE TONGUE EVERY 5 MINUTES AS NEEDED FOR CHEST PAIN  . NOVOLOG MIX 70/30 FLEXPEN (70-30) 100 UNIT/ML Pen Inject 40-60 Units into the skin 2 (two) times daily. Per sliding scale  . OZEMPIC, 0.25 OR 0.5 MG/DOSE, 2 MG/1.5ML SOPN Inject 1 unit into the skin per week  . rosuvastatin (CRESTOR) 20 MG tablet TAKE 1 TABLET BY MOUTH  EVERY DAY     Allergies:   Penicillins   Social History   Tobacco Use  . Smoking status: Never Smoker  . Smokeless tobacco: Former Systems developer    Types: Secondary school teacher  . Vaping Use: Never used  Substance Use Topics  . Alcohol use: No  . Drug use: No     Family Hx: The patient's family history includes Cancer in his mother; Colon cancer in his mother; Coronary artery disease in an other family member; Diabetes in his brother, father, and mother; Heart disease in his father, mother, and another family member; Hyperlipidemia in his father and mother; Hypertension in his father; Other in his brother and brother; Prostate cancer in his brother and father.  ROS   EKGs/Labs/Other Test Reviewed:    EKG:  EKG is not ordered today.  The ekg ordered today demonstrates n/a  Recent Labs: 08/07/2019: ALT 23 09/27/2019: BUN 25; Creatinine, Ser 1.29; Hemoglobin 14.9; Platelets 218; Potassium 4.5; Sodium 139   Recent Lipid Panel Lab Results  Component Value Date/Time   CHOL 121 08/07/2019 08:15 AM   TRIG 130 08/07/2019 08:15 AM   HDL 36 (L) 08/07/2019 08:15 AM   CHOLHDL 3.4 08/07/2019 08:15 AM   CHOLHDL 4.2 01/31/2019 04:44 AM   LDLCALC 62 08/07/2019 08:15 AM   LDLDIRECT 78.0 11/26/2014 10:10 AM   Labs obtained through [facility] - personally reviewed and interpreted: 03/20/2020: LDL 32, triglycerides 182   Risk Assessment/Calculations:      Physical Exam:    VS:  BP (!) 102/40   Pulse 66   Ht _0  (1.778 m)   Wt 239 lb (108.4 kg)   SpO2 96%   BMI 34.29 kg/m     Wt Readings from Last 3 Encounters:  05/29/20 239 lb (108.4 kg)  11/28/19 240 lb (108.9 kg)  10/04/19 245 lb (111.1 kg)     Constitutional:      Appearance: Healthy appearance. Not in distress.  Neck:     Vascular: No JVR. JVD normal.  Pulmonary:     Effort: Pulmonary effort is normal.     Breath sounds: No wheezing. No rales.  Cardiovascular:     Normal rate. Regular rhythm. Normal S1. Normal S2.      Murmurs: There is no murmur.  Edema:    Ankle: bilateral 1+ edema of the ankle. Abdominal:     Palpations: Abdomen is soft.  Skin:    General: Skin is warm and dry.  Neurological:     General: No focal deficit present.     Mental Status: Alert and oriented to person, place and time.     Cranial Nerves: Cranial nerves are intact.       ASSESSMENT & PLAN:    1. Coronary artery disease involving native coronary artery of native heart with angina pectoris ([place]) History of CABG in 2009. Nuclear stress test in 2019 was low risk.Cardiac catheterization in 09/2019 demonstrated patent bypass grafts.  He has rare anginal symptoms.  Overall, his symptoms are controlled on his current regimen which includes amlodipine, aspirin, clopidogrel, ezetimibe, isosorbide mononitrate, metoprolol tartrate, rosuvastatin.  Continue current therapy.  Follow-up in 6 months.  2. Essential hypertension The patient's blood pressure is controlled on his current regimen.  Continue current therapy.   3. Pure hypercholesterolemia LDL optimal on most recent lab work.  Continue current Rx.    4. History of stroke We again discussed the rationale for seeing EP and considering implantation of an ILR.  He will let me know if he changes his mind.   Dispo:  Return in about 6 months (around 11/26/2020) for Routine Follow Up, w/ [name], PA-C, in person.   Medication Adjustments/Labs and Tests Ordered: Current medicines are reviewed at length with the patient today.  Concerns regarding medicines are outlined above.  Tests Ordered: No orders of the defined types were placed in this encounter.  Medication Changes: No orders of the defined types were placed in this encounter.   Signed, [name], PA-C  05/29/2020 8:50 AM    [facility] [address], [place], [place]  [address] Phone: [phone]; Fax: [phone]

## 2020-05-29 ENCOUNTER — Other Ambulatory Visit: Payer: Self-pay

## 2020-05-29 ENCOUNTER — Encounter: Payer: Self-pay | Admitting: Physician Assistant

## 2020-05-29 ENCOUNTER — Ambulatory Visit: Payer: Medicare HMO | Admitting: Physician Assistant

## 2020-05-29 VITALS — BP 102/40 | HR 66 | Ht 70.0 in | Wt 239.0 lb

## 2020-05-29 DIAGNOSIS — Z8673 Personal history of transient ischemic attack (TIA), and cerebral infarction without residual deficits: Secondary | ICD-10-CM

## 2020-05-29 DIAGNOSIS — I25119 Atherosclerotic heart disease of native coronary artery with unspecified angina pectoris: Secondary | ICD-10-CM | POA: Diagnosis not present

## 2020-05-29 DIAGNOSIS — I2510 Atherosclerotic heart disease of native coronary artery without angina pectoris: Secondary | ICD-10-CM

## 2020-05-29 DIAGNOSIS — I10 Essential (primary) hypertension: Secondary | ICD-10-CM | POA: Diagnosis not present

## 2020-05-29 DIAGNOSIS — E7800 Pure hypercholesterolemia, unspecified: Secondary | ICD-10-CM | POA: Diagnosis not present

## 2020-05-29 NOTE — Patient Instructions (Signed)
Medication Instructions:  Your physician recommends that you continue on your current medications as directed. Please refer to the Current Medication list given to you today.  *If you need a refill on your cardiac medications before your next appointment, please call your pharmacy*   Lab Work: none If you have labs (blood work) drawn today and your tests are completely normal, you will receive your results only by: [name] MyChart Message (if you have MyChart) OR . A paper copy in the mail If you have any lab test that is abnormal or we need to change your treatment, we will call you to review the results.   Testing/Procedures: none   Follow-Up: At [facility], you and your health needs are our priority.  As part of our continuing mission to provide you with exceptional heart care, we have created designated Provider Care Teams.  These Care Teams include your primary Cardiologist (physician) and Advanced Practice Providers (APPs -  Physician Assistants and Nurse Practitioners) who all work together to provide you with the care you need, when you need it.  We recommend signing up for the patient portal called "MyChart".  Sign up information is provided on this After Visit Summary.  MyChart is used to connect with patients for Virtual Visits (Telemedicine).  Patients are able to view lab/test results, encounter notes, upcoming appointments, etc.  Non-urgent messages can be sent to your provider as well.   To learn more about what you can do with MyChart, go to [email or web address].    Your next appointment:   6 month(s)  The format for your next appointment:   In Person  Provider:   You may see Tonny Bollman, MD or one of the following Advanced Practice Providers on your designated Care Team:    Tereso Newcomer, PA-C  Chelsea Aus, [place]    Other Instructions

## 2020-06-06 DIAGNOSIS — E1140 Type 2 diabetes mellitus with diabetic neuropathy, unspecified: Secondary | ICD-10-CM | POA: Diagnosis not present

## 2020-06-06 DIAGNOSIS — M2010 Hallux valgus (acquired), unspecified foot: Secondary | ICD-10-CM | POA: Diagnosis not present

## 2020-06-06 DIAGNOSIS — M2040 Other hammer toe(s) (acquired), unspecified foot: Secondary | ICD-10-CM | POA: Diagnosis not present

## 2020-06-06 DIAGNOSIS — M24576 Contracture, unspecified foot: Secondary | ICD-10-CM | POA: Diagnosis not present

## 2020-06-06 DIAGNOSIS — L97512 Non-pressure chronic ulcer of other part of right foot with fat layer exposed: Secondary | ICD-10-CM | POA: Diagnosis not present

## 2020-06-10 DIAGNOSIS — I639 Cerebral infarction, unspecified: Secondary | ICD-10-CM | POA: Diagnosis not present

## 2020-06-10 DIAGNOSIS — E7800 Pure hypercholesterolemia, unspecified: Secondary | ICD-10-CM | POA: Diagnosis not present

## 2020-06-10 DIAGNOSIS — I2510 Atherosclerotic heart disease of native coronary artery without angina pectoris: Secondary | ICD-10-CM | POA: Diagnosis not present

## 2020-06-10 DIAGNOSIS — N189 Chronic kidney disease, unspecified: Secondary | ICD-10-CM | POA: Diagnosis not present

## 2020-06-10 DIAGNOSIS — E1140 Type 2 diabetes mellitus with diabetic neuropathy, unspecified: Secondary | ICD-10-CM | POA: Diagnosis not present

## 2020-06-10 DIAGNOSIS — I10 Essential (primary) hypertension: Secondary | ICD-10-CM | POA: Diagnosis not present

## 2020-06-10 DIAGNOSIS — E1165 Type 2 diabetes mellitus with hyperglycemia: Secondary | ICD-10-CM | POA: Diagnosis not present

## 2020-06-12 DIAGNOSIS — M79672 Pain in left foot: Secondary | ICD-10-CM | POA: Diagnosis not present

## 2020-06-12 DIAGNOSIS — L97509 Non-pressure chronic ulcer of other part of unspecified foot with unspecified severity: Secondary | ICD-10-CM | POA: Diagnosis not present

## 2020-06-12 DIAGNOSIS — M79671 Pain in right foot: Secondary | ICD-10-CM | POA: Diagnosis not present

## 2020-07-11 ENCOUNTER — Encounter (HOSPITAL_BASED_OUTPATIENT_CLINIC_OR_DEPARTMENT_OTHER): Payer: Medicare HMO | Attending: Internal Medicine | Admitting: Internal Medicine

## 2020-07-11 ENCOUNTER — Other Ambulatory Visit: Payer: Self-pay

## 2020-07-11 ENCOUNTER — Other Ambulatory Visit: Payer: Self-pay | Admitting: Internal Medicine

## 2020-07-11 DIAGNOSIS — Z951 Presence of aortocoronary bypass graft: Secondary | ICD-10-CM | POA: Diagnosis not present

## 2020-07-11 DIAGNOSIS — L97529 Non-pressure chronic ulcer of other part of left foot with unspecified severity: Secondary | ICD-10-CM | POA: Insufficient documentation

## 2020-07-11 DIAGNOSIS — I10 Essential (primary) hypertension: Secondary | ICD-10-CM | POA: Diagnosis not present

## 2020-07-11 DIAGNOSIS — I2510 Atherosclerotic heart disease of native coronary artery without angina pectoris: Secondary | ICD-10-CM | POA: Diagnosis not present

## 2020-07-11 DIAGNOSIS — E11621 Type 2 diabetes mellitus with foot ulcer: Secondary | ICD-10-CM | POA: Insufficient documentation

## 2020-07-11 DIAGNOSIS — E1140 Type 2 diabetes mellitus with diabetic neuropathy, unspecified: Secondary | ICD-10-CM | POA: Insufficient documentation

## 2020-07-11 MED ORDER — CEPHALEXIN 500 MG PO CAPS
500.0000 mg | ORAL_CAPSULE | Freq: Two times a day (BID) | ORAL | 0 refills | Status: DC
Start: 2020-07-11 — End: 2020-07-11

## 2020-07-11 NOTE — Progress Notes (Signed)
[name], [name] ([id number]) Visit Report for 07/11/2020 Allergy List Details Patient Name: Date of Service: [name], [name]. 07/11/2020 10:30 A M Medical Record Number: [id number] Patient Account Number: [id number] Date of Birth/Sex: Treating RN: 1950-09-15 (70 y.o. [name] Primary Care Darriona Dehaas: Allyn Kenner Other Clinician: Referring Yareth Kearse: Treating Deniz Eskridge/Extender: Dierdre Harness in Treatment: 0 Allergies Active Allergies penicillin Reaction: rash Allergy Notes Electronic Signature(s) Signed: 07/11/2020 5:37:05 PM By: Levan Hurst RN, BSN Entered By: Levan Hurst on 07/11/2020 10:59:02 -------------------------------------------------------------------------------- Arrival Information Details Patient Name: Date of Service: [name]. 07/11/2020 10:30 A M Medical Record Number: [id number] Patient Account Number: [id number] Date of Birth/Sex: Treating RN: [birth year] (70 y.o. [name] Primary Care Aalani Aikens: Allyn Kenner Other Clinician: Referring Tamar Lipscomb: Treating Jalexis Breed/Extender: Dierdre Harness in Treatment: 0 Visit Information Patient Arrived: Ambulatory Arrival Time: 10:53 Accompanied By: alone Transfer Assistance: None Patient Identification Verified: Yes Secondary Verification Process Completed: Yes Patient Requires Transmission-Based Precautions: No Patient Has Alerts: Yes Patient Alerts: Patient on Blood Thinner R ABI non compressible History Since Last Visit Added or deleted any medications: No Any new allergies or adverse reactions: No Had a fall or experienced change in activities of daily living that may affect risk of falls: No Signs or symptoms of abuse/neglect since last visito No Hospitalized since last visit: No Implantable device outside of the clinic excluding cellular tissue based products placed in the center since last visit: No Electronic Signature(s) Signed: 07/11/2020 5:37:05  PM By: Levan Hurst RN, BSN Entered By: Levan Hurst on 07/11/2020 11:46:47 -------------------------------------------------------------------------------- Clinic Level of Care Assessment Details Patient Name: Date of Service: [name]. 07/11/2020 10:30 A M Medical Record Number: [id number] Patient Account Number: [id number] Date of Birth/Sex: Treating RN: 1950-05-22 (70 y.o. [name] Primary Care Esgar Barnick: Allyn Kenner Other Clinician: Referring Yamari Ventola: Treating Krishiv Sandler/Extender: Dierdre Harness in Treatment: 0 Clinic Level of Care Assessment Items TOOL 1 Quantity Score X- 1 0 Use when EandM and Procedure is performed on INITIAL visit ASSESSMENTS - Nursing Assessment / Reassessment X- 1 20 General Physical Exam (combine w/ comprehensive assessment (listed just below) when performed on new pt. evals) X- 1 25 Comprehensive Assessment (HX, ROS, Risk Assessments, Wounds Hx, etc.) ASSESSMENTS - Wound and Skin Assessment / Reassessment []  - 0 Dermatologic / Skin Assessment (not related to wound area) ASSESSMENTS - Ostomy and/or Continence Assessment and Care []  - 0 Incontinence Assessment and Management []  - 0 Ostomy Care Assessment and Management (repouching, etc.) PROCESS - Coordination of Care X - Simple Patient / Family Education for ongoing care 1 15 []  - 0 Complex (extensive) Patient / Family Education for ongoing care X- 1 10 Staff obtains Programmer, systems, Records, T Results / Process Orders est []  - 0 Staff telephones HHA, Nursing Homes / Clarify orders / etc []  - 0 Routine Transfer to another Facility (non-emergent condition) []  - 0 Routine Hospital Admission (non-emergent condition) X- 1 15 New Admissions / Biomedical engineer / Ordering NPWT Apligraf, etc. , []  - 0 Emergency Hospital Admission (emergent condition) PROCESS - Special Needs []  - 0 Pediatric / Minor Patient Management []  - 0 Isolation Patient Management []  -  0 Hearing / Language / Visual special needs []  - 0 Assessment of Community assistance (transportation, D/C planning, etc.) []  - 0 Additional assistance / Altered mentation []  - 0 Support Surface(s) Assessment (bed, cushion, seat, etc.) INTERVENTIONS - Miscellaneous []  - 0 External ear exam []  - 0  Patient Transfer (multiple staff / Civil Service fast streamer / Similar devices) []  - 0 Simple Staple / Suture removal (25 or less) []  - 0 Complex Staple / Suture removal (26 or more) []  - 0 Hypo/Hyperglycemic Management (do not check if billed separately) X- 1 15 Ankle / Brachial Index (ABI) - do not check if billed separately Has the patient been seen at the hospital within the last three years: Yes Total Score: 100 Level Of Care: New/Established - Level 3 Electronic Signature(s) Signed: 07/11/2020 5:37:05 PM By: Levan Hurst RN, BSN Signed: 07/11/2020 5:37:05 PM By: Levan Hurst RN, BSN Entered By: Levan Hurst on 07/11/2020 15:59:26 -------------------------------------------------------------------------------- Encounter Discharge Information Details Patient Name: Date of Service: [name]. 07/11/2020 10:30 A M Medical Record Number: [id number] Patient Account Number: [id number] Date of Birth/Sex: Treating RN: 1950-05-31 (70 y.o. [name] Primary Care Oluwadarasimi Favor: Allyn Kenner Other Clinician: Referring Devika Dragovich: Treating Draylon Mercadel/Extender: Dierdre Harness in Treatment: 0 Encounter Discharge Information Items Post Procedure Vitals Discharge Condition: Stable Temperature (F): 98.4 Ambulatory Status: Ambulatory Pulse (bpm): 74 Discharge Destination: Home Respiratory Rate (breaths/min): 16 Transportation: Private Auto Blood Pressure (mmHg): 137/83 Accompanied By: alone Schedule Follow-up Appointment: Yes Clinical Summary of Care: Patient Declined Electronic Signature(s) Signed: 07/11/2020 5:37:05 PM By: Levan Hurst RN, BSN Entered By: Levan Hurst  on 07/11/2020 16:02:27 -------------------------------------------------------------------------------- Lower Extremity Assessment Details Patient Name: Date of Service: [name]. 07/11/2020 10:30 A M Medical Record Number: [id number] Patient Account Number: [id number] Date of Birth/Sex: Treating RN: [birth year] (70 y.o. [name] Primary Care Luciano Cinquemani: Allyn Kenner Other Clinician: Referring Shekira Drummer: Treating Lavonda Thal/Extender: Dierdre Harness in Treatment: 0 Edema Assessment Assessed: [Left: No] [Right: No] Edema: [Left: Ye] [Right: s] Calf Left: Right: Point of Measurement: 32 cm From Medial Instep 41 cm Ankle Left: Right: Point of Measurement: 11 cm From Medial Instep 27 cm Knee To Floor Left: Right: From Medial Instep 41 cm Vascular Assessment Pulses: Dorsalis Pedis Palpable: [Right:Yes] Electronic Signature(s) Signed: 07/11/2020 5:37:05 PM By: Levan Hurst RN, BSN Entered By: Levan Hurst on 07/11/2020 11:25:16 -------------------------------------------------------------------------------- Multi Wound Chart Details Patient Name: Date of Service: [name]. 07/11/2020 10:30 A M Medical Record Number: [id number] Patient Account Number: [id number] Date of Birth/Sex: Treating RN: 1950-12-27 (70 y.o. [name] Primary Care Yanice Maqueda: Allyn Kenner Other Clinician: Referring Capucine Tryon: Treating Sircharles Holzheimer/Extender: Dierdre Harness in Treatment: 0 Vital Signs Height(in): 70 Capillary Blood Glucose(mg/dl): 167 Weight(lbs): 225 Pulse(bpm): 22 Body Mass Index(BMI): 30 Blood Pressure(mmHg): 137/83 Temperature(F): 98.4 Respiratory Rate(breaths/min): 16 Photos: [4:No Photos Right Metatarsal head first] [N/A:N/A N/A] Wound Location: [4:Blister] [N/A:N/A] Wounding Event: [4:Diabetic Wound/Ulcer of the Lower] [N/A:N/A] Primary Etiology: [4:Extremity Cataracts, Sleep Apnea, Coronary] [N/A:N/A] Comorbid  History: [4:Artery Disease, Hypertension, Peripheral Arterial Disease, Type II Diabetes, Neuropathy 05/07/2020] [N/A:N/A] Date Acquired: [4:0] [N/A:N/A] Weeks of Treatment: [4:Open] [N/A:N/A] Wound Status: [4:0.4x0.2x0.3] [N/A:N/A] Measurements L x W x D (cm) [4:0.063] [N/A:N/A] A (cm) : rea [4:0.019] [N/A:N/A] Volume (cm) : [4:Grade 2] [N/A:N/A] Classification: [4:Medium] [N/A:N/A] Exudate A mount: [4:Serosanguineous] [N/A:N/A] Exudate Type: [4:red, brown] [N/A:N/A] Exudate Color: [4:Well defined, not attached] [N/A:N/A] Wound Margin: [4:Large (67-100%)] [N/A:N/A] Granulation A mount: [4:Red] [N/A:N/A] Granulation Quality: [4:None Present (0%)] [N/A:N/A] Necrotic A mount: [4:Fat Layer (Subcutaneous Tissue): Yes N/A] Exposed Structures: [4:Fascia: No Tendon: No Muscle: No Joint: No Bone: No None] [N/A:N/A] Epithelialization: [4:Debridement - Excisional] [N/A:N/A] Debridement: Pre-procedure Verification/Time Out 11:45 [N/A:N/A] Taken: [4:Lidocaine 5% topical ointment] [N/A:N/A] Pain Control: [4:Callus, Subcutaneous] [N/A:N/A] Tissue Debrided: [  4:Skin/Subcutaneous Tissue] [N/A:N/A] Level: [4:0.08] [N/A:N/A] Debridement A (sq cm): [4:rea Curette] [N/A:N/A] Instrument: [4:Minimum] [N/A:N/A] Bleeding: [4:Pressure] [N/A:N/A] Hemostasis A chieved: [4:0] [N/A:N/A] Procedural Pain: [4:0] [N/A:N/A] Post Procedural Pain: [4:Procedure was tolerated well] [N/A:N/A] Debridement Treatment Response: [4:0.4x0.2x0.3] [N/A:N/A] Post Debridement Measurements L x W x D (cm) [4:0.019] [N/A:N/A] Post Debridement Volume: (cm) [4:Debridement] [N/A:N/A] Treatment Notes Electronic Signature(s) Signed: 07/11/2020 5:18:30 PM By: Kalman Shan DO Signed: 07/11/2020 5:37:05 PM By: Levan Hurst RN, BSN Entered By: Kalman Shan on 07/11/2020 12:08:46 -------------------------------------------------------------------------------- Multi-Disciplinary Care Plan Details Patient Name: Date of  Service: [name]. 07/11/2020 10:30 A M Medical Record Number: [id number] Patient Account Number: [id number] Date of Birth/Sex: Treating RN: April 13, 1950 (70 y.o. [name] Primary Care Lakshya Mcgillicuddy: Allyn Kenner Other Clinician: Referring Davinity Fanara: Treating Rocklin Soderquist/Extender: Dierdre Harness in Treatment: 0 Multidisciplinary Care Plan reviewed with physician Active Inactive Nutrition Nursing Diagnoses: Impaired glucose control: actual or potential Potential for alteratiion in Nutrition/Potential for imbalanced nutrition Goals: Patient/caregiver agrees to and verbalizes understanding of need to use nutritional supplements and/or vitamins as prescribed Date Initiated: 07/11/2020 Target Resolution Date: 08/09/2020 Goal Status: Active Patient/caregiver will maintain therapeutic glucose control Date Initiated: 07/11/2020 Target Resolution Date: 08/09/2020 Goal Status: Active Interventions: Assess HgA1c results as ordered upon admission and as needed Assess patient nutrition upon admission and as needed per policy Provide education on elevated blood sugars and impact on wound healing Provide education on nutrition Notes: Wound/Skin Impairment Nursing Diagnoses: Impaired tissue integrity Knowledge deficit related to ulceration/compromised skin integrity Goals: Patient/caregiver will verbalize understanding of skin care regimen Date Initiated: 07/11/2020 Target Resolution Date: 08/09/2020 Goal Status: Active Ulcer/skin breakdown will have a volume reduction of 30% by week 4 Date Initiated: 07/11/2020 Target Resolution Date: 08/09/2020 Goal Status: Active Interventions: Assess patient/caregiver ability to obtain necessary supplies Assess patient/caregiver ability to perform ulcer/skin care regimen upon admission and as needed Assess ulceration(s) every visit Provide education on ulcer and skin care Notes: Electronic Signature(s) Signed: 07/11/2020 5:37:05 PM By:  Levan Hurst RN, BSN Entered By: Levan Hurst on 07/11/2020 11:43:42 -------------------------------------------------------------------------------- Pain Assessment Details Patient Name: Date of Service: [name]. 07/11/2020 10:30 A M Medical Record Number: [id number] Patient Account Number: [id number] Date of Birth/Sex: Treating RN: Jan 23, 1951 (70 y.o. [name] Primary Care Keya Wynes: Allyn Kenner Other Clinician: Referring Elspeth Blucher: Treating Quinto Tippy/Extender: Dierdre Harness in Treatment: 0 Active Problems Location of Pain Severity and Description of Pain Patient Has Paino No Site Locations Pain Management and Medication Current Pain Management: Electronic Signature(s) Signed: 07/11/2020 5:37:05 PM By: Levan Hurst RN, BSN Entered By: Levan Hurst on 07/11/2020 11:26:30 -------------------------------------------------------------------------------- Patient/Caregiver Education Details Patient Name: Date of Service: [name], [name] BERT W. 4/7/2022andnbsp10:30 [place] Record Number: [id number] Patient Account Number: [id number] Date of Birth/Gender: Treating RN: 19-Jun-1950 (70 y.o. [name] Primary Care Physician: Allyn Kenner Other Clinician: Referring Physician: Treating Physician/Extender: Dierdre Harness in Treatment: 0 Education Assessment Education Provided To: Patient Education Topics Provided Elevated Blood Sugar/ Impact on Healing: Methods: Explain/Verbal Responses: State content correctly Offloading: Methods: Explain/Verbal Responses: State content correctly Wound/Skin Impairment: Methods: Explain/Verbal Responses: State content correctly Electronic Signature(s) Signed: 07/11/2020 5:37:05 PM By: Levan Hurst RN, BSN Entered By: Levan Hurst on 07/11/2020 11:44:03 -------------------------------------------------------------------------------- Wound Assessment Details Patient  Name: Date of Service: [name]. 07/11/2020 10:30 A M Medical Record Number: [id number] Patient Account Number: [id number] Date of Birth/Sex: Treating RN: 27-Jun-1950 (70 y.o. [name] Primary Care Nathanel Tallman: [place] Crane,  John Other Clinician: Referring Jeffery Bachmeier: Treating [place]/Extender: Dierdre Harness in Treatment: 0 Wound Status Wound Number: 4 Primary Diabetic Wound/Ulcer of the Lower Extremity Etiology: Wound Location: Right Metatarsal head first Wound Open Wounding Event: Blister Status: Date Acquired: 05/07/2020 Comorbid Cataracts, Sleep Apnea, Coronary Artery Disease, Hypertension, Weeks Of Treatment: 0 History: Peripheral Arterial Disease, Type II Diabetes, Neuropathy Clustered Wound: No Photos Wound Measurements Length: (cm) 0.4 Width: (cm) 0.2 Depth: (cm) 0.3 Area: (cm) 0.063 Volume: (cm) 0.019 % Reduction in Area: 0% % Reduction in Volume: 0% Epithelialization: None Tunneling: No Undermining: No Wound Description Classification: Grade 2 Wound Margin: Well defined, not attached Exudate Amount: Medium Exudate Type: Serosanguineous Exudate Color: red, brown Foul Odor After Cleansing: No Slough/Fibrino No Wound Bed Granulation Amount: Large (67-100%) Exposed Structure Granulation Quality: Red Fascia Exposed: No Necrotic Amount: None Present (0%) Fat Layer (Subcutaneous Tissue) Exposed: Yes Tendon Exposed: No Muscle Exposed: No Joint Exposed: No Bone Exposed: No Treatment Notes Wound #4 (Metatarsal head first) Wound Laterality: Right Cleanser Byram Ancillary Kit - 15 Day Supply Discharge Instruction: Use supplies as instructed; Kit contains: (15) Saline Bullets; (15) 3x3 Gauze; 15 pr Gloves Peri-Wound Care Topical Primary Dressing KerraCel Ag Gelling Fiber Dressing, 2x2 in (silver alginate) Discharge Instruction: Apply silver alginate to wound bed as instructed Secondary Dressing Woven Gauze Sponges 2x2 in Discharge  Instruction: Apply over primary dressing as directed. Felt 2.5 yds x 5.5 in Discharge Instruction: Apply over primary dressing as directed. Secured With Conforming Stretch Gauze Bandage, Sterile 2x75 (in/in) Discharge Instruction: Secure with stretch gauze as directed. Paper Tape, 1x10 (in/yd) Discharge Instruction: Secure dressing with tape as directed. Compression Wrap Compression Stockings Add-Ons Electronic Signature(s) Signed: 07/11/2020 4:58:23 PM By: Sandre Kitty Signed: 07/11/2020 5:37:05 PM By: Levan Hurst RN, BSN Entered By: Sandre Kitty on 07/11/2020 16:28:49 -------------------------------------------------------------------------------- Vitals Details Patient Name: Date of Service: [name]. 07/11/2020 10:30 A M Medical Record Number: [id number] Patient Account Number: [id number] Date of Birth/Sex: Treating RN: May 13, 1950 (70 y.o. [name] Primary Care Taima Rada: Allyn Kenner Other Clinician: Referring [place]: Treating Nochum Fenter/Extender: Dierdre Harness in Treatment: 0 Vital Signs Time Taken: 10:57 Temperature (F): 98.4 Height (in): 70 Pulse (bpm): 74 Source: Stated Respiratory Rate (breaths/min): 16 Weight (lbs): 225 Blood Pressure (mmHg): 137/83 Source: Stated Capillary Blood Glucose (mg/dl): 167 Body Mass Index (BMI): 32.3 Reference Range: 80 - 120 mg / dl Notes glucose per pt report Electronic Signature(s) Signed: 07/11/2020 5:37:05 PM By: Levan Hurst RN, BSN Signed: 07/11/2020 5:37:05 PM By: Levan Hurst RN, BSN Entered By: Levan Hurst on 07/11/2020 10:58:24

## 2020-07-11 NOTE — Progress Notes (Addendum)
[name] ([id number]) Visit Report for 07/11/2020 Chief Complaint Document Details Patient Name: Date of Service: [name], [name]. 07/11/2020 10:30 A M Medical Record Number: [id number] Patient Account Number: [id number] Date of Birth/Sex: Treating RN: [birth year] (70 y.o. Janyth Contes Primary Care Jillayne Witte: Allyn Kenner Other Clinician: Referring Evelina Lore: Treating Karra Pink/Extender: Dierdre Harness in Treatment: 0 Information Obtained from: Patient Chief Complaint the patient arrives for evaluation of of a wound on his first right metatarsal head, plantar aspect Electronic Signature(s) Signed: 07/11/2020 5:18:30 PM By: Kalman Shan DO Entered By: Kalman Shan on 07/11/2020 17:02:16 -------------------------------------------------------------------------------- Debridement Details Patient Name: Date of Service: [name]. 07/11/2020 10:30 A M Medical Record Number: [id number] Patient Account Number: [id number] Date of Birth/Sex: Treating RN: Jun 08, 1950 (70 y.o. Janyth Contes Primary Care Symiah Nowotny: Allyn Kenner Other Clinician: Referring Betrice Wanat: Treating Khalilah Hoke/Extender: Dierdre Harness in Treatment: 0 Debridement Performed for Assessment: Wound #4 Right Metatarsal head first Performed By: Physician Kalman Shan, DO Debridement Type: Debridement Severity of Tissue Pre Debridement: Fat layer exposed Level of Consciousness (Pre-procedure): Awake and Alert Pre-procedure Verification/Time Out Yes - 11:45 Taken: Start Time: 11:45 Pain Control: Lidocaine 5% topical ointment T Area Debrided (L x W): otal 0.4 (cm) x 0.2 (cm) = 0.08 (cm) Tissue and other material debrided: Viable, Non-Viable, Callus, Subcutaneous Level: Skin/Subcutaneous Tissue Debridement Description: Excisional Instrument: Curette Bleeding: Minimum Hemostasis Achieved: Pressure End Time: 11:47 Procedural Pain: 0 Post Procedural Pain:  0 Response to Treatment: Procedure was tolerated well Level of Consciousness (Post- Awake and Alert procedure): Post Debridement Measurements of Total Wound Length: (cm) 0.4 Width: (cm) 0.2 Depth: (cm) 0.3 Volume: (cm) 0.019 Character of Wound/Ulcer Post Debridement: Improved Severity of Tissue Post Debridement: Fat layer exposed Post Procedure Diagnosis Same as Pre-procedure Electronic Signature(s) Signed: 07/11/2020 5:18:30 PM By: Kalman Shan DO Signed: 07/11/2020 5:37:05 PM By: Levan Hurst RN, BSN Entered By: Levan Hurst on 07/11/2020 11:46:19 -------------------------------------------------------------------------------- HPI Details Patient Name: Date of Service: [name]. 07/11/2020 10:30 A M Medical Record Number: [id number] Patient Account Number: [id number] Date of Birth/Sex: Treating RN: May 25, 1950 (70 y.o. Janyth Contes Primary Care Vittorio Mohs: Allyn Kenner Other Clinician: Referring Georgio Hattabaugh: Treating Mearle Drew/Extender: Dierdre Harness in Treatment: 0 History of Present Illness HPI Description: 07/12/19 [name] is a 70 year old male with a past medical history of insulin-dependent type 2 diabetes, essential hypertension and coronary artery disease status post CABG in 2009. He has been seen in our clinic on three separate occasions for diabetic foot wounds. Last seen in 2018 for a left fifth metatarsal head diabetic foot ulcer. This was treated with silver alginate and offloading. Other previous visits were in 2009 and 2015. Patient states that 2 months ago he noticed a blister to the bottom of his foot. He reports using a foot pedal and applying constant pressure to this area for 3 hours a day. 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 He has noticed minimal drainage to the wound bed and denies any pain, swelling or erythema to the right foot. 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 In the past 2 months he was seen at [facility] in [place], [place] and [place] [facility] for this issue. He states [employer] obtained an x-ray recently and reports it showed no significant  findings. He was started on a cream for which he can not remember the name. He states he uses this with daily dressing changes. He was not satisfied with his care at either location and has since decided to follow-up with [place]. Overall he feels well Electronic Signature(s) Signed: 07/11/2020 5:18:30 PM By: Kalman Shan DO Entered By: Kalman Shan on 07/11/2020 17:06:04 -------------------------------------------------------------------------------- Physical Exam Details Patient Name: Date of Service: [name]. 07/11/2020 10:30 A M Medical Record Number: [id number] Patient Account Number: [id number] Date of Birth/Sex: Treating RN: [birth year] (70 y.o. Janyth Contes Primary Care Rahn Lacuesta: Allyn Kenner Other Clinician: Referring Dayja Loveridge: Treating Daeshawn Redmann/Extender: Dierdre Harness in Treatment: 0 Constitutional respirations regular, non-labored and within target range for patient.. Well-nourished and well-hydrated in no acute distress. Cardiovascular 2+ dorsalis pedis/posterior tibialis pulses. Notes Right first metatarsal head on the plantar aspect with callus and small open wound with minimal undermining. No drainage noted with palpation. No swelling erythema or increased warmth to the right foot. He denies sensation to the wound area. Electronic Signature(s) Signed: 07/11/2020 5:18:30 PM By: Kalman Shan DO Entered By: Kalman Shan on 07/11/2020 17:03:44 -------------------------------------------------------------------------------- Physician Orders Details Patient Name: Date of Service: [name]. 07/11/2020 10:30 A M Medical Record  Number: [id number] Patient Account Number: [id number] Date of Birth/Sex: Treating RN: [birth year] (70 y.o. Janyth Contes Primary Care Curry Seefeldt: Allyn Kenner Other Clinician: Referring Trevin Gartrell: Treating Anginette Espejo/Extender: Dierdre Harness in Treatment: 0 Verbal / Phone Orders: No Diagnosis Coding ICD-10 Coding Code Description E11.621 Type 2 diabetes mellitus with foot ulcer E11.40 Type 2 diabetes mellitus with diabetic neuropathy, unspecified I25.10 Atherosclerotic heart disease of native coronary artery without angina pectoris I10 Essential (primary) hypertension Follow-up Appointments ppointment in 1 week. - with Dr. Heber [place] Return A Bathing/ Shower/ Hygiene May shower and wash wound with soap and water. - on days that dressing is changed Off-Loading Open toe surgical shoe to: - right foot with felt Wound Treatment Wound #4 - Metatarsal head first Wound Laterality: Right Cleanser: Byram Ancillary Kit - 15 Day Supply (DME) (Generic) Every Other Day/15 Days Discharge Instructions: Use supplies as instructed; Kit contains: (15) Saline Bullets; (15) 3x3 Gauze; 15 pr Gloves Prim Dressing: KerraCel Ag Gelling Fiber Dressing, 2x2 in (silver alginate) (DME) (Generic) Every Other Day/15 Days ary Discharge Instructions: Apply silver alginate to wound bed as instructed Secondary Dressing: Woven Gauze Sponges 2x2 in (DME) (Generic) Every Other Day/15 Days Discharge Instructions: Apply over primary dressing as directed. Secondary Dressing: Felt 2.5 yds x 5.5 in Every Other Day/15 Days Discharge Instructions: Apply over primary dressing as directed. Secured With: Child psychotherapist, Sterile 2x75 (in/in) (DME) (Generic) Every Other Day/15 Days Discharge Instructions: Secure with stretch gauze as directed. Secured With: Paper Tape, 1x10 (in/yd) (DME) (Generic) Every Other Day/15 Days Discharge Instructions: Secure dressing with tape as directed. Services and  Therapies rterial Studies- Bilateral w/ABIs and TBIs - Non healing wound on right first metatarsal head - (ICD10 E11.621 - Type 2 diabetes mellitus with foot A ulcer) Electronic Signature(s) Signed: 07/11/2020 5:18:30 PM By: Kalman Shan DO Entered By: Kalman Shan on 07/11/2020 17:04:00 -------------------------------------------------------------------------------- Problem List Details Patient Name: Date of Service: [name]. 07/11/2020 10:30 A M Medical Record Number: [id number] Patient Account Number: [id number] Date of Birth/Sex: Treating RN: [birth year] (70 y.o. Janyth Contes Primary Care Gurpreet Mikhail: Allyn Kenner Other Clinician: Referring Layaan Mott: Treating Aaronjames Kelsay/Extender: Dierdre Harness in Treatment: 0 Active Problems ICD-10 Encounter  Code Description Active Date MDM Diagnosis E11.621 Type 2 diabetes mellitus with foot ulcer 07/11/2020 No Yes E11.40 Type 2 diabetes mellitus with diabetic neuropathy, unspecified 07/11/2020 No Yes I25.10 Atherosclerotic heart disease of native coronary artery without angina pectoris 07/11/2020 No Yes I10 Essential (primary) hypertension 07/11/2020 No Yes Inactive Problems Resolved Problems Electronic Signature(s) Signed: 07/11/2020 5:18:30 PM By: Kalman Shan DO Entered By: Kalman Shan on 07/11/2020 17:01:43 -------------------------------------------------------------------------------- Progress Note Details Patient Name: Date of Service: [name]. 07/11/2020 10:30 A M Medical Record Number: [id number] Patient Account Number: [id number] Date of Birth/Sex: Treating RN: [birth year] (70 y.o. Janyth Contes Primary Care Marquel Spoto: Allyn Kenner Other Clinician: Referring Kassaundra Hair: Treating Anitria Andon/Extender: Dierdre Harness in Treatment: 0 Subjective Chief Complaint Information obtained from Patient the patient arrives for evaluation of of a wound on his first right metatarsal  head, plantar aspect History of Present Illness (HPI) 07/12/19 [name] is a 70 year old male with a past medical history of insulin-dependent type 2 diabetes, essential hypertension and coronary artery disease status post CABG in 2009. He has been seen in our clinic on three separate occasions for diabetic foot wounds. Last seen in 2018 for a left fifth metatarsal head diabetic foot ulcer. This was treated with silver alginate and offloading. Other previous visits were in 2009 and 2015. Patient states that 2 months ago he noticed a blister to the bottom of his foot. He reports using a foot pedal and applying constant pressure to this area for 3 hours a day. 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 He has noticed minimal drainage to the wound bed and denies any pain, swelling or erythema to the right foot. 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 In the past 2 months he was seen at [facility] in [place], [place] and [facility] for this issue. He states [employer] obtained an x-ray recently and reports it showed no significant findings. He was started on a cream for which he can not remember the name. He states he uses this with daily dressing changes. He was not satisfied with his care at either location and has since decided to follow-up with [place]. Overall he feels well Patient History Information obtained from Patient. Allergies penicillin (Reaction: rash) Family History Cancer - Mother, Diabetes - Father,Siblings, Heart Disease - Father,Siblings, Hypertension - Mother,Father,Siblings, No family history of Hereditary Spherocytosis, Kidney Disease, Lung Disease, Seizures, Stroke, Thyroid Problems, Tuberculosis. Social History Never smoker, Marital Status - Married, Alcohol Use - Never, Drug Use - No History, Caffeine Use  - Daily - coffee. Medical History Eyes Patient has history of Cataracts - removed Denies history of Glaucoma, Optic Neuritis Ear/Nose/Mouth/Throat Denies history of Chronic sinus problems/congestion, Middle ear problems Hematologic/Lymphatic Denies history of Anemia, Hemophilia, Human Immunodeficiency Virus, Lymphedema, Sickle Cell Disease Respiratory Patient has history of Sleep Apnea Denies history of Aspiration, Asthma, Chronic Obstructive Pulmonary Disease (COPD), Pneumothorax, Tuberculosis Cardiovascular Patient has history of Coronary Artery Disease - CABG x5, Hypertension, Peripheral Arterial Disease Denies history of Angina, Arrhythmia, Congestive Heart Failure, Deep Vein Thrombosis, Hypotension, Myocardial Infarction, Peripheral Venous Disease, Phlebitis, Vasculitis Gastrointestinal Denies history of Cirrhosis , Colitis, Crohnoos, Hepatitis A, Hepatitis B, Hepatitis C Endocrine Patient has history of Type II Diabetes Denies history of Type I Diabetes Genitourinary Denies history of End Stage Renal Disease Immunological  Denies history of Lupus Erythematosus, Raynaudoos, Scleroderma Integumentary (Skin) Denies history of History of Burn Musculoskeletal Denies history of Gout, Rheumatoid Arthritis, Osteoarthritis, Osteomyelitis Neurologic Patient has history of Neuropathy Denies history of Dementia, Quadriplegia, Paraplegia, Seizure Disorder Oncologic Denies history of Received Chemotherapy, Received Radiation Psychiatric Denies history of Anorexia/bulimia, Confinement Anxiety Patient is treated with Insulin, Oral Agents. Blood sugar is tested. Blood sugar results noted at the following times: Breakfast - 160. Hospitalization/Surgery History - coronoary artery bypass. - cardiac cath. - back surgery. - appendectomy. - tonsillectomy. Medical A Surgical History Notes nd Cardiovascular CVA, hyperlipidemia, carotid stenosis Gastrointestinal colon  polyps Genitourinary Renal Insufficiency Integumentary (Skin) (L) foot ulcer - Review of Systems (ROS) Constitutional Symptoms (General Health) Denies complaints or symptoms of Fatigue, Fever, Chills, Marked Weight Change. Ear/Nose/Mouth/Throat Denies complaints or symptoms of Chronic sinus problems or rhinitis. Gastrointestinal Denies complaints or symptoms of Frequent diarrhea, Nausea, Vomiting. Integumentary (Skin) Complains or has symptoms of Wounds - wound on right foot. Musculoskeletal Denies complaints or symptoms of Muscle Pain, Muscle Weakness. Psychiatric Denies complaints or symptoms of Claustrophobia, Suicidal. Objective Constitutional respirations regular, non-labored and within target range for patient.. Well-nourished and well-hydrated in no acute distress. Vitals Time Taken: 10:57 AM, Height: 70 in, Source: Stated, Weight: 225 lbs, Source: Stated, BMI: 32.3, Temperature: 98.4 F, Pulse: 74 bpm, Respiratory Rate: 16 breaths/min, Blood Pressure: 137/83 mmHg, Capillary Blood Glucose: 167 mg/dl. General Notes: glucose per pt report Cardiovascular 2+ dorsalis pedis/posterior tibialis pulses. General Notes: Right first metatarsal head on the plantar aspect with callus and small open wound with minimal undermining. No drainage noted with palpation. No swelling erythema or increased warmth to the right foot. He denies sensation to the wound area. Integumentary (Hair, Skin) Wound #4 status is Open. Original cause of wound was Blister. The date acquired was: 05/07/2020. The wound is located on the Right Metatarsal head first. The wound measures 0.4cm length x 0.2cm width x 0.3cm depth; 0.063cm^2 area and 0.019cm^3 volume. There is Fat Layer (Subcutaneous Tissue) exposed. There is no tunneling or undermining noted. There is a medium amount of serosanguineous drainage noted. The wound margin is well defined and not attached to the wound base. There is large (67-100%) red  granulation within the wound bed. There is no necrotic tissue within the wound bed. Assessment Active Problems ICD-10 Type 2 diabetes mellitus with foot ulcer Type 2 diabetes mellitus with diabetic neuropathy, unspecified Atherosclerotic heart disease of native coronary artery without angina pectoris Essential (primary) hypertension Assessment: Type 2 diabetes complicated by neuropathy with foot ulcer Patient presents with a 68-monthhistory of nonhealing right first metatarsal ulcer on the plantar aspect due to pressure. Patient had ABIs done on 09/2017 that showed a right posterior tibial of 1.01 and a left dorsalis pedis of 1.38. Today ABIs were non compressible. He did have 2+ pedal pulses on right. HgbA1C - 7.9, uncontrolled on ozempic and novolog Procedures Wound #4 Pre-procedure diagnosis of Wound #4 is a Diabetic Wound/Ulcer of the Lower Extremity located on the Right Metatarsal head first .Severity of Tissue Pre Debridement is: Fat layer exposed. There was a Excisional Skin/Subcutaneous Tissue Debridement with a total area of 0.08 sq cm performed by HKalman Shan DO. With the following instrument(s): Curette to remove Viable and Non-Viable tissue/material. Material removed includes Callus and Subcutaneous Tissue and after achieving pain control using Lidocaine 5% topical ointment. No specimens were taken. A time out was conducted at 11:45, prior to the start of the procedure. A Minimum amount of bleeding  was controlled with Pressure. The procedure was tolerated well with a pain level of 0 throughout and a pain level of 0 following the procedure. Post Debridement Measurements: 0.4cm length x 0.2cm width x 0.3cm depth; 0.019cm^3 volume. Character of Wound/Ulcer Post Debridement is improved. Severity of Tissue Post Debridement is: Fat layer exposed. Post procedure Diagnosis Wound #4: Same as Pre-Procedure Plan Follow-up Appointments: Return Appointment in 1 week. - with Dr.  Heber [place] Bathing/ Shower/ Hygiene: May shower and wash wound with soap and water. - on days that dressing is changed Off-Loading: Open toe surgical shoe to: - right foot with felt Services and Therapies ordered were: Arterial Studies- Bilateral w/ABIs and TBIs - Non healing wound on right first metatarsal head WOUND #4: - Metatarsal head first Wound Laterality: Right Cleanser: Byram Ancillary Kit - 15 Day Supply (DME) (Generic) Every Other Day/15 Days Discharge Instructions: Use supplies as instructed; Kit contains: (15) Saline Bullets; (15) 3x3 Gauze; 15 pr Gloves Prim Dressing: KerraCel Ag Gelling Fiber Dressing, 2x2 in (silver alginate) (DME) (Generic) Every Other Day/15 Days ary Discharge Instructions: Apply silver alginate to wound bed as instructed Secondary Dressing: Woven Gauze Sponges 2x2 in (DME) (Generic) Every Other Day/15 Days Discharge Instructions: Apply over primary dressing as directed. Secondary Dressing: Felt 2.5 yds x 5.5 in Every Other Day/15 Days Discharge Instructions: Apply over primary dressing as directed. Secured With: Child psychotherapist, Sterile 2x75 (in/in) (DME) (Generic) Every Other Day/15 Days Discharge Instructions: Secure with stretch gauze as directed. Secured With: Paper Tape, 1x10 (in/yd) (DME) (Generic) Every Other Day/15 Days Discharge Instructions: Secure dressing with tape as directed. Plan At this time I would like to obtain formal ABIs as ours showed his lower extremity arteries to be noncompressible. He had 2+ pedal pulse on the right and thought it was reasonable to go ahead and debride the callus. We discussed proper footwear and offloading in order to heal the wound. He was given a surgical shoe with an offloading felt pad from our office. Will use silver alginate every other day with dressing changes. We will obtain the x-ray results done at [facility]. There are no signs of infection today and do not think it is  necessary to repeat an x-ray or give antibiotics. Will have patient follow-up in 1 week. 1) ABIs ordered 2) silver alginate every other day 3) surgical shoe with offloading felt pad 4) obtain x-ray results 5) follow up in one week Electronic Signature(s) Signed: 07/11/2020 5:18:30 PM By: Kalman Shan DO Entered By: Kalman Shan on 07/11/2020 17:09:54 -------------------------------------------------------------------------------- HxROS Details Patient Name: Date of Service: Marline Backbone W. 07/11/2020 10:30 A M Medical Record Number: [id number] Patient Account Number: [id number] Date of Birth/Sex: Treating RN: 06/25/1950 (70 y.o. Janyth Contes Primary Care Orel Cooler: Allyn Kenner Other Clinician: Referring Khoa Opdahl: Treating Ariyel Jeangilles/Extender: Dierdre Harness in Treatment: 0 Information Obtained From Patient Constitutional Symptoms (General Health) Complaints and Symptoms: Negative for: Fatigue; Fever; Chills; Marked Weight Change Ear/Nose/Mouth/Throat Complaints and Symptoms: Negative for: Chronic sinus problems or rhinitis Medical History: Negative for: Chronic sinus problems/congestion; Middle ear problems Gastrointestinal Complaints and Symptoms: Negative for: Frequent diarrhea; Nausea; Vomiting Medical History: Negative for: Cirrhosis ; Colitis; Crohns; Hepatitis A; Hepatitis B; Hepatitis C Past Medical History Notes: colon polyps Integumentary (Skin) Complaints and Symptoms: Positive for: Wounds - wound on right foot Medical History: Negative for: History of Burn Past Medical History Notes: (L) foot ulcer - Musculoskeletal Complaints and Symptoms: Negative for: Muscle Pain; Muscle Weakness Medical History:  Negative for: Gout; Rheumatoid Arthritis; Osteoarthritis; Osteomyelitis Psychiatric Complaints and Symptoms: Negative for: Claustrophobia; Suicidal Medical History: Negative for: Anorexia/bulimia; Confinement  Anxiety Eyes Medical History: Positive for: Cataracts - removed Negative for: Glaucoma; Optic Neuritis Hematologic/Lymphatic Medical History: Negative for: Anemia; Hemophilia; Human Immunodeficiency Virus; Lymphedema; Sickle Cell Disease Respiratory Medical History: Positive for: Sleep Apnea Negative for: Aspiration; Asthma; Chronic Obstructive Pulmonary Disease (COPD); Pneumothorax; Tuberculosis Cardiovascular Medical History: Positive for: Coronary Artery Disease - CABG x5; Hypertension; Peripheral Arterial Disease Negative for: Angina; Arrhythmia; Congestive Heart Failure; Deep Vein Thrombosis; Hypotension; Myocardial Infarction; Peripheral Venous Disease; Phlebitis; Vasculitis Past Medical History Notes: CVA, hyperlipidemia, carotid stenosis Endocrine Medical History: Positive for: Type II Diabetes Negative for: Type I Diabetes Time with diabetes: over 10 years Treated with: Insulin, Oral agents Blood sugar tested every day: Yes Tested : 2-3 times a day Blood sugar testing results: Breakfast: 160 Genitourinary Medical History: Negative for: End Stage Renal Disease Past Medical History Notes: Renal Insufficiency Immunological Medical History: Negative for: Lupus Erythematosus; Raynauds; Scleroderma Neurologic Medical History: Positive for: Neuropathy Negative for: Dementia; Quadriplegia; Paraplegia; Seizure Disorder Oncologic Medical History: Negative for: Received Chemotherapy; Received Radiation HBO Extended History Items Eyes: Cataracts Immunizations Pneumococcal Vaccine: Received Pneumococcal Vaccination: No Implantable Devices No devices added Hospitalization / Surgery History Type of Hospitalization/Surgery coronoary artery bypass cardiac cath back surgery appendectomy tonsillectomy Family and Social History Cancer: Yes - Mother; Diabetes: Yes - Father,Siblings; Heart Disease: Yes - Father,Siblings; Hereditary Spherocytosis: No; Hypertension: Yes  - Mother,Father,Siblings; Kidney Disease: No; Lung Disease: No; Seizures: No; Stroke: No; Thyroid Problems: No; Tuberculosis: No; Never smoker; Marital Status - Married; Alcohol Use: Never; Drug Use: No History; Caffeine Use: Daily - coffee; Financial Concerns: No; Food, Clothing or Shelter Needs: No; Support System Lacking: No; Transportation Concerns: No Electronic Signature(s) Signed: 07/11/2020 5:18:30 PM By: Kalman Shan DO Signed: 07/11/2020 5:37:05 PM By: Levan Hurst RN, BSN Entered By: Levan Hurst on 07/11/2020 11:18:24 -------------------------------------------------------------------------------- SuperBill Details Patient Name: Date of Service: [name]. 07/11/2020 Medical Record Number: [id number] Patient Account Number: [id number] Date of Birth/Sex: Treating RN: 03-20-1951 (70 y.o. Janyth Contes Primary Care Caydon Feasel: Allyn Kenner Other Clinician: Referring Breyonna Nault: Treating Nicolo Tomko/Extender: Dierdre Harness in Treatment: 0 Diagnosis Coding ICD-10 Codes Code Description [phone] Type 2 diabetes mellitus with foot ulcer E11.40 Type 2 diabetes mellitus with diabetic neuropathy, unspecified I25.10 Atherosclerotic heart disease of native coronary artery without angina pectoris I10 Essential (primary) hypertension Facility Procedures CPT4 Code: 15056979 Description: 99213 - WOUND CARE VISIT-LEV 3 EST PT Modifier: 25 Quantity: 1 Electronic Signature(s) Signed: 07/12/2020 11:29:04 AM By: Kalman Shan DO Previous Signature: 07/11/2020 5:18:30 PM Version By: Kalman Shan DO Entered By: Kalman Shan on 07/12/2020 11:25:18

## 2020-07-11 NOTE — Progress Notes (Signed)
JAYVION, STEFANSKI ([id number]) Visit Report for 07/11/2020 Abuse/Suicide Risk Screen Details Patient Name: Date of Service: DANIELLE, LENTO. 07/11/2020 10:30 A M Medical Record Number: [id number] Patient Account Number: [id number] Date of Birth/Sex: Treating RN: 07/22/1950 (70 y.o. Elizebeth Koller Primary Care Haseeb Fiallos: Nita Sells Other Clinician: Referring Demya Scruggs: Treating Daishon Chui/Extender: Tomasita Crumble in Treatment: 0 Abuse/Suicide Risk Screen Items Answer ABUSE RISK SCREEN: Has anyone close to you tried to hurt or harm you recentlyo No Do you feel uncomfortable with anyone in your familyo No Has anyone forced you do things that you didnt want to doo No Electronic Signature(s) Signed: 07/11/2020 5:37:05 PM By: Zandra Abts RN, BSN Entered By: Zandra Abts on 07/11/2020 11:18:29 -------------------------------------------------------------------------------- Activities of Daily Living Details Patient Name: Date of Service: RAMADAN, COUEY. 07/11/2020 10:30 A M Medical Record Number: [id number] Patient Account Number: [id number] Date of Birth/Sex: Treating RN: Apr 22, 1950 (70 y.o. Elizebeth Koller Primary Care Ladora Osterberg: Nita Sells Other Clinician: Referring Janyth Riera: Treating Geovannie Vilar/Extender: Tomasita Crumble in Treatment: 0 Activities of Daily Living Items Answer Activities of Daily Living (Please select one for each item) Drive Automobile Completely Able T Medications ake Completely Able Use T elephone Completely Able Care for Appearance Completely Able Use T oilet Completely Able Bath / Shower Completely Able Dress Self Completely Able Feed Self Completely Able Walk Completely Able Get In / Out Bed Completely Able Housework Completely Able Prepare Meals Completely Able Handle Money Completely Able Shop for Self Completely Able Electronic Signature(s) Signed: 07/11/2020 5:37:05 PM By: Zandra Abts RN, BSN Entered By:  Zandra Abts on 07/11/2020 11:19:04 -------------------------------------------------------------------------------- Education Screening Details Patient Name: Date of Service: [name]. 07/11/2020 10:30 A M Medical Record Number: [id number] Patient Account Number: [id number] Date of Birth/Sex: Treating RN: 10-25-1950 (70 y.o. Elizebeth Koller Primary Care Mikaela Hilgeman: Nita Sells Other Clinician: Referring Brevyn Ring: Treating Shavar Gorka/Extender: Tomasita Crumble in Treatment: 0 Primary Learner Assessed: Patient Learning Preferences/Education Level/Primary Language Learning Preference: Explanation Highest Education Level: High School Preferred Language: English Cognitive Barrier Language Barrier: No Translator Needed: No Memory Deficit: No Emotional Barrier: No Cultural/Religious Beliefs Affecting Medical Care: No Physical Barrier Impaired Vision: No Impaired Hearing: No Decreased Hand dexterity: No Knowledge/Comprehension Knowledge Level: High Comprehension Level: High Ability to understand written instructions: High Ability to understand verbal instructions: High Motivation Anxiety Level: Calm Cooperation: Cooperative Education Importance: Acknowledges Need Interest in Health Problems: Asks Questions Perception: Coherent Willingness to Engage in Self-Management High Activities: Readiness to Engage in Self-Management High Activities: Electronic Signature(s) Signed: 07/11/2020 5:37:05 PM By: Zandra Abts RN, BSN Entered By: Zandra Abts on 07/11/2020 11:19:41 -------------------------------------------------------------------------------- Fall Risk Assessment Details Patient Name: Date of Service: [place] W. 07/11/2020 10:30 A M Medical Record Number: [id number] Patient Account Number: [id number] Date of Birth/Sex: Treating RN: 1950-11-19 (70 y.o. Elizebeth Koller Primary Care Aanika Defoor: Nita Sells Other Clinician: Referring  Kate Sweetman: Treating Donna Snooks/Extender: Tomasita Crumble in Treatment: 0 Fall Risk Assessment Items Have you had 2 or more falls in the last 12 monthso 0 No Have you had any fall that resulted in injury in the last 12 monthso 0 No FALLS RISK SCREEN History of falling - immediate or within 3 months 0 No Secondary diagnosis (Do you have 2 or more medical diagnoseso) 15 Yes Ambulatory aid None/bed rest/wheelchair/nurse 0 Yes Crutches/cane/walker 0 No Furniture 0 No Intravenous therapy Access/Saline/Heparin Lock 0 No Gait/Transferring Normal/ bed rest/ wheelchair 0 Yes Weak (short steps  with or without shuffle, stooped but able to lift head while walking, may seek 0 No support from furniture) Impaired (short steps with shuffle, may have difficulty arising from chair, head down, impaired 0 No balance) Mental Status Oriented to own ability 0 Yes Electronic Signature(s) Signed: 07/11/2020 5:37:05 PM By: Zandra Abts RN, BSN Entered By: Zandra Abts on 07/11/2020 11:20:03 -------------------------------------------------------------------------------- Foot Assessment Details Patient Name: Date of Service: [name]. 07/11/2020 10:30 A M Medical Record Number: [id number] Patient Account Number: [id number] Date of Birth/Sex: Treating RN: 1951-01-26 (70 y.o. Elizebeth Koller Primary Care Myking Sar: Nita Sells Other Clinician: Referring Skyy Mcknight: Treating Allena Pietila/Extender: Tomasita Crumble in Treatment: 0 Foot Assessment Items Site Locations + = Sensation present, - = Sensation absent, C = Callus, U = Ulcer R = Redness, W = Warmth, M = Maceration, PU = Pre-ulcerative lesion F = Fissure, S = Swelling, D = Dryness Assessment Right: Left: Other Deformity: No No Prior Foot Ulcer: No No Prior Amputation: No No Charcot Joint: No No Ambulatory Status: Ambulatory Without Help Gait: Steady Electronic Signature(s) Signed: 07/11/2020 5:37:05 PM  By: Zandra Abts RN, BSN Entered By: Zandra Abts on 07/11/2020 11:21:22 -------------------------------------------------------------------------------- Nutrition Risk Screening Details Patient Name: Date of Service: [name]. 07/11/2020 10:30 A M Medical Record Number: [id number] Patient Account Number: [id number] Date of Birth/Sex: Treating RN: [birth year] (70 y.o. Elizebeth Koller Primary Care Dalanie Kisner: Nita Sells Other Clinician: Referring Charlisha Market: Treating Mychal Durio/Extender: Tomasita Crumble in Treatment: 0 Height (in): 70 Weight (lbs): 225 Body Mass Index (BMI): 32.3 Nutrition Risk Screening Items Score Screening NUTRITION RISK SCREEN: I have an illness or condition that made me change the kind and/or amount of food I eat 2 Yes I eat fewer than two meals per day 0 No I eat few fruits and vegetables, or milk products 0 No I have three or more drinks of beer, liquor or wine almost every day 0 No I have tooth or mouth problems that make it hard for me to eat 0 No I don't always have enough money to buy the food I need 0 No I eat alone most of the time 0 No I take three or more different prescribed or over-the-counter drugs a day 1 Yes Without wanting to, I have lost or gained 10 pounds in the last six months 0 No I am not always physically able to shop, cook and/or feed myself 0 No Nutrition Protocols Good Risk Protocol Moderate Risk Protocol 0 Provide education on nutrition High Risk Proctocol Risk Level: Moderate Risk Score: 3 Electronic Signature(s) Signed: 07/11/2020 5:37:05 PM By: Zandra Abts RN, BSN Entered By: Zandra Abts on 07/11/2020 11:20:12

## 2020-07-12 DIAGNOSIS — I10 Essential (primary) hypertension: Secondary | ICD-10-CM | POA: Diagnosis not present

## 2020-07-12 DIAGNOSIS — E1165 Type 2 diabetes mellitus with hyperglycemia: Secondary | ICD-10-CM | POA: Diagnosis not present

## 2020-07-12 DIAGNOSIS — E11621 Type 2 diabetes mellitus with foot ulcer: Secondary | ICD-10-CM | POA: Diagnosis not present

## 2020-07-12 DIAGNOSIS — E1140 Type 2 diabetes mellitus with diabetic neuropathy, unspecified: Secondary | ICD-10-CM | POA: Diagnosis not present

## 2020-07-12 DIAGNOSIS — I2510 Atherosclerotic heart disease of native coronary artery without angina pectoris: Secondary | ICD-10-CM | POA: Diagnosis not present

## 2020-07-18 ENCOUNTER — Encounter (HOSPITAL_BASED_OUTPATIENT_CLINIC_OR_DEPARTMENT_OTHER): Payer: Medicare HMO | Admitting: Internal Medicine

## 2020-07-18 ENCOUNTER — Other Ambulatory Visit: Payer: Self-pay

## 2020-07-18 DIAGNOSIS — I10 Essential (primary) hypertension: Secondary | ICD-10-CM | POA: Diagnosis not present

## 2020-07-18 DIAGNOSIS — E11621 Type 2 diabetes mellitus with foot ulcer: Secondary | ICD-10-CM

## 2020-07-18 DIAGNOSIS — L97529 Non-pressure chronic ulcer of other part of left foot with unspecified severity: Secondary | ICD-10-CM | POA: Diagnosis not present

## 2020-07-18 DIAGNOSIS — E1140 Type 2 diabetes mellitus with diabetic neuropathy, unspecified: Secondary | ICD-10-CM | POA: Diagnosis not present

## 2020-07-18 DIAGNOSIS — Z951 Presence of aortocoronary bypass graft: Secondary | ICD-10-CM | POA: Diagnosis not present

## 2020-07-18 DIAGNOSIS — I2510 Atherosclerotic heart disease of native coronary artery without angina pectoris: Secondary | ICD-10-CM | POA: Diagnosis not present

## 2020-07-18 NOTE — Progress Notes (Signed)
ANDREAS, SOBOLEWSKI ([id number]) Visit Report for 07/18/2020 Chief Complaint Document Details Patient Name: Date of Service: [name], [name]. 07/18/2020 2:45 PM Medical Record Number: [id number] Patient Account Number: [id number] Date of Birth/Sex: Treating RN: 1951/03/01 (70 y.o. [name], [name] Primary Care Armonie Mettler: Allyn Kenner Other Clinician: Referring Idabell Picking: Treating Khalia Gong/Extender: Dierdre Harness in Treatment: 1 Information Obtained from: Patient Chief Complaint the patient arrives for evaluation of of a wound on his first right metatarsal head, plantar aspect Electronic Signature(s) Signed: 07/18/2020 3:43:56 PM By: Kalman Shan DO Entered By: Kalman Shan on 07/18/2020 15:29:27 -------------------------------------------------------------------------------- Debridement Details Patient Name: Date of Service: [name]. 07/18/2020 2:45 PM Medical Record Number: [id number] Patient Account Number: [id number] Date of Birth/Sex: Treating RN: 15-Dec-1950 (70 y.o. [name], [name] Primary Care Katasha Riga: Allyn Kenner Other Clinician: Referring Okla Qazi: Treating Indiana Gamero/Extender: Dierdre Harness in Treatment: 1 Debridement Performed for Assessment: Wound #4 Right Metatarsal head first Performed By: Physician Kalman Shan, DO Debridement Type: Debridement Severity of Tissue Pre Debridement: Fat layer exposed Level of Consciousness (Pre-procedure): Awake and Alert Pre-procedure Verification/Time Out Yes - 15:24 Taken: Start Time: 15:24 Pain Control: Lidocaine T Area Debrided (L x W): otal 0.3 (cm) x 0.1 (cm) = 0.03 (cm) Tissue and other material debrided: Viable, Non-Viable, Subcutaneous, Skin: Dermis , Skin: Epidermis Level: Skin/Subcutaneous Tissue Debridement Description: Excisional Instrument: Curette Bleeding: Minimum Hemostasis Achieved: Pressure End Time: 15:25 Procedural Pain: 0 Post Procedural  Pain: 0 Response to Treatment: Procedure was tolerated well Level of Consciousness (Post- Awake and Alert procedure): Post Debridement Measurements of Total Wound Length: (cm) 0.3 Width: (cm) 0.1 Depth: (cm) 0.2 Volume: (cm) 0.005 Character of Wound/Ulcer Post Debridement: Improved Severity of Tissue Post Debridement: Fat layer exposed Post Procedure Diagnosis Same as Pre-procedure Electronic Signature(s) Signed: 07/18/2020 3:43:56 PM By: Kalman Shan DO Signed: 07/18/2020 5:45:48 PM By: Rhae Hammock RN Entered By: Rhae Hammock on 07/18/2020 15:24:51 -------------------------------------------------------------------------------- HPI Details Patient Name: Date of Service: [name]. 07/18/2020 2:45 PM Medical Record Number: [id number] Patient Account Number: [id number] Date of Birth/Sex: Treating RN: 01/08/1951 (70 y.o. Erie Noe Primary Care Zack Crager: Allyn Kenner Other Clinician: Referring Tenley Winward: Treating Morad Tal/Extender: Dierdre Harness in Treatment: 1 History of Present Illness HPI Description: 07/12/19 [name] is a 70 year old male with a past medical history of insulin-dependent type 2 diabetes, essential hypertension and coronary artery disease status post CABG in 2009. He has been seen in our clinic on three separate occasions for diabetic foot wounds. Last seen in 2018 for a left fifth metatarsal head diabetic foot ulcer. This was treated with silver alginate and offloading. Other previous visits were in 2009 and 2015. Patient states that 2 months ago he noticed a blister to the bottom of his foot. He reports using a foot pedal and applying constant pressure to this area for 3 hours a day. 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 He has noticed minimal drainage to the wound bed and denies any pain, swelling or erythema to the right  foot. 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 In the past 2 months he was seen at [facility] in [place], [place] and [place] orthopedics for this issue. He states [employer] obtained an x-ray recently and reports it showed no significant findings. He was  started on a cream for which he can not remember the name. He states he uses this with daily dressing changes. He was not satisfied with his care at either location and has since decided to follow-up with [place]. Overall he feels well 07/18/20 Patient presents for his 1 week follow-up. He reports no issues in the past week. He has been using his surgical shoe and has been aggressive with offloading. He is pleased with the progress of his wound healing. He denies any drainage, increased warmth or erythema to the foot. Electronic Signature(s) Signed: 07/18/2020 3:43:56 PM By: Kalman Shan DO Entered By: Kalman Shan on 07/18/2020 15:37:43 -------------------------------------------------------------------------------- Physical Exam Details Patient Name: Date of Service: [name]. 07/18/2020 2:45 PM Medical Record Number: [id number] Patient Account Number: [id number] Date of Birth/Sex: Treating RN: 1950/07/13 (70 y.o. Erie Noe Primary Care Terry Bolotin: Allyn Kenner Other Clinician: Referring Elberta Lachapelle: Treating Willam Munford/Extender: Dierdre Harness in Treatment: 1 Constitutional respirations regular, non-labored and within target range for patient.. Cardiovascular 2+ dorsalis pedis/posterior tibialis pulses. Notes Right first metatarsal: The plantar aspect has a small opening with no drainage and depth of 0.2cm. There is no callus present. There is no swelling or erythema noted Electronic Signature(s) Signed: 07/18/2020 3:43:56 PM By: Kalman Shan DO Entered By: Kalman Shan on 07/18/2020  15:35:30 -------------------------------------------------------------------------------- Physician Orders Details Patient Name: Date of Service: [name]. 07/18/2020 2:45 PM Medical Record Number: [id number] Patient Account Number: [id number] Date of Birth/Sex: Treating RN: [birth year] (70 y.o. [name], [name] Primary Care Arna Luis: Allyn Kenner Other Clinician: Referring Kennidi Yoshida: Treating Varsha Knock/Extender: Dierdre Harness in Treatment: 1 Verbal / Phone Orders: No Diagnosis Coding ICD-10 Coding Code Description E11.621 Type 2 diabetes mellitus with foot ulcer E11.40 Type 2 diabetes mellitus with diabetic neuropathy, unspecified I25.10 Atherosclerotic heart disease of native coronary artery without angina pectoris I10 Essential (primary) hypertension Follow-up Appointments ppointment in 1 week. - with Dr. Heber [place] Return A Bathing/ Shower/ Hygiene May shower and wash wound with soap and water. - on days that dressing is changed Off-Loading Open toe surgical shoe to: - right foot with felt Wound Treatment Wound #4 - Metatarsal head first Wound Laterality: Right Cleanser: Byram Ancillary Kit - 15 Day Supply (Generic) Every Other Day/15 Days Discharge Instructions: Use supplies as instructed; Kit contains: (15) Saline Bullets; (15) 3x3 Gauze; 15 pr Gloves Prim Dressing: KerraCel Ag Gelling Fiber Dressing, 2x2 in (silver alginate) (Generic) Every Other Day/15 Days ary Discharge Instructions: Apply silver alginate to wound bed as instructed Secondary Dressing: Woven Gauze Sponges 2x2 in (Generic) Every Other Day/15 Days Discharge Instructions: Apply over primary dressing as directed. Secondary Dressing: Felt 2.5 yds x 5.5 in Every Other Day/15 Days Discharge Instructions: Apply over primary dressing as directed. Secured With: Child psychotherapist, Sterile 2x75 (in/in) (Generic) Every Other Day/15 Days Discharge Instructions: Secure with  stretch gauze as directed. Secured With: Paper Tape, 1x10 (in/yd) (Generic) Every Other Day/15 Days Discharge Instructions: Secure dressing with tape as directed. Electronic Signature(s) Signed: 07/18/2020 3:43:56 PM By: Kalman Shan DO Entered By: Kalman Shan on 07/18/2020 15:35:57 -------------------------------------------------------------------------------- Problem List Details Patient Name: Date of Service: [name]. 07/18/2020 2:45 PM Medical Record Number: [id number] Patient Account Number: [id number] Date of Birth/Sex: Treating RN: 02-03-1951 (70 y.o. Erie Noe Primary Care Nathanie Ottley: Allyn Kenner Other Clinician: Referring Maureen Delatte: Treating Jerrett Baldinger/Extender: Dierdre Harness in Treatment: 1 Active Problems ICD-10 Encounter Code Description Active Date MDM Diagnosis E11.621 Type  2 diabetes mellitus with foot ulcer 07/11/2020 No Yes E11.40 Type 2 diabetes mellitus with diabetic neuropathy, unspecified 07/11/2020 No Yes I25.10 Atherosclerotic heart disease of native coronary artery without angina pectoris 07/11/2020 No Yes I10 Essential (primary) hypertension 07/11/2020 No Yes Inactive Problems Resolved Problems Electronic Signature(s) Signed: 07/18/2020 3:43:56 PM By: Kalman Shan DO Entered By: Kalman Shan on 07/18/2020 15:36:59 -------------------------------------------------------------------------------- Progress Note Details Patient Name: Date of Service: [name]. 07/18/2020 2:45 PM Medical Record Number: [id number] Patient Account Number: [id number] Date of Birth/Sex: Treating RN: [birth year] (70 y.o. [name], [name] Primary Care Biddie Sebek: Allyn Kenner Other Clinician: Referring Lakely Elmendorf: Treating Laylee Schooley/Extender: Dierdre Harness in Treatment: 1 Subjective Chief Complaint Information obtained from Patient the patient arrives for evaluation of of a wound on his first right metatarsal  head, plantar aspect History of Present Illness (HPI) 07/12/19 [name] is a 70 year old male with a past medical history of insulin-dependent type 2 diabetes, essential hypertension and coronary artery disease status post CABG in 2009. He has been seen in our clinic on three separate occasions for diabetic foot wounds. Last seen in 2018 for a left fifth metatarsal head diabetic foot ulcer. This was treated with silver alginate and offloading. Other previous visits were in 2009 and 2015. Patient states that 2 months ago he noticed a blister to the bottom of his foot. He reports using a foot pedal and applying constant pressure to this area for 3 hours a day. 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 He has noticed minimal drainage to the wound bed and denies any pain, swelling or erythema to the right foot. 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 In the past 2 months he was seen at [facility] in [place], [place] and [place] orthopedics for this issue. He states [employer] [facility] obtained an x-ray recently and reports it showed no significant findings. He was started on a cream for which he can not remember the name. He states he uses this with daily dressing changes. He was not satisfied with his care at either location and has since decided to follow-up with [place]. Overall he feels well 07/18/20 Patient presents for his 1 week follow-up. He reports no issues in the past week. He has been using his surgical shoe and has been aggressive with offloading. He is pleased with the progress of his wound healing. He denies any drainage, increased warmth or erythema to the foot. Patient History Information obtained from Patient. Family History Cancer - Mother, Diabetes - Father,Siblings, Heart Disease - Father,Siblings, Hypertension -  Mother,Father,Siblings, No family history of Hereditary Spherocytosis, Kidney Disease, Lung Disease, Seizures, Stroke, Thyroid Problems, Tuberculosis. Social History Never smoker, Marital Status - Married, Alcohol Use - Never, Drug Use - No History, Caffeine Use - Daily - coffee. Medical History Eyes Patient has history of Cataracts - removed Denies history of Glaucoma, Optic Neuritis Ear/Nose/Mouth/Throat Denies history of Chronic sinus problems/congestion, Middle ear problems Hematologic/Lymphatic Denies history of Anemia, Hemophilia, Human Immunodeficiency Virus, Lymphedema, Sickle Cell Disease Respiratory Patient has history of Sleep Apnea Denies history of Aspiration, Asthma, Chronic Obstructive Pulmonary Disease (COPD), Pneumothorax, Tuberculosis Cardiovascular Patient has history of Coronary Artery Disease - CABG x5, Hypertension, Peripheral Arterial Disease Denies history of Angina, Arrhythmia, Congestive Heart Failure, Deep Vein Thrombosis, Hypotension, Myocardial Infarction, Peripheral Venous Disease, Phlebitis, Vasculitis  Gastrointestinal Denies history of Cirrhosis , Colitis, Crohnoos, Hepatitis A, Hepatitis B, Hepatitis C Endocrine Patient has history of Type II Diabetes Denies history of Type I Diabetes Genitourinary Denies history of End Stage Renal Disease Immunological Denies history of Lupus Erythematosus, Raynaudoos, Scleroderma Integumentary (Skin) Denies history of History of Burn Musculoskeletal Denies history of Gout, Rheumatoid Arthritis, Osteoarthritis, Osteomyelitis Neurologic Patient has history of Neuropathy Denies history of Dementia, Quadriplegia, Paraplegia, Seizure Disorder Oncologic Denies history of Received Chemotherapy, Received Radiation Psychiatric Denies history of Anorexia/bulimia, Confinement Anxiety Hospitalization/Surgery History - coronoary artery bypass. - cardiac cath. - back surgery. - appendectomy. - tonsillectomy. Medical A  Surgical History Notes nd Cardiovascular CVA, hyperlipidemia, carotid stenosis Gastrointestinal colon polyps Genitourinary Renal Insufficiency Integumentary (Skin) (L) foot ulcer - Objective Constitutional respirations regular, non-labored and within target range for patient.. Vitals Time Taken: 2:49 PM, Height: 70 in, Weight: 225 lbs, BMI: 32.3, Temperature: 98.8 F, Pulse: 65 bpm, Respiratory Rate: 18 breaths/min, Blood Pressure: 121/63 mmHg, Capillary Blood Glucose: 139 mg/dl. General Notes: glucose per pt report Cardiovascular 2+ dorsalis pedis/posterior tibialis pulses. General Notes: Right first metatarsal: The plantar aspect has a small opening with no drainage and depth of 0.2cm. There is no callus present. There is no swelling or erythema noted Integumentary (Hair, Skin) Wound #4 status is Open. Original cause of wound was Blister. The date acquired was: 05/07/2020. The wound has been in treatment 1 weeks. The wound is located on the Right Metatarsal head first. The wound measures 0.3cm length x 0.1cm width x 0.2cm depth; 0.024cm^2 area and 0.005cm^3 volume. There is Fat Layer (Subcutaneous Tissue) exposed. There is no tunneling or undermining noted. There is a medium amount of serosanguineous drainage noted. The wound margin is thickened. There is large (67-100%) pink granulation within the wound bed. There is no necrotic tissue within the wound bed. Assessment Active Problems ICD-10 Type 2 diabetes mellitus with foot ulcer Type 2 diabetes mellitus with diabetic neuropathy, unspecified Atherosclerotic heart disease of native coronary artery without angina pectoris Essential (primary) hypertension The wound today appears well-healing. Dimensions are smaller. He has good granulation tissue. I did a very superficial debridement to stimulate the wound bed. I recommended he continue to offload aggressively and continue with silver alginate every other day with dressing changes.  We will see him in follow-up in 1 week. ABIs are currently being scheduled. Last visit he told me he had obtained an x-ray at [facility]. We contacted them however they stated there was no x-ray. We rediscussed this today however since wound is healing well with no signs of infection we will not pursue trying to find the previous x-ray nor obtain a new one. Procedures Wound #4 Pre-procedure diagnosis of Wound #4 is a Diabetic Wound/Ulcer of the Lower Extremity located on the Right Metatarsal head first .Severity of Tissue Pre Debridement is: Fat layer exposed. There was a Excisional Skin/Subcutaneous Tissue Debridement with a total area of 0.03 sq cm performed by Kalman Shan, DO. With the following instrument(s): Curette to remove Viable and Non-Viable tissue/material. Material removed includes Subcutaneous Tissue, Skin: Dermis, and Skin: Epidermis after achieving pain control using Lidocaine. No specimens were taken. A time out was conducted at 15:24, prior to the start of the procedure. A Minimum amount of bleeding was controlled with Pressure. The procedure was tolerated well with a pain level of 0 throughout and a pain level of 0 following the procedure. Post Debridement Measurements: 0.3cm length x 0.1cm width x 0.2cm depth; 0.005cm^3 volume. Character of  Wound/Ulcer Post Debridement is improved. Severity of Tissue Post Debridement is: Fat layer exposed. Post procedure Diagnosis Wound #4: Same as Pre-Procedure Plan Follow-up Appointments: Return Appointment in 1 week. - with Dr. Heber [place] Bathing/ Shower/ Hygiene: May shower and wash wound with soap and water. - on days that dressing is changed Off-Loading: Open toe surgical shoe to: - right foot with felt WOUND #4: - Metatarsal head first Wound Laterality: Right Cleanser: Byram Ancillary Kit - 15 Day Supply (Generic) Every Other Day/15 Days Discharge Instructions: Use supplies as instructed; Kit contains: (15) Saline  Bullets; (15) 3x3 Gauze; 15 pr Gloves Prim Dressing: KerraCel Ag Gelling Fiber Dressing, 2x2 in (silver alginate) (Generic) Every Other Day/15 Days ary Discharge Instructions: Apply silver alginate to wound bed as instructed Secondary Dressing: Woven Gauze Sponges 2x2 in (Generic) Every Other Day/15 Days Discharge Instructions: Apply over primary dressing as directed. Secondary Dressing: Felt 2.5 yds x 5.5 in Every Other Day/15 Days Discharge Instructions: Apply over primary dressing as directed. Secured With: Child psychotherapist, Sterile 2x75 (in/in) (Generic) Every Other Day/15 Days Discharge Instructions: Secure with stretch gauze as directed. Secured With: Paper T ape, 1x10 (in/yd) (Generic) Every Other Day/15 Days Discharge Instructions: Secure dressing with tape as directed. 1. Silver alginate every other day with dressing changes 2. Aggressive offloading 3. Follow-up next week Electronic Signature(s) Signed: 07/18/2020 3:43:56 PM By: Kalman Shan DO Entered By: Kalman Shan on 07/18/2020 15:41:57 -------------------------------------------------------------------------------- HxROS Details Patient Name: Date of Service: [name]. 07/18/2020 2:45 PM Medical Record Number: [id number] Patient Account Number: [id number] Date of Birth/Sex: Treating RN: 26-Dec-1950 (70 y.o. Erie Noe Primary Care Marguerite Barba: Allyn Kenner Other Clinician: Referring Walaa Carel: Treating Jenna Routzahn/Extender: Dierdre Harness in Treatment: 1 Information Obtained From Patient Eyes Medical History: Positive for: Cataracts - removed Negative for: Glaucoma; Optic Neuritis Ear/Nose/Mouth/Throat Medical History: Negative for: Chronic sinus problems/congestion; Middle ear problems Hematologic/Lymphatic Medical History: Negative for: Anemia; Hemophilia; Human Immunodeficiency Virus; Lymphedema; Sickle Cell Disease Respiratory Medical History: Positive  for: Sleep Apnea Negative for: Aspiration; Asthma; Chronic Obstructive Pulmonary Disease (COPD); Pneumothorax; Tuberculosis Cardiovascular Medical History: Positive for: Coronary Artery Disease - CABG x5; Hypertension; Peripheral Arterial Disease Negative for: Angina; Arrhythmia; Congestive Heart Failure; Deep Vein Thrombosis; Hypotension; Myocardial Infarction; Peripheral Venous Disease; Phlebitis; Vasculitis Past Medical History Notes: CVA, hyperlipidemia, carotid stenosis Gastrointestinal Medical History: Negative for: Cirrhosis ; Colitis; Crohns; Hepatitis A; Hepatitis B; Hepatitis C Past Medical History Notes: colon polyps Endocrine Medical History: Positive for: Type II Diabetes Negative for: Type I Diabetes Time with diabetes: over 10 years Treated with: Insulin, Oral agents Blood sugar tested every day: Yes Tested : 2-3 times a day Blood sugar testing results: Breakfast: 160 Genitourinary Medical History: Negative for: End Stage Renal Disease Past Medical History Notes: Renal Insufficiency Immunological Medical History: Negative for: Lupus Erythematosus; Raynauds; Scleroderma Integumentary (Skin) Medical History: Negative for: History of Burn Past Medical History Notes: (L) foot ulcer - Musculoskeletal Medical History: Negative for: Gout; Rheumatoid Arthritis; Osteoarthritis; Osteomyelitis Neurologic Medical History: Positive for: Neuropathy Negative for: Dementia; Quadriplegia; Paraplegia; Seizure Disorder Oncologic Medical History: Negative for: Received Chemotherapy; Received Radiation Psychiatric Medical History: Negative for: Anorexia/bulimia; Confinement Anxiety HBO Extended History Items Eyes: Cataracts Immunizations Pneumococcal Vaccine: Received Pneumococcal Vaccination: No Implantable Devices No devices added Hospitalization / Surgery History Type of Hospitalization/Surgery coronoary artery bypass cardiac cath back  surgery appendectomy tonsillectomy Family and Social History Cancer: Yes - Mother; Diabetes: Yes - Father,Siblings; Heart Disease: Yes - Father,Siblings; Hereditary Spherocytosis: No;  Hypertension: Yes - Mother,Father,Siblings; Kidney Disease: No; Lung Disease: No; Seizures: No; Stroke: No; Thyroid Problems: No; Tuberculosis: No; Never smoker; Marital Status - Married; Alcohol Use: Never; Drug Use: No History; Caffeine Use: Daily - coffee; Financial Concerns: No; Food, Clothing or Shelter Needs: No; Support System Lacking: No; Transportation Concerns: No Electronic Signature(s) Signed: 07/18/2020 3:43:56 PM By: Kalman Shan DO Signed: 07/18/2020 5:45:48 PM By: Rhae Hammock RN Entered By: Kalman Shan on 07/18/2020 15:37:57 -------------------------------------------------------------------------------- SuperBill Details Patient Name: Date of Service: [name]. 07/18/2020 Medical Record Number: [id number] Patient Account Number: [id number] Date of Birth/Sex: Treating RN: [birth year] (70 y.o. [name], [name] Primary Care Bernadene Garside: Allyn Kenner Other Clinician: Referring Mikeila Burgen: Treating Anne-Marie Genson/Extender: Dierdre Harness in Treatment: 1 Diagnosis Coding ICD-10 Codes Code Description [phone] Type 2 diabetes mellitus with foot ulcer E11.40 Type 2 diabetes mellitus with diabetic neuropathy, unspecified I25.10 Atherosclerotic heart disease of native coronary artery without angina pectoris I10 Essential (primary) hypertension Facility Procedures CPT4 Code: [id number] Description: 02890 - DEB SUBQ TISSUE 20 SQ CM/< ICD-10 Diagnosis Description E11.621 Type 2 diabetes mellitus with foot ulcer Modifier: Quantity: 1 Physician Procedures : CPT4 Code Description Modifier [id number] - WC PHYS SUBQ TISS 20 SQ CM ICD-10 Diagnosis Description E11.621 Type 2 diabetes mellitus with foot ulcer Quantity: 1 Electronic Signature(s) Signed: 07/18/2020  3:43:56 PM By: Kalman Shan DO Entered By: Kalman Shan on 07/18/2020 15:43:08

## 2020-07-23 NOTE — Progress Notes (Signed)
[name], [name] ([id number]) Visit Report for 07/18/2020 Arrival Information Details Patient Name: Date of Service: [name], [name]. 07/18/2020 2:45 PM Medical Record Number: [id number] Patient Account Number: [id number] Date of Birth/Sex: Treating RN: 1950/12/02 (70 y.o. Elizebeth Koller Primary Care Senovia Gauer: Nita Sells Other Clinician: Referring Jeziel Hoffmann: Treating Makahla Kiser/Extender: Tomasita Crumble in Treatment: 1 Visit Information History Since Last Visit Added or deleted any medications: No Patient Arrived: Ambulatory Any new allergies or adverse reactions: No Arrival Time: 14:48 Had a fall or experienced change in No Accompanied By: alone activities of daily living that may affect Transfer Assistance: None risk of falls: Patient Identification Verified: Yes Signs or symptoms of abuse/neglect since last visito No Secondary Verification Process Completed: Yes Hospitalized since last visit: No Patient Requires Transmission-Based Precautions: No Implantable device outside of the clinic excluding No Patient Has Alerts: Yes cellular tissue based products placed in the center Patient Alerts: Patient on Blood Thinner since last visit: R ABI non compressible Has Dressing in Place as Prescribed: Yes Pain Present Now: No Electronic Signature(s) Signed: 07/18/2020 6:17:09 PM By: Zandra Abts RN, BSN Entered By: Zandra Abts on 07/18/2020 14:49:11 -------------------------------------------------------------------------------- Lower Extremity Assessment Details Patient Name: Date of Service: [name]. 07/18/2020 2:45 PM Medical Record Number: [id number] Patient Account Number: [id number] Date of Birth/Sex: Treating RN: [birth year] (69 y.o. Elizebeth Koller Primary Care Shereese Bonnie: Nita Sells Other Clinician: Referring Chloey Ricard: Treating Hershell Brandl/Extender: Tomasita Crumble in Treatment: 1 Edema Assessment Assessed: [Left: No]  [Right: No] Edema: [Left: Ye] [Right: s] Calf Left: Right: Point of Measurement: 32 cm From Medial Instep 41 cm Ankle Left: Right: Point of Measurement: 11 cm From Medial Instep 27 cm Vascular Assessment Pulses: Dorsalis Pedis Palpable: [Right:Yes] Electronic Signature(s) Signed: 07/18/2020 6:17:09 PM By: Zandra Abts RN, BSN Entered By: Zandra Abts on 07/18/2020 14:56:47 -------------------------------------------------------------------------------- Multi Wound Chart Details Patient Name: Date of Service: [name]. 07/18/2020 2:45 PM Medical Record Number: [id number] Patient Account Number: [id number] Date of Birth/Sex: Treating RN: 02/10/1951 (70 y.o. Charlean Merl, [name] Primary Care Aily Tzeng: Nita Sells Other Clinician: Referring Dajanay Northrup: Treating Earnest Mcgillis/Extender: Tomasita Crumble in Treatment: 1 Vital Signs Height(in): 70 Capillary Blood Glucose(mg/dl): 696 Weight(lbs): 789 Pulse(bpm): 65 Body Mass Index(BMI): 32 Blood Pressure(mmHg): 121/63 Temperature(F): 98.8 Respiratory Rate(breaths/min): 18 Photos: [4:No Photos Right Metatarsal head first] [N/A:N/A N/A] Wound Location: [4:Blister] [N/A:N/A] Wounding Event: [4:Diabetic Wound/Ulcer of the Lower] [N/A:N/A] Primary Etiology: [4:Extremity Cataracts, Sleep Apnea, Coronary] [N/A:N/A] Comorbid History: [4:Artery Disease, Hypertension, Peripheral Arterial Disease, Type II Diabetes, Neuropathy 05/07/2020] [N/A:N/A] Date Acquired: [4:1] [N/A:N/A] Weeks of Treatment: [4:Open] [N/A:N/A] Wound Status: [4:0.3x0.1x0.2] [N/A:N/A] Measurements L x W x D (cm) [4:0.024] [N/A:N/A] A (cm) : rea [4:0.005] [N/A:N/A] Volume (cm) : [4:61.90%] [N/A:N/A] % Reduction in A [4:rea: 73.70%] [N/A:N/A] % Reduction in Volume: [4:Grade 2] [N/A:N/A] Classification: [4:Medium] [N/A:N/A] Exudate A mount: [4:Serosanguineous] [N/A:N/A] Exudate Type: [4:red, brown] [N/A:N/A] Exudate Color: [4:Thickened]  [N/A:N/A] Wound Margin: [4:Large (67-100%)] [N/A:N/A] Granulation A mount: [4:Pink] [N/A:N/A] Granulation Quality: [4:None Present (0%)] [N/A:N/A] Necrotic A mount: [4:Fat Layer (Subcutaneous Tissue): Yes N/A] Exposed Structures: [4:Fascia: No Tendon: No Muscle: No Joint: No Bone: No Medium (34-66%)] [N/A:N/A] Epithelialization: [4:Debridement - Excisional] [N/A:N/A] Debridement: Pre-procedure Verification/Time Out 15:24 [N/A:N/A] Taken: [4:Lidocaine] [N/A:N/A] Pain Control: [4:Subcutaneous] [N/A:N/A] Tissue Debrided: [4:Skin/Subcutaneous Tissue] [N/A:N/A] Level: [4:0.03] [N/A:N/A] Debridement A (sq cm): [4:rea Curette] [N/A:N/A] Instrument: [4:Minimum] [N/A:N/A] Bleeding: [4:Pressure] [N/A:N/A] Hemostasis A chieved: [4:0] [N/A:N/A] Procedural Pain: [4:0] [N/A:N/A] Post Procedural Pain: [4:Procedure was tolerated  well] [N/A:N/A] Debridement Treatment Response: [4:0.3x0.1x0.2] [N/A:N/A] Post Debridement Measurements L x W x D (cm) [4:0.005] [N/A:N/A] Post Debridement Volume: (cm) [4:Debridement] [N/A:N/A] Procedures Performed: Treatment Notes Electronic Signature(s) Signed: 07/18/2020 3:43:56 PM By: Geralyn Corwin DO Signed: 07/18/2020 5:45:48 PM By: Fonnie Mu RN Entered By: Geralyn Corwin on 07/18/2020 15:29:04 -------------------------------------------------------------------------------- Multi-Disciplinary Care Plan Details Patient Name: Date of Service: [name]. 07/18/2020 2:45 PM Medical Record Number: [id number] Patient Account Number: [id number] Date of Birth/Sex: Treating RN: [birth year] (70 y.o. Charlean Merl, [name] Primary Care Emiah Pellicano: Nita Sells Other Clinician: Referring Ariauna Farabee: Treating Esti Demello/Extender: Tomasita Crumble in Treatment: 1 Multidisciplinary Care Plan reviewed with physician Active Inactive Nutrition Nursing Diagnoses: Impaired glucose control: actual or potential Potential for alteratiion in  Nutrition/Potential for imbalanced nutrition Goals: Patient/caregiver agrees to and verbalizes understanding of need to use nutritional supplements and/or vitamins as prescribed Date Initiated: 07/11/2020 Target Resolution Date: 08/09/2020 Goal Status: Active Patient/caregiver will maintain therapeutic glucose control Date Initiated: 07/11/2020 Target Resolution Date: 08/09/2020 Goal Status: Active Interventions: Assess HgA1c results as ordered upon admission and as needed Assess patient nutrition upon admission and as needed per policy Provide education on elevated blood sugars and impact on wound healing Provide education on nutrition Notes: Wound/Skin Impairment Nursing Diagnoses: Impaired tissue integrity Knowledge deficit related to ulceration/compromised skin integrity Goals: Patient/caregiver will verbalize understanding of skin care regimen Date Initiated: 07/11/2020 Target Resolution Date: 08/09/2020 Goal Status: Active Ulcer/skin breakdown will have a volume reduction of 30% by week 4 Date Initiated: 07/11/2020 Target Resolution Date: 08/09/2020 Goal Status: Active Interventions: Assess patient/caregiver ability to obtain necessary supplies Assess patient/caregiver ability to perform ulcer/skin care regimen upon admission and as needed Assess ulceration(s) every visit Provide education on ulcer and skin care Notes: Electronic Signature(s) Signed: 07/18/2020 5:45:48 PM By: Fonnie Mu RN Entered By: Fonnie Mu on 07/18/2020 15:26:28 -------------------------------------------------------------------------------- Pain Assessment Details Patient Name: Date of Service: [name]. 07/18/2020 2:45 PM Medical Record Number: [id number] Patient Account Number: [id number] Date of Birth/Sex: Treating RN: 1950-10-17 (70 y.o. Elizebeth Koller Primary Care Aneliese Beaudry: Nita Sells Other Clinician: Referring Nashton Belson: Treating Anand Tejada/Extender: Tomasita Crumble in Treatment: 1 Active Problems Location of Pain Severity and Description of Pain Patient Has Paino No Site Locations Pain Management and Medication Current Pain Management: Electronic Signature(s) Signed: 07/18/2020 6:17:09 PM By: Zandra Abts RN, BSN Entered By: Zandra Abts on 07/18/2020 14:49:41 -------------------------------------------------------------------------------- Patient/Caregiver Education Details Patient Name: Date of Service: [name] 4/14/2022andnbsp2:45 PM Medical Record Number: [id number] Patient Account Number: [id number] Date of Birth/Gender: Treating RN: 11-Jun-1950 (70 y.o. Lucious Groves Primary Care Physician: Nita Sells Other Clinician: Referring Physician: Treating Physician/Extender: Tomasita Crumble in Treatment: 1 Education Assessment Education Provided To: Patient Education Topics Provided Wound/Skin Impairment: Methods: Explain/Verbal Responses: State content correctly Electronic Signature(s) Signed: 07/18/2020 5:45:48 PM By: Fonnie Mu RN Entered By: Fonnie Mu on 07/18/2020 15:26:41 -------------------------------------------------------------------------------- Wound Assessment Details Patient Name: Date of Service: [name]. 07/18/2020 2:45 PM Medical Record Number: [id number] Patient Account Number: [id number] Date of Birth/Sex: Treating RN: [birth year] (69 y.o. Elizebeth Koller Primary Care Ashby Moskal: Nita Sells Other Clinician: Referring Tierria Watson: Treating Adlynn Lowenstein/Extender: Tomasita Crumble in Treatment: 1 Wound Status Wound Number: 4 Primary Diabetic Wound/Ulcer of the Lower Extremity Etiology: Wound Location: Right Metatarsal head first Wound Open Wounding Event: Blister Status: Date Acquired: 05/07/2020 Comorbid Cataracts, Sleep Apnea, Coronary Artery Disease, Hypertension, Weeks Of Treatment: 1 History: Peripheral Arterial Disease,  Type II Diabetes, Neuropathy Clustered Wound: No Photos Wound Measurements Length: (cm) 0.3 Width: (cm) 0.1 Depth: (cm) 0.2 Area: (cm) 0.024 Volume: (cm) 0.005 % Reduction in Area: 61.9% % Reduction in Volume: 73.7% Epithelialization: Medium (34-66%) Tunneling: No Undermining: No Wound Description Classification: Grade 2 Wound Margin: Thickened Exudate Amount: Medium Exudate Type: Serosanguineous Exudate Color: red, brown Foul Odor After Cleansing: No Slough/Fibrino No Wound Bed Granulation Amount: Large (67-100%) Exposed Structure Granulation Quality: Pink Fascia Exposed: No Necrotic Amount: None Present (0%) Fat Layer (Subcutaneous Tissue) Exposed: Yes Tendon Exposed: No Muscle Exposed: No Joint Exposed: No Bone Exposed: No Electronic Signature(s) Signed: 07/18/2020 6:17:09 PM By: Zandra Abts RN, BSN Signed: 07/23/2020 8:07:32 AM By: Karl Ito Entered By: Karl Ito on 07/18/2020 16:48:01 -------------------------------------------------------------------------------- Vitals Details Patient Name: Date of Service: [name]. 07/18/2020 2:45 PM Medical Record Number: [id number] Patient Account Number: [id number] Date of Birth/Sex: Treating RN: [birth year] (71 y.o. Elizebeth Koller Primary Care Markeise Mathews: Nita Sells Other Clinician: Referring Temari Schooler: Treating Machael Raine/Extender: Tomasita Crumble in Treatment: 1 Vital Signs Time Taken: 14:49 Temperature (F): 98.8 Height (in): 70 Pulse (bpm): 65 Weight (lbs): 225 Respiratory Rate (breaths/min): 18 Body Mass Index (BMI): 32.3 Blood Pressure (mmHg): 121/63 Capillary Blood Glucose (mg/dl): 702 Reference Range: 80 - 120 mg / dl Notes glucose per pt report Electronic Signature(s) Signed: 07/18/2020 6:17:09 PM By: Zandra Abts RN, BSN Entered By: Zandra Abts on 07/18/2020 14:49:35

## 2020-07-25 ENCOUNTER — Other Ambulatory Visit: Payer: Self-pay

## 2020-07-25 ENCOUNTER — Encounter (HOSPITAL_BASED_OUTPATIENT_CLINIC_OR_DEPARTMENT_OTHER): Payer: Medicare HMO | Admitting: Internal Medicine

## 2020-07-25 DIAGNOSIS — E11621 Type 2 diabetes mellitus with foot ulcer: Secondary | ICD-10-CM | POA: Diagnosis not present

## 2020-07-25 DIAGNOSIS — L97529 Non-pressure chronic ulcer of other part of left foot with unspecified severity: Secondary | ICD-10-CM | POA: Diagnosis not present

## 2020-07-25 DIAGNOSIS — I2510 Atherosclerotic heart disease of native coronary artery without angina pectoris: Secondary | ICD-10-CM | POA: Diagnosis not present

## 2020-07-25 DIAGNOSIS — I10 Essential (primary) hypertension: Secondary | ICD-10-CM | POA: Diagnosis not present

## 2020-07-25 DIAGNOSIS — E1140 Type 2 diabetes mellitus with diabetic neuropathy, unspecified: Secondary | ICD-10-CM | POA: Diagnosis not present

## 2020-07-25 DIAGNOSIS — Z951 Presence of aortocoronary bypass graft: Secondary | ICD-10-CM | POA: Diagnosis not present

## 2020-07-25 NOTE — Progress Notes (Signed)
FORNEY, KLEINPETER ([id number]) Visit Report for 07/25/2020 Chief Complaint Document Details Patient Name: Date of Service: [name], [name]. 07/25/2020 3:00 PM Medical Record Number: [id number] Patient Account Number: [id number] Date of Birth/Sex: Treating RN: November 27, 1950 (70 y.o. [name] Primary Care Provider: Nita Sells Other Clinician: Referring Provider: Treating Provider/Extender: Tomasita Crumble in Treatment: 2 Information Obtained from: Patient Chief Complaint the patient arrives for evaluation of of a wound on his first right metatarsal head, plantar aspect Electronic Signature(s) Signed: 07/25/2020 5:35:11 PM By: Geralyn Corwin DO Entered By: Geralyn Corwin on 07/25/2020 17:24:06 -------------------------------------------------------------------------------- Debridement Details Patient Name: Date of Service: [name]. 07/25/2020 3:00 PM Medical Record Number: [id number] Patient Account Number: [id number] Date of Birth/Sex: Treating RN: 05-03-1950 (69 y.o. [name] Primary Care Provider: Nita Sells Other Clinician: Referring Provider: Treating Provider/Extender: Tomasita Crumble in Treatment: 2 Debridement Performed for Assessment: Wound #4 Right Metatarsal head first Performed By: Physician Geralyn Corwin, DO Debridement Type: Debridement Severity of Tissue Pre Debridement: Fat layer exposed Level of Consciousness (Pre-procedure): Awake and Alert Pre-procedure Verification/Time Out Yes - 15:30 Taken: Start Time: 15:31 Pain Control: Lidocaine 4% T opical Solution T Area Debrided (L x W): otal 0.5 (cm) x 0.5 (cm) = 0.25 (cm) Tissue and other material debrided: Viable, Non-Viable, Callus, Subcutaneous, Skin: Dermis , Skin: Epidermis, Fibrin/Exudate Level: Skin/Subcutaneous Tissue Debridement Description: Excisional Instrument: Curette Bleeding: Minimum Hemostasis Achieved: Pressure End Time:  15:34 Procedural Pain: 0 Post Procedural Pain: 0 Response to Treatment: Procedure was tolerated well Level of Consciousness (Post- Awake and Alert procedure): Post Debridement Measurements of Total Wound Length: (cm) 0.2 Width: (cm) 0.2 Depth: (cm) 0.2 Volume: (cm) 0.006 Character of Wound/Ulcer Post Debridement: Improved Severity of Tissue Post Debridement: Fat layer exposed Post Procedure Diagnosis Same as Pre-procedure Electronic Signature(s) Signed: 07/25/2020 5:27:07 PM By: Shawn Stall Signed: 07/25/2020 5:35:11 PM By: Geralyn Corwin DO Entered By: Shawn Stall on 07/25/2020 15:34:50 -------------------------------------------------------------------------------- HPI Details Patient Name: Date of Service: [name]. 07/25/2020 3:00 PM Medical Record Number: [id number] Patient Account Number: [id number] Date of Birth/Sex: Treating RN: 08-24-1950 (70 y.o. [name] Primary Care Provider: Nita Sells Other Clinician: Referring Provider: Treating Provider/Extender: Tomasita Crumble in Treatment: 2 History of Present Illness HPI Description: 07/12/19 [name] is a 71 year old male with a past medical history of insulin-dependent type 2 diabetes, essential hypertension and coronary artery disease status post CABG in 2009. He has been seen in our clinic on three separate occasions for diabetic foot wounds. Last seen in 2018 for a left fifth metatarsal head diabetic foot ulcer. This was treated with silver alginate and offloading. Other previous visits were in 2009 and 2015. Patient states that 2 months ago he noticed a blister to the bottom of his foot. He reports using a foot pedal and applying constant pressure to this area for 3 hours a day. 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 He has noticed minimal drainage to the wound bed and denies any pain,  swelling or erythema to the right foot. 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 In the past 2 months he was seen at Podiatry in [place], [place] and [place] [facility] for this issue. He states Music therapist obtained an x-ray recently and reports it showed  [name] significant findings. He was started on a cream for which he can not remember the name. He states he uses this with daily dressing changes. He was not satisfied with his care at either location and has since decided to follow-up with [place]. Overall he feels well 07/18/20 Patient presents for his 1 week follow-up. He reports [name] issues in the past week. He has been using his surgical shoe and has been aggressive with offloading. He is pleased with the progress of his wound healing. He denies any drainage, increased warmth or erythema to the foot. 4/21 Patient presents for 1 week follow-up. He has been using silver alginate every other day along with a surgical shoe. He denies drainage, increased warmth or erythema to the foot. Electronic Signature(s) Signed: 07/25/2020 5:35:11 PM By: Geralyn Corwin DO Entered By: Geralyn Corwin on 07/25/2020 17:25:21 -------------------------------------------------------------------------------- Physical Exam Details Patient Name: Date of Service: [name]. 07/25/2020 3:00 PM Medical Record Number: [id number] Patient Account Number: [id number] Date of Birth/Sex: Treating RN: November 10, 1950 (70 y.o. [name] Primary Care Provider: Nita Sells Other Clinician: Referring Provider: Treating Provider/Extender: Tomasita Crumble in Treatment: 2 Constitutional respirations regular, non-labored and within target range for patient.[name] Psychiatric pleasant and cooperative. Notes Right first metatarsal: The plantar aspect has a small opening with undermining noted circumferentially. There is callus  surrounding the wound bed. This was debrided. There was healthy granulation tissue underneath. [name] signs of infection noted Electronic Signature(s) Signed: 07/25/2020 5:35:11 PM By: Geralyn Corwin DO Entered By: Geralyn Corwin on 07/25/2020 17:27:21 -------------------------------------------------------------------------------- Physician Orders Details Patient Name: Date of Service: [name]. 07/25/2020 3:00 PM Medical Record Number: [id number] Patient Account Number: [id number] Date of Birth/Sex: Treating RN: December 26, 1950 (70 y.o. [name] Primary Care Provider: Nita Sells Other Clinician: Referring Provider: Treating Provider/Extender: Tomasita Crumble in Treatment: 2 Verbal / Phone Orders: [name] Diagnosis Coding ICD-10 Coding Code Description E11.621 Type 2 diabetes mellitus with foot ulcer E11.40 Type 2 diabetes mellitus with diabetic neuropathy, unspecified I25.10 Atherosclerotic heart disease of native coronary artery without angina pectoris I10 Essential (primary) hypertension Follow-up Appointments ppointment in 1 week. - with Dr. Mikey Bussing Return A Bathing/ Shower/ Hygiene May shower and wash wound with soap and water. - with dressing changes only. Off-Loading Wedge shoe to: - patient to wear front offloading wedge shoe. Wound Treatment Wound #4 - Metatarsal head first Wound Laterality: Right Cleanser: Soap and Water Every Other Day/15 Days Discharge Instructions: May shower and wash wound with dial antibacterial soap and water prior to dressing change. Prim Dressing: KerraCel Ag Gelling Fiber Dressing, 2x2 in (silver alginate) (Generic) Every Other Day/15 Days ary Discharge Instructions: Apply silver alginate to wound bed as instructed Secondary Dressing: Woven Gauze Sponges 2x2 in (Generic) Every Other Day/15 Days Discharge Instructions: Apply over primary dressing as directed. Secondary Dressing: Optifoam Non-Adhesive Dressing, 4x4  in Every Other Day/15 Days Discharge Instructions: ***Foam donut***Apply over primary dressing as directed. Secured With: Insurance underwriter, Sterile 2x75 (in/in) (Generic) Every Other Day/15 Days Discharge Instructions: Secure with stretch gauze as directed. Secured With: Paper Tape, 1x10 (in/yd) (Generic) Every Other Day/15 Days Discharge Instructions: Secure dressing with tape as directed. Electronic Signature(s) Signed: 07/25/2020 5:35:11 PM By: Geralyn Corwin DO Previous Signature: 07/25/2020 5:27:07 PM Version By: Shawn Stall Entered By: Geralyn Corwin on 07/25/2020 17:29:05 -------------------------------------------------------------------------------- Problem List Details Patient Name: Date of Service: [name]. 07/25/2020 3:00 PM Medical Record Number: [id number] Patient Account Number:  [id number] Date of Birth/Sex: Treating RN: [birth year] (70 y.o. [name] Primary Care Provider: Nita Sells Other Clinician: Referring Provider: Treating Provider/Extender: Tomasita Crumble in Treatment: 2 Active Problems ICD-10 Encounter Code Description Active Date MDM Diagnosis E11.621 Type 2 diabetes mellitus with foot ulcer 07/11/2020 [name] Yes E11.40 Type 2 diabetes mellitus with diabetic neuropathy, unspecified 07/11/2020 [name] Yes I25.10 Atherosclerotic heart disease of native coronary artery without angina pectoris 07/11/2020 [name] Yes I10 Essential (primary) hypertension 07/11/2020 [name] Yes Inactive Problems Resolved Problems Electronic Signature(s) Signed: 07/25/2020 5:35:11 PM By: Geralyn Corwin DO Entered By: Geralyn Corwin on 07/25/2020 17:28:42 -------------------------------------------------------------------------------- Progress Note Details Patient Name: Date of Service: [name]. 07/25/2020 3:00 PM Medical Record Number: [id number] Patient Account Number: [id number] Date of Birth/Sex: Treating RN: July 18, 1950 (70 y.o. [name] Primary Care Provider: Nita Sells Other Clinician: Referring Provider: Treating Provider/Extender: Tomasita Crumble in Treatment: 2 Subjective Chief Complaint Information obtained from Patient the patient arrives for evaluation of of a wound on his first right metatarsal head, plantar aspect History of Present Illness (HPI) 07/12/19 [name] is a 70 year old male with a past medical history of insulin-dependent type 2 diabetes, essential hypertension and coronary artery disease status post CABG in 2009. He has been seen in our clinic on three separate occasions for diabetic foot wounds. Last seen in 2018 for a left fifth metatarsal head diabetic foot ulcer. This was treated with silver alginate and offloading. Other previous visits were in 2009 and 2015. Patient states that 2 months ago he noticed a blister to the bottom of his foot. He reports using a foot pedal and applying constant pressure to this area for 3 hours a day. 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 He has noticed minimal drainage to the wound bed and denies any pain, swelling or erythema to the right foot. 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 In the past 2 months he was seen at Podiatry in [place], [place] and [place] [facility] for this issue. He states [employer] obtained an x-ray recently and reports it showed [name] significant findings. He was started on a cream for which he can not remember the name. He states he uses this with daily dressing changes. He was not satisfied with his care at either location and has since decided to follow-up with [place]. Overall he feels well 07/18/20 Patient presents for his 1 week follow-up. He reports [name] issues in the past week. He has been using his surgical shoe and has been aggressive with  offloading. He is pleased with the progress of his wound healing. He denies any drainage, increased warmth or erythema to the foot. 4/21 Patient presents for 1 week follow-up. He has been using silver alginate every other day along with a surgical shoe. He denies drainage, increased warmth or erythema to the foot. Patient History Information obtained from Patient. Family History Cancer - Mother, Diabetes - Father,Siblings, Heart Disease - Father,Siblings, Hypertension - Mother,Father,Siblings, [name] family history of Hereditary Spherocytosis, Kidney Disease, Lung Disease, Seizures, Stroke, Thyroid Problems, Tuberculosis. Social History Never smoker, Marital Status - Married, Alcohol Use - Never, Drug Use - [name] History, Caffeine Use - Daily - coffee. Medical History Eyes Patient has history of Cataracts - removed Denies history of Glaucoma, Optic Neuritis Ear/Nose/Mouth/Throat Denies  history of Chronic sinus problems/congestion, Middle ear problems Hematologic/Lymphatic Denies history of Anemia, Hemophilia, Human Immunodeficiency Virus, Lymphedema, Sickle Cell Disease Respiratory Patient has history of Sleep Apnea Denies history of Aspiration, Asthma, Chronic Obstructive Pulmonary Disease (COPD), Pneumothorax, Tuberculosis Cardiovascular Patient has history of Coronary Artery Disease - CABG x5, Hypertension, Peripheral Arterial Disease Denies history of Angina, Arrhythmia, Congestive Heart Failure, Deep Vein Thrombosis, Hypotension, Myocardial Infarction, Peripheral Venous Disease, Phlebitis, Vasculitis Gastrointestinal Denies history of Cirrhosis , Colitis, Crohnoos, Hepatitis A, Hepatitis B, Hepatitis C Endocrine Patient has history of Type II Diabetes Denies history of Type I Diabetes Genitourinary Denies history of End Stage Renal Disease Immunological Denies history of Lupus Erythematosus, Raynaudoos, Scleroderma Integumentary (Skin) Denies history of History of  Burn Musculoskeletal Denies history of Gout, Rheumatoid Arthritis, Osteoarthritis, Osteomyelitis Neurologic Patient has history of Neuropathy Denies history of Dementia, Quadriplegia, Paraplegia, Seizure Disorder Oncologic Denies history of Received Chemotherapy, Received Radiation Psychiatric Denies history of Anorexia/bulimia, Confinement Anxiety Hospitalization/Surgery History - coronoary artery bypass. - cardiac cath. - back surgery. - appendectomy. - tonsillectomy. Medical A Surgical History Notes nd Cardiovascular CVA, hyperlipidemia, carotid stenosis Gastrointestinal colon polyps Genitourinary Renal Insufficiency Integumentary (Skin) (L) foot ulcer - Objective Constitutional respirations regular, non-labored and within target range for patient.. Vitals Time Taken: 3:09 PM, Height: 70 in, Weight: 225 lbs, BMI: 32.3, Temperature: 98.8 F, Pulse: 71 bpm, Respiratory Rate: 16 breaths/min, Blood Pressure: 132/67 mmHg, Capillary Blood Glucose: 165 mg/dl. Psychiatric pleasant and cooperative. General Notes: Right first metatarsal: The plantar aspect has a small opening with undermining noted circumferentially. There is callus surrounding the wound bed. This was debrided. There was healthy granulation tissue underneath. [name] signs of infection noted Integumentary (Hair, Skin) Wound #4 status is Open. Original cause of wound was Blister. The date acquired was: 05/07/2020. The wound has been in treatment 2 weeks. The wound is located on the Right Metatarsal head first. The wound measures 0.2cm length x 0.2cm width x 0.2cm depth; 0.031cm^2 area and 0.006cm^3 volume. There is Fat Layer (Subcutaneous Tissue) exposed. There is [name] tunneling or undermining noted. There is a medium amount of serosanguineous drainage noted. The wound margin is well defined and not attached to the wound base. There is large (67-100%) pink granulation within the wound bed. There is [name] necrotic tissue within the  wound bed. General Notes: Calloused periwound Assessment Active Problems ICD-10 Type 2 diabetes mellitus with foot ulcer Type 2 diabetes mellitus with diabetic neuropathy, unspecified Atherosclerotic heart disease of native coronary artery without angina pectoris Essential (primary) hypertension Patients wound has some undermining and the callus was removed and subcutaneous tissue debrided. [name] signs of infection. We discussed using an offloading shoe for which he says he has [name] at home. I would like for him to start using this. We will continue silver alginate every other day with dressing changes. If I do not see improvement with this dressing the next week I will switch to collagen. We also discussed the use of a total contact cast in the near future. Procedures Wound #4 Pre-procedure diagnosis of Wound #4 is a Diabetic Wound/Ulcer of the Lower Extremity located on the Right Metatarsal head first .Severity of Tissue Pre Debridement is: Fat layer exposed. There was a Excisional Skin/Subcutaneous Tissue Debridement with a total area of 0.25 sq cm performed by Geralyn Corwin, DO. With the following instrument(s): Curette to remove Viable and Non-Viable tissue/material. Material removed includes Callus, Subcutaneous Tissue, Skin: Dermis, Skin: Epidermis, and Fibrin/Exudate after achieving pain control using Lidocaine 4% Topical  Solution. A time out was conducted at 15:30, prior to the start of the procedure. A Minimum amount of bleeding was controlled with Pressure. The procedure was tolerated well with a pain level of 0 throughout and a pain level of 0 following the procedure. Post Debridement Measurements: 0.2cm length x 0.2cm width x 0.2cm depth; 0.006cm^3 volume. Character of Wound/Ulcer Post Debridement is improved. Severity of Tissue Post Debridement is: Fat layer exposed. Post procedure Diagnosis Wound #4: Same as Pre-Procedure Plan Follow-up Appointments: Return Appointment in 1  week. - with Dr. Mikey BussingHoffman Bathing/ Shower/ Hygiene: May shower and wash wound with soap and water. - with dressing changes only. Off-Loading: Wedge shoe to: - patient to wear front offloading wedge shoe. WOUND #4: - Metatarsal head first Wound Laterality: Right Cleanser: Soap and Water Every Other Day/15 Days Discharge Instructions: May shower and wash wound with dial antibacterial soap and water prior to dressing change. Prim Dressing: KerraCel Ag Gelling Fiber Dressing, 2x2 in (silver alginate) (Generic) Every Other Day/15 Days ary Discharge Instructions: Apply silver alginate to wound bed as instructed Secondary Dressing: Woven Gauze Sponges 2x2 in (Generic) Every Other Day/15 Days Discharge Instructions: Apply over primary dressing as directed. Secondary Dressing: Optifoam Non-Adhesive Dressing, 4x4 in Every Other Day/15 Days Discharge Instructions: ***Foam donut***Apply over primary dressing as directed. Secured With: Insurance underwriterConforming Stretch Gauze Bandage, Sterile 2x75 (in/in) (Generic) Every Other Day/15 Days Discharge Instructions: Secure with stretch gauze as directed. Secured With: Paper T ape, 1x10 (in/yd) (Generic) Every Other Day/15 Days Discharge Instructions: Secure dressing with tape as directed. 1. Silver alginate 2. Front offloading shoe 3. Follow-up in 1 week 4. In office sharp debridement Electronic Signature(s) Signed: 07/25/2020 5:35:11 PM By: Geralyn CorwinHoffman, Fredick Schlosser DO Entered By: Geralyn CorwinHoffman, Lakyra Tippins on 07/25/2020 17:33:27 -------------------------------------------------------------------------------- HxROS Details Patient Name: Date of Service: [name] OharaBISTYGA, RO BERT W. 07/25/2020 3:00 PM Medical Record Number: [id number] Patient Account Number: [id number] Date of Birth/Sex: Treating RN: 06/10/1950 [age] y.o. Tammy SoursM) Deaton, Bobbi Primary Care Provider: Nita SellsHall, John Other Clinician: Referring Provider: Treating Provider/Extender: Tomasita CrumbleHoffman, Manha Amato Hall, John Weeks in Treatment:  2 Information Obtained From Patient Eyes Medical History: Positive for: Cataracts - removed Negative for: Glaucoma; Optic Neuritis Ear/Nose/Mouth/Throat Medical History: Negative for: Chronic sinus problems/congestion; Middle ear problems Hematologic/Lymphatic Medical History: Negative for: Anemia; Hemophilia; Human Immunodeficiency Virus; Lymphedema; Sickle Cell Disease Respiratory Medical History: Positive for: Sleep Apnea Negative for: Aspiration; Asthma; Chronic Obstructive Pulmonary Disease (COPD); Pneumothorax; Tuberculosis Cardiovascular Medical History: Positive for: Coronary Artery Disease - CABG x5; Hypertension; Peripheral Arterial Disease Negative for: Angina; Arrhythmia; Congestive Heart Failure; Deep Vein Thrombosis; Hypotension; Myocardial Infarction; Peripheral Venous Disease; Phlebitis; Vasculitis Past Medical History Notes: CVA, hyperlipidemia, carotid stenosis Gastrointestinal Medical History: Negative for: Cirrhosis ; Colitis; Crohns; Hepatitis A; Hepatitis B; Hepatitis C Past Medical History Notes: colon polyps Endocrine Medical History: Positive for: Type II Diabetes Negative for: Type I Diabetes Time with diabetes: over 10 years Treated with: Insulin, Oral agents Blood sugar tested every day: Yes Tested : 2-3 times a day Blood sugar testing results: Breakfast: 160 Genitourinary Medical History: Negative for: End Stage Renal Disease Past Medical History Notes: Renal Insufficiency Immunological Medical History: Negative for: Lupus Erythematosus; Raynauds; Scleroderma Integumentary (Skin) Medical History: Negative for: History of Burn Past Medical History Notes: (L) foot ulcer - Musculoskeletal Medical History: Negative for: Gout; Rheumatoid Arthritis; Osteoarthritis; Osteomyelitis Neurologic Medical History: Positive for: Neuropathy Negative for: Dementia; Quadriplegia; Paraplegia; Seizure Disorder Oncologic Medical History: Negative  for: Received Chemotherapy; Received Radiation Psychiatric Medical History: Negative for: Anorexia/bulimia; Confinement  Anxiety HBO Extended History Items Eyes: Cataracts Immunizations Pneumococcal Vaccine: Received Pneumococcal Vaccination: [name] Implantable Devices [name] devices added Hospitalization / Surgery History Type of Hospitalization/Surgery coronoary artery bypass cardiac cath back surgery appendectomy tonsillectomy Family and Social History Cancer: Yes - Mother; Diabetes: Yes - Father,Siblings; Heart Disease: Yes - Father,Siblings; Hereditary Spherocytosis: [name]; Hypertension: Yes - Mother,Father,Siblings; Kidney Disease: [name]; Lung Disease: [name]; Seizures: [name]; Stroke: [name]; Thyroid Problems: [name]; Tuberculosis: [name]; Never smoker; Marital Status - Married; Alcohol Use: Never; Drug Use: [name] History; Caffeine Use: Daily - coffee; Financial Concerns: [name]; Food, Clothing or Shelter Needs: [name]; Support System Lacking: [name]; Transportation Concerns: [name] Electronic Signature(s) Signed: 07/25/2020 5:27:07 PM By: Shawn Stall Signed: 07/25/2020 5:35:11 PM By: Geralyn Corwin DO Entered By: Geralyn Corwin on 07/25/2020 17:25:27 -------------------------------------------------------------------------------- SuperBill Details Patient Name: Date of Service: [name]. 07/25/2020 Medical Record Number: [id number] Patient Account Number: [id number] Date of Birth/Sex: Treating RN: 1950-05-09 (70 y.o. [name] Primary Care Provider: Nita Sells Other Clinician: Referring Provider: Treating Provider/Extender: Tomasita Crumble in Treatment: 2 Diagnosis Coding ICD-10 Codes Code Description [phone] Type 2 diabetes mellitus with foot ulcer E11.40 Type 2 diabetes mellitus with diabetic neuropathy, unspecified I25.10 Atherosclerotic heart disease of native coronary artery without angina pectoris I10 Essential (primary) hypertension Facility Procedures CPT4 Code:  [id number] Description: 11042 - DEB SUBQ TISSUE 20 SQ CM/< Modifier: Quantity: 1 Electronic Signature(s) Signed: 07/25/2020 5:35:11 PM By: Geralyn Corwin DO Previous Signature: 07/25/2020 5:27:07 PM Version By: Shawn Stall Entered By: Geralyn Corwin on 07/25/2020 17:34:30

## 2020-07-30 NOTE — Progress Notes (Signed)
AIKEN, WITHEM ([id number]) Visit Report for 07/25/2020 Arrival Information Details Patient Name: Date of Service: [name], [name] Medical Record Number: [id number] Patient Account Number: [id number] Date of Birth/Sex: Treating RN: 11-29-1950 (70 y.o. Lytle Michaels Primary Care Vernesha Talbot: Nita Sells Other Clinician: Referring Zarah Carbon: Treating Frederick Marro/Extender: Tomasita Crumble in Treatment: 2 Visit Information History Since Last Visit Added or deleted any medications: No Patient Arrived: Ambulatory Any new allergies or adverse reactions: No Arrival Time: 15:06 Had a fall or experienced change in No Transfer Assistance: None activities of daily living that may affect Patient Identification Verified: Yes risk of falls: Secondary Verification Process Completed: Yes Signs or symptoms of abuse/neglect since last visito No Patient Requires Transmission-Based Precautions: No Hospitalized since last visit: No Patient Has Alerts: Yes Implantable device outside of the clinic excluding No Patient Alerts: Patient on Blood Thinner cellular tissue based products placed in the center R ABI non compressible since last visit: Has Dressing in Place as Prescribed: Yes Has Footwear/Offloading in Place as Prescribed: Yes Right: Wedge Shoe Pain Present Now: No Electronic Signature(s) Signed: 07/25/2020 5:08:10 [name] By: Antonieta Iba Entered By: Antonieta Iba on 07/25/2020 15:09:28 -------------------------------------------------------------------------------- Lower Extremity Assessment Details Patient Name: Date of Service: [name]. 07/25/2020 3:00 [name] Medical Record Number: [id number] Patient Account Number: [id number] Date of Birth/Sex: Treating RN: 12-Jan-1951 (69 y.o. Lytle Michaels Primary Care Demeco Ducksworth: Nita Sells Other Clinician: Referring Elianah Karis: Treating Angle Dirusso/Extender: Tomasita Crumble in Treatment:  2 Edema Assessment Assessed: [Left: No] [Right: Yes] Edema: [Left: Ye] [Right: s] Calf Left: Right: Point of Measurement: 32 cm From Medial Instep 44 cm Ankle Left: Right: Point of Measurement: 11 cm From Medial Instep 27.5 cm Vascular Assessment Pulses: Dorsalis Pedis Palpable: [Right:Yes] Electronic Signature(s) Signed: 07/25/2020 5:08:10 [name] By: Antonieta Iba Entered By: Antonieta Iba on 07/25/2020 15:15:36 -------------------------------------------------------------------------------- Multi Wound Chart Details Patient Name: Date of Service: [name]. 07/25/2020 3:00 [name] Medical Record Number: [id number] Patient Account Number: [id number] Date of Birth/Sex: Treating RN: Mar 27, 1951 (69 y.o. [name] Primary Care Dontavia Brand: Nita Sells Other Clinician: Referring Amol Domanski: Treating Darenda Fike/Extender: Tomasita Crumble in Treatment: 2 Vital Signs Height(in): 70 Capillary Blood Glucose(mg/dl): 244 Weight(lbs): 010 Pulse(bpm): 71 Body Mass Index(BMI): 32 Blood Pressure(mmHg): 132/67 Temperature(F): 98.8 Respiratory Rate(breaths/min): 16 Photos: [N/A:N/A] Right Metatarsal head first N/A N/A Wound Location: Blister N/A N/A Wounding Event: Diabetic Wound/Ulcer of the Lower N/A N/A Primary Etiology: Extremity Cataracts, Sleep Apnea, Coronary N/A N/A Comorbid History: Artery Disease, Hypertension, Peripheral Arterial Disease, Type II Diabetes, Neuropathy 05/07/2020 N/A N/A Date Acquired: 2 N/A N/A Weeks of Treatment: Open N/A N/A Wound Status: 0.2x0.2x0.2 N/A N/A Measurements L x W x D (cm) 0.031 N/A N/A A (cm) : rea 0.006 N/A N/A Volume (cm) : 50.80% N/A N/A % Reduction in A rea: 68.40% N/A N/A % Reduction in Volume: Grade 2 N/A N/A Classification: Medium N/A N/A Exudate A mount: Serosanguineous N/A N/A Exudate Type: red, brown N/A N/A Exudate Color: Well defined, not attached N/A N/A Wound Margin: Large  (67-100%) N/A N/A Granulation A mount: Pink N/A N/A Granulation Quality: None Present (0%) N/A N/A Necrotic A mount: Fat Layer (Subcutaneous Tissue): Yes N/A N/A Exposed Structures: Fascia: No Tendon: No Muscle: No Joint: No Bone: No Medium (34-66%) N/A N/A Epithelialization: Debridement - Excisional N/A N/A Debridement: Pre-procedure Verification/Time Out 15:30 N/A N/A Taken: Lidocaine 4% Topical Solution N/A N/A Pain Control: Callus, Subcutaneous N/A N/A Tissue Debrided:  Skin/Subcutaneous Tissue N/A N/A Level: 0.25 N/A N/A Debridement A (sq cm): rea Curette N/A N/A Instrument: Minimum N/A N/A Bleeding: Pressure N/A N/A Hemostasis A chieved: 0 N/A N/A Procedural Pain: 0 N/A N/A Post Procedural Pain: Procedure was tolerated well N/A N/A Debridement Treatment Response: 0.2x0.2x0.2 N/A N/A Post Debridement Measurements L x W x D (cm) 0.006 N/A N/A Post Debridement Volume: (cm) Calloused periwound N/A N/A Assessment Notes: Debridement N/A N/A Procedures Performed: Treatment Notes Electronic Signature(s) Signed: 07/25/2020 5:27:07 [name] By: Shawn Stall Signed: 07/25/2020 5:35:11 [name] By: Geralyn Corwin DO Entered By: Geralyn Corwin on 07/25/2020 17:23:54 -------------------------------------------------------------------------------- Multi-Disciplinary Care Plan Details Patient Name: Date of Service: [name]. 07/25/2020 3:00 [name] Medical Record Number: [id number] Patient Account Number: [id number] Date of Birth/Sex: Treating RN: September 19, 1950 (70 y.o. [name] Primary Care Melaine Mcphee: Nita Sells Other Clinician: Referring Cavon Nicolls: Treating Makael Stein/Extender: Tomasita Crumble in Treatment: 2 Multidisciplinary Care Plan reviewed with physician Active Inactive Nutrition Nursing Diagnoses: Impaired glucose control: actual or potential Potential for alteratiion in Nutrition/Potential for imbalanced  nutrition Goals: Patient/caregiver agrees to and verbalizes understanding of need to use nutritional supplements and/or vitamins as prescribed Date Initiated: 07/11/2020 Target Resolution Date: 08/09/2020 Goal Status: Active Patient/caregiver will maintain therapeutic glucose control Date Initiated: 07/11/2020 Target Resolution Date: 08/09/2020 Goal Status: Active Interventions: Assess HgA1c results as ordered upon admission and as needed Assess patient nutrition upon admission and as needed per policy Provide education on elevated blood sugars and impact on wound healing Provide education on nutrition Notes: Wound/Skin Impairment Nursing Diagnoses: Impaired tissue integrity Knowledge deficit related to ulceration/compromised skin integrity Goals: Patient/caregiver will verbalize understanding of skin care regimen Date Initiated: 07/11/2020 Target Resolution Date: 08/09/2020 Goal Status: Active Ulcer/skin breakdown will have a volume reduction of 30% by week 4 Date Initiated: 07/11/2020 Target Resolution Date: 08/09/2020 Goal Status: Active Interventions: Assess patient/caregiver ability to obtain necessary supplies Assess patient/caregiver ability to perform ulcer/skin care regimen upon admission and as needed Assess ulceration(s) every visit Provide education on ulcer and skin care Notes: Electronic Signature(s) Signed: 07/25/2020 5:27:07 [name] By: Shawn Stall Entered By: Shawn Stall on 07/25/2020 15:33:45 -------------------------------------------------------------------------------- Pain Assessment Details Patient Name: Date of Service: [name]. 07/25/2020 3:00 [name] Medical Record Number: [id number] Patient Account Number: [id number] Date of Birth/Sex: Treating RN: 10/20/1950 (71 y.o. Lytle Michaels Primary Care Martez Weiand: Nita Sells Other Clinician: Referring Kalani Sthilaire: Treating Kearah Gayden/Extender: Tomasita Crumble in Treatment: 2 Active  Problems Location of Pain Severity and Description of Pain Patient Has Paino No Site Locations Pain Management and Medication Current Pain Management: Electronic Signature(s) Signed: 07/25/2020 5:08:10 [name] By: Antonieta Iba Entered By: Antonieta Iba on 07/25/2020 15:11:14 -------------------------------------------------------------------------------- Patient/Caregiver Education Details Patient Name: Date of Service: [name], [name] BERT W. 4/21/2022andnbsp3:00 [name] Medical Record Number: [id number] Patient Account Number: [id number] Date of Birth/Gender: Treating RN: 1951/03/03 (70 y.o. [name] Primary Care Physician: Nita Sells Other Clinician: Referring Physician: Treating Physician/Extender: Tomasita Crumble in Treatment: 2 Education Assessment Education Provided To: Patient Education Topics Provided Elevated Blood Sugar/ Impact on Healing: Handouts: Elevated Blood Sugars: How Do They Affect Wound Healing Methods: Explain/Verbal Responses: Reinforcements needed Electronic Signature(s) Signed: 07/25/2020 5:27:07 [name] By: Shawn Stall Entered By: Shawn Stall on 07/25/2020 15:33:56 -------------------------------------------------------------------------------- Wound Assessment Details Patient Name: Date of Service: [name]. 07/25/2020 3:00 [name] Medical Record Number: [id number] Patient Account Number: [id number] Date of Birth/Sex: Treating RN: 1951/02/24 (70 y.o. Lytle Michaels Primary Care Jazzlin Clements: Margo Aye,  John Other Clinician: Referring Yoshiye Kraft: Treating Jazzmen Restivo/Extender: Tomasita Crumble in Treatment: 2 Wound Status Wound Number: 4 Primary Diabetic Wound/Ulcer of the Lower Extremity Etiology: Wound Location: Right Metatarsal head first Wound Open Wounding Event: Blister Status: Date Acquired: 05/07/2020 Comorbid Cataracts, Sleep Apnea, Coronary Artery Disease, Hypertension, Weeks Of Treatment: 2 History:  Peripheral Arterial Disease, Type II Diabetes, Neuropathy Clustered Wound: No Photos Wound Measurements Length: (cm) 0.2 Width: (cm) 0.2 Depth: (cm) 0.2 Area: (cm) 0.031 Volume: (cm) 0.006 Wound Description Classification: Grade 2 Wound Margin: Well defined, not attached Exudate Amount: Medium Exudate Type: Serosanguineous Exudate Color: red, brown Foul Odor After Cleansing: Slough/Fibrino % Reduction in Area: 50.8% % Reduction in Volume: 68.4% Epithelialization: Medium (34-66%) Tunneling: No Undermining: No No No Wound Bed Granulation Amount: Large (67-100%) Exposed Structure Granulation Quality: Pink Fascia Exposed: No Necrotic Amount: None Present (0%) Fat Layer (Subcutaneous Tissue) Exposed: Yes Tendon Exposed: No Muscle Exposed: No Joint Exposed: No Bone Exposed: No Assessment Notes Calloused periwound Electronic Signature(s) Signed: 07/25/2020 5:08:10 [name] By: Antonieta Iba Signed: 07/30/2020 2:09:33 [name] By: Karl Ito Entered By: Karl Ito on 07/25/2020 17:03:04 -------------------------------------------------------------------------------- Vitals Details Patient Name: Date of Service: [name]. 07/25/2020 3:00 [name] Medical Record Number: [id number] Patient Account Number: [id number] Date of Birth/Sex: Treating RN: May 14, 1950 (70 y.o. Lytle Michaels Primary Care Keonda Dow: Nita Sells Other Clinician: Referring Payam Gribble: Treating Anastasia Tompson/Extender: Tomasita Crumble in Treatment: 2 Vital Signs Time Taken: 15:09 Temperature (F): 98.8 Height (in): 70 Pulse (bpm): 71 Weight (lbs): 225 Respiratory Rate (breaths/min): 16 Body Mass Index (BMI): 32.3 Blood Pressure (mmHg): 132/67 Capillary Blood Glucose (mg/dl): 237 Reference Range: 80 - 120 mg / dl Electronic Signature(s) Signed: 07/25/2020 5:08:10 [name] By: Antonieta Iba Entered By: Antonieta Iba on 07/25/2020 15:09:59

## 2020-08-01 ENCOUNTER — Other Ambulatory Visit: Payer: Self-pay

## 2020-08-01 ENCOUNTER — Encounter (HOSPITAL_BASED_OUTPATIENT_CLINIC_OR_DEPARTMENT_OTHER): Payer: Medicare HMO | Admitting: Internal Medicine

## 2020-08-01 DIAGNOSIS — Z951 Presence of aortocoronary bypass graft: Secondary | ICD-10-CM | POA: Diagnosis not present

## 2020-08-01 DIAGNOSIS — I2510 Atherosclerotic heart disease of native coronary artery without angina pectoris: Secondary | ICD-10-CM | POA: Diagnosis not present

## 2020-08-01 DIAGNOSIS — E1140 Type 2 diabetes mellitus with diabetic neuropathy, unspecified: Secondary | ICD-10-CM

## 2020-08-01 DIAGNOSIS — E11621 Type 2 diabetes mellitus with foot ulcer: Secondary | ICD-10-CM | POA: Diagnosis not present

## 2020-08-01 DIAGNOSIS — I10 Essential (primary) hypertension: Secondary | ICD-10-CM

## 2020-08-01 DIAGNOSIS — L97529 Non-pressure chronic ulcer of other part of left foot with unspecified severity: Secondary | ICD-10-CM | POA: Diagnosis not present

## 2020-08-01 NOTE — Progress Notes (Signed)
[name] ([id number]) Visit Report for 08/01/2020 Chief Complaint Document Details Patient Name: Date of Service: [name], [name]. 08/01/2020 8:00 A M Medical Record Number: [id number] Patient Account Number: [id number] Date of Birth/Sex: Treating RN: [birth year] (70 y.o. [name] Primary Care Provider: Nita [name] Other Clinician: Referring Provider: Treating Provider/Extender: [name] in Treatment: 3 Information Obtained from: Patient Chief Complaint the patient arrives for evaluation of of a wound on his first right metatarsal head, plantar aspect Electronic Signature(s) Signed: 08/01/2020 9:06:10 AM By: [name] Corwin DO Entered By: [name] on 08/01/2020 09:01:04 -------------------------------------------------------------------------------- Debridement Details Patient Name: Date of Service: [name]. 08/01/2020 8:00 A M Medical Record Number: [id number] Patient Account Number: [id number] Date of Birth/Sex: Treating RN: [birth year] (70 y.o. [name] Primary Care Provider: Nita [name] Other Clinician: Referring Provider: Treating Provider/Extender: [name] in Treatment: 3 Debridement Performed for Assessment: Wound #4 Right Metatarsal head first Performed By: Physician [name] Corwin, DO Debridement Type: Debridement Severity of Tissue Pre Debridement: Fat layer exposed Level of Consciousness (Pre-procedure): Awake and Alert Pre-procedure Verification/Time Out Yes - 08:30 Taken: Start Time: 08:31 Pain Control: Lidocaine 4% T opical Solution T Area Debrided (L x W): otal 0.5 (cm) x 0.5 (cm) = 0.25 (cm) Tissue and other material debrided: Viable, Non-Viable, Callus, Subcutaneous, Skin: Dermis , Fibrin/Exudate Level: Skin/Subcutaneous Tissue Debridement Description: Excisional Instrument: Curette Bleeding: Minimum Hemostasis Achieved: Pressure End Time: 08:33 Procedural Pain:  0 Post Procedural Pain: 0 Response to Treatment: Procedure was tolerated well Level of Consciousness (Post- Awake and Alert procedure): Post Debridement Measurements of Total Wound Length: (cm) 0.5 Width: (cm) 0.5 Depth: (cm) 0.2 Volume: (cm) 0.039 Character of Wound/Ulcer Post Debridement: Improved Severity of Tissue Post Debridement: Fat layer exposed Post Procedure Diagnosis Same as Pre-procedure Electronic Signature(s) Signed: 08/01/2020 9:06:10 AM By: [name] Corwin DO Signed: 08/01/2020 6:31:57 PM By: [name] Entered By: [name] on 08/01/2020 08:33:38 -------------------------------------------------------------------------------- HPI Details Patient Name: Date of Service: [name]. 08/01/2020 8:00 A M Medical Record Number: [id number] Patient Account Number: [id number] Date of Birth/Sex: Treating RN: 1950-06-11 (70 y.o. [name] Primary Care Provider: Nita [name] Other Clinician: Referring Provider: Treating Provider/Extender: [name] in Treatment: 3 History of Present Illness HPI Description: 07/12/19 [name] is a 70 year old male with a past medical history of insulin-dependent type 2 diabetes, essential hypertension and coronary artery disease status post CABG in 2009. He has been seen in our clinic on three separate occasions for diabetic foot wounds. Last seen in 2018 for a left fifth metatarsal head diabetic foot ulcer. This was treated with silver alginate and offloading. Other previous visits were in 2009 and 2015. Patient states that 2 months ago he noticed a blister to the bottom of his foot. He reports using a foot pedal and applying constant pressure to this area for 3 hours a day. 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 He has noticed minimal drainage to the wound bed and denies any pain, swelling or erythema to the  right foot. 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 In the past 2 months he was seen at Podiatry in [place], [place] and [facility] for this issue. He states Music therapist obtained an x-ray recently and reports it  showed no significant findings. He was started on a cream for which he can not remember the name. He states he uses this with daily dressing changes. He was not satisfied with his care at either location and has since decided to follow-up with [place]. Overall he feels well 4/14 Patient presents for his 1 week follow-up. He reports no issues in the past week. He has been using his surgical shoe and has been aggressive with offloading. He is pleased with the progress of his wound healing. He denies any drainage, increased warmth or erythema to the foot. 4/21 Patient presents for 1 week follow-up. He has been using silver alginate every other day along with a surgical shoe. He denies drainage, increased warmth or erythema to the foot. 4/28 patient presents for 1 week follow-up. He has been using silver alginate every other day along with his surgical shoe. He has try to aggressively offload the area in the past week. At this time he would like to continue with current therapy as he is pleased with the progress thus far. He would like to only do a contact cast if this were to worsen. He states that he has 5 more days off before he has to go to work. And will be trying to offload it aggressively in the next week as well. He denies drainage, increased warmth or erythema to the foot. Electronic Signature(s) Signed: 08/01/2020 9:06:10 AM By: [name] Corwin DO Entered By: [name] on 08/01/2020 09:02:30 -------------------------------------------------------------------------------- Physical Exam Details Patient Name: Date of Service: [name]. 08/01/2020 8:00 A M Medical Record Number:  [id number] Patient Account Number: [id number] Date of Birth/Sex: Treating RN: [birth year] (70 y.o. [name] Primary Care Provider: Nita [name] Other Clinician: Referring Provider: Treating Provider/Extender: [name] in Treatment: 3 Constitutional respirations regular, non-labored and within target range for patient.. Cardiovascular 2+ dorsalis pedis/posterior tibialis pulses. Psychiatric pleasant and cooperative. Notes Right first metatarsal: The plantar aspect has a small opening with undermining noted circumferentially. There is callus surrounding the wound bed. This was again debrided and healthy granulation tissue underneath. No signs of infection noted. Overall improvement from last clinic visit. Electronic Signature(s) Signed: 08/01/2020 9:06:10 AM By: [name] Corwin DO Entered By: [name] on 08/01/2020 09:03:26 -------------------------------------------------------------------------------- Physician Orders Details Patient Name: Date of Service: [name]. 08/01/2020 8:00 A M Medical Record Number: [id number] Patient Account Number: [id number] Date of Birth/Sex: Treating RN: 1950/07/01 (70 y.o. [name] Primary Care Provider: Nita [name] Other Clinician: Referring Provider: Treating Provider/Extender: [name] in Treatment: 3 Verbal / Phone Orders: No Diagnosis Coding ICD-10 Coding Code Description E11.621 Type 2 diabetes mellitus with foot ulcer E11.40 Type 2 diabetes mellitus with diabetic neuropathy, unspecified I25.10 Atherosclerotic heart disease of native coronary artery without angina pectoris I10 Essential (primary) hypertension Follow-up Appointments ppointment in 1 week. - with Dr. Mikey Bussing Return A Bathing/ Shower/ Hygiene May shower and wash wound with soap and water. - with dressing changes only. Off-Loading Wedge shoe to: - patient to wear front offloading wedge  shoe. Additional Orders / Instructions Other: - Patient to call [facility] to schedule Arterial studies. Wound Treatment Wound #4 - Metatarsal head first Wound Laterality: Right Cleanser: Soap and Water Every Other Day Discharge Instructions: May shower and wash wound with dial antibacterial soap and water prior to dressing change. Peri-Wound Care: Skin Prep Every Other Day Discharge Instructions: Use skin prep as directed Prim Dressing: KerraCel Ag Gelling Fiber  Dressing, 2x2 in (silver alginate) Every Other Day ary Discharge Instructions: Apply silver alginate to wound bed as instructed Secondary Dressing: Woven Gauze Sponges 2x2 in Every Other Day Discharge Instructions: Apply over primary dressing as directed. Secondary Dressing: Optifoam Non-Adhesive Dressing, 4x4 in Every Other Day Discharge Instructions: **Foam donut** Apply over primary dressing as directed. Secured With: Insurance underwriter, Sterile 2x75 (in/in) Every Other Day Discharge Instructions: Secure with stretch gauze as directed. Secured With: 30M Medipore H Soft Cloth Surgical T 4 x 2 (in/yd) Every Other Day ape Discharge Instructions: Secure dressing with tape as directed. Electronic Signature(s) Signed: 08/01/2020 9:06:10 AM By: [name] Corwin DO Entered By: [name] on 08/01/2020 09:03:41 -------------------------------------------------------------------------------- Problem List Details Patient Name: Date of Service: [name]. 08/01/2020 8:00 A M Medical Record Number: [id number] Patient Account Number: [id number] Date of Birth/Sex: Treating RN: 1950-11-23 (70 y.o. [name] Primary Care Provider: Nita [name] Other Clinician: Referring Provider: Treating Provider/Extender: [name] in Treatment: 3 Active Problems ICD-10 Encounter Code Description Active Date MDM Diagnosis E11.621 Type 2 diabetes mellitus with foot ulcer 07/11/2020 No  Yes E11.40 Type 2 diabetes mellitus with diabetic neuropathy, unspecified 07/11/2020 No Yes I25.10 Atherosclerotic heart disease of native coronary artery without angina pectoris 07/11/2020 No Yes I10 Essential (primary) hypertension 07/11/2020 No Yes Inactive Problems Resolved Problems Electronic Signature(s) Signed: 08/01/2020 9:06:10 AM By: [name] Corwin DO Entered By: [name] on 08/01/2020 09:00:50 -------------------------------------------------------------------------------- Progress Note Details Patient Name: Date of Service: [name]. 08/01/2020 8:00 A M Medical Record Number: [id number] Patient Account Number: [id number] Date of Birth/Sex: Treating RN: 1951/03/08 (70 y.o. [name] Primary Care Provider: Nita [name] Other Clinician: Referring Provider: Treating Provider/Extender: [name] in Treatment: 3 Subjective Chief Complaint Information obtained from Patient the patient arrives for evaluation of of a wound on his first right metatarsal head, plantar aspect History of Present Illness (HPI) 07/12/19 [name] is a 70 year old male with a past medical history of insulin-dependent type 2 diabetes, essential hypertension and coronary artery disease status post CABG in 2009. He has been seen in our clinic on three separate occasions for diabetic foot wounds. Last seen in 2018 for a left fifth metatarsal head diabetic foot ulcer. This was treated with silver alginate and offloading. Other previous visits were in 2009 and 2015. Patient states that 2 months ago he noticed a blister to the bottom of his foot. He reports using a foot pedal and applying constant pressure to this area for 3 hours a day. 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 He has noticed minimal drainage to the wound bed and denies any pain, swelling or erythema to the right  foot. 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 In the past 2 months he was seen at Podiatry in [place], [place] and [facility] for this issue. He states [employer] obtained an x-ray recently and reports it showed no significant findings. He was started on a cream for which he can not remember the name. He states he uses this with daily dressing changes. He was not satisfied with his care at either location and has since decided to follow-up with [place]. Overall he feels well 4/14 Patient presents for his 1 week follow-up. He reports no issues in the  past week. He has been using his surgical shoe and has been aggressive with offloading. He is pleased with the progress of his wound healing. He denies any drainage, increased warmth or erythema to the foot. 4/21 Patient presents for 1 week follow-up. He has been using silver alginate every other day along with a surgical shoe. He denies drainage, increased warmth or erythema to the foot. 4/28 patient presents for 1 week follow-up. He has been using silver alginate every other day along with his surgical shoe. He has try to aggressively offload the area in the past week. At this time he would like to continue with current therapy as he is pleased with the progress thus far. He would like to only do a contact cast if this were to worsen. He states that he has 5 more days off before he has to go to work. And will be trying to offload it aggressively in the next week as well. He denies drainage, increased warmth or erythema to the foot. Patient History Information obtained from Patient. Family History Cancer - Mother, Diabetes - Father,Siblings, Heart Disease - Father,Siblings, Hypertension - Mother,Father,Siblings, No family history of Hereditary Spherocytosis, Kidney Disease, Lung Disease, Seizures, Stroke, Thyroid Problems, Tuberculosis. Social History Never  smoker, Marital Status - Married, Alcohol Use - Never, Drug Use - No History, Caffeine Use - Daily - coffee. Medical History Eyes Patient has history of Cataracts - removed Denies history of Glaucoma, Optic Neuritis Ear/Nose/Mouth/Throat Denies history of Chronic sinus problems/congestion, Middle ear problems Hematologic/Lymphatic Denies history of Anemia, Hemophilia, Human Immunodeficiency Virus, Lymphedema, Sickle Cell Disease Respiratory Patient has history of Sleep Apnea Denies history of Aspiration, Asthma, Chronic Obstructive Pulmonary Disease (COPD), Pneumothorax, Tuberculosis Cardiovascular Patient has history of Coronary Artery Disease - CABG x5, Hypertension, Peripheral Arterial Disease Denies history of Angina, Arrhythmia, Congestive Heart Failure, Deep Vein Thrombosis, Hypotension, Myocardial Infarction, Peripheral Venous Disease, Phlebitis, Vasculitis Gastrointestinal Denies history of Cirrhosis , Colitis, Crohnoos, Hepatitis A, Hepatitis B, Hepatitis C Endocrine Patient has history of Type II Diabetes Denies history of Type I Diabetes Genitourinary Denies history of End Stage Renal Disease Immunological Denies history of Lupus Erythematosus, Raynaudoos, Scleroderma Integumentary (Skin) Denies history of History of Burn Musculoskeletal Denies history of Gout, Rheumatoid Arthritis, Osteoarthritis, Osteomyelitis Neurologic Patient has history of Neuropathy Denies history of Dementia, Quadriplegia, Paraplegia, Seizure Disorder Oncologic Denies history of Received Chemotherapy, Received Radiation Psychiatric Denies history of Anorexia/bulimia, Confinement Anxiety Hospitalization/Surgery History - coronoary artery bypass. - cardiac cath. - back surgery. - appendectomy. - tonsillectomy. Medical A Surgical History Notes nd Cardiovascular CVA, hyperlipidemia, carotid stenosis Gastrointestinal colon polyps Genitourinary Renal Insufficiency Integumentary (Skin) (L)  foot ulcer - Objective Constitutional respirations regular, non-labored and within target range for patient.. Vitals Time Taken: 8:07 AM, Height: 70 in, Weight: 225 lbs, BMI: 32.3, Temperature: 97.5 F, Pulse: 63 bpm, Respiratory Rate: 16 breaths/min, Blood Pressure: 124/73 mmHg, Capillary Blood Glucose: 135 mg/dl. Cardiovascular 2+ dorsalis pedis/posterior tibialis pulses. Psychiatric pleasant and cooperative. General Notes: Right first metatarsal: The plantar aspect has a small opening with undermining noted circumferentially. There is callus surrounding the wound bed. This was again debrided and healthy granulation tissue underneath. No signs of infection noted. Overall improvement from last clinic visit. Integumentary (Hair, Skin) Wound #4 status is Open. Original cause of wound was Blister. The date acquired was: 05/07/2020. The wound has been in treatment 3 weeks. The wound is located on the Right Metatarsal head first. The wound measures 0.2cm length x 0.2cm width x 0.2cm depth;  0.031cm^2 area and 0.006cm^3 volume. There is Fat Layer (Subcutaneous Tissue) exposed. There is no tunneling or undermining noted. There is a medium amount of serosanguineous drainage noted. The wound margin is well defined and not attached to the wound base. There is large (67-100%) pink granulation within the wound bed. There is no necrotic tissue within the wound bed. Assessment Active Problems ICD-10 Type 2 diabetes mellitus with foot ulcer Type 2 diabetes mellitus with diabetic neuropathy, unspecified Atherosclerotic heart disease of native coronary artery without angina pectoris Essential (primary) hypertension Patient presents today with improvement in size and appearance of the wound. Again this was debrided and healthy granulation tissue was present. I think we just continue with the current regimen of silver alginate every other day. And aggressive offloading. I am concerned when he goes back to work  that this may worsen however it is showing good progress at this time. Procedures Wound #4 Pre-procedure diagnosis of Wound #4 is a Diabetic Wound/Ulcer of the Lower Extremity located on the Right Metatarsal head first .Severity of Tissue Pre Debridement is: Fat layer exposed. There was a Excisional Skin/Subcutaneous Tissue Debridement with a total area of 0.25 sq cm performed by [name] CorwinHoffman, Mayra Jolliffe, DO. With the following instrument(s): Curette to remove Viable and Non-Viable tissue/material. Material removed includes Callus, Subcutaneous Tissue, Skin: Dermis, and Fibrin/Exudate after achieving pain control using Lidocaine 4% Topical Solution. A time out was conducted at 08:30, prior to the start of the procedure. A Minimum amount of bleeding was controlled with Pressure. The procedure was tolerated well with a pain level of 0 throughout and a pain level of 0 following the procedure. Post Debridement Measurements: 0.5cm length x 0.5cm width x 0.2cm depth; 0.039cm^3 volume. Character of Wound/Ulcer Post Debridement is improved. Severity of Tissue Post Debridement is: Fat layer exposed. Post procedure Diagnosis Wound #4: Same as Pre-Procedure Plan Follow-up Appointments: Return Appointment in 1 week. - with Dr. Mikey BussingHoffman Bathing/ Shower/ Hygiene: May shower and wash wound with soap and water. - with dressing changes only. Off-Loading: Wedge shoe to: - patient to wear front offloading wedge shoe. Additional Orders / Instructions: Other: - Patient to call [facility] to schedule Arterial studies. WOUND #4: - Metatarsal head first Wound Laterality: Right Cleanser: Soap and Water Every Other Day/ Discharge Instructions: May shower and wash wound with dial antibacterial soap and water prior to dressing change. Peri-Wound Care: Skin Prep Every Other Day/ Discharge Instructions: Use skin prep as directed Prim Dressing: KerraCel Ag Gelling Fiber Dressing, 2x2 in (silver alginate) Every Other  Day/ ary Discharge Instructions: Apply silver alginate to wound bed as instructed Secondary Dressing: Woven Gauze Sponges 2x2 in Every Other Day/ Discharge Instructions: Apply over primary dressing as directed. Secondary Dressing: Optifoam Non-Adhesive Dressing, 4x4 in Every Other Day/ Discharge Instructions: **Foam donut** Apply over primary dressing as directed. Secured With: Insurance underwriterConforming Stretch Gauze Bandage, Sterile 2x75 (in/in) Every Other Day/ Discharge Instructions: Secure with stretch gauze as directed. Secured With: 68M Medipore H Soft Cloth Surgical T 4 x 2 (in/yd) Every Other Day/ ape Discharge Instructions: Secure dressing with tape as directed. 1. Silver alginate every other day 2. In office sharp debridement 3. Aggressive offloading 4. Follow-up in 1 week Electronic Signature(s) Signed: 08/01/2020 9:06:10 AM By: [name] CorwinHoffman, Takyia Sindt DO Entered By: [name] CorwinHoffman, Patricia Perales on 08/01/2020 09:04:55 -------------------------------------------------------------------------------- HxROS Details Patient Name: Date of Service: [name] OharaBISTYGA, [name] BERT W. 08/01/2020 8:00 A M Medical Record Number: [id number] Patient Account Number: [id number] Date of Birth/Sex: Treating RN: 11/02/1950 [age] y.o. M)  [name] Primary Care Provider: Nita [name] Other Clinician: Referring Provider: Treating Provider/Extender: [name] in Treatment: 3 Information Obtained From Patient Eyes Medical History: Positive for: Cataracts - removed Negative for: Glaucoma; Optic Neuritis Ear/Nose/Mouth/Throat Medical History: Negative for: Chronic sinus problems/congestion; Middle ear problems Hematologic/Lymphatic Medical History: Negative for: Anemia; Hemophilia; Human Immunodeficiency Virus; Lymphedema; Sickle Cell Disease Respiratory Medical History: Positive for: Sleep Apnea Negative for: Aspiration; Asthma; Chronic Obstructive Pulmonary Disease (COPD); Pneumothorax;  Tuberculosis Cardiovascular Medical History: Positive for: Coronary Artery Disease - CABG x5; Hypertension; Peripheral Arterial Disease Negative for: Angina; Arrhythmia; Congestive Heart Failure; Deep Vein Thrombosis; Hypotension; Myocardial Infarction; Peripheral Venous Disease; Phlebitis; Vasculitis Past Medical History Notes: CVA, hyperlipidemia, carotid stenosis Gastrointestinal Medical History: Negative for: Cirrhosis ; Colitis; Crohns; Hepatitis A; Hepatitis B; Hepatitis C Past Medical History Notes: colon polyps Endocrine Medical History: Positive for: Type II Diabetes Negative for: Type I Diabetes Time with diabetes: over 10 years Treated with: Insulin, Oral agents Blood sugar tested every day: Yes Tested : 2-3 times a day Blood sugar testing results: Breakfast: 160 Genitourinary Medical History: Negative for: End Stage Renal Disease Past Medical History Notes: Renal Insufficiency Immunological Medical History: Negative for: Lupus Erythematosus; Raynauds; Scleroderma Integumentary (Skin) Medical History: Negative for: History of Burn Past Medical History Notes: (L) foot ulcer - Musculoskeletal Medical History: Negative for: Gout; Rheumatoid Arthritis; Osteoarthritis; Osteomyelitis Neurologic Medical History: Positive for: Neuropathy Negative for: Dementia; Quadriplegia; Paraplegia; Seizure Disorder Oncologic Medical History: Negative for: Received Chemotherapy; Received Radiation Psychiatric Medical History: Negative for: Anorexia/bulimia; Confinement Anxiety HBO Extended History Items Eyes: Cataracts Immunizations Pneumococcal Vaccine: Received Pneumococcal Vaccination: No Implantable Devices No devices added Hospitalization / Surgery History Type of Hospitalization/Surgery coronoary artery bypass cardiac cath back surgery appendectomy tonsillectomy Family and Social History Cancer: Yes - Mother; Diabetes: Yes - Father,Siblings; Heart Disease:  Yes - Father,Siblings; Hereditary Spherocytosis: No; Hypertension: Yes - Mother,Father,Siblings; Kidney Disease: No; Lung Disease: No; Seizures: No; Stroke: No; Thyroid Problems: No; Tuberculosis: No; Never smoker; Marital Status - Married; Alcohol Use: Never; Drug Use: No History; Caffeine Use: Daily - coffee; Financial Concerns: No; Food, Clothing or Shelter Needs: No; Support System Lacking: No; Transportation Concerns: No Electronic Signature(s) Signed: 08/01/2020 9:06:10 AM By: [name] Corwin DO Signed: 08/01/2020 6:31:57 PM By: [name] Entered By: [name] on 08/01/2020 09:02:39 -------------------------------------------------------------------------------- SuperBill Details Patient Name: Date of Service: [name]. 08/01/2020 Medical Record Number: [id number] Patient Account Number: [id number] Date of Birth/Sex: Treating RN: [birth year] (69 y.o. [name] Primary Care Provider: Nita [name] Other Clinician: Referring Provider: Treating Provider/Extender: [name] in Treatment: 3 Diagnosis Coding ICD-10 Codes Code Description [phone] Type 2 diabetes mellitus with foot ulcer E11.40 Type 2 diabetes mellitus with diabetic neuropathy, unspecified I25.10 Atherosclerotic heart disease of native coronary artery without angina pectoris I10 Essential (primary) hypertension Facility Procedures CPT4 Code: 29562130 Description: 11042 - DEB SUBQ TISSUE 20 SQ CM/< ICD-10 Diagnosis Description E11.621 Type 2 diabetes mellitus with foot ulcer Modifier: Quantity: 1 Electronic Signature(s) Signed: 08/01/2020 9:06:10 AM By: [name] Corwin DO Entered By: [name] on 08/01/2020 09:05:43

## 2020-08-02 ENCOUNTER — Encounter (HOSPITAL_BASED_OUTPATIENT_CLINIC_OR_DEPARTMENT_OTHER): Payer: Medicare HMO | Admitting: Internal Medicine

## 2020-08-02 NOTE — Progress Notes (Signed)
CARMINE, CARROZZA ([id number]) Visit Report for 08/01/2020 Arrival Information Details Patient Name: Date of Service: PAXTON, BINNS. 08/01/2020 8:00 A M Medical Record Number: [id number] Patient Account Number: [id number] Date of Birth/Sex: Treating RN: 11-27-1950 (70 y.o. Hessie Diener Primary Care Iren Whipp: Allyn Kenner Other Clinician: Referring Otha Rickles: Treating Donna Snooks/Extender: Dierdre Harness in Treatment: 3 Visit Information History Since Last Visit Added or deleted any medications: No Patient Arrived: Ambulatory Any new allergies or adverse reactions: No Arrival Time: 08:04 Had a fall or experienced change in No Accompanied By: self activities of daily living that may affect Transfer Assistance: None risk of falls: Patient Identification Verified: Yes Signs or symptoms of abuse/neglect since last visito No Secondary Verification Process Completed: Yes Hospitalized since last visit: No Patient Requires Transmission-Based Precautions: No Implantable device outside of the clinic excluding No Patient Has Alerts: Yes cellular tissue based products placed in the center Patient Alerts: Patient on Blood Thinner since last visit: R ABI non compressible Has Dressing in Place as Prescribed: Yes Pain Present Now: No Electronic Signature(s) Signed: 08/01/2020 10:36:55 AM By: Sandre Kitty Entered By: Sandre Kitty on 08/01/2020 08:06:43 -------------------------------------------------------------------------------- Encounter Discharge Information Details Patient Name: Date of Service: [name]. 08/01/2020 8:00 A M Medical Record Number: [id number] Patient Account Number: [id number] Date of Birth/Sex: Treating RN: 10-15-1950 (70 y.o. Ernestene Mention Primary Care Melburn Treiber: Allyn Kenner Other Clinician: Referring Sylver Vantassell: Treating Brennen Gardiner/Extender: Dierdre Harness in Treatment: 3 Encounter Discharge Information Items  Post Procedure Vitals Discharge Condition: Stable Temperature (F): 97.5 Ambulatory Status: Ambulatory Pulse (bpm): 63 Discharge Destination: Home Respiratory Rate (breaths/min): 18 Transportation: Private Auto Blood Pressure (mmHg): 124/73 Accompanied By: self Schedule Follow-up Appointment: Yes Clinical Summary of Care: Patient Declined Electronic Signature(s) Signed: 08/01/2020 5:26:54 PM By: Baruch Gouty RN, BSN Entered By: Baruch Gouty on 08/01/2020 08:47:28 -------------------------------------------------------------------------------- Lower Extremity Assessment Details Patient Name: Date of Service: [name]. 08/01/2020 8:00 A M Medical Record Number: [id number] Patient Account Number: [id number] Date of Birth/Sex: Treating RN: [birth year] (70 y.o. Hessie Diener Primary Care Jalynn Waddell: Allyn Kenner Other Clinician: Referring Temprence Rhines: Treating Mirtha Jain/Extender: Dierdre Harness in Treatment: 3 Edema Assessment Assessed: [Left: No] [Right: Yes] Edema: [Left: Ye] [Right: s] Calf Left: Right: Point of Measurement: 32 cm From Medial Instep 43.5 cm Ankle Left: Right: Point of Measurement: 11 cm From Medial Instep 24.5 cm Vascular Assessment Pulses: Dorsalis Pedis Palpable: [Right:Yes] Electronic Signature(s) Signed: 08/01/2020 6:31:57 PM By: Deon Pilling Entered By: Deon Pilling on 08/01/2020 08:15:47 -------------------------------------------------------------------------------- Multi Wound Chart Details Patient Name: Date of Service: [name]. 08/01/2020 8:00 A M Medical Record Number: [id number] Patient Account Number: [id number] Date of Birth/Sex: Treating RN: 12-13-1950 (70 y.o. Hessie Diener Primary Care Rosela Supak: Allyn Kenner Other Clinician: Referring Fatiha Guzy: Treating Anetria Harwick/Extender: Dierdre Harness in Treatment: 3 Vital Signs Height(in): 70 Capillary Blood Glucose(mg/dl):  135 Weight(lbs): 225 Pulse(bpm): 61 Body Mass Index(BMI): 32 Blood Pressure(mmHg): 124/73 Temperature(F): 97.5 Respiratory Rate(breaths/min): 16 Photos: [4:No Photos Right Metatarsal head first] [N/A:N/A N/A] Wound Location: [4:Blister] [N/A:N/A] Wounding Event: [4:Diabetic Wound/Ulcer of the Lower] [N/A:N/A] Primary Etiology: [4:Extremity Cataracts, Sleep Apnea, Coronary] [N/A:N/A] Comorbid History: [4:Artery Disease, Hypertension, Peripheral Arterial Disease, Type II Diabetes, Neuropathy 05/07/2020] [N/A:N/A] Date Acquired: [4:3] [N/A:N/A] Weeks of Treatment: [4:Open] [N/A:N/A] Wound Status: [4:0.2x0.2x0.2] [N/A:N/A] Measurements L x W x D (cm) [4:0.031] [N/A:N/A] A (cm) : rea [4:0.006] [N/A:N/A] Volume (cm) : [4:50.80%] [N/A:N/A] % Reduction in A rea: [4:68.40%] [  N/A:N/A] % Reduction in Volume: [4:Grade 2] [N/A:N/A] Classification: [4:Medium] [N/A:N/A] Exudate A mount: [4:Serosanguineous] [N/A:N/A] Exudate Type: [4:red, brown] [N/A:N/A] Exudate Color: [4:Well defined, not attached] [N/A:N/A] Wound Margin: [4:Large (67-100%)] [N/A:N/A] Granulation A mount: [4:Pink] [N/A:N/A] Granulation Quality: [4:None Present (0%)] [N/A:N/A] Necrotic A mount: [4:Fat Layer (Subcutaneous Tissue): Yes N/A] Exposed Structures: [4:Fascia: No Tendon: No Muscle: No Joint: No Bone: No Large (67-100%)] [N/A:N/A] Epithelialization: [4:Debridement - Excisional] [N/A:N/A] Debridement: Pre-procedure Verification/Time Out 08:30 [N/A:N/A] Taken: [4:Lidocaine 4% Topical Solution] [N/A:N/A] Pain Control: [4:Callus, Subcutaneous] [N/A:N/A] Tissue Debrided: [4:Skin/Subcutaneous Tissue] [N/A:N/A] Level: [4:0.25] [N/A:N/A] Debridement A (sq cm): [4:rea Curette] [N/A:N/A] Instrument: [4:Minimum] [N/A:N/A] Bleeding: [4:Pressure] [N/A:N/A] Hemostasis A chieved: [4:0] [N/A:N/A] Procedural Pain: [4:0] [N/A:N/A] Post Procedural Pain: [4:Procedure was tolerated well] [N/A:N/A] Debridement Treatment Response:  [4:0.5x0.5x0.2] [N/A:N/A] Post Debridement Measurements L x W x D (cm) [4:0.039] [N/A:N/A] Post Debridement Volume: (cm) [4:Debridement] [N/A:N/A] Treatment Notes Wound #4 (Metatarsal head first) Wound Laterality: Right Cleanser Soap and Water Discharge Instruction: May shower and wash wound with dial antibacterial soap and water prior to dressing change. Peri-Wound Care Skin Prep Discharge Instruction: Use skin prep as directed Topical Primary Dressing KerraCel Ag Gelling Fiber Dressing, 2x2 in (silver alginate) Discharge Instruction: Apply silver alginate to wound bed as instructed Secondary Dressing Woven Gauze Sponges 2x2 in Discharge Instruction: Apply over primary dressing as directed. Optifoam Non-Adhesive Dressing, 4x4 in Discharge Instruction: **Foam donut** Apply over primary dressing as directed. Secured With Conforming Stretch Gauze Bandage, Sterile 2x75 (in/in) Discharge Instruction: Secure with stretch gauze as directed. 26M Medipore H Soft Cloth Surgical T 4 x 2 (in/yd) ape Discharge Instruction: Secure dressing with tape as directed. Compression Wrap Compression Stockings Add-Ons Electronic Signature(s) Signed: 08/01/2020 9:06:10 AM By: Kalman Shan DO Signed: 08/01/2020 6:31:57 PM By: Deon Pilling Entered By: Kalman Shan on 08/01/2020 09:00:56 -------------------------------------------------------------------------------- Multi-Disciplinary Care Plan Details Patient Name: Date of Service: [name]. 08/01/2020 8:00 A M Medical Record Number: [id number] Patient Account Number: [id number] Date of Birth/Sex: Treating RN: 1950-06-26 (70 y.o. Hessie Diener Primary Care Raziyah Vanvleck: Allyn Kenner Other Clinician: Referring Reeya Bound: Treating Shayden Bobier/Extender: Dierdre Harness in Treatment: 3 Multidisciplinary Care Plan reviewed with physician Active Inactive Nutrition Nursing Diagnoses: Impaired glucose control: actual or  potential Potential for alteratiion in Nutrition/Potential for imbalanced nutrition Goals: Patient/caregiver agrees to and verbalizes understanding of need to use nutritional supplements and/or vitamins as prescribed Date Initiated: 07/11/2020 Date Inactivated: 08/01/2020 Target Resolution Date: 08/09/2020 Goal Status: Met Patient/caregiver will maintain therapeutic glucose control Date Initiated: 07/11/2020 Target Resolution Date: 08/09/2020 Goal Status: Active Interventions: Assess HgA1c results as ordered upon admission and as needed Assess patient nutrition upon admission and as needed per policy Provide education on elevated blood sugars and impact on wound healing Provide education on nutrition Notes: Wound/Skin Impairment Nursing Diagnoses: Impaired tissue integrity Knowledge deficit related to ulceration/compromised skin integrity Goals: Patient/caregiver will verbalize understanding of skin care regimen Date Initiated: 07/11/2020 Date Inactivated: 08/01/2020 Target Resolution Date: 08/09/2020 Goal Status: Met Ulcer/skin breakdown will have a volume reduction of 30% by week 4 Date Initiated: 07/11/2020 Target Resolution Date: 08/09/2020 Goal Status: Active Interventions: Assess patient/caregiver ability to obtain necessary supplies Assess patient/caregiver ability to perform ulcer/skin care regimen upon admission and as needed Assess ulceration(s) every visit Provide education on ulcer and skin care Notes: Electronic Signature(s) Signed: 08/01/2020 6:31:57 PM By: Deon Pilling Entered By: Deon Pilling on 08/01/2020 08:18:51 -------------------------------------------------------------------------------- Pain Assessment Details Patient Name: Date of Service: [name]. 08/01/2020 8:00 A M  Medical Record Number: [id number] Patient Account Number: [id number] Date of Birth/Sex: Treating RN: 1950/06/30 (70 y.o. Hessie Diener Primary Care Fayette Hamada: Allyn Kenner Other  Clinician: Referring Linnet Bottari: Treating Bastien Strawser/Extender: Dierdre Harness in Treatment: 3 Active Problems Location of Pain Severity and Description of Pain Patient Has Paino No Site Locations Pain Management and Medication Current Pain Management: Electronic Signature(s) Signed: 08/01/2020 10:36:55 AM By: Sandre Kitty Signed: 08/01/2020 6:31:57 PM By: Deon Pilling Entered By: Sandre Kitty on 08/01/2020 08:07:58 -------------------------------------------------------------------------------- Patient/Caregiver Education Details Patient Name: Date of Service: Sensabaugh, RO BERT W. 4/28/2022andnbsp8:00 [place] Record Number: [id number] Patient Account Number: [id number] Date of Birth/Gender: Treating RN: April 06, 1951 (70 y.o. Hessie Diener Primary Care Physician: Allyn Kenner Other Clinician: Referring Physician: Treating Physician/Extender: Dierdre Harness in Treatment: 3 Education Assessment Education Provided To: Patient Education Topics Provided Elevated Blood Sugar/ Impact on Healing: Handouts: Elevated Blood Sugars: How Do They Affect Wound Healing Methods: Explain/Verbal Responses: Reinforcements needed Electronic Signature(s) Signed: 08/01/2020 6:31:57 PM By: Deon Pilling Entered By: Deon Pilling on 08/01/2020 08:19:00 -------------------------------------------------------------------------------- Wound Assessment Details Patient Name: Date of Service: CRISTOPHER, CICCARELLI. 08/01/2020 8:00 A M Medical Record Number: [id number] Patient Account Number: [id number] Date of Birth/Sex: Treating RN: 11/18/1950 (70 y.o. Hessie Diener Primary Care Lezlie Ritchey: Allyn Kenner Other Clinician: Referring Easter Kennebrew: Treating Aleksis Jiggetts/Extender: Dierdre Harness in Treatment: 3 Wound Status Wound Number: 4 Primary Diabetic Wound/Ulcer of the Lower Extremity Etiology: Wound Location: Right Metatarsal head first Wound  Open Wounding Event: Blister Status: Date Acquired: 05/07/2020 Comorbid Cataracts, Sleep Apnea, Coronary Artery Disease, Hypertension, Weeks Of Treatment: 3 History: Peripheral Arterial Disease, Type II Diabetes, Neuropathy Clustered Wound: No Photos Wound Measurements Length: (cm) 0.2 Width: (cm) 0.2 Depth: (cm) 0.2 Area: (cm) 0.031 Volume: (cm) 0.006 % Reduction in Area: 50.8% % Reduction in Volume: 68.4% Epithelialization: Large (67-100%) Tunneling: No Undermining: No Wound Description Classification: Grade 2 Wound Margin: Well defined, not attached Exudate Amount: Medium Exudate Type: Serosanguineous Exudate Color: red, brown Foul Odor After Cleansing: No Slough/Fibrino No Wound Bed Granulation Amount: Large (67-100%) Exposed Structure Granulation Quality: Pink Fascia Exposed: No Necrotic Amount: None Present (0%) Fat Layer (Subcutaneous Tissue) Exposed: Yes Tendon Exposed: No Muscle Exposed: No Joint Exposed: No Bone Exposed: No Treatment Notes Wound #4 (Metatarsal head first) Wound Laterality: Right Cleanser Soap and Water Discharge Instruction: May shower and wash wound with dial antibacterial soap and water prior to dressing change. Peri-Wound Care Skin Prep Discharge Instruction: Use skin prep as directed Topical Primary Dressing KerraCel Ag Gelling Fiber Dressing, 2x2 in (silver alginate) Discharge Instruction: Apply silver alginate to wound bed as instructed Secondary Dressing Woven Gauze Sponges 2x2 in Discharge Instruction: Apply over primary dressing as directed. Optifoam Non-Adhesive Dressing, 4x4 in Discharge Instruction: **Foam donut** Apply over primary dressing as directed. Secured With Conforming Stretch Gauze Bandage, Sterile 2x75 (in/in) Discharge Instruction: Secure with stretch gauze as directed. 25M Medipore H Soft Cloth Surgical T 4 x 2 (in/yd) ape Discharge Instruction: Secure dressing with tape as directed. Compression  Wrap Compression Stockings Add-Ons Electronic Signature(s) Signed: 08/01/2020 6:31:57 PM By: Deon Pilling Signed: 08/02/2020 4:35:52 PM By: Sandre Kitty Entered By: Sandre Kitty on 08/01/2020 16:22:58 -------------------------------------------------------------------------------- Vitals Details Patient Name: Date of Service: [name]. 08/01/2020 8:00 A M Medical Record Number: [id number] Patient Account Number: [id number] Date of Birth/Sex: Treating RN: 1951/01/22 (70 y.o. Hessie Diener Primary Care Daneshia Tavano: Allyn Kenner Other Clinician: Referring Drelyn Pistilli: Treating Deigo Alonso/Extender: Kalman Shan  Allyn Kenner Weeks in Treatment: 3 Vital Signs Time Taken: 08:07 Temperature (F): 97.5 Height (in): 70 Pulse (bpm): 63 Weight (lbs): 225 Respiratory Rate (breaths/min): 16 Body Mass Index (BMI): 32.3 Blood Pressure (mmHg): 124/73 Capillary Blood Glucose (mg/dl): 135 Reference Range: 80 - 120 mg / dl Electronic Signature(s) Signed: 08/01/2020 10:36:55 AM By: Sandre Kitty Entered By: Sandre Kitty on 08/01/2020 08:07:40

## 2020-08-08 ENCOUNTER — Other Ambulatory Visit: Payer: Self-pay

## 2020-08-08 ENCOUNTER — Encounter (HOSPITAL_BASED_OUTPATIENT_CLINIC_OR_DEPARTMENT_OTHER): Payer: Medicare HMO | Attending: Internal Medicine | Admitting: Internal Medicine

## 2020-08-08 DIAGNOSIS — E785 Hyperlipidemia, unspecified: Secondary | ICD-10-CM | POA: Diagnosis not present

## 2020-08-08 DIAGNOSIS — I10 Essential (primary) hypertension: Secondary | ICD-10-CM | POA: Insufficient documentation

## 2020-08-08 DIAGNOSIS — L97519 Non-pressure chronic ulcer of other part of right foot with unspecified severity: Secondary | ICD-10-CM | POA: Insufficient documentation

## 2020-08-08 DIAGNOSIS — E1140 Type 2 diabetes mellitus with diabetic neuropathy, unspecified: Secondary | ICD-10-CM | POA: Insufficient documentation

## 2020-08-08 DIAGNOSIS — G4730 Sleep apnea, unspecified: Secondary | ICD-10-CM | POA: Insufficient documentation

## 2020-08-08 DIAGNOSIS — I2510 Atherosclerotic heart disease of native coronary artery without angina pectoris: Secondary | ICD-10-CM | POA: Diagnosis not present

## 2020-08-08 DIAGNOSIS — Z833 Family history of diabetes mellitus: Secondary | ICD-10-CM | POA: Insufficient documentation

## 2020-08-08 DIAGNOSIS — E1151 Type 2 diabetes mellitus with diabetic peripheral angiopathy without gangrene: Secondary | ICD-10-CM | POA: Insufficient documentation

## 2020-08-08 DIAGNOSIS — Z794 Long term (current) use of insulin: Secondary | ICD-10-CM | POA: Diagnosis not present

## 2020-08-08 DIAGNOSIS — E11621 Type 2 diabetes mellitus with foot ulcer: Secondary | ICD-10-CM

## 2020-08-08 DIAGNOSIS — Z8249 Family history of ischemic heart disease and other diseases of the circulatory system: Secondary | ICD-10-CM | POA: Insufficient documentation

## 2020-08-08 DIAGNOSIS — Z951 Presence of aortocoronary bypass graft: Secondary | ICD-10-CM | POA: Diagnosis not present

## 2020-08-08 DIAGNOSIS — Z8673 Personal history of transient ischemic attack (TIA), and cerebral infarction without residual deficits: Secondary | ICD-10-CM | POA: Diagnosis not present

## 2020-08-08 NOTE — Progress Notes (Signed)
ARIZONA, NORDQUIST ([id number]) Visit Report for 08/08/2020 Chief Complaint Document Details Patient Name: Date of Service: [name], [name]. 08/08/2020 8:30 A M Medical Record Number: [id number] Patient Account Number: [id number] Date of Birth/Sex: Treating RN: 1950/07/16 (70 y.o. [name] Primary Care Provider: Nita Sells Other Clinician: Referring Provider: Treating Provider/Extender: Tomasita Crumble in Treatment: 4 Information Obtained from: Patient Chief Complaint the patient arrives for evaluation of a wound on his first right metatarsal head, plantar aspect Electronic Signature(s) Signed: 08/08/2020 10:10:18 AM By: Geralyn Corwin DO Entered By: Geralyn Corwin on 08/08/2020 10:02:51 -------------------------------------------------------------------------------- Debridement Details Patient Name: Date of Service: [name]. 08/08/2020 8:30 A M Medical Record Number: [id number] Patient Account Number: [id number] Date of Birth/Sex: Treating RN: 08-01-1950 (69 y.o. [name] Primary Care Provider: Nita Sells Other Clinician: Referring Provider: Treating Provider/Extender: Tomasita Crumble in Treatment: 4 Debridement Performed for Assessment: Wound #4 Right Metatarsal head first Performed By: Physician Geralyn Corwin, DO Debridement Type: Debridement Severity of Tissue Pre Debridement: Fat layer exposed Level of Consciousness (Pre-procedure): Awake and Alert Pre-procedure Verification/Time Out Yes - 09:15 Taken: Start Time: 09:16 Pain Control: Lidocaine 4% T opical Solution T Area Debrided (L x W): otal 0.3 (cm) x 0.3 (cm) = 0.09 (cm) Tissue and other material debrided: Viable, Non-Viable, Callus, Subcutaneous, Skin: Dermis , Skin: Epidermis, Fibrin/Exudate Level: Skin/Subcutaneous Tissue Debridement Description: Excisional Instrument: Curette Bleeding: Minimum Hemostasis Achieved: Pressure End Time:  09:21 Procedural Pain: 0 Post Procedural Pain: 0 Response to Treatment: Procedure was tolerated well Level of Consciousness (Post- Awake and Alert procedure): Post Debridement Measurements of Total Wound Length: (cm) 0.2 Width: (cm) 0.2 Depth: (cm) 0.1 Volume: (cm) 0.003 Character of Wound/Ulcer Post Debridement: Improved Severity of Tissue Post Debridement: Fat layer exposed Post Procedure Diagnosis Same as Pre-procedure Electronic Signature(s) Signed: 08/08/2020 10:10:18 AM By: Geralyn Corwin DO Signed: 08/08/2020 6:21:36 PM By: Shawn Stall Entered By: Shawn Stall on 08/08/2020 09:21:42 -------------------------------------------------------------------------------- HPI Details Patient Name: Date of Service: [name]. 08/08/2020 8:30 A M Medical Record Number: [id number] Patient Account Number: [id number] Date of Birth/Sex: Treating RN: 03-16-1951 (70 y.o. [name] Primary Care Provider: Nita Sells Other Clinician: Referring Provider: Treating Provider/Extender: Tomasita Crumble in Treatment: 4 History of Present Illness HPI Description: 07/12/19 [name] is a 70 year old male with a past medical history of insulin-dependent type 2 diabetes, essential hypertension and coronary artery disease status post CABG in 2009. He has been seen in our clinic on three separate occasions for diabetic foot wounds. Last seen in 2018 for a left fifth metatarsal head diabetic foot ulcer. This was treated with silver alginate and offloading. Other previous visits were in 2009 and 2015. Patient states that 2 months ago he noticed a blister to the bottom of his foot. He reports using a foot pedal and applying constant pressure to this area for 3 hours a day. 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 He has noticed minimal drainage to the wound bed and denies any pain,  swelling or erythema to the right foot. 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 In the past 2 months he was seen at Podiatry in [place], [place] and [facility] for this issue. He states Music therapist obtained an x-ray recently and reports  it showed no significant findings. He was started on a cream for which he can not remember the name. He states he uses this with daily dressing changes. He was not satisfied with his care at either location and has since decided to follow-up with [place]. Overall he feels well 4/14; Patient presents for his 1 week follow-up. He reports no issues in the past week. He has been using his surgical shoe and has been aggressive with offloading. He is pleased with the progress of his wound healing. He denies any drainage, increased warmth or erythema to the foot. 4/21; Patient presents for 1 week follow-up. He has been using silver alginate every other day along with a surgical shoe. He denies drainage, increased warmth or erythema to the foot. 4/28; patient presents for 1 week follow-up. He has been using silver alginate every other day along with his surgical shoe. He has try to aggressively offload the area in the past week. At this time he would like to continue with current therapy as he is pleased with the progress thus far. He would like to only do a contact cast if this were to worsen. He states that he has 5 more days off before he has to go to work. And will be trying to offload it aggressively in the next week as well. He denies drainage, increased warmth or erythema to the foot. 5/5; patient presents for 1 week follow-up. He started back at work and has been trying to offload the best he can. He continues to use silver alginate with his surgical shoe. He has no new issues or complaints today. He denies signs of infection. Electronic Signature(s) Signed: 08/08/2020 10:10:18 AM By:  Geralyn Corwin DO Entered By: Geralyn Corwin on 08/08/2020 10:05:21 -------------------------------------------------------------------------------- Physical Exam Details Patient Name: Date of Service: [name]. 08/08/2020 8:30 A M Medical Record Number: [id number] Patient Account Number: [id number] Date of Birth/Sex: Treating RN: Sep 23, 1950 (70 y.o. [name] Primary Care Provider: Nita Sells Other Clinician: Referring Provider: Treating Provider/Extender: Tomasita Crumble in Treatment: 4 Constitutional respirations regular, non-labored and within target range for patient.. Cardiovascular 2+ dorsalis pedis/posterior tibialis pulses. Psychiatric pleasant and cooperative. Notes Right foot: Metatarsal first head with small open wound and circumferential callus. This was removed and granulation tissue was present at the wound bed. No signs of infection. Electronic Signature(s) Signed: 08/08/2020 10:10:18 AM By: Geralyn Corwin DO Entered By: Geralyn Corwin on 08/08/2020 10:05:41 -------------------------------------------------------------------------------- Physician Orders Details Patient Name: Date of Service: [name]. 08/08/2020 8:30 A M Medical Record Number: [id number] Patient Account Number: [id number] Date of Birth/Sex: Treating RN: 1951/02/12 (70 y.o. [name] Primary Care Provider: Nita Sells Other Clinician: Referring Provider: Treating Provider/Extender: Tomasita Crumble in Treatment: 4 Verbal / Phone Orders: No Diagnosis Coding ICD-10 Coding Code Description E11.621 Type 2 diabetes mellitus with foot ulcer E11.40 Type 2 diabetes mellitus with diabetic neuropathy, unspecified I25.10 Atherosclerotic heart disease of native coronary artery without angina pectoris I10 Essential (primary) hypertension Follow-up Appointments ppointment in 1 week. - with Dr. Mikey Bussing Return A Patient to schedule  arterial studies with [facility]. Bathing/ Shower/ Hygiene May shower and wash wound with soap and water. - with dressing changes only. Off-Loading Wedge shoe to: - patient to wear front offloading wedge shoe. Wound Treatment Wound #4 - Metatarsal head first Wound Laterality: Right Cleanser: Soap and Water Every Other Day Discharge Instructions: May shower and wash wound with dial antibacterial soap and  water prior to dressing change. Peri-Wound Care: Skin Prep Every Other Day Discharge Instructions: Use skin prep as directed Prim Dressing: KerraCel Ag Gelling Fiber Dressing, 2x2 in (silver alginate) Every Other Day ary Discharge Instructions: Apply silver alginate to wound bed as instructed Secondary Dressing: Woven Gauze Sponges 2x2 in Every Other Day Discharge Instructions: Apply over primary dressing as directed. Secondary Dressing: Optifoam Non-Adhesive Dressing, 4x4 in Every Other Day Discharge Instructions: **Foam donut** Apply over primary dressing as directed. Secured With: Insurance underwriter, Sterile 2x75 (in/in) Every Other Day Discharge Instructions: Secure with stretch gauze as directed. Secured With: 91M Medipore H Soft Cloth Surgical T 4 x 2 (in/yd) Every Other Day ape Discharge Instructions: Secure dressing with tape as directed. Electronic Signature(s) Signed: 08/08/2020 10:10:18 AM By: Geralyn Corwin DO Entered By: Geralyn Corwin on 08/08/2020 10:05:55 -------------------------------------------------------------------------------- Problem List Details Patient Name: Date of Service: [name]. 08/08/2020 8:30 A M Medical Record Number: [id number] Patient Account Number: [id number] Date of Birth/Sex: Treating RN: Aug 22, 1950 (70 y.o. [name] Primary Care Provider: Nita Sells Other Clinician: Referring Provider: Treating Provider/Extender: Tomasita Crumble in Treatment: 4 Active  Problems ICD-10 Encounter Code Description Active Date MDM Diagnosis E11.621 Type 2 diabetes mellitus with foot ulcer 07/11/2020 No Yes E11.40 Type 2 diabetes mellitus with diabetic neuropathy, unspecified 07/11/2020 No Yes I25.10 Atherosclerotic heart disease of native coronary artery without angina pectoris 07/11/2020 No Yes I10 Essential (primary) hypertension 07/11/2020 No Yes Inactive Problems Resolved Problems Electronic Signature(s) Signed: 08/08/2020 10:10:18 AM By: Geralyn Corwin DO Entered By: Geralyn Corwin on 08/08/2020 10:02:22 -------------------------------------------------------------------------------- Progress Note Details Patient Name: Date of Service: [name]. 08/08/2020 8:30 A M Medical Record Number: [id number] Patient Account Number: [id number] Date of Birth/Sex: Treating RN: 02-20-1951 (70 y.o. [name] Primary Care Provider: Nita Sells Other Clinician: Referring Provider: Treating Provider/Extender: Tomasita Crumble in Treatment: 4 Subjective Chief Complaint Information obtained from Patient the patient arrives for evaluation of a wound on his first right metatarsal head, plantar aspect History of Present Illness (HPI) 07/12/19 [name] is a 70 year old male with a past medical history of insulin-dependent type 2 diabetes, essential hypertension and coronary artery disease status post CABG in 2009. He has been seen in our clinic on three separate occasions for diabetic foot wounds. Last seen in 2018 for a left fifth metatarsal head diabetic foot ulcer. This was treated with silver alginate and offloading. Other previous visits were in 2009 and 2015. Patient states that 2 months ago he noticed a blister to the bottom of his foot. He reports using a foot pedal and applying constant pressure to this area for 3 hours a day. 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 He has noticed minimal drainage to the wound bed and denies any pain, swelling or erythema to the right foot. 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 In the past 2 months he was seen at Podiatry in [place], [place] and [facility] for this issue. He states [employer] obtained an x-ray recently and reports it showed no significant findings. He was started on a cream for which he can not remember the name. He states he uses this with daily dressing changes. He was not satisfied with his care at either location and has  since decided to follow-up with [place]. Overall he feels well 4/14; Patient presents for his 1 week follow-up. He reports no issues in the past week. He has been using his surgical shoe and has been aggressive with offloading. He is pleased with the progress of his wound healing. He denies any drainage, increased warmth or erythema to the foot. 4/21; Patient presents for 1 week follow-up. He has been using silver alginate every other day along with a surgical shoe. He denies drainage, increased warmth or erythema to the foot. 4/28; patient presents for 1 week follow-up. He has been using silver alginate every other day along with his surgical shoe. He has try to aggressively offload the area in the past week. At this time he would like to continue with current therapy as he is pleased with the progress thus far. He would like to only do a contact cast if this were to worsen. He states that he has 5 more days off before he has to go to work. And will be trying to offload it aggressively in the next week as well. He denies drainage, increased warmth or erythema to the foot. 5/5; patient presents for 1 week follow-up. He started back at work and has been trying to offload the best he can. He continues to use silver alginate with his surgical shoe. He has no new issues or complaints  today. He denies signs of infection. Patient History Information obtained from Patient. Family History Cancer - Mother, Diabetes - Father,Siblings, Heart Disease - Father,Siblings, Hypertension - Mother,Father,Siblings, No family history of Hereditary Spherocytosis, Kidney Disease, Lung Disease, Seizures, Stroke, Thyroid Problems, Tuberculosis. Social History Never smoker, Marital Status - Married, Alcohol Use - Never, Drug Use - No History, Caffeine Use - Daily - coffee. Medical History Eyes Patient has history of Cataracts - removed Denies history of Glaucoma, Optic Neuritis Ear/Nose/Mouth/Throat Denies history of Chronic sinus problems/congestion, Middle ear problems Hematologic/Lymphatic Denies history of Anemia, Hemophilia, Human Immunodeficiency Virus, Lymphedema, Sickle Cell Disease Respiratory Patient has history of Sleep Apnea Denies history of Aspiration, Asthma, Chronic Obstructive Pulmonary Disease (COPD), Pneumothorax, Tuberculosis Cardiovascular Patient has history of Coronary Artery Disease - CABG x5, Hypertension, Peripheral Arterial Disease Denies history of Angina, Arrhythmia, Congestive Heart Failure, Deep Vein Thrombosis, Hypotension, Myocardial Infarction, Peripheral Venous Disease, Phlebitis, Vasculitis Gastrointestinal Denies history of Cirrhosis , Colitis, Crohnoos, Hepatitis A, Hepatitis B, Hepatitis C Endocrine Patient has history of Type II Diabetes Denies history of Type I Diabetes Genitourinary Denies history of End Stage Renal Disease Immunological Denies history of Lupus Erythematosus, Raynaudoos, Scleroderma Integumentary (Skin) Denies history of History of Burn Musculoskeletal Denies history of Gout, Rheumatoid Arthritis, Osteoarthritis, Osteomyelitis Neurologic Patient has history of Neuropathy Denies history of Dementia, Quadriplegia, Paraplegia, Seizure Disorder Oncologic Denies history of Received Chemotherapy, Received  Radiation Psychiatric Denies history of Anorexia/bulimia, Confinement Anxiety Hospitalization/Surgery History - coronoary artery bypass. - cardiac cath. - back surgery. - appendectomy. - tonsillectomy. Medical A Surgical History Notes nd Cardiovascular CVA, hyperlipidemia, carotid stenosis Gastrointestinal colon polyps Genitourinary Renal Insufficiency Integumentary (Skin) (L) foot ulcer - Objective Constitutional respirations regular, non-labored and within target range for patient.. Vitals Time Taken: 8:35 AM, Height: 70 in, Weight: 225 lbs, BMI: 32.3, Temperature: 98.4 F, Pulse: 77 bpm, Respiratory Rate: 16 breaths/min, Blood Pressure: 91/56 mmHg, Capillary Blood Glucose: 109 mg/dl. Cardiovascular 2+ dorsalis pedis/posterior tibialis pulses. Psychiatric pleasant and cooperative. General Notes: Right foot: Metatarsal first head with small open wound and circumferential callus. This was removed and granulation tissue was present  at the wound bed. No signs of infection. Integumentary (Hair, Skin) Wound #4 status is Open. Original cause of wound was Blister. The date acquired was: 05/07/2020. The wound has been in treatment 4 weeks. The wound is located on the Right Metatarsal head first. The wound measures 0.1cm length x 0.1cm width x 0.1cm depth; 0.008cm^2 area and 0.001cm^3 volume. There is Fat Layer (Subcutaneous Tissue) exposed. There is no tunneling or undermining noted. There is a medium amount of serosanguineous drainage noted. The wound margin is well defined and not attached to the wound base. There is large (67-100%) pink granulation within the wound bed. There is no necrotic tissue within the wound bed. General Notes: callous periwound. Assessment Active Problems ICD-10 Type 2 diabetes mellitus with foot ulcer Type 2 diabetes mellitus with diabetic neuropathy, unspecified Atherosclerotic heart disease of native coronary artery without angina pectoris Essential  (primary) hypertension Overall the patient is doing well the Appearance of the wound continues to improve. I am concerned now that he is at work and he may not be able to aggressively offload like he has in the past few weeks. He is improving however. We will continue with the same therapy of silver Alginate and offloading felt pad. Procedures Wound #4 Pre-procedure diagnosis of Wound #4 is a Diabetic Wound/Ulcer of the Lower Extremity located on the Right Metatarsal head first .Severity of Tissue Pre Debridement is: Fat layer exposed. There was a Excisional Skin/Subcutaneous Tissue Debridement with a total area of 0.09 sq cm performed by Geralyn Corwin, DO. With the following instrument(s): Curette to remove Viable and Non-Viable tissue/material. Material removed includes Callus, Subcutaneous Tissue, Skin: Dermis, Skin: Epidermis, and Fibrin/Exudate after achieving pain control using Lidocaine 4% Topical Solution. A time out was conducted at 09:15, prior to the start of the procedure. A Minimum amount of bleeding was controlled with Pressure. The procedure was tolerated well with a pain level of 0 throughout and a pain level of 0 following the procedure. Post Debridement Measurements: 0.2cm length x 0.2cm width x 0.1cm depth; 0.003cm^3 volume. Character of Wound/Ulcer Post Debridement is improved. Severity of Tissue Post Debridement is: Fat layer exposed. Post procedure Diagnosis Wound #4: Same as Pre-Procedure Plan Follow-up Appointments: Return Appointment in 1 week. - with Dr. Mikey Bussing Patient to schedule arterial studies with [facility]. Bathing/ Shower/ Hygiene: May shower and wash wound with soap and water. - with dressing changes only. Off-Loading: Wedge shoe to: - patient to wear front offloading wedge shoe. WOUND #4: - Metatarsal head first Wound Laterality: Right Cleanser: Soap and Water Every Other Day/ Discharge Instructions: May shower and wash wound with dial  antibacterial soap and water prior to dressing change. Peri-Wound Care: Skin Prep Every Other Day/ Discharge Instructions: Use skin prep as directed Prim Dressing: KerraCel Ag Gelling Fiber Dressing, 2x2 in (silver alginate) Every Other Day/ ary Discharge Instructions: Apply silver alginate to wound bed as instructed Secondary Dressing: Woven Gauze Sponges 2x2 in Every Other Day/ Discharge Instructions: Apply over primary dressing as directed. Secondary Dressing: Optifoam Non-Adhesive Dressing, 4x4 in Every Other Day/ Discharge Instructions: **Foam donut** Apply over primary dressing as directed. Secured With: Insurance underwriter, Sterile 2x75 (in/in) Every Other Day/ Discharge Instructions: Secure with stretch gauze as directed. Secured With: 35M Medipore H Soft Cloth Surgical T 4 x 2 (in/yd) Every Other Day/ ape Discharge Instructions: Secure dressing with tape as directed. 1. Silver alginate 2. Aggressive offloading 3. 1 week follow-up 4. In office sharp debridement Electronic Signature(s) Signed: 08/08/2020  10:10:18 AM By: Geralyn Corwin DO Entered By: Geralyn Corwin on 08/08/2020 10:08:20 -------------------------------------------------------------------------------- HxROS Details Patient Name: Date of Service: [place] W. 08/08/2020 8:30 A M Medical Record Number: [id number] Patient Account Number: [id number] Date of Birth/Sex: Treating RN: 1950-04-28 (70 y.o. [name] Primary Care Provider: Nita Sells Other Clinician: Referring Provider: Treating Provider/Extender: Tomasita Crumble in Treatment: 4 Information Obtained From Patient Eyes Medical History: Positive for: Cataracts - removed Negative for: Glaucoma; Optic Neuritis Ear/Nose/Mouth/Throat Medical History: Negative for: Chronic sinus problems/congestion; Middle ear problems Hematologic/Lymphatic Medical History: Negative for: Anemia; Hemophilia; Human  Immunodeficiency Virus; Lymphedema; Sickle Cell Disease Respiratory Medical History: Positive for: Sleep Apnea Negative for: Aspiration; Asthma; Chronic Obstructive Pulmonary Disease (COPD); Pneumothorax; Tuberculosis Cardiovascular Medical History: Positive for: Coronary Artery Disease - CABG x5; Hypertension; Peripheral Arterial Disease Negative for: Angina; Arrhythmia; Congestive Heart Failure; Deep Vein Thrombosis; Hypotension; Myocardial Infarction; Peripheral Venous Disease; Phlebitis; Vasculitis Past Medical History Notes: CVA, hyperlipidemia, carotid stenosis Gastrointestinal Medical History: Negative for: Cirrhosis ; Colitis; Crohns; Hepatitis A; Hepatitis B; Hepatitis C Past Medical History Notes: colon polyps Endocrine Medical History: Positive for: Type II Diabetes Negative for: Type I Diabetes Time with diabetes: over 10 years Treated with: Insulin, Oral agents Blood sugar tested every day: Yes Tested : 2-3 times a day Blood sugar testing results: Breakfast: 160 Genitourinary Medical History: Negative for: End Stage Renal Disease Past Medical History Notes: Renal Insufficiency Immunological Medical History: Negative for: Lupus Erythematosus; Raynauds; Scleroderma Integumentary (Skin) Medical History: Negative for: History of Burn Past Medical History Notes: (L) foot ulcer - Musculoskeletal Medical History: Negative for: Gout; Rheumatoid Arthritis; Osteoarthritis; Osteomyelitis Neurologic Medical History: Positive for: Neuropathy Negative for: Dementia; Quadriplegia; Paraplegia; Seizure Disorder Oncologic Medical History: Negative for: Received Chemotherapy; Received Radiation Psychiatric Medical History: Negative for: Anorexia/bulimia; Confinement Anxiety HBO Extended History Items Eyes: Cataracts Immunizations Pneumococcal Vaccine: Received Pneumococcal Vaccination: No Implantable Devices No devices added Hospitalization / Surgery  History Type of Hospitalization/Surgery coronoary artery bypass cardiac cath back surgery appendectomy tonsillectomy Family and Social History Cancer: Yes - Mother; Diabetes: Yes - Father,Siblings; Heart Disease: Yes - Father,Siblings; Hereditary Spherocytosis: No; Hypertension: Yes - Mother,Father,Siblings; Kidney Disease: No; Lung Disease: No; Seizures: No; Stroke: No; Thyroid Problems: No; Tuberculosis: No; Never smoker; Marital Status - Married; Alcohol Use: Never; Drug Use: No History; Caffeine Use: Daily - coffee; Financial Concerns: No; Food, Clothing or Shelter Needs: No; Support System Lacking: No; Transportation Concerns: No Electronic Signature(s) Signed: 08/08/2020 10:10:18 AM By: Geralyn Corwin DO Signed: 08/08/2020 6:21:36 PM By: Shawn Stall Entered By: Geralyn Corwin on 08/08/2020 10:05:30 -------------------------------------------------------------------------------- SuperBill Details Patient Name: Date of Service: [name]. 08/08/2020 Medical Record Number: [id number] Patient Account Number: [id number] Date of Birth/Sex: Treating RN: 1950-08-29 (70 y.o. [name] Primary Care Provider: Nita Sells Other Clinician: Referring Provider: Treating Provider/Extender: Tomasita Crumble in Treatment: 4 Diagnosis Coding ICD-10 Codes Code Description [phone] Type 2 diabetes mellitus with foot ulcer E11.40 Type 2 diabetes mellitus with diabetic neuropathy, unspecified I25.10 Atherosclerotic heart disease of native coronary artery without angina pectoris I10 Essential (primary) hypertension Facility Procedures CPT4 Code: 32440102 Description: 11042 - DEB SUBQ TISSUE 20 SQ CM/< ICD-10 Diagnosis Description E11.621 Type 2 diabetes mellitus with foot ulcer Modifier: Quantity: 1 Physician Procedures : CPT4 Code Description Modifier [id number] 11042 - WC PHYS SUBQ TISS 20 SQ CM ICD-10 Diagnosis Description E11.621 Type 2 diabetes mellitus with  foot ulcer Quantity: 1 Electronic Signature(s) Signed: 08/08/2020 10:10:18 AM By:  Geralyn CorwinHoffman, Rakeen Gaillard DO Entered By: Geralyn CorwinHoffman, Tiarna Koppen on 08/08/2020 10:09:54

## 2020-08-09 NOTE — Progress Notes (Signed)
SAYLOR, SHECKLER ([id number]) Visit Report for 08/08/2020 Arrival Information Details Patient Name: Date of Service: JAMORIAN, DIMARIA. 08/08/2020 8:30 A M Medical Record Number: [id number] Patient Account Number: [id number] Date of Birth/Sex: Treating RN: 1951-03-30 (70 y.o. Hessie Diener Primary Care Addasyn Mcbreen: Allyn Kenner Other Clinician: Referring Urania Pearlman: Treating Joevon Holliman/Extender: Dierdre Harness in Treatment: 4 Visit Information History Since Last Visit Added or deleted any medications: No Patient Arrived: Ambulatory Any new allergies or adverse reactions: No Arrival Time: 08:34 Had a fall or experienced change in No Accompanied By: self activities of daily living that may affect Transfer Assistance: None risk of falls: Patient Identification Verified: Yes Signs or symptoms of abuse/neglect since last visito No Secondary Verification Process Completed: Yes Hospitalized since last visit: No Patient Requires Transmission-Based Precautions: No Implantable device outside of the clinic excluding No Patient Has Alerts: Yes cellular tissue based products placed in the center Patient Alerts: Patient on Blood Thinner since last visit: R ABI non compressible Has Dressing in Place as Prescribed: Yes Pain Present Now: No Electronic Signature(s) Signed: 08/08/2020 9:47:30 AM By: Sandre Kitty Entered By: Sandre Kitty on 08/08/2020 08:35:38 -------------------------------------------------------------------------------- Encounter Discharge Information Details Patient Name: Date of Service: [name]. 08/08/2020 8:30 A M Medical Record Number: [id number] Patient Account Number: [id number] Date of Birth/Sex: Treating RN: 1950/08/15 (70 y.o. Ernestene Mention Primary Care Cortland Crehan: Allyn Kenner Other Clinician: Referring Theus Espin: Treating Heran Campau/Extender: Dierdre Harness in Treatment: 4 Encounter Discharge Information Items Post  Procedure Vitals Discharge Condition: Stable Temperature (F): 98.4 Ambulatory Status: Ambulatory Pulse (bpm): 77 Discharge Destination: Home Respiratory Rate (breaths/min): 18 Transportation: Private Auto Blood Pressure (mmHg): 91/56 Accompanied By: self Schedule Follow-up Appointment: Yes Clinical Summary of Care: Patient Declined Electronic Signature(s) Signed: 08/09/2020 5:11:03 PM By: Baruch Gouty RN, BSN Entered By: Baruch Gouty on 08/08/2020 09:51:34 -------------------------------------------------------------------------------- Lower Extremity Assessment Details Patient Name: Date of Service: [name]. 08/08/2020 8:30 A M Medical Record Number: [id number] Patient Account Number: [id number] Date of Birth/Sex: Treating RN: 21-Sep-1950 (70 y.o. Hessie Diener Primary Care Merville Hijazi: Allyn Kenner Other Clinician: Referring Aerial Dilley: Treating Rafan Sanders/Extender: Dierdre Harness in Treatment: 4 Edema Assessment Assessed: [Left: No] [Right: Yes] Edema: [Left: Ye] [Right: s] Calf Left: Right: Point of Measurement: 32 cm From Medial Instep 44.3 cm Ankle Left: Right: Point of Measurement: 11 cm From Medial Instep 28 cm Vascular Assessment Pulses: Dorsalis Pedis Palpable: [Right:Yes] Electronic Signature(s) Signed: 08/08/2020 6:21:36 PM By: Deon Pilling Entered By: Deon Pilling on 08/08/2020 08:50:52 -------------------------------------------------------------------------------- Multi Wound Chart Details Patient Name: Date of Service: [name]. 08/08/2020 8:30 A M Medical Record Number: [id number] Patient Account Number: [id number] Date of Birth/Sex: Treating RN: 1951/01/04 (69 y.o. Hessie Diener Primary Care Cortez Steelman: Allyn Kenner Other Clinician: Referring Zebulon Gantt: Treating Ladeidra Borys/Extender: Dierdre Harness in Treatment: 4 Vital Signs Height(in): 70 Capillary Blood Glucose(mg/dl): 109 Weight(lbs):  225 Pulse(bpm): 42 Body Mass Index(BMI): 32 Blood Pressure(mmHg): 91/56 Temperature(F): 98.4 Respiratory Rate(breaths/min): 16 Photos: [4:No Photos Right Metatarsal head first] [N/A:N/A N/A] Wound Location: [4:Blister] [N/A:N/A] Wounding Event: [4:Diabetic Wound/Ulcer of the Lower] [N/A:N/A] Primary Etiology: [4:Extremity Cataracts, Sleep Apnea, Coronary] [N/A:N/A] Comorbid History: [4:Artery Disease, Hypertension, Peripheral Arterial Disease, Type II Diabetes, Neuropathy 05/07/2020] [N/A:N/A] Date Acquired: [4:4] [N/A:N/A] Weeks of Treatment: [4:Open] [N/A:N/A] Wound Status: [4:0.1x0.1x0.1] [N/A:N/A] Measurements L x W x D (cm) [4:0.008] [N/A:N/A] A (cm) : rea [4:0.001] [N/A:N/A] Volume (cm) : [4:87.30%] [N/A:N/A] % Reduction in A rea: [4:94.70%] [  N/A:N/A] % Reduction in Volume: [4:Grade 2] [N/A:N/A] Classification: [4:Medium] [N/A:N/A] Exudate A mount: [4:Serosanguineous] [N/A:N/A] Exudate Type: [4:red, brown] [N/A:N/A] Exudate Color: [4:Well defined, not attached] [N/A:N/A] Wound Margin: [4:Large (67-100%)] [N/A:N/A] Granulation A mount: [4:Pink] [N/A:N/A] Granulation Quality: [4:None Present (0%)] [N/A:N/A] Necrotic A mount: [4:Fat Layer (Subcutaneous Tissue): Yes N/A] Exposed Structures: [4:Fascia: No Tendon: No Muscle: No Joint: No Bone: No Large (67-100%)] [N/A:N/A] Epithelialization: [4:Debridement - Excisional] [N/A:N/A] Debridement: Pre-procedure Verification/Time Out 09:15 [N/A:N/A] Taken: [4:Lidocaine 4% Topical Solution] [N/A:N/A] Pain Control: [4:Callus, Subcutaneous] [N/A:N/A] Tissue Debrided: [4:Skin/Subcutaneous Tissue] [N/A:N/A] Level: [4:0.09] [N/A:N/A] Debridement A (sq cm): [4:rea Curette] [N/A:N/A] Instrument: [4:Minimum] [N/A:N/A] Bleeding: [4:Pressure] [N/A:N/A] Hemostasis A chieved: [4:0] [N/A:N/A] Procedural Pain: [4:0] [N/A:N/A] Post Procedural Pain: [4:Procedure was tolerated well] [N/A:N/A] Debridement Treatment Response: [4:0.2x0.2x0.1]  [N/A:N/A] Post Debridement Measurements L x W x D (cm) [4:0.003] [N/A:N/A] Post Debridement Volume: (cm) [4:callous periwound.] [N/A:N/A] Assessment Notes: [4:Debridement] [N/A:N/A] Treatment Notes Wound #4 (Metatarsal head first) Wound Laterality: Right Cleanser Soap and Water Discharge Instruction: May shower and wash wound with dial antibacterial soap and water prior to dressing change. Peri-Wound Care Skin Prep Discharge Instruction: Use skin prep as directed Topical Primary Dressing KerraCel Ag Gelling Fiber Dressing, 2x2 in (silver alginate) Discharge Instruction: Apply silver alginate to wound bed as instructed Secondary Dressing Woven Gauze Sponges 2x2 in Discharge Instruction: Apply over primary dressing as directed. Optifoam Non-Adhesive Dressing, 4x4 in Discharge Instruction: **Foam donut** Apply over primary dressing as directed. Secured With 41M Medipore H Soft Cloth Surgical T 4 x 2 (in/yd) ape Discharge Instruction: Secure dressing with tape as directed. Compression Wrap Compression Stockings Add-Ons Electronic Signature(s) Signed: 08/08/2020 10:10:18 AM By: Kalman Shan DO Signed: 08/08/2020 6:21:36 PM By: Deon Pilling Signed: 08/08/2020 6:21:36 PM By: Deon Pilling Entered By: Kalman Shan on 08/08/2020 10:02:32 -------------------------------------------------------------------------------- Multi-Disciplinary Care Plan Details Patient Name: Date of Service: [name]. 08/08/2020 8:30 A M Medical Record Number: [id number] Patient Account Number: [id number] Date of Birth/Sex: Treating RN: [birth year] (70 y.o. Hessie Diener Primary Care Verda Mehta: Allyn Kenner Other Clinician: Referring Synethia Endicott: Treating Carmela Piechowski/Extender: Dierdre Harness in Treatment: 4 Multidisciplinary Care Plan reviewed with physician Active Inactive Nutrition Nursing Diagnoses: Impaired glucose control: actual or potential Potential for alteratiion in  Nutrition/Potential for imbalanced nutrition Goals: Patient/caregiver agrees to and verbalizes understanding of need to use nutritional supplements and/or vitamins as prescribed Date Initiated: 07/11/2020 Date Inactivated: 08/01/2020 Target Resolution Date: 08/09/2020 Goal Status: Met Patient/caregiver will maintain therapeutic glucose control Date Initiated: 07/11/2020 Target Resolution Date: 08/30/2020 Goal Status: Active Interventions: Assess HgA1c results as ordered upon admission and as needed Assess patient nutrition upon admission and as needed per policy Provide education on elevated blood sugars and impact on wound healing Provide education on nutrition Notes: Electronic Signature(s) Signed: 08/08/2020 6:21:36 PM By: Deon Pilling Entered By: Deon Pilling on 08/08/2020 09:22:50 -------------------------------------------------------------------------------- Pain Assessment Details Patient Name: Date of Service: [name]. 08/08/2020 8:30 A M Medical Record Number: [id number] Patient Account Number: [id number] Date of Birth/Sex: Treating RN: 1950-07-16 (70 y.o. Hessie Diener Primary Care Areg Bialas: Allyn Kenner Other Clinician: Referring Peityn Payton: Treating Josmar Messimer/Extender: Dierdre Harness in Treatment: 4 Active Problems Location of Pain Severity and Description of Pain Patient Has Paino No Site Locations Pain Management and Medication Current Pain Management: Electronic Signature(s) Signed: 08/08/2020 9:47:30 AM By: Sandre Kitty Signed: 08/08/2020 6:21:36 PM By: Deon Pilling Entered By: Sandre Kitty on 08/08/2020 08:36:10 -------------------------------------------------------------------------------- Patient/Caregiver Education Details Patient Name: Date of Service: Jeremy Johann,  [place] 5/5/2022andnbsp8:30 A M Medical Record Number: [id number] Patient Account Number: [id number] Date of Birth/Gender: Treating RN: May 14, 1950 (70 y.o. Hessie Diener Primary Care Physician: Allyn Kenner Other Clinician: Referring Physician: Treating Physician/Extender: Dierdre Harness in Treatment: 4 Education Assessment Education Provided To: Patient Education Topics Provided Elevated Blood Sugar/ Impact on Healing: Handouts: Elevated Blood Sugars: How Do They Affect Wound Healing Methods: Explain/Verbal Responses: Reinforcements needed Electronic Signature(s) Signed: 08/08/2020 6:21:36 PM By: Deon Pilling Entered By: Deon Pilling on 08/08/2020 09:23:01 -------------------------------------------------------------------------------- Wound Assessment Details Patient Name: Date of Service: [name]. 08/08/2020 8:30 A M Medical Record Number: [id number] Patient Account Number: [id number] Date of Birth/Sex: Treating RN: May 13, 1950 (70 y.o. Hessie Diener Primary Care Jakhi Dishman: Allyn Kenner Other Clinician: Referring Esbeidy Mclaine: Treating Darion Milewski/Extender: Dierdre Harness in Treatment: 4 Wound Status Wound Number: 4 Primary Diabetic Wound/Ulcer of the Lower Extremity Etiology: Wound Location: Right Metatarsal head first Wound Open Wounding Event: Blister Status: Date Acquired: 05/07/2020 Comorbid Cataracts, Sleep Apnea, Coronary Artery Disease, Hypertension, Weeks Of Treatment: 4 History: Peripheral Arterial Disease, Type II Diabetes, Neuropathy Clustered Wound: No Photos Wound Measurements Length: (cm) 0.1 Width: (cm) 0.1 Depth: (cm) 0.1 Area: (cm) 0.008 Volume: (cm) 0.001 % Reduction in Area: 87.3% % Reduction in Volume: 94.7% Epithelialization: Large (67-100%) Tunneling: No Undermining: No Wound Description Classification: Grade 2 Wound Margin: Well defined, not attached Exudate Amount: Medium Exudate Type: Serosanguineous Exudate Color: red, brown Foul Odor After Cleansing: No Slough/Fibrino No Wound Bed Granulation Amount: Large (67-100%) Exposed  Structure Granulation Quality: Pink Fascia Exposed: No Necrotic Amount: None Present (0%) Fat Layer (Subcutaneous Tissue) Exposed: Yes Tendon Exposed: No Muscle Exposed: No Joint Exposed: No Bone Exposed: No Assessment Notes callous periwound. Treatment Notes Wound #4 (Metatarsal head first) Wound Laterality: Right Cleanser Soap and Water Discharge Instruction: May shower and wash wound with dial antibacterial soap and water prior to dressing change. Peri-Wound Care Skin Prep Discharge Instruction: Use skin prep as directed Topical Primary Dressing KerraCel Ag Gelling Fiber Dressing, 2x2 in (silver alginate) Discharge Instruction: Apply silver alginate to wound bed as instructed Secondary Dressing Woven Gauze Sponges 2x2 in Discharge Instruction: Apply over primary dressing as directed. Optifoam Non-Adhesive Dressing, 4x4 in Discharge Instruction: **Foam donut** Apply over primary dressing as directed. Secured With 34M Medipore H Soft Cloth Surgical T 4 x 2 (in/yd) ape Discharge Instruction: Secure dressing with tape as directed. Compression Wrap Compression Stockings Add-Ons Electronic Signature(s) Signed: 08/08/2020 5:12:45 PM By: Sandre Kitty Signed: 08/08/2020 6:21:36 PM By: Deon Pilling Entered By: Sandre Kitty on 08/08/2020 17:06:56 -------------------------------------------------------------------------------- Vitals Details Patient Name: Date of Service: [name]. 08/08/2020 8:30 A M Medical Record Number: [id number] Patient Account Number: [id number] Date of Birth/Sex: Treating RN: 28-Jan-1951 (70 y.o. Hessie Diener Primary Care Nykira Reddix: Allyn Kenner Other Clinician: Referring Quierra Silverio: Treating Vista Sawatzky/Extender: Dierdre Harness in Treatment: 4 Vital Signs Time Taken: 08:35 Temperature (F): 98.4 Height (in): 70 Pulse (bpm): 77 Weight (lbs): 225 Respiratory Rate (breaths/min): 16 Body Mass Index (BMI): 32.3 Blood  Pressure (mmHg): 91/56 Capillary Blood Glucose (mg/dl): 109 Reference Range: 80 - 120 mg / dl Electronic Signature(s) Signed: 08/08/2020 9:47:30 AM By: Sandre Kitty Entered By: Sandre Kitty on 08/08/2020 08:36:04

## 2020-08-11 DIAGNOSIS — E1165 Type 2 diabetes mellitus with hyperglycemia: Secondary | ICD-10-CM | POA: Diagnosis not present

## 2020-08-15 ENCOUNTER — Encounter (HOSPITAL_BASED_OUTPATIENT_CLINIC_OR_DEPARTMENT_OTHER): Payer: Medicare HMO | Admitting: Internal Medicine

## 2020-08-15 ENCOUNTER — Other Ambulatory Visit: Payer: Self-pay

## 2020-08-15 DIAGNOSIS — E11621 Type 2 diabetes mellitus with foot ulcer: Secondary | ICD-10-CM

## 2020-08-15 DIAGNOSIS — E1151 Type 2 diabetes mellitus with diabetic peripheral angiopathy without gangrene: Secondary | ICD-10-CM | POA: Diagnosis not present

## 2020-08-15 DIAGNOSIS — L97519 Non-pressure chronic ulcer of other part of right foot with unspecified severity: Secondary | ICD-10-CM | POA: Diagnosis not present

## 2020-08-15 DIAGNOSIS — I10 Essential (primary) hypertension: Secondary | ICD-10-CM | POA: Diagnosis not present

## 2020-08-15 DIAGNOSIS — G4730 Sleep apnea, unspecified: Secondary | ICD-10-CM | POA: Diagnosis not present

## 2020-08-15 DIAGNOSIS — I2510 Atherosclerotic heart disease of native coronary artery without angina pectoris: Secondary | ICD-10-CM | POA: Diagnosis not present

## 2020-08-15 DIAGNOSIS — E785 Hyperlipidemia, unspecified: Secondary | ICD-10-CM | POA: Diagnosis not present

## 2020-08-15 DIAGNOSIS — Z794 Long term (current) use of insulin: Secondary | ICD-10-CM | POA: Diagnosis not present

## 2020-08-15 DIAGNOSIS — E1140 Type 2 diabetes mellitus with diabetic neuropathy, unspecified: Secondary | ICD-10-CM | POA: Diagnosis not present

## 2020-08-19 NOTE — Progress Notes (Signed)
[name], [name] ([id number]) Visit Report for 08/15/2020 Chief Complaint Document Details Patient Name: Date of Service: [name], [name]. 08/15/2020 8:30 A M Medical Record Number: [id number] Patient Account Number: [id number] Date of Birth/Sex: Treating RN: 12-12-1950 (70 y.o. [name], [name] Primary Care Provider: Nita [name] Other Clinician: Referring Provider: Treating Provider/Extender: [name] in Treatment: 5 Information Obtained from: Patient Chief Complaint the patient arrives for evaluation of a wound on his first right metatarsal head, plantar aspect Electronic Signature(s) Signed: 08/15/2020 11:13:12 AM By: [name] Corwin DO Entered By: [name] on 08/15/2020 09:51:10 -------------------------------------------------------------------------------- Debridement Details Patient Name: Date of Service: [name]. 08/15/2020 8:30 A M Medical Record Number: [id number] Patient Account Number: [id number] Date of Birth/Sex: Treating RN: 03/03/1951 (69 y.o. [name], [name] Primary Care Provider: Nita [name] Other Clinician: Referring Provider: Treating Provider/Extender: [name] in Treatment: 5 Debridement Performed for Assessment: Wound #4 Right Metatarsal head first Performed By: Physician [name] Corwin, DO Debridement Type: Debridement Severity of Tissue Pre Debridement: Fat layer exposed Level of Consciousness (Pre-procedure): Awake and Alert Pre-procedure Verification/Time Out Yes - 09:37 Taken: Start Time: 09:37 Pain Control: Lidocaine T Area Debrided (L x W): otal 0.1 (cm) x 0.1 (cm) = 0.01 (cm) Tissue and other material debrided: Viable, Non-Viable, Callus, Subcutaneous, Skin: Dermis , Skin: Epidermis Level: Skin/Subcutaneous Tissue Debridement Description: Excisional Instrument: Curette Bleeding: Minimum Hemostasis Achieved: Pressure End Time: 09:38 Procedural Pain: 0 Post  Procedural Pain: 0 Response to Treatment: Procedure was tolerated well Level of Consciousness (Post- Awake and Alert procedure): Post Debridement Measurements of Total Wound Length: (cm) 0.1 Width: (cm) 0.1 Depth: (cm) 0.3 Volume: (cm) 0.002 Character of Wound/Ulcer Post Debridement: Improved Severity of Tissue Post Debridement: Fat layer exposed Post Procedure Diagnosis Same as Pre-procedure Electronic Signature(s) Signed: 08/15/2020 11:13:12 AM By: [name] Corwin DO Signed: 08/19/2020 6:08:57 PM By: [name] Mu RN Entered By: [name] on 08/15/2020 09:38:32 -------------------------------------------------------------------------------- HPI Details Patient Name: Date of Service: [name]. 08/15/2020 8:30 A M Medical Record Number: [id number] Patient Account Number: [id number] Date of Birth/Sex: Treating RN: 1950/10/23 (70 y.o. [name] Primary Care Provider: Nita [name] Other Clinician: Referring Provider: Treating Provider/Extender: [name] in Treatment: 5 History of Present Illness HPI Description: 07/12/19 [name] is a 70 year old male with a past medical history of insulin-dependent type 2 diabetes, essential hypertension and coronary artery disease status post CABG in 2009. He has been seen in our clinic on three separate occasions for diabetic foot wounds. Last seen in 2018 for a left fifth metatarsal head diabetic foot ulcer. This was treated with silver alginate and offloading. Other previous visits were in 2009 and 2015. Patient states that 2 months ago he noticed a blister to the bottom of his foot. He reports using a foot pedal and applying constant pressure to this area for 3 hours a day. 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 He has noticed minimal drainage to the wound bed and denies any pain, swelling or erythema  to the right foot. 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 In the past 2 months he was seen at Podiatry in [place], [place] and [place] [facility] for this issue. He states Music therapist obtained an x-ray recently and reports it showed no significant  findings. He was started on a cream for which he can not remember the name. He states he uses this with daily dressing changes. He was not satisfied with his care at either location and has since decided to follow-up with [place]. Overall he feels well 4/14; Patient presents for his 1 week follow-up. He reports no issues in the past week. He has been using his surgical shoe and has been aggressive with offloading. He is pleased with the progress of his wound healing. He denies any drainage, increased warmth or erythema to the foot. 4/21; Patient presents for 1 week follow-up. He has been using silver alginate every other day along with a surgical shoe. He denies drainage, increased warmth or erythema to the foot. 4/28; patient presents for 1 week follow-up. He has been using silver alginate every other day along with his surgical shoe. He has try to aggressively offload the area in the past week. At this time he would like to continue with current therapy as he is pleased with the progress thus far. He would like to only do a contact cast if this were to worsen. He states that he has 5 more days off before he has to go to work. And will be trying to offload it aggressively in the next week as well. He denies drainage, increased warmth or erythema to the foot. 5/5; patient presents for 1 week follow-up. He started back at work and has been trying to offload the best he can. He continues to use silver alginate with his surgical shoe. He has no new issues or complaints today. He denies signs of infection. 5/12; patient presents for 1 week follow-up. He has been using silver alginate  every other day. He tries to offload this area the best he can despite work and having to be on his feet. He has no issues or complaints today. He denies any signs of infection. Electronic Signature(s) Signed: 08/15/2020 11:13:12 AM By: [name] Corwin DO Entered By: [name] on 08/15/2020 09:51:52 -------------------------------------------------------------------------------- Physical Exam Details Patient Name: Date of Service: [name]. 08/15/2020 8:30 A M Medical Record Number: [id number] Patient Account Number: [id number] Date of Birth/Sex: Treating RN: January 09, 1951 (70 y.o. [name] Primary Care Provider: Nita [name] Other Clinician: Referring Provider: Treating Provider/Extender: [name] in Treatment: 5 Constitutional respirations regular, non-labored and within target range for patient.. Cardiovascular 2+ dorsalis pedis/posterior tibialis pulses. Psychiatric pleasant and cooperative. Notes Right foot: Metatarsal first head with small open wound and circumferential callus. This was removed and granulation tissue was present at the wound bed. No signs of infection. Electronic Signature(s) Signed: 08/15/2020 11:13:12 AM By: [name] Corwin DO Entered By: [name] on 08/15/2020 09:52:26 -------------------------------------------------------------------------------- Physician Orders Details Patient Name: Date of Service: [name]. 08/15/2020 8:30 A M Medical Record Number: [id number] Patient Account Number: [id number] Date of Birth/Sex: Treating RN: Aug 31, 1950 (70 y.o. [name], [name] Primary Care Provider: Nita [name] Other Clinician: Referring Provider: Treating Provider/Extender: [name] in Treatment: 5 Verbal / Phone Orders: No Diagnosis Coding ICD-10 Coding Code Description E11.621 Type 2 diabetes mellitus with foot ulcer E11.40 Type 2 diabetes mellitus with diabetic  neuropathy, unspecified I25.10 Atherosclerotic heart disease of native coronary artery without angina pectoris I10 Essential (primary) hypertension Follow-up Appointments ppointment in 2 weeks. - with Dr. Mikey Bussing Return A Bathing/ Shower/ Hygiene May shower and wash wound with soap and water. - with dressing changes only. Off-Loading Wedge shoe to: - patient  to wear front offloading wedge shoe. Wound Treatment Wound #4 - Metatarsal head first Wound Laterality: Right Cleanser: Soap and Water 1 x Per Day Discharge Instructions: May shower and wash wound with dial antibacterial soap and water prior to dressing change. Peri-Wound Care: Skin Prep 1 x Per Day Discharge Instructions: Use skin prep as directed Prim Dressing: KerraCel Ag Gelling Fiber Dressing, 2x2 in (silver alginate) 1 x Per Day ary Discharge Instructions: Apply silver alginate to wound bed as instructed Secondary Dressing: Woven Gauze Sponges 2x2 in 1 x Per Day Discharge Instructions: Apply over primary dressing as directed. Secondary Dressing: Optifoam Non-Adhesive Dressing, 4x4 in 1 x Per Day Discharge Instructions: **Foam donut** Apply over primary dressing as directed. Secured With: Insurance underwriter, Sterile 2x75 (in/in) 1 x Per Day Discharge Instructions: Secure with stretch gauze as directed. Secured With: 60M Medipore H Soft Cloth Surgical T 4 x 2 (in/yd) 1 x Per Day ape Discharge Instructions: Secure dressing with tape as directed. Electronic Signature(s) Signed: 08/15/2020 11:13:12 AM By: [name] Corwin DO Entered By: [name] on 08/15/2020 09:52:41 -------------------------------------------------------------------------------- Problem List Details Patient Name: Date of Service: [name]. 08/15/2020 8:30 A M Medical Record Number: [id number] Patient Account Number: [id number] Date of Birth/Sex: Treating RN: 29-Jun-1950 (70 y.o. [name], [name] Primary Care Provider: Nita [name] Other Clinician: Referring Provider: Treating Provider/Extender: [name] in Treatment: 5 Active Problems ICD-10 Encounter Code Description Active Date MDM Diagnosis E11.621 Type 2 diabetes mellitus with foot ulcer 07/11/2020 No Yes E11.40 Type 2 diabetes mellitus with diabetic neuropathy, unspecified 07/11/2020 No Yes I25.10 Atherosclerotic heart disease of native coronary artery without angina pectoris 07/11/2020 No Yes I10 Essential (primary) hypertension 07/11/2020 No Yes Inactive Problems Resolved Problems Electronic Signature(s) Signed: 08/15/2020 11:13:12 AM By: [name] Corwin DO Entered By: [name] on 08/15/2020 09:50:53 -------------------------------------------------------------------------------- Progress Note Details Patient Name: Date of Service: [name]. 08/15/2020 8:30 A M Medical Record Number: [id number] Patient Account Number: [id number] Date of Birth/Sex: Treating RN: 1950/06/26 (70 y.o. [name] Primary Care Provider: Nita [name] Other Clinician: Referring Provider: Treating Provider/Extender: [name] in Treatment: 5 Subjective Chief Complaint Information obtained from Patient the patient arrives for evaluation of a wound on his first right metatarsal head, plantar aspect History of Present Illness (HPI) 07/12/19 [name] is a 70 year old male with a past medical history of insulin-dependent type 2 diabetes, essential hypertension and coronary artery disease status post CABG in 2009. He has been seen in our clinic on three separate occasions for diabetic foot wounds. Last seen in 2018 for a left fifth metatarsal head diabetic foot ulcer. This was treated with silver alginate and offloading. Other previous visits were in 2009 and 2015. Patient states that 2 months ago he noticed a blister to the bottom of his foot. He reports using a foot pedal and applying constant pressure to  this area for 3 hours a day. 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 He has noticed minimal drainage to the wound bed and denies any pain, swelling or erythema to the right foot. 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 In the past 2 months he was seen at Podiatry in [place], [place] and [place] [facility] for this issue. He states Music therapist obtained an x-ray  recently and reports it showed no significant findings. He was started on a cream for which he can not remember the name. He states he uses this with daily dressing changes. He was not satisfied with his care at either location and has since decided to follow-up with [place]. Overall he feels well 4/14; Patient presents for his 1 week follow-up. He reports no issues in the past week. He has been using his surgical shoe and has been aggressive with offloading. He is pleased with the progress of his wound healing. He denies any drainage, increased warmth or erythema to the foot. 4/21; Patient presents for 1 week follow-up. He has been using silver alginate every other day along with a surgical shoe. He denies drainage, increased warmth or erythema to the foot. 4/28; patient presents for 1 week follow-up. He has been using silver alginate every other day along with his surgical shoe. He has try to aggressively offload the area in the past week. At this time he would like to continue with current therapy as he is pleased with the progress thus far. He would like to only do a contact cast if this were to worsen. He states that he has 5 more days off before he has to go to work. And will be trying to offload it aggressively in the next week as well. He denies drainage, increased warmth or erythema to the foot. 5/5; patient presents for 1 week follow-up. He started back at work and has  been trying to offload the best he can. He continues to use silver alginate with his surgical shoe. He has no new issues or complaints today. He denies signs of infection. 5/12; patient presents for 1 week follow-up. He has been using silver alginate every other day. He tries to offload this area the best he can despite work and having to be on his feet. He has no issues or complaints today. He denies any signs of infection. Patient History Information obtained from Patient. Family History Cancer - Mother, Diabetes - Father,Siblings, Heart Disease - Father,Siblings, Hypertension - Mother,Father,Siblings, No family history of Hereditary Spherocytosis, Kidney Disease, Lung Disease, Seizures, Stroke, Thyroid Problems, Tuberculosis. Social History Never smoker, Marital Status - Married, Alcohol Use - Never, Drug Use - No History, Caffeine Use - Daily - coffee. Medical History Eyes Patient has history of Cataracts - removed Denies history of Glaucoma, Optic Neuritis Ear/Nose/Mouth/Throat Denies history of Chronic sinus problems/congestion, Middle ear problems Hematologic/Lymphatic Denies history of Anemia, Hemophilia, Human Immunodeficiency Virus, Lymphedema, Sickle Cell Disease Respiratory Patient has history of Sleep Apnea Denies history of Aspiration, Asthma, Chronic Obstructive Pulmonary Disease (COPD), Pneumothorax, Tuberculosis Cardiovascular Patient has history of Coronary Artery Disease - CABG x5, Hypertension, Peripheral Arterial Disease Denies history of Angina, Arrhythmia, Congestive Heart Failure, Deep Vein Thrombosis, Hypotension, Myocardial Infarction, Peripheral Venous Disease, Phlebitis, Vasculitis Gastrointestinal Denies history of Cirrhosis , Colitis, Crohnoos, Hepatitis A, Hepatitis B, Hepatitis C Endocrine Patient has history of Type II Diabetes Denies history of Type I Diabetes Genitourinary Denies history of End Stage Renal Disease Immunological Denies history of  Lupus Erythematosus, Raynaudoos, Scleroderma Integumentary (Skin) Denies history of History of Burn Musculoskeletal Denies history of Gout, Rheumatoid Arthritis, Osteoarthritis, Osteomyelitis Neurologic Patient has history of Neuropathy Denies history of Dementia, Quadriplegia, Paraplegia, Seizure Disorder Oncologic Denies history of Received Chemotherapy, Received Radiation Psychiatric Denies history of Anorexia/bulimia, Confinement Anxiety Hospitalization/Surgery History - coronoary artery bypass. - cardiac cath. - back surgery. - appendectomy. - tonsillectomy. Medical A Surgical History Notes nd Cardiovascular  CVA, hyperlipidemia, carotid stenosis Gastrointestinal colon polyps Genitourinary Renal Insufficiency Integumentary (Skin) (L) foot ulcer - Objective Constitutional respirations regular, non-labored and within target range for patient.. Vitals Time Taken: 8:48 AM, Height: 70 in, Weight: 225 lbs, BMI: 32.3, Temperature: 98.5 F, Pulse: 61 bpm, Respiratory Rate: 16 breaths/min, Blood Pressure: 113/68 mmHg, Capillary Blood Glucose: 120 mg/dl. Cardiovascular 2+ dorsalis pedis/posterior tibialis pulses. Psychiatric pleasant and cooperative. General Notes: Right foot: Metatarsal first head with small open wound and circumferential callus. This was removed and granulation tissue was present at the wound bed. No signs of infection. Integumentary (Hair, Skin) Wound #4 status is Open. Original cause of wound was Blister. The date acquired was: 05/07/2020. The wound has been in treatment 5 weeks. The wound is located on the Right Metatarsal head first. The wound measures 0.1cm length x 0.1cm width x 0.3cm depth; 0.008cm^2 area and 0.002cm^3 volume. There is Fat Layer (Subcutaneous Tissue) exposed. There is no tunneling or undermining noted. There is a medium amount of serosanguineous drainage noted. The wound margin is well defined and not attached to the wound base. There is  large (67-100%) pink granulation within the wound bed. There is no necrotic tissue within the wound bed. General Notes: Calloused Periwound Assessment Active Problems ICD-10 Type 2 diabetes mellitus with foot ulcer Type 2 diabetes mellitus with diabetic neuropathy, unspecified Atherosclerotic heart disease of native coronary artery without angina pectoris Essential (primary) hypertension Patient's wound is stable. There are no signs of infection. It was debrided today. Overall the tissue looks healthy. He would like to do the dressing every day and I think this would be good to try. I recommended he offload aggressively and the best he can despite work. He does not want to you do a total contact cast at this time. We will revisit this down the road. He will be out of town for 2 weeks and we will see him when he returns. Procedures Wound #4 Pre-procedure diagnosis of Wound #4 is a Diabetic Wound/Ulcer of the Lower Extremity located on the Right Metatarsal head first .Severity of Tissue Pre Debridement is: Fat layer exposed. There was a Excisional Skin/Subcutaneous Tissue Debridement with a total area of 0.01 sq cm performed by [name] CorwinHoffman, Azrielle Springsteen, DO. With the following instrument(s): Curette to remove Viable and Non-Viable tissue/material. Material removed includes Callus, Subcutaneous Tissue, Skin: Dermis, and Skin: Epidermis after achieving pain control using Lidocaine. No specimens were taken. A time out was conducted at 09:37, prior to the start of the procedure. A Minimum amount of bleeding was controlled with Pressure. The procedure was tolerated well with a pain level of 0 throughout and a pain level of 0 following the procedure. Post Debridement Measurements: 0.1cm length x 0.1cm width x 0.3cm depth; 0.002cm^3 volume. Character of Wound/Ulcer Post Debridement is improved. Severity of Tissue Post Debridement is: Fat layer exposed. Post procedure Diagnosis Wound #4: Same as  Pre-Procedure Plan Follow-up Appointments: Return Appointment in 2 weeks. - with Dr. Mikey BussingHoffman Bathing/ Shower/ Hygiene: May shower and wash wound with soap and water. - with dressing changes only. Off-Loading: Wedge shoe to: - patient to wear front offloading wedge shoe. WOUND #4: - Metatarsal head first Wound Laterality: Right Cleanser: Soap and Water 1 x Per Day/ Discharge Instructions: May shower and wash wound with dial antibacterial soap and water prior to dressing change. Peri-Wound Care: Skin Prep 1 x Per Day/ Discharge Instructions: Use skin prep as directed Prim Dressing: KerraCel Ag Gelling Fiber Dressing, 2x2 in (silver alginate) 1 x  Per Day/ ary Discharge Instructions: Apply silver alginate to wound bed as instructed Secondary Dressing: Woven Gauze Sponges 2x2 in 1 x Per Day/ Discharge Instructions: Apply over primary dressing as directed. Secondary Dressing: Optifoam Non-Adhesive Dressing, 4x4 in 1 x Per Day/ Discharge Instructions: **Foam donut** Apply over primary dressing as directed. Secured With: Insurance underwriter, Sterile 2x75 (in/in) 1 x Per Day/ Discharge Instructions: Secure with stretch gauze as directed. Secured With: 2M Medipore H Soft Cloth Surgical T 4 x 2 (in/yd) 1 x Per Day/ ape Discharge Instructions: Secure dressing with tape as directed. 1. Silver alginate every day 2. Aggressively offload 3. Follow-up in 2 weeks Electronic Signature(s) Signed: 08/15/2020 11:13:12 AM By: [name] Corwin DO Entered By: [name] on 08/15/2020 09:54:35 -------------------------------------------------------------------------------- HxROS Details Patient Name: Date of Service: [name]. 08/15/2020 8:30 A M Medical Record Number: [id number] Patient Account Number: [id number] Date of Birth/Sex: Treating RN: [birth year] (70 y.o. [name] Primary Care Provider: Nita [name] Other Clinician: Referring Provider: Treating  Provider/Extender: [name] in Treatment: 5 Information Obtained From Patient Eyes Medical History: Positive for: Cataracts - removed Negative for: Glaucoma; Optic Neuritis Ear/Nose/Mouth/Throat Medical History: Negative for: Chronic sinus problems/congestion; Middle ear problems Hematologic/Lymphatic Medical History: Negative for: Anemia; Hemophilia; Human Immunodeficiency Virus; Lymphedema; Sickle Cell Disease Respiratory Medical History: Positive for: Sleep Apnea Negative for: Aspiration; Asthma; Chronic Obstructive Pulmonary Disease (COPD); Pneumothorax; Tuberculosis Cardiovascular Medical History: Positive for: Coronary Artery Disease - CABG x5; Hypertension; Peripheral Arterial Disease Negative for: Angina; Arrhythmia; Congestive Heart Failure; Deep Vein Thrombosis; Hypotension; Myocardial Infarction; Peripheral Venous Disease; Phlebitis; Vasculitis Past Medical History Notes: CVA, hyperlipidemia, carotid stenosis Gastrointestinal Medical History: Negative for: Cirrhosis ; Colitis; Crohns; Hepatitis A; Hepatitis B; Hepatitis C Past Medical History Notes: colon polyps Endocrine Medical History: Positive for: Type II Diabetes Negative for: Type I Diabetes Time with diabetes: over 10 years Treated with: Insulin, Oral agents Blood sugar tested every day: Yes Tested : 2-3 times a day Blood sugar testing results: Breakfast: 160 Genitourinary Medical History: Negative for: End Stage Renal Disease Past Medical History Notes: Renal Insufficiency Immunological Medical History: Negative for: Lupus Erythematosus; Raynauds; Scleroderma Integumentary (Skin) Medical History: Negative for: History of Burn Past Medical History Notes: (L) foot ulcer - Musculoskeletal Medical History: Negative for: Gout; Rheumatoid Arthritis; Osteoarthritis; Osteomyelitis Neurologic Medical History: Positive for: Neuropathy Negative for: Dementia; Quadriplegia;  Paraplegia; Seizure Disorder Oncologic Medical History: Negative for: Received Chemotherapy; Received Radiation Psychiatric Medical History: Negative for: Anorexia/bulimia; Confinement Anxiety HBO Extended History Items Eyes: Cataracts Immunizations Pneumococcal Vaccine: Received Pneumococcal Vaccination: No Implantable Devices No devices added Hospitalization / Surgery History Type of Hospitalization/Surgery coronoary artery bypass cardiac cath back surgery appendectomy tonsillectomy Family and Social History Cancer: Yes - Mother; Diabetes: Yes - Father,Siblings; Heart Disease: Yes - Father,Siblings; Hereditary Spherocytosis: No; Hypertension: Yes - Mother,Father,Siblings; Kidney Disease: No; Lung Disease: No; Seizures: No; Stroke: No; Thyroid Problems: No; Tuberculosis: No; Never smoker; Marital Status - Married; Alcohol Use: Never; Drug Use: No History; Caffeine Use: Daily - coffee; Financial Concerns: No; Food, Clothing or Shelter Needs: No; Support System Lacking: No; Transportation Concerns: No Electronic Signature(s) Signed: 08/15/2020 11:13:12 AM By: [name] Corwin DO Signed: 08/19/2020 6:08:57 PM By: [name] Mu RN Entered By: [name] on 08/15/2020 09:52:04 -------------------------------------------------------------------------------- SuperBill Details Patient Name: Date of Service: [name]. 08/15/2020 Medical Record Number: [id number] Patient Account Number: [id number] Date of Birth/Sex: Treating RN: [birth year] (70 y.o. [name], [name] Primary Care Provider: Nita [name]  Other Clinician: Referring Provider: Treating Provider/Extender: [name] in Treatment: 5 Diagnosis Coding ICD-10 Codes Code Description E11.621 Type 2 diabetes mellitus with foot ulcer E11.40 Type 2 diabetes mellitus with diabetic neuropathy, unspecified I25.10 Atherosclerotic heart disease of native coronary artery without angina  pectoris I10 Essential (primary) hypertension Facility Procedures CPT4 Code: [id number] Description: 11042 - DEB SUBQ TISSUE 20 SQ CM/< ICD-10 Diagnosis Description E11.621 Type 2 diabetes mellitus with foot ulcer Modifier: Quantity: 1 Physician Procedures : CPT4 Code Description Modifier [id number] 11042 - WC PHYS SUBQ TISS 20 SQ CM ICD-10 Diagnosis Description E11.621 Type 2 diabetes mellitus with foot ulcer Quantity: 1 Electronic Signature(s) Signed: 08/15/2020 11:13:12 AM By: [name] Corwin DO Entered By: [name] on 08/15/2020 09:54:47

## 2020-08-19 NOTE — Progress Notes (Signed)
EARLEY, GROBE ([id number]) Visit Report for 08/15/2020 Arrival Information Details Patient Name: Date of Service: [name], [name]. 08/15/2020 8:30 A M Medical Record Number: [id number] Patient Account Number: [id number] Date of Birth/Sex: Treating RN: 1950-12-24 (70 y.o. [name], [name] Primary Care Zakiah Gauthreaux: Allyn Kenner Other Clinician: Referring Saida Lonon: Treating Ahria Slappey/Extender: Dierdre Harness in Treatment: 5 Visit Information History Since Last Visit Added or deleted any medications: No Patient Arrived: Ambulatory Any new allergies or adverse reactions: No Arrival Time: 08:47 Had a fall or experienced change in No Accompanied By: self activities of daily living that may affect Transfer Assistance: None risk of falls: Patient Identification Verified: Yes Signs or symptoms of abuse/neglect since last visito No Secondary Verification Process Completed: Yes Hospitalized since last visit: No Patient Requires Transmission-Based Precautions: No Implantable device outside of the clinic excluding No Patient Has Alerts: Yes cellular tissue based products placed in the center Patient Alerts: Patient on Blood Thinner since last visit: R ABI non compressible Has Dressing in Place as Prescribed: Yes Pain Present Now: No Electronic Signature(s) Signed: 08/15/2020 10:33:10 AM By: Sandre Kitty Entered By: Sandre Kitty on 08/15/2020 08:47:44 -------------------------------------------------------------------------------- Encounter Discharge Information Details Patient Name: Date of Service: [name]. 08/15/2020 8:30 A M Medical Record Number: [id number] Patient Account Number: [id number] Date of Birth/Sex: Treating RN: Dec 22, 1950 (70 y.o. Ernestene Mention Primary Care Stacey Sago: Allyn Kenner Other Clinician: Referring Shatia Sindoni: Treating Marqui Formby/Extender: Dierdre Harness in Treatment: 5 Encounter Discharge Information  Items Post Procedure Vitals Discharge Condition: Stable Temperature (F): 98.5 Ambulatory Status: Ambulatory Pulse (bpm): 61 Discharge Destination: Home Respiratory Rate (breaths/min): 18 Transportation: Private Auto Blood Pressure (mmHg): 113/68 Accompanied By: self Schedule Follow-up Appointment: Yes Clinical Summary of Care: Patient Declined Electronic Signature(s) Signed: 08/19/2020 4:50:33 PM By: Baruch Gouty RN, BSN Entered By: Baruch Gouty on 08/15/2020 10:14:40 -------------------------------------------------------------------------------- Lower Extremity Assessment Details Patient Name: Date of Service: [name]. 08/15/2020 8:30 A M Medical Record Number: [id number] Patient Account Number: [id number] Date of Birth/Sex: Treating RN: Dec 25, 1950 (70 y.o. Marcheta Grammes Primary Care Amaal Dimartino: Allyn Kenner Other Clinician: Referring Garritt Molyneux: Treating Pari Lombard/Extender: Dierdre Harness in Treatment: 5 Edema Assessment Assessed: [Left: No] [Right: Yes] Edema: [Left: N] [Right: o] Calf Left: Right: Point of Measurement: 32 cm From Medial Instep 44 cm Ankle Left: Right: Point of Measurement: 11 cm From Medial Instep 27 cm Vascular Assessment Pulses: Dorsalis Pedis Palpable: [Right:Yes] Electronic Signature(s) Signed: 08/15/2020 4:36:36 PM By: Lorrin Jackson Entered By: Lorrin Jackson on 08/15/2020 09:27:20 -------------------------------------------------------------------------------- Multi Wound Chart Details Patient Name: Date of Service: [name]. 08/15/2020 8:30 A M Medical Record Number: [id number] Patient Account Number: [id number] Date of Birth/Sex: Treating RN: December 31, 1950 (69 y.o. [name], [name] Primary Care Bryana Froemming: Allyn Kenner Other Clinician: Referring Rachyl Wuebker: Treating Chade Pitner/Extender: Dierdre Harness in Treatment: 5 Vital Signs Height(in): 70 Capillary Blood Glucose(mg/dl):  120 Weight(lbs): 225 Pulse(bpm): 57 Body Mass Index(BMI): 32 Blood Pressure(mmHg): 113/68 Temperature(F): 98.5 Respiratory Rate(breaths/min): 16 Photos: [4:No Photos Right Metatarsal head first] [N/A:N/A N/A] Wound Location: [4:Blister] [N/A:N/A] Wounding Event: [4:Diabetic Wound/Ulcer of the Lower] [N/A:N/A] Primary Etiology: [4:Extremity Cataracts, Sleep Apnea, Coronary] [N/A:N/A] Comorbid History: [4:Artery Disease, Hypertension, Peripheral Arterial Disease, Type II Diabetes, Neuropathy 05/07/2020] [N/A:N/A] Date Acquired: [4:5] [N/A:N/A] Weeks of Treatment: [4:Open] [N/A:N/A] Wound Status: [4:0.1x0.1x0.3] [N/A:N/A] Measurements L x W x D (cm) [4:0.008] [N/A:N/A] A (cm) : rea [4:0.002] [N/A:N/A] Volume (cm) : [4:87.30%] [N/A:N/A] % Reduction in A rea: [4:89.50%] [  N/A:N/A] % Reduction in Volume: [4:Grade 2] [N/A:N/A] Classification: [4:Medium] [N/A:N/A] Exudate A mount: [4:Serosanguineous] [N/A:N/A] Exudate Type: [4:red, brown] [N/A:N/A] Exudate Color: [4:Well defined, not attached] [N/A:N/A] Wound Margin: [4:Large (67-100%)] [N/A:N/A] Granulation A mount: [4:Pink] [N/A:N/A] Granulation Quality: [4:None Present (0%)] [N/A:N/A] Necrotic A mount: [4:Fat Layer (Subcutaneous Tissue): Yes N/A] Exposed Structures: [4:Fascia: No Tendon: No Muscle: No Joint: No Bone: No Large (67-100%)] [N/A:N/A] Epithelialization: [4:Debridement - Excisional] [N/A:N/A] Debridement: Pre-procedure Verification/Time Out 09:37 [N/A:N/A] Taken: [4:Lidocaine] [N/A:N/A] Pain Control: [4:Callus, Subcutaneous] [N/A:N/A] Tissue Debrided: [4:Skin/Subcutaneous Tissue] [N/A:N/A] Level: [4:0.01] [N/A:N/A] Debridement A (sq cm): [4:rea Curette] [N/A:N/A] Instrument: [4:Minimum] [N/A:N/A] Bleeding: [4:Pressure] [N/A:N/A] Hemostasis A chieved: [4:0] [N/A:N/A] Procedural Pain: [4:0] [N/A:N/A] Post Procedural Pain: [4:Procedure was tolerated well] [N/A:N/A] Debridement Treatment Response: [4:0.1x0.1x0.3]  [N/A:N/A] Post Debridement Measurements L x W x D (cm) [4:0.002] [N/A:N/A] Post Debridement Volume: (cm) [4:Calloused Periwound] [N/A:N/A] Assessment Notes: [4:Debridement] [N/A:N/A] Treatment Notes Electronic Signature(s) Signed: 08/15/2020 11:13:12 AM By: Kalman Shan DO Signed: 08/19/2020 6:08:57 PM By: Rhae Hammock RN Entered By: Kalman Shan on 08/15/2020 09:51:01 -------------------------------------------------------------------------------- Multi-Disciplinary Care Plan Details Patient Name: Date of Service: [name]. 08/15/2020 8:30 A M Medical Record Number: [id number] Patient Account Number: [id number] Date of Birth/Sex: Treating RN: 02/10/1951 (70 y.o. [name], [name] Primary Care Milta Croson: Allyn Kenner Other Clinician: Referring Vicke Plotner: Treating Alanson Hausmann/Extender: Dierdre Harness in Treatment: 5 Multidisciplinary Care Plan reviewed with physician Active Inactive Nutrition Nursing Diagnoses: Impaired glucose control: actual or potential Potential for alteratiion in Nutrition/Potential for imbalanced nutrition Goals: Patient/caregiver agrees to and verbalizes understanding of need to use nutritional supplements and/or vitamins as prescribed Date Initiated: 07/11/2020 Date Inactivated: 08/01/2020 Target Resolution Date: 08/09/2020 Goal Status: Met Patient/caregiver will maintain therapeutic glucose control Date Initiated: 07/11/2020 Target Resolution Date: 08/30/2020 Goal Status: Active Interventions: Assess HgA1c results as ordered upon admission and as needed Assess patient nutrition upon admission and as needed per policy Provide education on elevated blood sugars and impact on wound healing Provide education on nutrition Notes: Electronic Signature(s) Signed: 08/19/2020 6:08:57 PM By: Rhae Hammock RN Entered By: Rhae Hammock on 08/15/2020  09:37:18 -------------------------------------------------------------------------------- Pain Assessment Details Patient Name: Date of Service: [name]. 08/15/2020 8:30 A M Medical Record Number: [id number] Patient Account Number: [id number] Date of Birth/Sex: Treating RN: 01-Sep-1950 (70 y.o. [name], [name] Primary Care Kristy Schomburg: Allyn Kenner Other Clinician: Referring Suki Crockett: Treating Shaneta Cervenka/Extender: Dierdre Harness in Treatment: 5 Active Problems Location of Pain Severity and Description of Pain Patient Has Paino No Site Locations Pain Management and Medication Current Pain Management: Electronic Signature(s) Signed: 08/15/2020 10:33:10 AM By: Sandre Kitty Signed: 08/19/2020 6:08:57 PM By: Rhae Hammock RN Entered By: Sandre Kitty on 08/15/2020 08:49:07 -------------------------------------------------------------------------------- Patient/Caregiver Education Details Patient Name: Date of Service: [name], [name] BERT W. 5/12/2022andnbsp8:30 [place] Record Number: [id number] Patient Account Number: [id number] Date of Birth/Gender: Treating RN: Mar 23, 1951 (70 y.o. Erie Noe Primary Care Physician: Allyn Kenner Other Clinician: Referring Physician: Treating Physician/Extender: Dierdre Harness in Treatment: 5 Education Assessment Education Provided To: Patient Education Topics Provided Elevated Blood Sugar/ Impact on Healing: Methods: Explain/Verbal Responses: State content correctly Electronic Signature(s) Signed: 08/19/2020 6:08:57 PM By: Rhae Hammock RN Entered By: Rhae Hammock on 08/15/2020 07:50:23 -------------------------------------------------------------------------------- Wound Assessment Details Patient Name: Date of Service: [name]. 08/15/2020 8:30 A M Medical Record Number: [id number] Patient Account Number: [id number] Date of Birth/Sex: Treating  RN: 1950-11-06 (70 y.o. Marcheta Grammes Primary Care Jahmal Dunavant: Allyn Kenner Other Clinician:  Referring Fread Kottke: Treating Colbe Viviano/Extender: Dierdre Harness in Treatment: 5 Wound Status Wound Number: 4 Primary Diabetic Wound/Ulcer of the Lower Extremity Etiology: Wound Location: Right Metatarsal head first Wound Open Wounding Event: Blister Status: Date Acquired: 05/07/2020 Comorbid Cataracts, Sleep Apnea, Coronary Artery Disease, Hypertension, Weeks Of Treatment: 5 History: Peripheral Arterial Disease, Type II Diabetes, Neuropathy Clustered Wound: No Photos Wound Measurements Length: (cm) 0.1 Width: (cm) 0.1 Depth: (cm) 0.3 Area: (cm) 0.008 Volume: (cm) 0.002 % Reduction in Area: 87.3% % Reduction in Volume: 89.5% Epithelialization: Large (67-100%) Tunneling: No Undermining: No Wound Description Classification: Grade 2 Wound Margin: Well defined, not attached Exudate Amount: Medium Exudate Type: Serosanguineous Exudate Color: red, brown Foul Odor After Cleansing: No Slough/Fibrino No Wound Bed Granulation Amount: Large (67-100%) Exposed Structure Granulation Quality: Pink Fascia Exposed: No Necrotic Amount: None Present (0%) Fat Layer (Subcutaneous Tissue) Exposed: Yes Tendon Exposed: No Muscle Exposed: No Joint Exposed: No Bone Exposed: No Assessment Notes Calloused Periwound Treatment Notes Wound #4 (Metatarsal head first) Wound Laterality: Right Cleanser Soap and Water Discharge Instruction: May shower and wash wound with dial antibacterial soap and water prior to dressing change. Peri-Wound Care Skin Prep Discharge Instruction: Use skin prep as directed Topical Primary Dressing KerraCel Ag Gelling Fiber Dressing, 2x2 in (silver alginate) Discharge Instruction: Apply silver alginate to wound bed as instructed Secondary Dressing Woven Gauze Sponges 2x2 in Discharge Instruction: Apply over primary dressing as directed. Optifoam  Non-Adhesive Dressing, 4x4 in Discharge Instruction: **Foam donut** Apply over primary dressing as directed. Secured With Conforming Stretch Gauze Bandage, Sterile 2x75 (in/in) Discharge Instruction: Secure with stretch gauze as directed. 24M Medipore H Soft Cloth Surgical T 4 x 2 (in/yd) ape Discharge Instruction: Secure dressing with tape as directed. Compression Wrap Compression Stockings Add-Ons Electronic Signature(s) Signed: 08/15/2020 3:37:13 PM By: Sandre Kitty Signed: 08/15/2020 4:36:36 PM By: Lorrin Jackson Entered By: Sandre Kitty on 08/15/2020 15:27:33 -------------------------------------------------------------------------------- Vitals Details Patient Name: Date of Service: [name]. 08/15/2020 8:30 A M Medical Record Number: [id number] Patient Account Number: [id number] Date of Birth/Sex: Treating RN: 10-Nov-1950 (70 y.o. [name], [name] Primary Care Mariel Lukins: Allyn Kenner Other Clinician: Referring Skylen Danielsen: Treating Elishia Kaczorowski/Extender: Dierdre Harness in Treatment: 5 Vital Signs Time Taken: 08:48 Temperature (F): 98.5 Height (in): 70 Pulse (bpm): 61 Weight (lbs): 225 Respiratory Rate (breaths/min): 16 Body Mass Index (BMI): 32.3 Blood Pressure (mmHg): 113/68 Capillary Blood Glucose (mg/dl): 120 Reference Range: 80 - 120 mg / dl Electronic Signature(s) Signed: 08/15/2020 10:33:10 AM By: Sandre Kitty Entered By: Sandre Kitty on 08/15/2020 08:49:01

## 2020-08-20 ENCOUNTER — Other Ambulatory Visit: Payer: Self-pay | Admitting: Physician Assistant

## 2020-08-21 ENCOUNTER — Ambulatory Visit (HOSPITAL_COMMUNITY)
Admission: RE | Admit: 2020-08-21 | Discharge: 2020-08-21 | Disposition: A | Discharge status: Home / Self Care | Payer: Medicare HMO | Source: Ambulatory Visit | Attending: Internal Medicine | Admitting: Internal Medicine

## 2020-08-21 ENCOUNTER — Other Ambulatory Visit: Payer: Self-pay

## 2020-08-21 ENCOUNTER — Other Ambulatory Visit (HOSPITAL_COMMUNITY): Payer: Self-pay | Admitting: Internal Medicine

## 2020-08-21 DIAGNOSIS — E11621 Type 2 diabetes mellitus with foot ulcer: Secondary | ICD-10-CM

## 2020-08-21 DIAGNOSIS — L97509 Non-pressure chronic ulcer of other part of unspecified foot with unspecified severity: Secondary | ICD-10-CM

## 2020-08-21 DIAGNOSIS — E1169 Type 2 diabetes mellitus with other specified complication: Secondary | ICD-10-CM | POA: Diagnosis not present

## 2020-08-21 DIAGNOSIS — M869 Osteomyelitis, unspecified: Secondary | ICD-10-CM

## 2020-08-29 ENCOUNTER — Encounter (HOSPITAL_BASED_OUTPATIENT_CLINIC_OR_DEPARTMENT_OTHER): Payer: Medicare HMO | Admitting: Internal Medicine

## 2020-08-29 ENCOUNTER — Other Ambulatory Visit: Payer: Self-pay

## 2020-08-29 ENCOUNTER — Other Ambulatory Visit: Payer: Self-pay | Admitting: *Deleted

## 2020-08-29 DIAGNOSIS — I739 Peripheral vascular disease, unspecified: Secondary | ICD-10-CM

## 2020-08-29 DIAGNOSIS — E785 Hyperlipidemia, unspecified: Secondary | ICD-10-CM | POA: Diagnosis not present

## 2020-08-29 DIAGNOSIS — E11621 Type 2 diabetes mellitus with foot ulcer: Secondary | ICD-10-CM | POA: Diagnosis not present

## 2020-08-29 DIAGNOSIS — E1151 Type 2 diabetes mellitus with diabetic peripheral angiopathy without gangrene: Secondary | ICD-10-CM | POA: Diagnosis not present

## 2020-08-29 DIAGNOSIS — L97519 Non-pressure chronic ulcer of other part of right foot with unspecified severity: Secondary | ICD-10-CM | POA: Diagnosis not present

## 2020-08-29 DIAGNOSIS — I10 Essential (primary) hypertension: Secondary | ICD-10-CM | POA: Diagnosis not present

## 2020-08-29 DIAGNOSIS — E1140 Type 2 diabetes mellitus with diabetic neuropathy, unspecified: Secondary | ICD-10-CM | POA: Diagnosis not present

## 2020-08-29 DIAGNOSIS — G4730 Sleep apnea, unspecified: Secondary | ICD-10-CM | POA: Diagnosis not present

## 2020-08-29 DIAGNOSIS — I2510 Atherosclerotic heart disease of native coronary artery without angina pectoris: Secondary | ICD-10-CM | POA: Diagnosis not present

## 2020-08-29 DIAGNOSIS — Z794 Long term (current) use of insulin: Secondary | ICD-10-CM | POA: Diagnosis not present

## 2020-08-29 NOTE — Progress Notes (Signed)
[name], [name] ([id number]) Visit Report for 08/29/2020 Chief Complaint Document Details Patient Name: Date of Service: [name], [name]. 08/29/2020 1:00 PM Medical Record Number: [id number] Patient Account Number: [id number] Date of Birth/Sex: Treating RN: 05-26-1950 (70 y.o. [name] Primary Care Provider: Nita Sells Other Clinician: Referring Provider: Treating Provider/Extender: Tomasita Crumble in Treatment: 7 Information Obtained from: Patient Chief Complaint the patient arrives for evaluation of a wound on his first right metatarsal head, plantar aspect Electronic Signature(s) Signed: 08/29/2020 2:18:21 PM By: Geralyn Corwin DO Entered By: Geralyn Corwin on 08/29/2020 14:13:59 -------------------------------------------------------------------------------- Debridement Details Patient Name: Date of Service: [name]. 08/29/2020 1:00 PM Medical Record Number: [id number] Patient Account Number: [id number] Date of Birth/Sex: Treating RN: [birth year] (70 y.o. [name] Primary Care Provider: Nita Sells Other Clinician: Referring Provider: Treating Provider/Extender: Tomasita Crumble in Treatment: 7 Debridement Performed for Assessment: Wound #4 Right Metatarsal head first Performed By: Physician Geralyn Corwin, DO Debridement Type: Debridement Severity of Tissue Pre Debridement: Fat layer exposed Level of Consciousness (Pre-procedure): Awake and Alert Pre-procedure Verification/Time Out Yes - 13:48 Taken: Start Time: 13:49 Pain Control: Lidocaine 4% T opical Solution T Area Debrided (L x W): otal 0.5 (cm) x 0.5 (cm) = 0.25 (cm) Tissue and other material debrided: Viable, Non-Viable, Callus, Subcutaneous, Skin: Dermis , Skin: Epidermis, Fibrin/Exudate Level: Skin/Subcutaneous Tissue Debridement Description: Excisional Instrument: Curette Bleeding: Minimum Hemostasis Achieved: Pressure End Time:  13:53 Procedural Pain: 0 Post Procedural Pain: 0 Response to Treatment: Procedure was tolerated well Level of Consciousness (Post- Awake and Alert procedure): Post Debridement Measurements of Total Wound Length: (cm) 0.2 Width: (cm) 0.2 Depth: (cm) 0.2 Volume: (cm) 0.006 Character of Wound/Ulcer Post Debridement: Improved Severity of Tissue Post Debridement: Fat layer exposed Post Procedure Diagnosis Same as Pre-procedure Electronic Signature(s) Signed: 08/29/2020 2:18:21 PM By: Geralyn Corwin DO Signed: 08/29/2020 6:02:39 PM By: Shawn Stall Entered By: Shawn Stall on 08/29/2020 13:53:33 -------------------------------------------------------------------------------- HPI Details Patient Name: Date of Service: [name]. 08/29/2020 1:00 PM Medical Record Number: [id number] Patient Account Number: [id number] Date of Birth/Sex: Treating RN: 03-23-1951 (70 y.o. [name] Primary Care Provider: Nita Sells Other Clinician: Referring Provider: Treating Provider/Extender: Tomasita Crumble in Treatment: 7 History of Present Illness HPI Description: 07/12/19 [name] is a 70 year old male with a past medical history of insulin-dependent type 2 diabetes, essential hypertension and coronary artery disease status post CABG in 2009. He has been seen in our clinic on three separate occasions for diabetic foot wounds. Last seen in 2018 for a left fifth metatarsal head diabetic foot ulcer. This was treated with silver alginate and offloading. Other previous visits were in 2009 and 2015. Patient states that 2 months ago he noticed a blister to the bottom of his foot. He reports using a foot pedal and applying constant pressure to this area for 3 hours a day. 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 He has noticed minimal drainage to the wound bed and denies any pain,  swelling or erythema to the right foot. 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 In the past 2 months he was seen at Podiatry in [place], [place] and [facility] [facility] for this issue. He states Music therapist obtained an x-ray recently and reports it showed no  significant findings. He was started on a cream for which he can not remember the name. He states he uses this with daily dressing changes. He was not satisfied with his care at either location and has since decided to follow-up with [place]. Overall he feels well 4/14; Patient presents for his 1 week follow-up. He reports no issues in the past week. He has been using his surgical shoe and has been aggressive with offloading. He is pleased with the progress of his wound healing. He denies any drainage, increased warmth or erythema to the foot. 4/21; Patient presents for 1 week follow-up. He has been using silver alginate every other day along with a surgical shoe. He denies drainage, increased warmth or erythema to the foot. 4/28; patient presents for 1 week follow-up. He has been using silver alginate every other day along with his surgical shoe. He has try to aggressively offload the area in the past week. At this time he would like to continue with current therapy as he is pleased with the progress thus far. He would like to only do a contact cast if this were to worsen. He states that he has 5 more days off before he has to go to work. And will be trying to offload it aggressively in the next week as well. He denies drainage, increased warmth or erythema to the foot. 5/5; patient presents for 1 week follow-up. He started back at work and has been trying to offload the best he can. He continues to use silver alginate with his surgical shoe. He has no new issues or complaints today. He denies signs of infection. 5/12; patient presents for 1 week follow-up. He has been  using silver alginate every other day. He tries to offload this area the best he can despite work and having to be on his feet. He has no issues or complaints today. He denies any signs of infection. 5/26; patient presents for 2-week follow-up. He has been using silver alginate to the wound. He denies a signs of infection. He has no issues or complaints today. He had ABIs with TBI's with an abnormal TBI on the right where the wound is. He has an appointment with vein and vascular on 6/6. Electronic Signature(s) Signed: 08/29/2020 2:18:21 PM By: Geralyn Corwin DO Entered By: Geralyn Corwin on 08/29/2020 14:15:05 -------------------------------------------------------------------------------- Physical Exam Details Patient Name: Date of Service: [name]. 08/29/2020 1:00 PM Medical Record Number: [id number] Patient Account Number: [id number] Date of Birth/Sex: Treating RN: Jul 03, 1950 (70 y.o. [name] Primary Care Provider: Nita Sells Other Clinician: Referring Provider: Treating Provider/Extender: Tomasita Crumble in Treatment: 7 Constitutional respirations regular, non-labored and within target range for patient.. Cardiovascular 2+ dorsalis pedis/posterior tibialis pulses. Psychiatric pleasant and cooperative. Notes Right foot: Metatarsal first head with small open wound and circumferential callus. This was removed and granulation tissue was present at the wound bed. No signs of infection. Electronic Signature(s) Signed: 08/29/2020 2:18:21 PM By: Geralyn Corwin DO Entered By: Geralyn Corwin on 08/29/2020 14:15:35 -------------------------------------------------------------------------------- Physician Orders Details Patient Name: Date of Service: [name]. 08/29/2020 1:00 PM Medical Record Number: [id number] Patient Account Number: [id number] Date of Birth/Sex: Treating RN: December 06, 1950 (70 y.o. [name] Primary Care Provider:  Nita Sells Other Clinician: Referring Provider: Treating Provider/Extender: Tomasita Crumble in Treatment: 7 Verbal / Phone Orders: No Diagnosis Coding ICD-10 Coding Code Description E11.621 Type 2 diabetes mellitus with foot ulcer E11.40 Type 2 diabetes mellitus with  diabetic neuropathy, unspecified I25.10 Atherosclerotic heart disease of native coronary artery without angina pectoris I10 Essential (primary) hypertension Follow-up Appointments ppointment in 1 week. - Dr. Mikey Bussing Return A Bathing/ Shower/ Hygiene May shower and wash wound with soap and water. - with dressing changes only. Off-Loading Wedge shoe to: - patient to wear front offloading wedge shoe. Wound Treatment Wound #4 - Metatarsal head first Wound Laterality: Right Cleanser: Soap and Water 1 x Per Day Discharge Instructions: May shower and wash wound with dial antibacterial soap and water prior to dressing change. Peri-Wound Care: Skin Prep 1 x Per Day Discharge Instructions: Use skin prep as directed Prim Dressing: KerraCel Ag Gelling Fiber Dressing, 2x2 in (silver alginate) 1 x Per Day ary Discharge Instructions: Apply silver alginate to wound bed as instructed Secondary Dressing: Woven Gauze Sponges 2x2 in 1 x Per Day Discharge Instructions: Apply over primary dressing as directed. Secondary Dressing: Optifoam Non-Adhesive Dressing, 4x4 in 1 x Per Day Discharge Instructions: **Foam donut** Apply over primary dressing as directed. Secured With: Insurance underwriter, Sterile 2x75 (in/in) 1 x Per Day Discharge Instructions: Secure with stretch gauze as directed. Secured With: 29M Medipore H Soft Cloth Surgical T 4 x 2 (in/yd) 1 x Per Day ape Discharge Instructions: Secure dressing with tape as directed. Notes Vein and Vascular consultation 09/09/2020 Monday. Electronic Signature(s) Signed: 08/29/2020 2:18:21 PM By: Geralyn Corwin DO Entered By: Geralyn Corwin on 08/29/2020  14:15:49 -------------------------------------------------------------------------------- Problem List Details Patient Name: Date of Service: [name]. 08/29/2020 1:00 PM Medical Record Number: [id number] Patient Account Number: [id number] Date of Birth/Sex: Treating RN: [birth year] (70 y.o. [name] Primary Care Provider: Nita Sells Other Clinician: Referring Provider: Treating Provider/Extender: Tomasita Crumble in Treatment: 7 Active Problems ICD-10 Encounter Code Description Active Date MDM Diagnosis E11.621 Type 2 diabetes mellitus with foot ulcer 07/11/2020 No Yes E11.40 Type 2 diabetes mellitus with diabetic neuropathy, unspecified 07/11/2020 No Yes I25.10 Atherosclerotic heart disease of native coronary artery without angina pectoris 07/11/2020 No Yes I10 Essential (primary) hypertension 07/11/2020 No Yes Inactive Problems Resolved Problems Electronic Signature(s) Signed: 08/29/2020 2:18:21 PM By: Geralyn Corwin DO Entered By: Geralyn Corwin on 08/29/2020 14:13:47 -------------------------------------------------------------------------------- Progress Note Details Patient Name: Date of Service: [name]. 08/29/2020 1:00 PM Medical Record Number: [id number] Patient Account Number: [id number] Date of Birth/Sex: Treating RN: [birth year] (70 y.o. [name] Primary Care Provider: Nita Sells Other Clinician: Referring Provider: Treating Provider/Extender: Tomasita Crumble in Treatment: 7 Subjective Chief Complaint Information obtained from Patient the patient arrives for evaluation of a wound on his first right metatarsal head, plantar aspect History of Present Illness (HPI) 07/12/19 [name] is a 70 year old male with a past medical history of insulin-dependent type 2 diabetes, essential hypertension and coronary artery disease status post CABG in 2009. He has been seen in our clinic on three separate  occasions for diabetic foot wounds. Last seen in 2018 for a left fifth metatarsal head diabetic foot ulcer. This was treated with silver alginate and offloading. Other previous visits were in 2009 and 2015. Patient states that 2 months ago he noticed a blister to the bottom of his foot. He reports using a foot pedal and applying constant pressure to this area for 3 hours a day. 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 He has noticed minimal drainage to the wound bed and  denies any pain, swelling or erythema to the right foot. 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 In the past 2 months he was seen [place] and [facility] for this issue. He states [facility] obtained an x-ray recently and reports it showed no significant findings. He was started on a cream for which he can not remember the name. He states he uses this with daily dressing changes. He was not satisfied with his care at either location and has since decided to follow-up with us. Overall he feels well 4/14; Patient presents for his 1 week follow-up. He reports no issues in the past week. He has been using his surgical shoe and has been aggressive with offloading. He is pleased with the progress of his wound healing. He denies any drainage, increased warmth or erythema to the foot. 4/21; Patient presents for 1 week follow-up. He has been using silver alginate every other day along with a surgical shoe. He denies drainage, increased warmth or erythema to the foot. 4/28; patient presents for 1 week follow-up. He has been using silver alginate every other day along with his surgical shoe. He has try to aggressively offload the area in the past week. At this time he would like to continue with current therapy as he is pleased with the progress  thus far. He would like to only do a contact cast if this were to worsen. He states that he has 5 more days off before he has to go to work. And will be trying to offload it aggressively in the next week as well. He denies drainage, increased warmth or erythema to the foot. 5/5; patient presents for 1 week follow-up. He started back at work and has been trying to offload the best he can. He continues to use silver alginate with his surgical shoe. He has no new issues or complaints today. He denies signs of infection. 5/12; patient presents for 1 week follow-up. He has been using silver alginate every other day. He tries to offload this area the best he can despite work and having to be on his feet. He has no issues or complaints today. He denies any signs of infection. 5/26; patient presents for 2-week follow-up. He has been using silver alginate to the wound. He denies a signs of infection. He has no issues or complaints today. He had ABIs with TBI's with an abnormal TBI on the right where the wound is. He has an appointment with vein and vascular on 6/6. Patient History Information obtained from Patient. Family History Cancer - Mother, Diabetes - Father,Siblings, Heart Disease - Father,Siblings, Hypertension - Mother,Father,Siblings, No family history of Hereditary Spherocytosis, Kidney Disease, Lung Disease, Seizures, Stroke, Thyroid Problems, Tuberculosis. Social History Never smoker, Marital Status - Married, Alcohol Use - Never, Drug Use - No History, Caffeine Use - Daily - coffee. Medical History Eyes Patient has history of Cataracts - removed Denies history of Glaucoma, Optic Neuritis Ear/Nose/Mouth/Throat Denies history of Chronic sinus problems/congestion, Middle ear problems Hematologic/Lymphatic Denies history of Anemia, Hemophilia, Human Immunodeficiency Virus, Lymphedema, Sickle Cell Disease Respiratory Patient has history of Sleep Apnea Denies history of Aspiration, Asthma,  Chronic Obstructive Pulmonary Disease (COPD), Pneumothorax, Tuberculosis Cardiovascular Patient has history of Coronary Artery Disease - CABG x5, Hypertension, Peripheral Arterial Disease Denies history of Angina, Arrhythmia, Congestive Heart Failure, Deep Vein Thrombosis, Hypotension, Myocardial Infarction, Peripheral Venous Disease, Phlebitis, Vasculitis Gastrointestinal Denies  history of Cirrhosis , Colitis, Crohnoos, Hepatitis A, Hepatitis B, Hepatitis C Endocrine Patient has history of Type II Diabetes Denies history of Type I Diabetes Genitourinary Denies history of End Stage Renal Disease Immunological Denies history of Lupus Erythematosus, Raynaudoos, Scleroderma Integumentary (Skin) Denies history of History of Burn Musculoskeletal Denies history of Gout, Rheumatoid Arthritis, Osteoarthritis, Osteomyelitis Neurologic Patient has history of Neuropathy Denies history of Dementia, Quadriplegia, Paraplegia, Seizure Disorder Oncologic Denies history of Received Chemotherapy, Received Radiation Psychiatric Denies history of Anorexia/bulimia, Confinement Anxiety Hospitalization/Surgery History - coronoary artery bypass. - cardiac cath. - back surgery. - appendectomy. - tonsillectomy. Medical A Surgical History Notes nd Cardiovascular CVA, hyperlipidemia, carotid stenosis Gastrointestinal colon polyps Genitourinary Renal Insufficiency Integumentary (Skin) (L) foot ulcer - Objective Constitutional respirations regular, non-labored and within target range for patient.. Vitals Time Taken: 1:06 PM, Height: 70 in, Weight: 225 lbs, BMI: 32.3, Temperature: 98.3 F, Pulse: 65 bpm, Respiratory Rate: 18 breaths/min, Blood Pressure: 128/71 mmHg, Capillary Blood Glucose: 99 mg/dl. Cardiovascular 2+ dorsalis pedis/posterior tibialis pulses. Psychiatric pleasant and cooperative. General Notes: Right foot: Metatarsal first head with small open wound and circumferential callus. This  was removed and granulation tissue was present at the wound bed. No signs of infection. Integumentary (Hair, Skin) Wound #4 status is Open. Original cause of wound was Blister. The date acquired was: 05/07/2020. The wound has been in treatment 7 weeks. The wound is located on the Right Metatarsal head first. The wound measures 0.2cm length x 0.2cm width x 0.3cm depth; 0.031cm^2 area and 0.009cm^3 volume. There is Fat Layer (Subcutaneous Tissue) exposed. There is no tunneling or undermining noted. There is a medium amount of serosanguineous drainage noted. The wound margin is well defined and not attached to the wound base. There is large (67-100%) red, pink granulation within the wound bed. There is no necrotic tissue within the wound bed. General Notes: Calloused Periwound Assessment Active Problems ICD-10 Type 2 diabetes mellitus with foot ulcer Type 2 diabetes mellitus with diabetic neuropathy, unspecified Atherosclerotic heart disease of native coronary artery without angina pectoris Essential (primary) hypertension Patient wound is stable. No signs of infection. Due to abnormal TBIs I referred him to vein and vascular. He has an appointment on 6/6. I debrided the nonviable tissue and there is healthy granular tissue underneath. We will continue with silver alginate. We discussed aggressive offloading. He may benefit from a total contact cast as this has been unchanged over several weeks. Procedures Wound #4 Pre-procedure diagnosis of Wound #4 is a Diabetic Wound/Ulcer of the Lower Extremity located on the Right Metatarsal head first .Severity of Tissue Pre Debridement is: Fat layer exposed. There was a Excisional Skin/Subcutaneous Tissue Debridement with a total area of 0.25 sq cm performed by Geralyn Corwin, DO. With the following instrument(s): Curette to remove Viable and Non-Viable tissue/material. Material removed includes Callus, Subcutaneous Tissue, Skin: Dermis, Skin: Epidermis,  and Fibrin/Exudate after achieving pain control using Lidocaine 4% Topical Solution. A time out was conducted at 13:48, prior to the start of the procedure. A Minimum amount of bleeding was controlled with Pressure. The procedure was tolerated well with a pain level of 0 throughout and a pain level of 0 following the procedure. Post Debridement Measurements: 0.2cm length x 0.2cm width x 0.2cm depth; 0.006cm^3 volume. Character of Wound/Ulcer Post Debridement is improved. Severity of Tissue Post Debridement is: Fat layer exposed. Post procedure Diagnosis Wound #4: Same as Pre-Procedure Plan Follow-up Appointments: Return Appointment in 1 week. - Dr. Mikey Bussing Bathing/ Shower/ Hygiene: May  shower and wash wound with soap and water. - with dressing changes only. Off-Loading: Wedge shoe to: - patient to wear front offloading wedge shoe. General Notes: Vein and Vascular consultation 09/09/2020 Monday. WOUND #4: - Metatarsal head first Wound Laterality: Right Cleanser: Soap and Water 1 x Per Day/ Discharge Instructions: May shower and wash wound with dial antibacterial soap and water prior to dressing change. Peri-Wound Care: Skin Prep 1 x Per Day/ Discharge Instructions: Use skin prep as directed Prim Dressing: KerraCel Ag Gelling Fiber Dressing, 2x2 in (silver alginate) 1 x Per Day/ ary Discharge Instructions: Apply silver alginate to wound bed as instructed Secondary Dressing: Woven Gauze Sponges 2x2 in 1 x Per Day/ Discharge Instructions: Apply over primary dressing as directed. Secondary Dressing: Optifoam Non-Adhesive Dressing, 4x4 in 1 x Per Day/ Discharge Instructions: **Foam donut** Apply over primary dressing as directed. Secured With: Insurance underwriter, Sterile 2x75 (in/in) 1 x Per Day/ Discharge Instructions: Secure with stretch gauze as directed. Secured With: 38M Medipore H Soft Cloth Surgical T 4 x 2 (in/yd) 1 x Per Day/ ape Discharge Instructions: Secure dressing  with tape as directed. 1. Silver alginate 2. In office sharp debridement 3. Follow-up in a week Electronic Signature(s) Signed: 08/29/2020 2:18:21 PM By: Geralyn Corwin DO Entered By: Geralyn Corwin on 08/29/2020 14:17:27 -------------------------------------------------------------------------------- HxROS Details Patient Name: Date of Service: [name]. 08/29/2020 1:00 PM Medical Record Number: [id number] Patient Account Number: [id number] Date of Birth/Sex: Treating RN: Aug 17, 1950 (70 y.o. [name] Primary Care Provider: Nita Sells Other Clinician: Referring Provider: Treating Provider/Extender: Tomasita Crumble in Treatment: 7 Information Obtained From Patient Eyes Medical History: Positive for: Cataracts - removed Negative for: Glaucoma; Optic Neuritis Ear/Nose/Mouth/Throat Medical History: Negative for: Chronic sinus problems/congestion; Middle ear problems Hematologic/Lymphatic Medical History: Negative for: Anemia; Hemophilia; Human Immunodeficiency Virus; Lymphedema; Sickle Cell Disease Respiratory Medical History: Positive for: Sleep Apnea Negative for: Aspiration; Asthma; Chronic Obstructive Pulmonary Disease (COPD); Pneumothorax; Tuberculosis Cardiovascular Medical History: Positive for: Coronary Artery Disease - CABG x5; Hypertension; Peripheral Arterial Disease Negative for: Angina; Arrhythmia; Congestive Heart Failure; Deep Vein Thrombosis; Hypotension; Myocardial Infarction; Peripheral Venous Disease; Phlebitis; Vasculitis Past Medical History Notes: CVA, hyperlipidemia, carotid stenosis Gastrointestinal Medical History: Negative for: Cirrhosis ; Colitis; Crohns; Hepatitis A; Hepatitis B; Hepatitis C Past Medical History Notes: colon polyps Endocrine Medical History: Positive for: Type II Diabetes Negative for: Type I Diabetes Time with diabetes: over 10 years Treated with: Insulin, Oral agents Blood sugar  tested every day: Yes Tested : 2-3 times a day Blood sugar testing results: Breakfast: 160 Genitourinary Medical History: Negative for: End Stage Renal Disease Past Medical History Notes: Renal Insufficiency Immunological Medical History: Negative for: Lupus Erythematosus; Raynauds; Scleroderma Integumentary (Skin) Medical History: Negative for: History of Burn Past Medical History Notes: (L) foot ulcer - Musculoskeletal Medical History: Negative for: Gout; Rheumatoid Arthritis; Osteoarthritis; Osteomyelitis Neurologic Medical History: Positive for: Neuropathy Negative for: Dementia; Quadriplegia; Paraplegia; Seizure Disorder Oncologic Medical History: Negative for: Received Chemotherapy; Received Radiation Psychiatric Medical History: Negative for: Anorexia/bulimia; Confinement Anxiety HBO Extended History Items Eyes: Cataracts Immunizations Pneumococcal Vaccine: Received Pneumococcal Vaccination: No Implantable Devices No devices added Hospitalization / Surgery History Type of Hospitalization/Surgery coronoary artery bypass cardiac cath back surgery appendectomy tonsillectomy Family and Social History Cancer: Yes - Mother; Diabetes: Yes - Father,Siblings; Heart Disease: Yes - Father,Siblings; Hereditary Spherocytosis: No; Hypertension: Yes - Mother,Father,Siblings; Kidney Disease: No; Lung Disease: No; Seizures: No; Stroke: No; Thyroid Problems: No; Tuberculosis: No; Never smoker;  Marital Status - Married; Alcohol Use: Never; Drug Use: No History; Caffeine Use: Daily - coffee; Financial Concerns: No; Food, Clothing or Shelter Needs: No; Support System Lacking: No; Transportation Concerns: No Electronic Signature(s) Signed: 08/29/2020 2:18:21 PM By: Geralyn Corwin DO Signed: 08/29/2020 6:02:39 PM By: Shawn Stall Entered By: Geralyn Corwin on 08/29/2020 14:15:13 -------------------------------------------------------------------------------- SuperBill  Details Patient Name: Date of Service: [name]. 08/29/2020 Medical Record Number: [id number] Patient Account Number: [id number] Date of Birth/Sex: Treating RN: September 28, 1950 (69 y.o. [name] Primary Care Provider: Nita Sells Other Clinician: Referring Provider: Treating Provider/Extender: Tomasita Crumble in Treatment: 7 Diagnosis Coding ICD-10 Codes Code Description [phone] Type 2 diabetes mellitus with foot ulcer E11.40 Type 2 diabetes mellitus with diabetic neuropathy, unspecified I25.10 Atherosclerotic heart disease of native coronary artery without angina pectoris I10 Essential (primary) hypertension Facility Procedures CPT4 Code: [id number] Description: 11042 - DEB SUBQ TISSUE 20 SQ CM/< ICD-10 Diagnosis Description E11.621 Type 2 diabetes mellitus with foot ulcer Modifier: Quantity: 1 Physician Procedures : CPT4 Code Description Modifier [id number] - WC PHYS LEVEL 3 - EST PT ICD-10 Diagnosis Description E11.621 Type 2 diabetes mellitus with foot ulcer E11.40 Type 2 diabetes mellitus with diabetic neuropathy, unspecified Quantity: 1 Electronic Signature(s) Signed: 08/29/2020 2:18:21 PM By: Geralyn Corwin DO Entered By: Geralyn Corwin on 08/29/2020 14:17:55

## 2020-09-03 NOTE — Progress Notes (Signed)
WITT, PLITT ([id number]) Visit Report for 08/29/2020 Arrival Information Details Patient Name: Date of Service: [name]. 08/29/2020 1:00 PM Medical Record Number: [id number] Patient Account Number: [id number] Date of Birth/Sex: Treating RN: [birth year] (70 y.o. Marcheta Grammes Primary Care Izzy Courville: Allyn Kenner Other Clinician: Referring Avigayil Ton: Treating Emiliano Welshans/Extender: Dierdre Harness in Treatment: 7 Visit Information History Since Last Visit Added or deleted any medications: No Patient Arrived: Ambulatory Any new allergies or adverse reactions: No Arrival Time: 12:58 Had a fall or experienced change in No Transfer Assistance: None activities of daily living that may affect Patient Identification Verified: Yes risk of falls: Secondary Verification Process Completed: Yes Signs or symptoms of abuse/neglect since last visito No Patient Requires Transmission-Based Precautions: No Hospitalized since last visit: No Patient Has Alerts: Yes Implantable device outside of the clinic excluding No Patient Alerts: Patient on Blood Thinner cellular tissue based products placed in the center R ABI non compressible since last visit: Has Dressing in Place as Prescribed: Yes Has Footwear/Offloading in Place as Prescribed: Yes Right: Wedge Shoe Pain Present Now: No Electronic Signature(s) Signed: 08/29/2020 5:35:44 PM By: Lorrin Jackson Entered By: Lorrin Jackson on 08/29/2020 12:59:25 -------------------------------------------------------------------------------- Encounter Discharge Information Details Patient Name: Date of Service: [name]. 08/29/2020 1:00 PM Medical Record Number: [id number] Patient Account Number: [id number] Date of Birth/Sex: Treating RN: 18-Nov-1950 (69 y.o. Janyth Contes Primary Care Niketa Turner: Allyn Kenner Other Clinician: Referring Jailene Cupit: Treating Anthony Tamburo/Extender: Dierdre Harness in  Treatment: 7 Encounter Discharge Information Items Post Procedure Vitals Discharge Condition: Stable Temperature (F): 98.3 Ambulatory Status: Ambulatory Pulse (bpm): 65 Discharge Destination: Home Respiratory Rate (breaths/min): 18 Transportation: Private Auto Blood Pressure (mmHg): 128/71 Accompanied By: alone Schedule Follow-up Appointment: Yes Clinical Summary of Care: Patient Declined Electronic Signature(s) Signed: 09/03/2020 5:56:30 PM By: Levan Hurst RN, BSN Entered By: Levan Hurst on 08/29/2020 17:20:15 -------------------------------------------------------------------------------- Lower Extremity Assessment Details Patient Name: Date of Service: [name]. 08/29/2020 1:00 PM Medical Record Number: [id number] Patient Account Number: [id number] Date of Birth/Sex: Treating RN: 14-Apr-1950 (69 y.o. Marcheta Grammes Primary Care Yzabella Crunk: Allyn Kenner Other Clinician: Referring Cleotha Whalin: Treating Angelle Isais/Extender: Dierdre Harness in Treatment: 7 Edema Assessment Assessed: [Left: No] [Right: Yes] Edema: [Left: N] [Right: o] Calf Left: Right: Point of Measurement: 32 cm From Medial Instep 41.5 cm Ankle Left: Right: Point of Measurement: 11 cm From Medial Instep 25 cm Vascular Assessment Pulses: Dorsalis Pedis Palpable: [Right:Yes] Electronic Signature(s) Signed: 08/29/2020 5:35:44 PM By: Lorrin Jackson Entered By: Lorrin Jackson on 08/29/2020 13:05:54 -------------------------------------------------------------------------------- Multi Wound Chart Details Patient Name: Date of Service: [name]. 08/29/2020 1:00 PM Medical Record Number: [id number] Patient Account Number: [id number] Date of Birth/Sex: Treating RN: 02/13/1951 (69 y.o. [name], [name] Primary Care Monica Zahler: Allyn Kenner Other Clinician: Referring Sadao Weyer: Treating Shamarie Call/Extender: Dierdre Harness in Treatment: 7 Vital  Signs Height(in): 70 Capillary Blood Glucose(mg/dl): 99 Weight(lbs): 225 Pulse(bpm): 3 Body Mass Index(BMI): 51 Blood Pressure(mmHg): 128/71 Temperature(F): 98.3 Respiratory Rate(breaths/min): 18 Photos: [4:No Photos Right Metatarsal head first] [N/A:N/A N/A] Wound Location: [4:Blister] [N/A:N/A] Wounding Event: [4:Diabetic Wound/Ulcer of the Lower] [N/A:N/A] Primary Etiology: [4:Extremity Cataracts, Sleep Apnea, Coronary] [N/A:N/A] Comorbid History: [4:Artery Disease, Hypertension, Peripheral Arterial Disease, Type II Diabetes, Neuropathy 05/07/2020] [N/A:N/A] Date Acquired: [4:7] [N/A:N/A] Weeks of Treatment: [4:Open] [N/A:N/A] Wound Status: [4:0.2x0.2x0.3] [N/A:N/A] Measurements L x W x D (cm) [4:0.031] [N/A:N/A] A (cm) : rea [4:0.009] [N/A:N/A] Volume (cm) : [4:50.80%] [N/A:N/A] % Reduction in  A rea: [4:52.60%] [N/A:N/A] % Reduction in Volume: [4:Grade 2] [N/A:N/A] Classification: [4:Medium] [N/A:N/A] Exudate A mount: [4:Serosanguineous] [N/A:N/A] Exudate Type: [4:red, brown] [N/A:N/A] Exudate Color: [4:Well defined, not attached] [N/A:N/A] Wound Margin: [4:Large (67-100%)] [N/A:N/A] Granulation A mount: [4:Red, Pink] [N/A:N/A] Granulation Quality: [4:None Present (0%)] [N/A:N/A] Necrotic A mount: [4:Fat Layer (Subcutaneous Tissue): Yes N/A] Exposed Structures: [4:Fascia: No Tendon: No Muscle: No Joint: No Bone: No Large (67-100%)] [N/A:N/A] Epithelialization: [4:Debridement - Excisional] [N/A:N/A] Debridement: Pre-procedure Verification/Time Out 13:48 [N/A:N/A] Taken: [4:Lidocaine 4% Topical Solution] [N/A:N/A] Pain Control: [4:Callus, Subcutaneous] [N/A:N/A] Tissue Debrided: [4:Skin/Subcutaneous Tissue] [N/A:N/A] Level: [4:0.25] [N/A:N/A] Debridement A (sq cm): [4:rea Curette] [N/A:N/A] Instrument: [4:Minimum] [N/A:N/A] Bleeding: [4:Pressure] [N/A:N/A] Hemostasis A chieved: [4:0] [N/A:N/A] Procedural Pain: [4:0] [N/A:N/A] Post Procedural Pain: [4:Procedure was  tolerated well] [N/A:N/A] Debridement Treatment Response: [4:0.2x0.2x0.2] [N/A:N/A] Post Debridement Measurements L x W x D (cm) [4:0.006] [N/A:N/A] Post Debridement Volume: (cm) [4:Calloused Periwound] [N/A:N/A] Assessment Notes: [4:Debridement] [N/A:N/A] Treatment Notes Electronic Signature(s) Signed: 08/29/2020 2:18:21 PM By: Kalman Shan DO Signed: 08/29/2020 6:02:39 PM By: Deon Pilling Entered By: Kalman Shan on 08/29/2020 14:13:53 -------------------------------------------------------------------------------- Multi-Disciplinary Care Plan Details Patient Name: Date of Service: [name]. 08/29/2020 1:00 PM Medical Record Number: [id number] Patient Account Number: [id number] Date of Birth/Sex: Treating RN: 08/05/1950 (70 y.o. Hessie Diener Primary Care Nolah Krenzer: Allyn Kenner Other Clinician: Referring Jerene Yeager: Treating Mackensie Pilson/Extender: Dierdre Harness in Treatment: 7 Multidisciplinary Care Plan reviewed with physician Active Inactive Nutrition Nursing Diagnoses: Impaired glucose control: actual or potential Potential for alteratiion in Nutrition/Potential for imbalanced nutrition Goals: Patient/caregiver agrees to and verbalizes understanding of need to use nutritional supplements and/or vitamins as prescribed Date Initiated: 07/11/2020 Date Inactivated: 08/01/2020 Target Resolution Date: 08/09/2020 Goal Status: Met Patient/caregiver will maintain therapeutic glucose control Date Initiated: 07/11/2020 Target Resolution Date: 08/30/2020 Goal Status: Active Interventions: Assess HgA1c results as ordered upon admission and as needed Assess patient nutrition upon admission and as needed per policy Provide education on elevated blood sugars and impact on wound healing Provide education on nutrition Notes: Electronic Signature(s) Signed: 08/29/2020 6:02:39 PM By: Deon Pilling Entered By: Deon Pilling on 08/29/2020  13:05:23 -------------------------------------------------------------------------------- Pain Assessment Details Patient Name: Date of Service: [name]. 08/29/2020 1:00 PM Medical Record Number: [id number] Patient Account Number: [id number] Date of Birth/Sex: Treating RN: 1950-10-02 (70 y.o. Marcheta Grammes Primary Care Nija Koopman: Allyn Kenner Other Clinician: Referring Mariann Palo: Treating Courney Garrod/Extender: Dierdre Harness in Treatment: 7 Active Problems Location of Pain Severity and Description of Pain Patient Has Paino No Site Locations Pain Management and Medication Current Pain Management: Electronic Signature(s) Signed: 08/29/2020 5:35:44 PM By: Lorrin Jackson Entered By: Lorrin Jackson on 08/29/2020 12:59:36 -------------------------------------------------------------------------------- Patient/Caregiver Education Details Patient Name: Date of Service: Wong, RO BERT W. 5/26/2022andnbsp1:00 PM Medical Record Number: [id number] Patient Account Number: [id number] Date of Birth/Gender: Treating RN: 30-Aug-1950 (70 y.o. Hessie Diener Primary Care Physician: Allyn Kenner Other Clinician: Referring Physician: Treating Physician/Extender: Dierdre Harness in Treatment: 7 Education Assessment Education Provided To: Patient Education Topics Provided Nutrition: Handouts: Nutrition Methods: Explain/Verbal Responses: Reinforcements needed Electronic Signature(s) Signed: 08/29/2020 6:02:39 PM By: Deon Pilling Entered By: Deon Pilling on 08/29/2020 13:05:35 -------------------------------------------------------------------------------- Wound Assessment Details Patient Name: Date of Service: [name]. 08/29/2020 1:00 PM Medical Record Number: [id number] Patient Account Number: [id number] Date of Birth/Sex: Treating RN: [birth year] (70 y.o. Marcheta Grammes Primary Care Vinayak Bobier: Allyn Kenner Other  Clinician: Referring Keiry Kowal: Treating Shawnna Pancake/Extender: Dierdre Harness in Treatment: 7  Wound Status Wound Number: 4 Primary Diabetic Wound/Ulcer of the Lower Extremity Etiology: Wound Location: Right Metatarsal head first Wound Open Wounding Event: Blister Status: Date Acquired: 05/07/2020 Comorbid Cataracts, Sleep Apnea, Coronary Artery Disease, Hypertension, Weeks Of Treatment: 7 History: Peripheral Arterial Disease, Type II Diabetes, Neuropathy Clustered Wound: No Photos Wound Measurements Length: (cm) 0.2 Width: (cm) 0.2 Depth: (cm) 0.3 Area: (cm) 0.031 Volume: (cm) 0.009 % Reduction in Area: 50.8% % Reduction in Volume: 52.6% Epithelialization: Large (67-100%) Tunneling: No Undermining: No Wound Description Classification: Grade 2 Wound Margin: Well defined, not attached Exudate Amount: Medium Exudate Type: Serosanguineous Exudate Color: red, brown Foul Odor After Cleansing: No Slough/Fibrino No Wound Bed Granulation Amount: Large (67-100%) Exposed Structure Granulation Quality: Red, Pink Fascia Exposed: No Necrotic Amount: None Present (0%) Fat Layer (Subcutaneous Tissue) Exposed: Yes Tendon Exposed: No Muscle Exposed: No Joint Exposed: No Bone Exposed: No Assessment Notes Calloused Periwound Treatment Notes Wound #4 (Metatarsal head first) Wound Laterality: Right Cleanser Soap and Water Discharge Instruction: May shower and wash wound with dial antibacterial soap and water prior to dressing change. Peri-Wound Care Skin Prep Discharge Instruction: Use skin prep as directed Topical Primary Dressing KerraCel Ag Gelling Fiber Dressing, 2x2 in (silver alginate) Discharge Instruction: Apply silver alginate to wound bed as instructed Secondary Dressing Woven Gauze Sponges 2x2 in Discharge Instruction: Apply over primary dressing as directed. Optifoam Non-Adhesive Dressing, 4x4 in Discharge Instruction: **Foam donut** Apply over  primary dressing as directed. Secured With Conforming Stretch Gauze Bandage, Sterile 2x75 (in/in) Discharge Instruction: Secure with stretch gauze as directed. 29M Medipore H Soft Cloth Surgical T 4 x 2 (in/yd) ape Discharge Instruction: Secure dressing with tape as directed. Compression Wrap Compression Stockings Add-Ons Electronic Signature(s) Signed: 08/29/2020 4:57:49 PM By: Sandre Kitty Signed: 08/29/2020 5:35:44 PM By: Lorrin Jackson Entered By: Sandre Kitty on 08/29/2020 16:43:56 -------------------------------------------------------------------------------- Vitals Details Patient Name: Date of Service: [name]. 08/29/2020 1:00 PM Medical Record Number: [id number] Patient Account Number: [id number] Date of Birth/Sex: Treating RN: 10/07/1950 (70 y.o. Marcheta Grammes Primary Care Yaris Ferrell: Allyn Kenner Other Clinician: Referring Vinette Crites: Treating Akio Hudnall/Extender: Dierdre Harness in Treatment: 7 Vital Signs Time Taken: 13:06 Temperature (F): 98.3 Height (in): 70 Pulse (bpm): 65 Weight (lbs): 225 Respiratory Rate (breaths/min): 18 Body Mass Index (BMI): 32.3 Blood Pressure (mmHg): 128/71 Capillary Blood Glucose (mg/dl): 99 Reference Range: 80 - 120 mg / dl Electronic Signature(s) Signed: 08/29/2020 5:35:44 PM By: Lorrin Jackson Entered By: Lorrin Jackson on 08/29/2020 13:11:53

## 2020-09-05 ENCOUNTER — Encounter (HOSPITAL_BASED_OUTPATIENT_CLINIC_OR_DEPARTMENT_OTHER): Payer: Medicare HMO | Attending: Internal Medicine | Admitting: Internal Medicine

## 2020-09-05 ENCOUNTER — Other Ambulatory Visit: Payer: Self-pay

## 2020-09-05 DIAGNOSIS — Z794 Long term (current) use of insulin: Secondary | ICD-10-CM | POA: Diagnosis not present

## 2020-09-05 DIAGNOSIS — E1140 Type 2 diabetes mellitus with diabetic neuropathy, unspecified: Secondary | ICD-10-CM | POA: Insufficient documentation

## 2020-09-05 DIAGNOSIS — I10 Essential (primary) hypertension: Secondary | ICD-10-CM | POA: Insufficient documentation

## 2020-09-05 DIAGNOSIS — E11621 Type 2 diabetes mellitus with foot ulcer: Secondary | ICD-10-CM | POA: Insufficient documentation

## 2020-09-05 DIAGNOSIS — E1151 Type 2 diabetes mellitus with diabetic peripheral angiopathy without gangrene: Secondary | ICD-10-CM | POA: Diagnosis not present

## 2020-09-05 NOTE — Progress Notes (Signed)
[name] ([id number]) Visit Report for 09/05/2020 Chief Complaint Document Details Patient Name: Date of Service: [name]. 09/05/2020 8:30 A M Medical Record Number: [id number] Patient Account Number: [id number] Date of Birth/Sex: Treating RN: January 10, 1951 (70 y.o. [name] Primary Care Provider: Nita Sells Other Clinician: Referring Provider: Treating Provider/Extender: Tomasita Crumble in Treatment: 8 Information Obtained from: Patient Chief Complaint the patient arrives for evaluation of a wound on his first right metatarsal head, plantar aspect Electronic Signature(s) Signed: 09/05/2020 9:27:30 AM By: Geralyn Corwin DO Entered By: Geralyn Corwin on 09/05/2020 09:20:38 -------------------------------------------------------------------------------- Debridement Details Patient Name: Date of Service: [name]. 09/05/2020 8:30 A M Medical Record Number: [id number] Patient Account Number: [id number] Date of Birth/Sex: Treating RN: 1950/08/17 (70 y.o. Charlean Merl, [name] Primary Care Provider: Nita Sells Other Clinician: Referring Provider: Treating Provider/Extender: Tomasita Crumble in Treatment: 8 Debridement Performed for Assessment: Wound #4 Right Metatarsal head first Performed By: Physician Geralyn Corwin, DO Debridement Type: Debridement Severity of Tissue Pre Debridement: Fat layer exposed Level of Consciousness (Pre-procedure): Awake and Alert Pre-procedure Verification/Time Out Yes - 09:02 Taken: Start Time: 09:02 Pain Control: Lidocaine T Area Debrided (L x W): otal 0.2 (cm) x 0.2 (cm) = 0.04 (cm) Tissue and other material debrided: Viable, Non-Viable, Callus, Subcutaneous, Skin: Dermis , Skin: Epidermis Level: Skin/Subcutaneous Tissue Debridement Description: Excisional Instrument: Curette Bleeding: Minimum Hemostasis Achieved: Pressure End Time: 09:04 Procedural Pain: 0 Post Procedural  Pain: 0 Response to Treatment: Procedure was tolerated well Level of Consciousness (Post- Awake and Alert procedure): Post Debridement Measurements of Total Wound Length: (cm) 0.2 Width: (cm) 0.2 Depth: (cm) 0.3 Volume: (cm) 0.009 Character of Wound/Ulcer Post Debridement: Improved Severity of Tissue Post Debridement: Fat layer exposed Post Procedure Diagnosis Same as Pre-procedure Electronic Signature(s) Signed: 09/05/2020 9:27:30 AM By: Geralyn Corwin DO Signed: 09/05/2020 12:00:56 PM By: Fonnie Mu RN Entered By: Fonnie Mu on 09/05/2020 09:03:14 -------------------------------------------------------------------------------- HPI Details Patient Name: Date of Service: [name]. 09/05/2020 8:30 A M Medical Record Number: [id number] Patient Account Number: [id number] Date of Birth/Sex: Treating RN: 1950/10/29 (70 y.o. [name] Primary Care Provider: Nita Sells Other Clinician: Referring Provider: Treating Provider/Extender: Tomasita Crumble in Treatment: 8 History of Present Illness HPI Description: 07/12/19 [name] is a 70 year old male with a past medical history of insulin-dependent type 2 diabetes, essential hypertension and coronary artery disease status post CABG in 2009. He has been seen in our clinic on three separate occasions for diabetic foot wounds. Last seen in 2018 for a left fifth metatarsal head diabetic foot ulcer. This was treated with silver alginate and offloading. Other previous visits were in 2009 and 2015. Patient states that 2 months ago he noticed a blister to the bottom of his foot. He reports using a foot pedal and applying constant pressure to this area for 3 hours a day. 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 He has noticed minimal drainage to the wound bed and denies any pain, swelling or erythema to the right foot.  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 In the past 2 months he was seen at Podiatry in [place], [place] and [facility] for this issue. He states Music therapist obtained an x-ray recently and reports it showed no significant  findings. He was started on a cream for which he can not remember the name. He states he uses this with daily dressing changes. He was not satisfied with his care at either location and has since decided to follow-up with [place]. Overall he feels well 4/14; Patient presents for his 1 week follow-up. He reports no issues in the past week. He has been using his surgical shoe and has been aggressive with offloading. He is pleased with the progress of his wound healing. He denies any drainage, increased warmth or erythema to the foot. 4/21; Patient presents for 1 week follow-up. He has been using silver alginate every other day along with a surgical shoe. He denies drainage, increased warmth or erythema to the foot. 4/28; patient presents for 1 week follow-up. He has been using silver alginate every other day along with his surgical shoe. He has try to aggressively offload the area in the past week. At this time he would like to continue with current therapy as he is pleased with the progress thus far. He would like to only do a contact cast if this were to worsen. He states that he has 5 more days off before he has to go to work. And will be trying to offload it aggressively in the next week as well. He denies drainage, increased warmth or erythema to the foot. 5/5; patient presents for 1 week follow-up. He started back at work and has been trying to offload the best he can. He continues to use silver alginate with his surgical shoe. He has no new issues or complaints today. He denies signs of infection. 5/12; patient presents for 1 week follow-up. He has been using silver alginate every other day. He  tries to offload this area the best he can despite work and having to be on his feet. He has no issues or complaints today. He denies any signs of infection. 5/26; patient presents for 2-week follow-up. He has been using silver alginate to the wound. He denies a signs of infection. He has no issues or complaints today. He had ABIs with TBI's with an abnormal TBI on the right where the wound is. He has an appointment with vein and vascular on 6/6. 6/2; patient presents for 1 week follow-up. He has been using silver alginate to the wound. He has no issues today. He has an appointment with vein and vascular on 6/6. We discussed potentially doing the cast and he would like to go ahead and try this. Electronic Signature(s) Signed: 09/05/2020 9:27:30 AM By: Geralyn Corwin DO Entered By: Geralyn Corwin on 09/05/2020 09:21:21 -------------------------------------------------------------------------------- Physical Exam Details Patient Name: Date of Service: [name]. 09/05/2020 8:30 A M Medical Record Number: [id number] Patient Account Number: [id number] Date of Birth/Sex: Treating RN: 20-Jan-1951 (70 y.o. [name] Primary Care Provider: Nita Sells Other Clinician: Referring Provider: Treating Provider/Extender: Tomasita Crumble in Treatment: 8 Constitutional respirations regular, non-labored and within target range for patient.. Cardiovascular 2+ dorsalis pedis/posterior tibialis pulses. Psychiatric pleasant and cooperative. Notes Right foot: Metatarsal first head with small open wound and circumferential callus. This was removed and granulation tissue was present at the wound bed. No signs of infection. Electronic Signature(s) Signed: 09/05/2020 9:27:30 AM By: Geralyn Corwin DO Entered By: Geralyn Corwin on 09/05/2020 09:23:31 -------------------------------------------------------------------------------- Physician Orders Details Patient Name: Date of  Service: [name]. 09/05/2020 8:30 A M Medical Record Number: [id number] Patient Account Number: [id number] Date of Birth/Sex: Treating RN: 01-May-1950 (69  y.o. Charlean Merl, [name] Primary Care Provider: Nita Sells Other Clinician: Referring Provider: Treating Provider/Extender: Tomasita Crumble in Treatment: 8 Verbal / Phone Orders: No Diagnosis Coding ICD-10 Coding Code Description E11.621 Type 2 diabetes mellitus with foot ulcer E11.40 Type 2 diabetes mellitus with diabetic neuropathy, unspecified I25.10 Atherosclerotic heart disease of native coronary artery without angina pectoris I10 Essential (primary) hypertension Follow-up Appointments ppointment in 1 week. - with Dr. Mikey Bussing ***Extra time 60 minutes for TCC cast change**** Return A ppointment in: - Appt. on Monday 06/06 after [facility] appt. for TCC to be applied Return A *** EXTRA TIME 60 MINUTES FOR TCC*** Bathing/ Shower/ Hygiene May shower and wash wound with soap and water. - with dressing changes only Off-Loading Wedge shoe to: - patient to wear front offloading wedge shoe. Wound Treatment Wound #4 - Metatarsal head first Wound Laterality: Right Cleanser: Soap and Water 1 x Per Day Discharge Instructions: May shower and wash wound with dial antibacterial soap and water prior to dressing change. Peri-Wound Care: Skin Prep 1 x Per Day Discharge Instructions: Use skin prep as directed Prim Dressing: KerraCel Ag Gelling Fiber Dressing, 2x2 in (silver alginate) ary 1 x Per Day Discharge Instructions: Apply silver alginate to wound bed as instructed Secondary Dressing: Woven Gauze Sponges 2x2 in 1 x Per Day Discharge Instructions: Apply over primary dressing as directed. Secondary Dressing: Optifoam Non-Adhesive Dressing, 4x4 in 1 x Per Day Discharge Instructions: **Foam donut** Apply over primary dressing as directed. Secured With: Insurance underwriter, Sterile 2x75 (in/in) 1 x Per  Day Discharge Instructions: Secure with stretch gauze as directed. Secured With: 105M Medipore H Soft Cloth Surgical T 4 x 2 (in/yd) 1 x Per Day ape Discharge Instructions: Secure dressing with tape as directed. Electronic Signature(s) Signed: 09/05/2020 9:27:30 AM By: Geralyn Corwin DO Entered By: Geralyn Corwin on 09/05/2020 09:23:49 -------------------------------------------------------------------------------- Problem List Details Patient Name: Date of Service: [name]. 09/05/2020 8:30 A M Medical Record Number: [id number] Patient Account Number: [id number] Date of Birth/Sex: Treating RN: 1951-04-02 (70 y.o. [name] Primary Care Provider: Nita Sells Other Clinician: Referring Provider: Treating Provider/Extender: Tomasita Crumble in Treatment: 8 Active Problems ICD-10 Encounter Code Description Active Date MDM Diagnosis E11.621 Type 2 diabetes mellitus with foot ulcer 07/11/2020 No Yes E11.40 Type 2 diabetes mellitus with diabetic neuropathy, unspecified 07/11/2020 No Yes I25.10 Atherosclerotic heart disease of native coronary artery without angina pectoris 07/11/2020 No Yes I10 Essential (primary) hypertension 07/11/2020 No Yes Inactive Problems Resolved Problems Electronic Signature(s) Signed: 09/05/2020 9:27:30 AM By: Geralyn Corwin DO Entered By: Geralyn Corwin on 09/05/2020 09:20:18 -------------------------------------------------------------------------------- Progress Note Details Patient Name: Date of Service: [place] W. 09/05/2020 8:30 A M Medical Record Number: [id number] Patient Account Number: [id number] Date of Birth/Sex: Treating RN: 03/26/1951 (70 y.o. [name] Primary Care Provider: Nita Sells Other Clinician: Referring Provider: Treating Provider/Extender: Tomasita Crumble in Treatment: 8 Subjective Chief Complaint Information obtained from Patient the patient arrives for evaluation of  a wound on his first right metatarsal head, plantar aspect History of Present Illness (HPI) 07/12/19 [name] is a 70 year old male with a past medical history of insulin-dependent type 2 diabetes, essential hypertension and coronary artery disease status post CABG in 2009. He has been seen in our clinic on three separate occasions for diabetic foot wounds. Last seen in 2018 for a left fifth metatarsal head diabetic foot ulcer. This was treated with silver alginate and offloading. Other  previous visits were in 2009 and 2015. Patient states that 2 months ago he noticed a blister to the bottom of his foot. He reports using a foot pedal and applying constant pressure to this area for 3 hours a day. 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 He has noticed minimal drainage to the wound bed and denies any pain, swelling or erythema to the right foot. 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 In the past 2 months he was [place] [facility] for this issue. He states [facility] obtained an x-ray recently and reports it showed no significant findings. He was started on a cream for which he can not remember the name. He states he uses this with daily dressing changes. He was not satisfied with his care at either location and has since decided to follow-up with us. Overall he feels well 4/14; Patient presents for his 1 week follow-up. He reports no issues in the past week. He has been using his surgical shoe and has been aggressive with offloading. He is pleased with the progress of his wound healing. He denies any drainage, increased warmth or erythema to the foot. 4/21; Patient presents for 1 week follow-up. He has been using silver alginate every other day along with a surgical shoe. He denies drainage,  increased warmth or erythema to the foot. 4/28; patient presents for 1 week follow-up. He has been using silver alginate every other day along with his surgical shoe. He has try to aggressively offload the area in the past week. At this time he would like to continue with current therapy as he is pleased with the progress thus far. He would like to only do a contact cast if this were to worsen. He states that he has 5 more days off before he has to go to work. And will be trying to offload it aggressively in the next week as well. He denies drainage, increased warmth or erythema to the foot. 5/5; patient presents for 1 week follow-up. He started back at work and has been trying to offload the best he can. He continues to use silver alginate with his surgical shoe. He has no new issues or complaints today. He denies signs of infection. 5/12; patient presents for 1 week follow-up. He has been using silver alginate every other day. He tries to offload this area the best he can despite work and having to be on his feet. He has no issues or complaints today. He denies any signs of infection. 5/26; patient presents for 2-week follow-up. He has been using silver alginate to the wound. He denies a signs of infection. He has no issues or complaints today. He had ABIs with TBI's with an abnormal TBI on the right where the wound is. He has an appointment with vein and vascular on 6/6. 6/2; patient presents for 1 week follow-up. He has been using silver alginate to the wound. He has no issues today. He has an appointment with vein and vascular on 6/6. We discussed potentially doing the cast and he would like to go ahead and try this. Patient History Information obtained from Patient. Family History Cancer - Mother, Diabetes - Father,Siblings, Heart Disease - Father,Siblings, Hypertension - Mother,Father,Siblings, No family history of Hereditary Spherocytosis, Kidney Disease, Lung Disease,  Seizures, Stroke,  Thyroid Problems, Tuberculosis. Social History Never smoker, Marital Status - Married, Alcohol Use - Never, Drug Use - No History, Caffeine Use - Daily - coffee. Medical History Eyes Patient has history of Cataracts - removed Denies history of Glaucoma, Optic Neuritis Ear/Nose/Mouth/Throat Denies history of Chronic sinus problems/congestion, Middle ear problems Hematologic/Lymphatic Denies history of Anemia, Hemophilia, Human Immunodeficiency Virus, Lymphedema, Sickle Cell Disease Respiratory Patient has history of Sleep Apnea Denies history of Aspiration, Asthma, Chronic Obstructive Pulmonary Disease (COPD), Pneumothorax, Tuberculosis Cardiovascular Patient has history of Coronary Artery Disease - CABG x5, Hypertension, Peripheral Arterial Disease Denies history of Angina, Arrhythmia, Congestive Heart Failure, Deep Vein Thrombosis, Hypotension, Myocardial Infarction, Peripheral Venous Disease, Phlebitis, Vasculitis Gastrointestinal Denies history of Cirrhosis , Colitis, Crohnoos, Hepatitis A, Hepatitis B, Hepatitis C Endocrine Patient has history of Type II Diabetes Denies history of Type I Diabetes Genitourinary Denies history of End Stage Renal Disease Immunological Denies history of Lupus Erythematosus, Raynaudoos, Scleroderma Integumentary (Skin) Denies history of History of Burn Musculoskeletal Denies history of Gout, Rheumatoid Arthritis, Osteoarthritis, Osteomyelitis Neurologic Patient has history of Neuropathy Denies history of Dementia, Quadriplegia, Paraplegia, Seizure Disorder Oncologic Denies history of Received Chemotherapy, Received Radiation Psychiatric Denies history of Anorexia/bulimia, Confinement Anxiety Hospitalization/Surgery History - coronoary artery bypass. - cardiac cath. - back surgery. - appendectomy. - tonsillectomy. Medical A Surgical History Notes nd Cardiovascular CVA, hyperlipidemia, carotid stenosis Gastrointestinal colon  polyps Genitourinary Renal Insufficiency Integumentary (Skin) (L) foot ulcer - Objective Constitutional respirations regular, non-labored and within target range for patient.. Vitals Time Taken: 8:42 AM, Height: 70 in, Weight: 225 lbs, BMI: 32.3, Temperature: 98.4 F, Pulse: 77 bpm, Respiratory Rate: 16 breaths/min, Blood Pressure: 134/67 mmHg, Capillary Blood Glucose: 159 mg/dl. General Notes: Glucose per patient report Cardiovascular 2+ dorsalis pedis/posterior tibialis pulses. Psychiatric pleasant and cooperative. General Notes: Right foot: Metatarsal first head with small open wound and circumferential callus. This was removed and granulation tissue was present at the wound bed. No signs of infection. Integumentary (Hair, Skin) Wound #4 status is Open. Original cause of wound was Blister. The date acquired was: 05/07/2020. The wound has been in treatment 8 weeks. The wound is located on the Right Metatarsal head first. The wound measures 0.2cm length x 0.2cm width x 0.3cm depth; 0.031cm^2 area and 0.009cm^3 volume. There is Fat Layer (Subcutaneous Tissue) exposed. There is no tunneling or undermining noted. There is a medium amount of serosanguineous drainage noted. The wound margin is well defined and not attached to the wound base. There is large (67-100%) red, pink granulation within the wound bed. There is no necrotic tissue within the wound bed. General Notes: Calloused Periwound Assessment Active Problems ICD-10 Type 2 diabetes mellitus with foot ulcer Type 2 diabetes mellitus with diabetic neuropathy, unspecified Atherosclerotic heart disease of native coronary artery without angina pectoris Essential (primary) hypertension Patient wound is stable. No signs of infection. Nonviable tissue and callus was debrided. We discussed the cast today and patient seemed interested. He would like to try this for at least 4 weeks. We discussed that he would not be able to drive since this  is using his right foot. He he states he has somebody that can drive him in the meantime. We will plan for the cast following his vein and vascular appointment pending their assessment. Procedures Wound #4 Pre-procedure diagnosis of Wound #4 is a Diabetic Wound/Ulcer of the Lower Extremity located on the Right Metatarsal head first .Severity of Tissue Pre Debridement is: Fat layer exposed. There was a  Excisional Skin/Subcutaneous Tissue Debridement with a total area of 0.04 sq cm performed by Geralyn Corwin, DO. With the following instrument(s): Curette to remove Viable and Non-Viable tissue/material. Material removed includes Callus, Subcutaneous Tissue, Skin: Dermis, and Skin: Epidermis after achieving pain control using Lidocaine. No specimens were taken. A time out was conducted at 09:02, prior to the start of the procedure. A Minimum amount of bleeding was controlled with Pressure. The procedure was tolerated well with a pain level of 0 throughout and a pain level of 0 following the procedure. Post Debridement Measurements: 0.2cm length x 0.2cm width x 0.3cm depth; 0.009cm^3 volume. Character of Wound/Ulcer Post Debridement is improved. Severity of Tissue Post Debridement is: Fat layer exposed. Post procedure Diagnosis Wound #4: Same as Pre-Procedure Plan Follow-up Appointments: Return Appointment in 1 week. - with Dr. Mikey Bussing ***Extra time 60 minutes for TCC cast change**** Return Appointment in: - Appt. on Monday 06/06 after [facility] appt. for TCC to be applied *** EXTRA TIME 60 MINUTES FOR TCC*** Bathing/ Shower/ Hygiene: May shower and wash wound with soap and water. - with dressing changes only Off-Loading: Wedge shoe to: - patient to wear front offloading wedge shoe. WOUND #4: - Metatarsal head first Wound Laterality: Right Cleanser: Soap and Water 1 x Per Day/ Discharge Instructions: May shower and wash wound with dial antibacterial soap and water prior to dressing change. Peri-Wound  Care: Skin Prep 1 x Per Day/ Discharge Instructions: Use skin prep as directed Prim Dressing: KerraCel Ag Gelling Fiber Dressing, 2x2 in (silver alginate) 1 x Per Day/ ary Discharge Instructions: Apply silver alginate to wound bed as instructed Secondary Dressing: Woven Gauze Sponges 2x2 in 1 x Per Day/ Discharge Instructions: Apply over primary dressing as directed. Secondary Dressing: Optifoam Non-Adhesive Dressing, 4x4 in 1 x Per Day/ Discharge Instructions: **Foam donut** Apply over primary dressing as directed. Secured With: Insurance underwriter, Sterile 2x75 (in/in) 1 x Per Day/ Discharge Instructions: Secure with stretch gauze as directed. Secured With: 58M Medipore H Soft Cloth Surgical T 4 x 2 (in/yd) 1 x Per Day/ ape Discharge Instructions: Secure dressing with tape as directed. 1. Silver alginate every other day 2. Follow-up next week Electronic Signature(s) Signed: 09/05/2020 9:27:30 AM By: Geralyn Corwin DO Entered By: Geralyn Corwin on 09/05/2020 09:26:58 -------------------------------------------------------------------------------- HxROS Details Patient Name: Date of Service: [name]. 09/05/2020 8:30 A M Medical Record Number: [id number] Patient Account Number: [id number] Date of Birth/Sex: Treating RN: [birth year] (70 y.o. [name] Primary Care Provider: Nita Sells Other Clinician: Referring Provider: Treating Provider/Extender: Tomasita Crumble in Treatment: 8 Information Obtained From Patient Eyes Medical History: Positive for: Cataracts - removed Negative for: Glaucoma; Optic Neuritis Ear/Nose/Mouth/Throat Medical History: Negative for: Chronic sinus problems/congestion; Middle ear problems Hematologic/Lymphatic Medical History: Negative for: Anemia; Hemophilia; Human Immunodeficiency Virus; Lymphedema; Sickle Cell Disease Respiratory Medical History: Positive for: Sleep Apnea Negative for: Aspiration;  Asthma; Chronic Obstructive Pulmonary Disease (COPD); Pneumothorax; Tuberculosis Cardiovascular Medical History: Positive for: Coronary Artery Disease - CABG x5; Hypertension; Peripheral Arterial Disease Negative for: Angina; Arrhythmia; Congestive Heart Failure; Deep Vein Thrombosis; Hypotension; Myocardial Infarction; Peripheral Venous Disease; Phlebitis; Vasculitis Past Medical History Notes: CVA, hyperlipidemia, carotid stenosis Gastrointestinal Medical History: Negative for: Cirrhosis ; Colitis; Crohns; Hepatitis A; Hepatitis B; Hepatitis C Past Medical History Notes: colon polyps Endocrine Medical History: Positive for: Type II Diabetes Negative for: Type I Diabetes Time with diabetes: over 10 years Treated with: Insulin, Oral agents Blood sugar tested every day: Yes Tested :  2-3 times a day Blood sugar testing results: Breakfast: 160 Genitourinary Medical History: Negative for: End Stage Renal Disease Past Medical History Notes: Renal Insufficiency Immunological Medical History: Negative for: Lupus Erythematosus; Raynauds; Scleroderma Integumentary (Skin) Medical History: Negative for: History of Burn Past Medical History Notes: (L) foot ulcer - Musculoskeletal Medical History: Negative for: Gout; Rheumatoid Arthritis; Osteoarthritis; Osteomyelitis Neurologic Medical History: Positive for: Neuropathy Negative for: Dementia; Quadriplegia; Paraplegia; Seizure Disorder Oncologic Medical History: Negative for: Received Chemotherapy; Received Radiation Psychiatric Medical History: Negative for: Anorexia/bulimia; Confinement Anxiety HBO Extended History Items Eyes: Cataracts Immunizations Pneumococcal Vaccine: Received Pneumococcal Vaccination: No Implantable Devices No devices added Hospitalization / Surgery History Type of Hospitalization/Surgery coronoary artery bypass cardiac cath back surgery appendectomy tonsillectomy Family and Social  History Cancer: Yes - Mother; Diabetes: Yes - Father,Siblings; Heart Disease: Yes - Father,Siblings; Hereditary Spherocytosis: No; Hypertension: Yes - Mother,Father,Siblings; Kidney Disease: No; Lung Disease: No; Seizures: No; Stroke: No; Thyroid Problems: No; Tuberculosis: No; Never smoker; Marital Status - Married; Alcohol Use: Never; Drug Use: No History; Caffeine Use: Daily - coffee; Financial Concerns: No; Food, Clothing or Shelter Needs: No; Support System Lacking: No; Transportation Concerns: No Electronic Signature(s) Signed: 09/05/2020 9:27:30 AM By: Geralyn Corwin DO Signed: 09/05/2020 5:16:10 PM By: Shawn Stall Entered By: Geralyn Corwin on 09/05/2020 09:21:29 -------------------------------------------------------------------------------- SuperBill Details Patient Name: Date of Service: [name]. 09/05/2020 Medical Record Number: [id number] Patient Account Number: [id number] Date of Birth/Sex: Treating RN: February 14, 1951 (70 y.o. [name] Primary Care Provider: Nita Sells Other Clinician: Referring Provider: Treating Provider/Extender: Tomasita Crumble in Treatment: 8 Diagnosis Coding ICD-10 Codes Code Description [phone] Type 2 diabetes mellitus with foot ulcer E11.40 Type 2 diabetes mellitus with diabetic neuropathy, unspecified I25.10 Atherosclerotic heart disease of native coronary artery without angina pectoris I10 Essential (primary) hypertension Facility Procedures CPT4 Code: [id number] Description: 11042 - DEB SUBQ TISSUE 20 SQ CM/< ICD-10 Diagnosis Description E11.621 Type 2 diabetes mellitus with foot ulcer Modifier: Quantity: 1 Physician Procedures Electronic Signature(s) Signed: 09/05/2020 9:27:30 AM By: Geralyn Corwin DO Entered By: Geralyn Corwin on 09/05/2020 09:27:08

## 2020-09-06 NOTE — Progress Notes (Signed)
[name], [name] ([id number]) Visit Report for 09/05/2020 Arrival Information Details Patient Name: Date of Service: [name], [name]. 09/05/2020 8:30 A M Medical Record Number: [id number] Patient Account Number: [id number] Date of Birth/Sex: Treating RN: 07/02/1950 (71 y.o. [name]: Allyn Kenner Other Clinician: Referring Jesusmanuel Erbes: Treating Keeley Sussman/Extender: Dierdre Harness in Treatment: 8 Visit Information History Since Last Visit Added or deleted any medications: No Patient Arrived: Ambulatory Any new allergies or adverse reactions: No Arrival Time: 08:41 Had a fall or experienced change in No Transfer Assistance: None activities of daily living that [name] affect Patient Identification Verified: Yes risk of falls: Secondary Verification Process Completed: Yes Signs or symptoms of abuse/neglect since last visito No Patient Requires Transmission-Based Precautions: No Hospitalized since last visit: No Patient Has Alerts: Yes Implantable device outside of the clinic excluding No Patient Alerts: Patient on Blood Thinner cellular tissue based products placed in the center R ABI non compressible since last visit: Has Dressing in Place as Prescribed: Yes Has Footwear/Offloading in Place as Prescribed: Yes Right: Wedge Shoe Pain Present Now: No Electronic Signature(s) Signed: 09/05/2020 5:54:05 PM By: Lorrin Jackson Entered By: Lorrin Jackson on 09/05/2020 08:42:57 -------------------------------------------------------------------------------- Lower Extremity Assessment Details Patient Name: Date of Service: [name]. 09/05/2020 8:30 A M Medical Record Number: [id number] Patient Account Number: [id number] Date of Birth/Sex: Treating RN: 1950-07-27 (70 y.o. [name] Primary Care [name]: Allyn Kenner Other Clinician: Referring Child Campoy: Treating Daniyla Pfahler/Extender: Dierdre Harness in Treatment:  8 Edema Assessment Assessed: [Left: No] [Right: Yes] Edema: [Left: N] [Right: o] Calf Left: Right: Point of Measurement: 32 cm From Medial Instep 42 cm Ankle Left: Right: Point of Measurement: 11 cm From Medial Instep 26.3 cm Vascular Assessment Pulses: Dorsalis Pedis Palpable: [Right:Yes] Electronic Signature(s) Signed: 09/05/2020 5:54:05 PM By: Lorrin Jackson Entered By: Lorrin Jackson on 09/05/2020 08:49:38 -------------------------------------------------------------------------------- Multi Wound Chart Details Patient Name: Date of Service: [name]. 09/05/2020 8:30 A M Medical Record Number: [id number] Patient Account Number: [id number] Date of Birth/Sex: Treating RN: 1951/02/18 (70 y.o. [name]: Allyn Kenner Other Clinician: Referring Emeri Estill: Treating Jobanny Mavis/Extender: Dierdre Harness in Treatment: 8 Vital Signs Height(in): 70 Capillary Blood Glucose(mg/dl): 159 Weight(lbs): 225 Pulse(bpm): 63 Body Mass Index(BMI): 68 Blood Pressure(mmHg): 134/67 Temperature(F): 98.4 Respiratory Rate(breaths/min): 16 Photos: [4:No Photos Right Metatarsal head first] [N/A:N/A N/A] Wound Location: [4:Blister] [N/A:N/A] Wounding Event: [4:Diabetic Wound/Ulcer of the Lower] [N/A:N/A] Primary Etiology: [4:Extremity Cataracts, Sleep Apnea, Coronary] [N/A:N/A] Comorbid History: [4:Artery Disease, Hypertension, Peripheral Arterial Disease, Type II Diabetes, Neuropathy 05/07/2020] [N/A:N/A] Date Acquired: [4:8] [N/A:N/A] Weeks of Treatment: [4:Open] [N/A:N/A] Wound Status: [4:0.2x0.2x0.3] [N/A:N/A] Measurements L x W x D (cm) [4:0.031] [N/A:N/A] A (cm) : rea [4:0.009] [N/A:N/A] Volume (cm) : [4:50.80%] [N/A:N/A] % Reduction in A [4:rea: 52.60%] [N/A:N/A] % Reduction in Volume: [4:Grade 2] [N/A:N/A] Classification: [4:Medium] [N/A:N/A] Exudate A mount: [4:Serosanguineous] [N/A:N/A] Exudate Type: [4:red, brown]  [N/A:N/A] Exudate Color: [4:Well defined, not attached] [N/A:N/A] Wound Margin: [4:Large (67-100%)] [N/A:N/A] Granulation A mount: [4:Red, Pink] [N/A:N/A] Granulation Quality: [4:None Present (0%)] [N/A:N/A] Necrotic A mount: [4:Fat Layer (Subcutaneous Tissue): Yes N/A] Exposed Structures: [4:Fascia: No Tendon: No Muscle: No Joint: No Bone: No Large (67-100%)] [N/A:N/A] Epithelialization: [4:Debridement - Excisional] [N/A:N/A] Debridement: Pre-procedure Verification/Time Out 09:02 [N/A:N/A] Taken: [4:Lidocaine] [N/A:N/A] Pain Control: [4:Callus, Subcutaneous] [N/A:N/A] Tissue Debrided: [4:Skin/Subcutaneous Tissue] [N/A:N/A] Level: [4:0.04] [N/A:N/A] Debridement A (sq cm): [4:rea Curette] [N/A:N/A] Instrument: [4:Minimum] [N/A:N/A] Bleeding: [4:Pressure] [N/A:N/A] Hemostasis A chieved: [4:0] [  N/A:N/A] Procedural Pain: [4:0] [N/A:N/A] Post Procedural Pain: [4:Procedure was tolerated well] [N/A:N/A] Debridement Treatment Response: [4:0.2x0.2x0.3] [N/A:N/A] Post Debridement Measurements L x W x D (cm) [4:0.009] [N/A:N/A] Post Debridement Volume: (cm) [4:Calloused Periwound] [N/A:N/A] Assessment Notes: [4:Debridement] [N/A:N/A] Treatment Notes Electronic Signature(s) Signed: 09/05/2020 9:27:30 AM By: Kalman Shan DO Signed: 09/05/2020 5:16:10 PM By: Deon Pilling Entered By: Kalman Shan on 09/05/2020 09:20:28 -------------------------------------------------------------------------------- Multi-Disciplinary Care Plan Details Patient Name: Date of Service: [name]. 09/05/2020 8:30 A M Medical Record Number: [id number] Patient Account Number: [id number] Date of Birth/Sex: Treating RN: [name] Primary Care [name]: Allyn Kenner Other Clinician: Referring [name]: Treating Joushua Dugar/Extender: Dierdre Harness in Treatment: 8 Multidisciplinary Care Plan reviewed with physician Active Inactive Nutrition Nursing  Diagnoses: Impaired glucose control: actual or potential Potential for alteratiion in Nutrition/Potential for imbalanced nutrition Goals: Patient/caregiver agrees to and verbalizes understanding of need to use nutritional supplements and/or vitamins as prescribed Date Initiated: 07/11/2020 Date Inactivated: 08/01/2020 Target Resolution Date: 08/09/2020 Goal Status: Met Patient/caregiver will maintain therapeutic glucose control Date Initiated: 07/11/2020 Target Resolution Date: 10/03/2020 Goal Status: Active Interventions: Assess HgA1c results as ordered upon admission and as needed Assess patient nutrition upon admission and as needed per policy Provide education on elevated blood sugars and impact on wound healing Provide education on nutrition Treatment Activities: Education provided on Nutrition : 08/29/2020 Notes: Electronic Signature(s) Signed: 09/05/2020 12:00:56 PM By: Rhae Hammock RN Entered By: Rhae Hammock on 09/05/2020 09:09:13 -------------------------------------------------------------------------------- Pain Assessment Details Patient Name: Date of Service: [name]. 09/05/2020 8:30 A M Medical Record Number: [id number] Patient Account Number: [id number] Date of Birth/Sex: Treating RN: 1950/11/18 (70 y.o. [name] Primary Care Srikar Chiang: Allyn Kenner Other Clinician: Referring Kalleigh [place]: Treating Ashelyn Mccravy/Extender: Dierdre Harness in Treatment: 8 Active Problems Location of Pain Severity and Description of Pain Patient Has Paino No Site Locations Pain Management and Medication Current Pain Management: Electronic Signature(s) Signed: 09/05/2020 5:54:05 PM By: Lorrin Jackson Entered By: Lorrin Jackson on 09/05/2020 08:47:03 -------------------------------------------------------------------------------- Patient/Caregiver Education Details Patient Name: Date of Service: [name], [name] BERT W. 6/2/2022andnbsp8:30 [place]  Record Number: [id number] Patient Account Number: [id number] Date of Birth/Gender: Treating RN: 07-20-1950 (70 y.o. Erie Noe Primary Care Physician: Allyn Kenner Other Clinician: Referring Physician: Treating Physician/Extender: Dierdre Harness in Treatment: 8 Education Assessment Education Provided To: Patient Education Topics Provided Wound/Skin Impairment: Methods: Explain/Verbal Responses: State content correctly Electronic Signature(s) Signed: 09/05/2020 12:00:56 PM By: Rhae Hammock RN Entered By: Rhae Hammock on 09/05/2020 09:09:30 -------------------------------------------------------------------------------- Wound Assessment Details Patient Name: Date of Service: [name]. 09/05/2020 8:30 A M Medical Record Number: [id number] Patient Account Number: [id number] Date of Birth/Sex: Treating RN: 1951-02-26 (70 y.o. [name] Primary Care Derren Suydam: Allyn Kenner Other Clinician: Referring Tanor Glaspy: Treating Cregg Jutte/Extender: Dierdre Harness in Treatment: 8 Wound Status Wound Number: 4 Primary Diabetic Wound/Ulcer of the Lower Extremity Etiology: Wound Location: Right Metatarsal head first Wound Open Wounding Event: Blister Status: Date Acquired: 05/07/2020 Comorbid Cataracts, Sleep Apnea, Coronary Artery Disease, Hypertension, Weeks Of Treatment: 8 History: Peripheral Arterial Disease, Type II Diabetes, Neuropathy Clustered Wound: No Photos Wound Measurements Length: (cm) 0.2 Width: (cm) 0.2 Depth: (cm) 0.3 Area: (cm) 0.031 Volume: (cm) 0.009 % Reduction in Area: 50.8% % Reduction in Volume: 52.6% Epithelialization: Large (67-100%) Tunneling: No Undermining: No Wound Description Classification: Grade 2 Wound Margin: Well defined, not attached Exudate Amount: Medium Exudate Type: Serosanguineous Exudate Color: red,  brown Foul Odor After Cleansing: No Slough/Fibrino No Wound  Bed Granulation Amount: Large (67-100%) Exposed Structure Granulation Quality: Red, Pink Fascia Exposed: No Necrotic Amount: None Present (0%) Fat Layer (Subcutaneous Tissue) Exposed: Yes Tendon Exposed: No Muscle Exposed: No Joint Exposed: No Bone Exposed: No Assessment Notes Calloused Periwound Electronic Signature(s) Signed: 09/05/2020 5:54:05 PM By: Lorrin Jackson Signed: 09/06/2020 5:48:41 PM By: Sandre Kitty Entered By: Sandre Kitty on 09/05/2020 16:40:23 -------------------------------------------------------------------------------- [place] Details Patient Name: Date of Service: [name]. 09/05/2020 8:30 A M Medical Record Number: [id number] Patient Account Number: [id number] Date of Birth/Sex: Treating RN: [birth year] (70 y.o. [name] Primary Care Taevon Aschoff: Allyn Kenner Other Clinician: Referring Henryk Ursin: Treating Bryanne Riquelme/Extender: Dierdre Harness in Treatment: 8 Vital Signs Time Taken: 08:42 Temperature (F): 98.4 Height (in): 70 Pulse (bpm): 77 Weight (lbs): 225 Respiratory Rate (breaths/min): 16 Body Mass Index (BMI): 32.3 Blood Pressure (mmHg): 134/67 Capillary Blood Glucose (mg/dl): 159 Reference Range: 80 - 120 mg / dl Notes Glucose per patient report Electronic Signature(s) Signed: 09/05/2020 5:54:05 PM By: Lorrin Jackson Entered By: Lorrin Jackson on 09/05/2020 08:46:44

## 2020-09-09 ENCOUNTER — Other Ambulatory Visit: Payer: Self-pay

## 2020-09-09 ENCOUNTER — Ambulatory Visit (INDEPENDENT_AMBULATORY_CARE_PROVIDER_SITE_OTHER): Payer: Medicare HMO | Admitting: Vascular Surgery

## 2020-09-09 ENCOUNTER — Encounter: Payer: Self-pay | Admitting: Vascular Surgery

## 2020-09-09 ENCOUNTER — Encounter (HOSPITAL_BASED_OUTPATIENT_CLINIC_OR_DEPARTMENT_OTHER): Payer: Medicare HMO | Admitting: Internal Medicine

## 2020-09-09 ENCOUNTER — Ambulatory Visit: Payer: Medicare HMO

## 2020-09-09 VITALS — BP 110/64 | HR 76 | Temp 97.7°F | Resp 16 | Ht 70.0 in | Wt 237.0 lb

## 2020-09-09 DIAGNOSIS — I739 Peripheral vascular disease, unspecified: Secondary | ICD-10-CM

## 2020-09-09 DIAGNOSIS — E1140 Type 2 diabetes mellitus with diabetic neuropathy, unspecified: Secondary | ICD-10-CM

## 2020-09-09 DIAGNOSIS — I10 Essential (primary) hypertension: Secondary | ICD-10-CM | POA: Diagnosis not present

## 2020-09-09 DIAGNOSIS — E11621 Type 2 diabetes mellitus with foot ulcer: Secondary | ICD-10-CM | POA: Diagnosis not present

## 2020-09-09 DIAGNOSIS — E1151 Type 2 diabetes mellitus with diabetic peripheral angiopathy without gangrene: Secondary | ICD-10-CM | POA: Diagnosis not present

## 2020-09-09 DIAGNOSIS — Z794 Long term (current) use of insulin: Secondary | ICD-10-CM | POA: Diagnosis not present

## 2020-09-09 NOTE — Progress Notes (Signed)
[facility] [place]  Patient name: [name] MRN: [id number] DOB: 1950-05-25 Sex: male  REASON FOR CONSULT: Evaluation lower extremity arterial flow with ulceration of the right first metatarsal head  HPI: [name] is a 70 y.o. male, who is here today for evaluation.  He is here today with his wife.  He has a long history of insulin-dependent diabetes.  He has a 58-monthhistory of ulceration over his right first metatarsal head.  He reports that this occurred while at work.  He was doing a job that required repetitive pressing with a right foot pedal and developed a blister.  He reports that he has had similar episode on both feet which eventually healed spontaneously.  He is in wound care has had several sessions of debridement of this and is going for a noncontact boot later today.  He does not have any septic episodes.  [place]  Does have history of prior coronary disease dating back to coronary bypass in 2009.  Past Medical History:  Diagnosis Date  . Allergy   . CAD (coronary artery disease), native coronary artery    Multiple PCI procedures followed by CABG in 2009 with LIMA-LAD, SVG-D, seq SVG-OM and PLV, SVG-PDA. Echo (3/12): EF 60-65%, mild MR. // Nuc Stress Test 7/19:  EF 50, artifact, no ischemia, low risk   . Carotid artery disease ([facility] 01/31[place]   dopplers 1/14:  0-39% bilat  . Carotid stenosis    dopplers 1/14:  0-39% bilat  . Cataract    REMOVED  . Colon polyps 2006   Colonoscopy-Dr. Patterson(Tubular Adenoma)   . Coronary Artery Disease 06/03/2008   Annotation: S/P CABG 2009 Qualifier: Diagnosis of  By: BOlevia Perches MD, FGlenetta Hew  . DM2 (diabetes mellitus, type 2) ([place]    Followed by Dr. CCarlis Abbott . Essential hypertension 06/03/2008   Qualifier: Diagnosis of  By: BOlevia Perches MD, FGlenetta Hew  . History of echocardiogram    Echo 7/19: Mild  LVH, EF 540-98 normal diastolic function, trivial AI, moderate LAE  . HLD (hyperlipidemia)   . HTN (hypertension)   . Hx of cardiovascular stress test    ETT-Myoview (03/2013):  Anteroseptal reversible defect, EF 54%, intermediate risk.  .Marland KitchenHyperlipidemia 06/03/2008   Qualifier: Diagnosis of  By: BOlevia Perches MD, FGlenetta Hew  . [place]schemic stroke ([place] 01/30/2019  . OSA (obstructive sleep apnea) 08/25/2011  . PAD (peripheral artery disease) ([place] 12/10/2017  . [place]operative atrial fibrillation ([place] 2009  . Renal insufficiency 01/31/2019  . Stroke ([place]   . Type 2 diabetes mellitus with complication, with long-term current use of insulin ([place] 06/03/2008   Qualifier: Diagnosis of  By: BOlevia Perches MD, FGlenetta Hew  . Ulcer    left foot  . Varicose veins of lower extremities with other complications 91/04/9145   Family History  Problem Relation Age of Onset  . Coronary artery disease Other        family history  . Heart disease Other        family history  . Other Brother        CABG  . Other Brother        CABG  . Prostate cancer Father   . Diabetes Father   . Heart disease Father   . Hyperlipidemia Father   . Hypertension Father   . Prostate cancer Brother   . Diabetes Brother  x 2  . Colon cancer Mother   . Cancer Mother   . Diabetes Mother   . Heart disease Mother   . Hyperlipidemia Mother     SOCIAL HISTORY: Social History   Socioeconomic History  . Marital status: Married    Spouse name: Not on file  . Number of children: 0  . Years of education: Not on file  . Highest education level: Not on file  Occupational History  . Occupation: Radio broadcast assistant    Comment: Owner  Tobacco Use  . Smoking status: Never Smoker  . Smokeless tobacco: Former Systems developer    Types: Secondary school teacher  . Vaping Use: Never used  Substance and Sexual Activity  . Alcohol use: No  . Drug use: No  . Sexual activity: Not Currently  Other Topics Concern  . 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   Originally from [place].   Social Determinants of Health   Financial Resource Strain: Not on file  Food Insecurity: Not on file  Transportation Needs: Not on file  Physical Activity: Not on file  Stress: Not on file  Social Connections: Not on file  Intimate Partner Violence: Not on file    Allergies  Allergen Reactions  . Penicillins Other (See Comments)    Other reaction(s): Other (See Comments) Unknown-Reaction as child Unknown-Reaction as child    Current Outpatient Medications  Medication Sig Dispense Refill  . amitriptyline (ELAVIL) 75 MG tablet Take 75 mg by mouth at bedtime.    [name] amLODipine (NORVASC) 10 MG tablet TAKE 1 TABLET BY MOUTH EVERY DAY 90 tablet 2  . aspirin EC 325 MG EC tablet Take 1 tablet (325 mg total) by mouth daily. 30 tablet 0  . clopidogrel (PLAVIX) 75 MG tablet TAKE 1 TABLET BY MOUTH EVERY DAY 90 tablet 2  . ezetimibe (ZETIA) 10 MG tablet TAKE 1 TABLET BY MOUTH EVERY DAY 90 tablet 2  . fenofibrate 54 MG tablet TAKE 1 TABLET BY MOUTH EVERY DAY 90 tablet 2  . furosemide (LASIX) 40 MG tablet TAKE 1 TABLET BY MOUTH EVERY DAY 90 tablet 2  . glucose monitoring kit (FREESTYLE) monitoring kit 1 each by Does not apply route as needed for other. Freestyle Libre    . isosorbide mononitrate (IMDUR) 30 MG 24 hr tablet TAKE 1.5 TABLETS (45 MG TOTAL) BY MOUTH DAILY. 135 tablet 2  . metFORMIN (GLUCOPHAGE) 1000 MG tablet Take 1,000 mg by mouth 2 (two) times daily with a meal.    . metoprolol tartrate (LOPRESSOR) 100 MG tablet TAKE 1 TABLET BY MOUTH TWICE A DAY 180 tablet 2  . nitroGLYCERIN (NITROSTAT) 0.4 MG SL tablet PLACE 1 TABLET UNDER THE TONGUE EVERY 5 MINUTES AS NEEDED FOR CHEST PAIN 25 tablet 4  . NOVOLOG MIX 70/30 FLEXPEN (70-30) 100 UNIT/ML Pen Inject 40-60 Units into the skin 2 (two) times daily. Per sliding scale    . OZEMPIC, 0.25 OR 0.5 MG/DOSE, 2 MG/1.5ML  SOPN Inject 1 unit into the skin per week    . rosuvastatin (CRESTOR) 20 MG tablet TAKE 1 TABLET BY MOUTH EVERY DAY 90 tablet 2   No current facility-administered medications for this visit.    REVIEW OF SYSTEMS:  [X] denotes positive finding, [ ] denotes negative finding Cardiac  Comments:  Chest pain or chest pressure:    Shortness of breath upon exertion:    Short of breath when  lying flat:    Irregular heart rhythm:        Vascular    Pain in calf, thigh, or hip brought on by ambulation:    Pain in feet at night that wakes you up from your sleep:     Blood clot in your veins:    Leg swelling:         Pulmonary    Oxygen at home:    Productive cough:     Wheezing:         Neurologic    Sudden weakness in arms or legs:     Sudden numbness in arms or legs:     Sudden onset of difficulty speaking or slurred speech:    Temporary loss of vision in one eye:     Problems with dizziness:         Gastrointestinal    Blood in stool:     Vomited blood:         Genitourinary    Burning when urinating:     Blood in urine:        Psychiatric    Major depression:         Hematologic    Bleeding problems:    Problems with blood clotting too easily:        Skin    Rashes or ulcers:        Constitutional    Fever or chills:      PHYSICAL EXAM: Vitals:   09/09/20 0904  BP: 110/64  Pulse: 76  Resp: 16  Temp: 97.7 F (36.5 C)  TempSrc: Other (Comment)  SpO2: 94%  Weight: 237 lb (107.5 kg)  Height: 5' 10" (1.778 m)    GENERAL: The patient is a well-nourished male, in no acute distress. The vital signs are documented above. CARDIOVASCULAR: Palpable popliteal pulses bilaterally.  I do not palpate pedal pulses bilaterally. PULMONARY: There is good air exchange  MUSCULOSKELETAL: There are no major deformities or cyanosis. NEUROLOGIC: No focal weakness or paresthesias are detected. SKIN: There is a small callus and ulceration approximately 4 mm in diameter over the  right first metatarsal head.  No surrounding erythema and no evidence of tracking or fluctuance PSYCHIATRIC: The patient has a normal affect.  DATA:  Noninvasive studies from 08/21/2020 were reviewed.  This showed noncompressible vessels bilaterally.  He did have triphasic waveforms bilaterally.  Toe brachial index was diminished on the right and normal on the left.  With hand-held Doppler he does have monophasic flow at the level of the posterior tibial, anterior tibial and peroneal on the right  MEDICAL ISSUES: Recurrent episodes of ulceration over the first metatarsal head on the right.  Is being seen by wound care.  Is going to place a noncontact boot today.  I discussed his physical findings of palpable popliteal pulse and absent pedal pulses with calcified vessels by noninvasive studies.  I have recommended arteriography for further evaluation of his tibial disease.  Patient reports that he wishes to continue conservative treatment and wishes to defer arteriography.  Feel that this is acceptable to try conservative therapy a bit longer.  I did encourage him to notify us immediately should he have any erythema or worsening of his ulceration.  I did discuss with the patient and his wife that this certainly could progress to a limb threatening situation very quickly.  He will continue follow-up with wound care and notify us if he wishes any further evaluation should this persist  Rosetta Posner, MD FACS [facility] [phone] Pager [phone]  Note: Portions of this report may have been transcribed using voice recognition software.  Every effort has been made to ensure accuracy; however, inadvertent computerized transcription errors may still be present.

## 2020-09-11 DIAGNOSIS — E1165 Type 2 diabetes mellitus with hyperglycemia: Secondary | ICD-10-CM | POA: Diagnosis not present

## 2020-09-11 NOTE — Progress Notes (Signed)
DEREKE, NEUMANN ([id number]) Visit Report for 09/09/2020 Chief Complaint Document Details Patient Name: Date of Service: KIEFFER, BLATZ. 09/09/2020 11:30 A M Medical Record Number: [id number] Patient Account Number: [id number] Date of Birth/Sex: Treating RN: 16-Mar-1951 (70 y.o. M) Primary Care Provider: Nita Sells Other Clinician: Referring Provider: Treating Provider/Extender: Tomasita Crumble in Treatment: 8 Information Obtained from: Patient Chief Complaint the patient arrives for evaluation of a wound on his first right metatarsal head, plantar aspect Electronic Signature(s) Signed: 09/09/2020 12:05:54 PM By: Geralyn Corwin DO Entered By: Geralyn Corwin on 09/09/2020 11:49:24 -------------------------------------------------------------------------------- Debridement Details Patient Name: Date of Service: [name]. 09/09/2020 11:30 A M Medical Record Number: [id number] Patient Account Number: [id number] Date of Birth/Sex: Treating RN: [birth year] (69 y.o. Elizebeth Koller Primary Care Provider: Nita Sells Other Clinician: Referring Provider: Treating Provider/Extender: Tomasita Crumble in Treatment: 8 Debridement Performed for Assessment: Wound #4 Right Metatarsal head first Performed By: Physician Geralyn Corwin, DO Debridement Type: Debridement Severity of Tissue Pre Debridement: Fat layer exposed Level of Consciousness (Pre-procedure): Awake and Alert Pre-procedure Verification/Time Out Yes - 11:01 Taken: Start Time: 11:01 T Area Debrided (L x W): otal 0.5 (cm) x 0.4 (cm) = 0.2 (cm) Tissue and other material debrided: Non-Viable, Callus, Subcutaneous Level: Skin/Subcutaneous Tissue Debridement Description: Excisional Instrument: Curette Bleeding: Minimum Hemostasis Achieved: Pressure End Time: 11:02 Procedural Pain: 0 Post Procedural Pain: 0 Response to Treatment: Procedure was tolerated well Level of Consciousness  (Post- Awake and Alert procedure): Post Debridement Measurements of Total Wound Length: (cm) 0.5 Width: (cm) 0.4 Depth: (cm) 0.3 Volume: (cm) 0.047 Character of Wound/Ulcer Post Debridement: Stable Severity of Tissue Post Debridement: Fat layer exposed Post Procedure Diagnosis Same as Pre-procedure Electronic Signature(s) Signed: 09/09/2020 12:05:54 PM By: Geralyn Corwin DO Signed: 09/11/2020 5:57:05 PM By: Zandra Abts RN, BSN Entered By: Zandra Abts on 09/09/2020 11:03:00 -------------------------------------------------------------------------------- HPI Details Patient Name: Date of Service: [name]. 09/09/2020 11:30 A M Medical Record Number: [id number] Patient Account Number: [id number] Date of Birth/Sex: Treating RN: 06-10-1950 (70 y.o. M) Primary Care Provider: Nita Sells Other Clinician: Referring Provider: Treating Provider/Extender: Tomasita Crumble in Treatment: 8 History of Present Illness HPI Description: 07/12/19 [name] is a 70 year old male with a past medical history of insulin-dependent type 2 diabetes, essential hypertension and coronary artery disease status post CABG in 2009. He has been seen in our clinic on three separate occasions for diabetic foot wounds. Last seen in 2018 for a left fifth metatarsal head diabetic foot ulcer. This was treated with silver alginate and offloading. Other previous visits were in 2009 and 2015. Patient states that 2 months ago he noticed a blister to the bottom of his foot. He reports using a foot pedal and applying constant pressure to this area for 3 hours a day. 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 He has noticed minimal drainage to the wound bed and denies any pain, swelling or erythema to the right foot. 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 In the past 2 months he was seen at Podiatry in [place], [place] and [facility] for this issue. He states [employer] obtained an x-ray recently and reports it showed no significant findings. He was started on a cream for which he can not  remember the name. He states he uses this with daily dressing changes. He was not satisfied with his care at either location and has since decided to follow-up with [place]. Overall he feels well 4/14; Patient presents for his 1 week follow-up. He reports no issues in the past week. He has been using his surgical shoe and has been aggressive with offloading. He is pleased with the progress of his wound healing. He denies any drainage, increased warmth or erythema to the foot. 4/21; Patient presents for 1 week follow-up. He has been using silver alginate every other day along with a surgical shoe. He denies drainage, increased warmth or erythema to the foot. 4/28; patient presents for 1 week follow-up. He has been using silver alginate every other day along with his surgical shoe. He has try to aggressively offload the area in the past week. At this time he would like to continue with current therapy as he is pleased with the progress thus far. He would like to only do a contact cast if this were to worsen. He states that he has 5 more days off before he has to go to work. And will be trying to offload it aggressively in the next week as well. He denies drainage, increased warmth or erythema to the foot. 5/5; patient presents for 1 week follow-up. He started back at work and has been trying to offload the best he can. He continues to use silver alginate with his surgical shoe. He has no new issues or complaints today. He denies signs of infection. 5/12; patient presents for 1 week follow-up. He has been using silver alginate every other day. He tries to offload this area the best he can despite work and having to be on his feet. He has no  issues or complaints today. He denies any signs of infection. 5/26; patient presents for 2-week follow-up. He has been using silver alginate to the wound. He denies a signs of infection. He has no issues or complaints today. He had ABIs with TBI's with an abnormal TBI on the right where the wound is. He has an appointment with vein and vascular on 6/6. 6/2; patient presents for 1 week follow-up. He has been using silver alginate to the wound. He has no issues today. He has an appointment with vein and vascular on 6/6. We discussed potentially doing the cast and he would like to go ahead and try this. 6/6; patient presents for 1 week follow-up. He has been using silver alginate to the wound. He had an appointment with vein and vascular today to discuss ABI results. He states he would like to defer the arteriogram recommended for now and try a total contact cast to see if he has any improvement with wound healing. He denies signs of infection. He reports no pain Electronic Signature(s) Signed: 09/09/2020 12:05:54 PM By: Geralyn Corwin DO Entered By: Geralyn Corwin on 09/09/2020 11:50:07 -------------------------------------------------------------------------------- Physical Exam Details Patient Name: Date of Service: [name]. 09/09/2020 11:30 A M Medical Record Number: [id number] Patient Account Number: [id number] Date of Birth/Sex: Treating RN: Aug 25, 1950 (70 y.o. M) Primary Care Provider: Nita Sells Other Clinician: Referring Provider: Treating Provider/Extender: Tomasita Crumble in Treatment: 8 Constitutional respirations regular, non-labored and within target range for patient.. Cardiovascular 2+ dorsalis pedis/posterior tibialis pulses. Psychiatric pleasant and cooperative. Notes Right foot: Metatarsal first head with small open wound and circumferential callus. This was removed and granulation tissue was present at the wound bed. No signs  of  infection. Electronic Signature(s) Signed: 09/09/2020 12:05:54 PM By: Geralyn Corwin DO Entered By: Geralyn Corwin on 09/09/2020 11:51:37 -------------------------------------------------------------------------------- Physician Orders Details Patient Name: Date of Service: [name]. 09/09/2020 11:30 A M Medical Record Number: [id number] Patient Account Number: [id number] Date of Birth/Sex: Treating RN: 1950-12-08 (70 y.o. Elizebeth Koller Primary Care Provider: Nita Sells Other Clinician: Referring Provider: Treating Provider/Extender: Tomasita Crumble in Treatment: 8 Verbal / Phone Orders: No Diagnosis Coding ICD-10 Coding Code Description E11.621 Type 2 diabetes mellitus with foot ulcer E11.40 Type 2 diabetes mellitus with diabetic neuropathy, unspecified I25.10 Atherosclerotic heart disease of native coronary artery without angina pectoris I10 Essential (primary) hypertension Follow-up Appointments ppointment in: - Wed or Thurs with Dr. Mikey Bussing - ***Extra time 60 minutes for cast change*** Return A Bathing/ Shower/ Hygiene May shower and wash wound with soap and water. - with dressing changes only Off-Loading Total Contact Cast to Right Lower Extremity - right foot Wound Treatment Wound #4 - Metatarsal head first Wound Laterality: Right Cleanser: Soap and Water 1 x Per Week Discharge Instructions: May shower and wash wound with dial antibacterial soap and water prior to dressing change. Peri-Wound Care: Skin Prep 1 x Per Week Discharge Instructions: Use skin prep as directed Prim Dressing: KerraCel Ag Gelling Fiber Dressing, 2x2 in (silver alginate) 1 x Per Week ary Discharge Instructions: Apply silver alginate to wound bed as instructed Secondary Dressing: Woven Gauze Sponges 2x2 in 1 x Per Week Discharge Instructions: Apply over primary dressing as directed. Secondary Dressing: Drawtex 4x4 in 1 x Per Week Discharge Instructions: Apply over  primary dressing as directed. Secured With: 81M Medipore H Soft Cloth Surgical T 4 x 2 (in/yd) 1 x Per Week ape Discharge Instructions: Secure dressing with tape as directed. Electronic Signature(s) Signed: 09/09/2020 12:05:54 PM By: Geralyn Corwin DO Entered By: Geralyn Corwin on 09/09/2020 11:51:59 -------------------------------------------------------------------------------- Problem List Details Patient Name: Date of Service: [name]. 09/09/2020 11:30 A M Medical Record Number: [id number] Patient Account Number: [id number] Date of Birth/Sex: Treating RN: 1950/05/09 (70 y.o. Elizebeth Koller Primary Care Provider: Nita Sells Other Clinician: Referring Provider: Treating Provider/Extender: Tomasita Crumble in Treatment: 8 Active Problems ICD-10 Encounter Code Description Active Date MDM Diagnosis E11.621 Type 2 diabetes mellitus with foot ulcer 07/11/2020 No Yes E11.40 Type 2 diabetes mellitus with diabetic neuropathy, unspecified 07/11/2020 No Yes I25.10 Atherosclerotic heart disease of native coronary artery without angina pectoris 07/11/2020 No Yes I10 Essential (primary) hypertension 07/11/2020 No Yes Inactive Problems Resolved Problems Electronic Signature(s) Signed: 09/09/2020 12:05:54 PM By: Geralyn Corwin DO Entered By: Geralyn Corwin on 09/09/2020 11:49:07 -------------------------------------------------------------------------------- Progress Note Details Patient Name: Date of Service: [name]. 09/09/2020 11:30 A M Medical Record Number: [id number] Patient Account Number: [id number] Date of Birth/Sex: Treating RN: 1951/03/07 (70 y.o. M) Primary Care Provider: Nita Sells Other Clinician: Referring Provider: Treating Provider/Extender: Tomasita Crumble in Treatment: 8 Subjective Chief Complaint Information obtained from Patient the patient arrives for evaluation of a wound on his first right metatarsal head,  plantar aspect History of Present Illness (HPI) 07/12/19 [name] is a 70 year old male with a past medical history of insulin-dependent type 2 diabetes, essential hypertension and coronary artery disease status post CABG in 2009. He has been seen in our clinic on three separate occasions for diabetic foot wounds. Last seen in 2018 for a left fifth metatarsal head diabetic foot ulcer. This was treated with silver alginate and offloading.  Other previous visits were in 2009 and 2015. Patient states that 2 months ago he noticed a blister to the bottom of his foot. He reports using a foot pedal and applying constant pressure to this area for 3 hours a day. 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 He has noticed minimal drainage to the wound bed and denies any pain, swelling or erythema to the right foot. 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 In the past 2 months he was seen at Podiatry in [place], [place] and [facility] for this issue. He states [employer] obtained an x-ray recently and reports it showed no significant findings. He was started on a cream for which he can not remember the name. He states he uses this with daily dressing changes. He was not satisfied with his care at either location and has since decided to follow-up with [place]. Overall he feels well 4/14; Patient presents for his 1 week follow-up. He reports no issues in the past week. He has been using his surgical shoe and has been aggressive with offloading. He is pleased with the progress of his wound healing. He denies any drainage, increased warmth or erythema to the foot. 4/21; Patient presents for 1 week follow-up. He has been using silver alginate every other day along with a surgical shoe. He denies drainage, increased warmth or erythema to the  foot. 4/28; patient presents for 1 week follow-up. He has been using silver alginate every other day along with his surgical shoe. He has try to aggressively offload the area in the past week. At this time he would like to continue with current therapy as he is pleased with the progress thus far. He would like to only do a contact cast if this were to worsen. He states that he has 5 more days off before he has to go to work. And will be trying to offload it aggressively in the next week as well. He denies drainage, increased warmth or erythema to the foot. 5/5; patient presents for 1 week follow-up. He started back at work and has been trying to offload the best he can. He continues to use silver alginate with his surgical shoe. He has no new issues or complaints today. He denies signs of infection. 5/12; patient presents for 1 week follow-up. He has been using silver alginate every other day. He tries to offload this area the best he can despite work and having to be on his feet. He has no issues or complaints today. He denies any signs of infection. 5/26; patient presents for 2-week follow-up. He has been using silver alginate to the wound. He denies a signs of infection. He has no issues or complaints today. He had ABIs with TBI's with an abnormal TBI on the right where the wound is. He has an appointment with vein and vascular on 6/6. 6/2; patient presents for 1 week follow-up. He has been using silver alginate to the wound. He has no issues today. He has an appointment with vein and vascular on 6/6. We discussed potentially doing the cast and he would like to go ahead and try this. 6/6; patient presents for 1 week follow-up. He has been using silver alginate to the wound. He had an appointment with vein and vascular today to discuss ABI results. He  states he would like to defer the arteriogram recommended for now and try a total contact cast to see if he has any improvement with wound healing. He  denies signs of infection. He reports no pain Patient History Information obtained from Patient. Family History Cancer - Mother, Diabetes - Father,Siblings, Heart Disease - Father,Siblings, Hypertension - Mother,Father,Siblings, No family history of Hereditary Spherocytosis, Kidney Disease, Lung Disease, Seizures, Stroke, Thyroid Problems, Tuberculosis. Social History Never smoker, Marital Status - Married, Alcohol Use - Never, Drug Use - No History, Caffeine Use - Daily - coffee. Medical History Eyes Patient has history of Cataracts - removed Denies history of Glaucoma, Optic Neuritis Ear/Nose/Mouth/Throat Denies history of Chronic sinus problems/congestion, Middle ear problems Hematologic/Lymphatic Denies history of Anemia, Hemophilia, Human Immunodeficiency Virus, Lymphedema, Sickle Cell Disease Respiratory Patient has history of Sleep Apnea Denies history of Aspiration, Asthma, Chronic Obstructive Pulmonary Disease (COPD), Pneumothorax, Tuberculosis Cardiovascular Patient has history of Coronary Artery Disease - CABG x5, Hypertension, Peripheral Arterial Disease Denies history of Angina, Arrhythmia, Congestive Heart Failure, Deep Vein Thrombosis, Hypotension, Myocardial Infarction, Peripheral Venous Disease, Phlebitis, Vasculitis Gastrointestinal Denies history of Cirrhosis , Colitis, Crohnoos, Hepatitis A, Hepatitis B, Hepatitis C Endocrine Patient has history of Type II Diabetes Denies history of Type I Diabetes Genitourinary Denies history of End Stage Renal Disease Immunological Denies history of Lupus Erythematosus, Raynaudoos, Scleroderma Integumentary (Skin) Denies history of History of Burn Musculoskeletal Denies history of Gout, Rheumatoid Arthritis, Osteoarthritis, Osteomyelitis Neurologic Patient has history of Neuropathy Denies history of Dementia, Quadriplegia, Paraplegia, Seizure Disorder Oncologic Denies history of Received Chemotherapy, Received  Radiation Psychiatric Denies history of Anorexia/bulimia, Confinement Anxiety Hospitalization/Surgery History - coronoary artery bypass. - cardiac cath. - back surgery. - appendectomy. - tonsillectomy. Medical A Surgical History Notes nd Cardiovascular CVA, hyperlipidemia, carotid stenosis Gastrointestinal colon polyps Genitourinary Renal Insufficiency Integumentary (Skin) (L) foot ulcer - Objective Constitutional respirations regular, non-labored and within target range for patient.. Vitals Time Taken: 10:50 AM, Height: 70 in, Weight: 225 lbs, BMI: 32.3, Temperature: 98.0 F, Pulse: 64 bpm, Respiratory Rate: 16 breaths/min, Blood Pressure: 103/67 mmHg, Capillary Blood Glucose: 129 mg/dl. Cardiovascular 2+ dorsalis pedis/posterior tibialis pulses. Psychiatric pleasant and cooperative. General Notes: Right foot: Metatarsal first head with small open wound and circumferential callus. This was removed and granulation tissue was present at the wound bed. No signs of infection. Integumentary (Hair, Skin) Wound #4 status is Open. Original cause of wound was Blister. The date acquired was: 05/07/2020. The wound has been in treatment 8 weeks. The wound is located on the Right Metatarsal head first. The wound measures 0.5cm length x 0.4cm width x 0.3cm depth; 0.157cm^2 area and 0.047cm^3 volume. There is Fat Layer (Subcutaneous Tissue) exposed. There is no tunneling noted, however, there is undermining starting at 9:00 and ending at 6:00 with a maximum distance of 0.4cm. There is a medium amount of serosanguineous drainage noted. The wound margin is well defined and not attached to the wound base. There is large (67-100%) pink granulation within the wound bed. There is no necrotic tissue within the wound bed. Assessment Active Problems ICD-10 Type 2 diabetes mellitus with foot ulcer Type 2 diabetes mellitus with diabetic neuropathy, unspecified Atherosclerotic heart disease of native  coronary artery without angina pectoris Essential (primary) hypertension Patient was evaluated by Dr. Arbie Cookey with vein and vascular this morning. I referred for abnormal ABIs. On hand-held Doppler he had monophasic flow at the level of the posterior tibial and anterior tibial and peroneal on the right. He recommended an arteriogram  for further evaluation. The patient declined. I readdressed this as I also recommended he proceed with the study however he does not want to do the arteriogram at this time. He does want to try the total contact cast. We discussed the risks of doing this and he agrees to the risks. He does have good capillary refill of his toes and his foot is warm. I am not too concerned about issues with putting on a cast but I did say to call with any concerns including increased pain. He will follow-up in a couple days for obligatory cast change and we will reassess the situation. I carefully removed some necrotic tissue from the wound bed. No signs of infection today. Procedures Wound #4 Pre-procedure diagnosis of Wound #4 is a Diabetic Wound/Ulcer of the Lower Extremity located on the Right Metatarsal head first .Severity of Tissue Pre Debridement is: Fat layer exposed. There was a Excisional Skin/Subcutaneous Tissue Debridement with a total area of 0.2 sq cm performed by Geralyn Corwin, DO. With the following instrument(s): Curette to remove Non-Viable tissue/material. Material removed includes Callus and Subcutaneous Tissue and. No specimens were taken. A time out was conducted at 11:01, prior to the start of the procedure. A Minimum amount of bleeding was controlled with Pressure. The procedure was tolerated well with a pain level of 0 throughout and a pain level of 0 following the procedure. Post Debridement Measurements: 0.5cm length x 0.4cm width x 0.3cm depth; 0.047cm^3 volume. Character of Wound/Ulcer Post Debridement is stable. Severity of Tissue Post Debridement is: Fat  layer exposed. Post procedure Diagnosis Wound #4: Same as Pre-Procedure Pre-procedure diagnosis of Wound #4 is a Diabetic Wound/Ulcer of the Lower Extremity located on the Right Metatarsal head first . There was a T Contact otal Cast Procedure by Geralyn Corwin, DO. Post procedure Diagnosis Wound #4: Same as Pre-Procedure Plan Follow-up Appointments: Return Appointment in: - Wed or Thurs with Dr. Mikey Bussing - ***Extra time 60 minutes for cast change*** Bathing/ Shower/ Hygiene: May shower and wash wound with soap and water. - with dressing changes only Off-Loading: T Contact Cast to Right Lower Extremity - right foot otal WOUND #4: - Metatarsal head first Wound Laterality: Right Cleanser: Soap and Water 1 x Per Week/ Discharge Instructions: May shower and wash wound with dial antibacterial soap and water prior to dressing change. Peri-Wound Care: Skin Prep 1 x Per Week/ Discharge Instructions: Use skin prep as directed Prim Dressing: KerraCel Ag Gelling Fiber Dressing, 2x2 in (silver alginate) 1 x Per Week/ ary Discharge Instructions: Apply silver alginate to wound bed as instructed Secondary Dressing: Woven Gauze Sponges 2x2 in 1 x Per Week/ Discharge Instructions: Apply over primary dressing as directed. Secondary Dressing: Drawtex 4x4 in 1 x Per Week/ Discharge Instructions: Apply over primary dressing as directed. Secured With: 75M Medipore H Soft Cloth Surgical T 4 x 2 (in/yd) 1 x Per Week/ ape Discharge Instructions: Secure dressing with tape as directed. 1. In office sharp debridement 2. T contact cast otal 3. Follow-up in a few days Electronic Signature(s) Signed: 09/09/2020 12:05:54 PM By: Geralyn Corwin DO Entered By: Geralyn Corwin on 09/09/2020 12:04:47 -------------------------------------------------------------------------------- HxROS Details Patient Name: Date of Service: [name]. 09/09/2020 11:30 A M Medical Record Number: [id number] Patient Account  Number: [id number] Date of Birth/Sex: Treating RN: 1950-09-16 (70 y.o. M) Primary Care Provider: Nita Sells Other Clinician: Referring Provider: Treating Provider/Extender: Tomasita Crumble in Treatment: 8 Information Obtained From Patient Eyes Medical History: Positive for:  Cataracts - removed Negative for: Glaucoma; Optic Neuritis Ear/Nose/Mouth/Throat Medical History: Negative for: Chronic sinus problems/congestion; Middle ear problems Hematologic/Lymphatic Medical History: Negative for: Anemia; Hemophilia; Human Immunodeficiency Virus; Lymphedema; Sickle Cell Disease Respiratory Medical History: Positive for: Sleep Apnea Negative for: Aspiration; Asthma; Chronic Obstructive Pulmonary Disease (COPD); Pneumothorax; Tuberculosis Cardiovascular Medical History: Positive for: Coronary Artery Disease - CABG x5; Hypertension; Peripheral Arterial Disease Negative for: Angina; Arrhythmia; Congestive Heart Failure; Deep Vein Thrombosis; Hypotension; Myocardial Infarction; Peripheral Venous Disease; Phlebitis; Vasculitis Past Medical History Notes: CVA, hyperlipidemia, carotid stenosis Gastrointestinal Medical History: Negative for: Cirrhosis ; Colitis; Crohns; Hepatitis A; Hepatitis B; Hepatitis C Past Medical History Notes: colon polyps Endocrine Medical History: Positive for: Type II Diabetes Negative for: Type I Diabetes Time with diabetes: over 10 years Treated with: Insulin, Oral agents Blood sugar tested every day: Yes Tested : 2-3 times a day Blood sugar testing results: Breakfast: 160 Genitourinary Medical History: Negative for: End Stage Renal Disease Past Medical History Notes: Renal Insufficiency Immunological Medical History: Negative for: Lupus Erythematosus; Raynauds; Scleroderma Integumentary (Skin) Medical History: Negative for: History of Burn Past Medical History Notes: (L) foot ulcer - Musculoskeletal Medical History: Negative  for: Gout; Rheumatoid Arthritis; Osteoarthritis; Osteomyelitis Neurologic Medical History: Positive for: Neuropathy Negative for: Dementia; Quadriplegia; Paraplegia; Seizure Disorder Oncologic Medical History: Negative for: Received Chemotherapy; Received Radiation Psychiatric Medical History: Negative for: Anorexia/bulimia; Confinement Anxiety HBO Extended History Items Eyes: Cataracts Immunizations Pneumococcal Vaccine: Received Pneumococcal Vaccination: No Implantable Devices No devices added Hospitalization / Surgery History Type of Hospitalization/Surgery coronoary artery bypass cardiac cath back surgery appendectomy tonsillectomy Family and Social History Cancer: Yes - Mother; Diabetes: Yes - Father,Siblings; Heart Disease: Yes - Father,Siblings; Hereditary Spherocytosis: No; Hypertension: Yes - Mother,Father,Siblings; Kidney Disease: No; Lung Disease: No; Seizures: No; Stroke: No; Thyroid Problems: No; Tuberculosis: No; Never smoker; Marital Status - Married; Alcohol Use: Never; Drug Use: No History; Caffeine Use: Daily - coffee; Financial Concerns: No; Food, Clothing or Shelter Needs: No; Support System Lacking: No; Transportation Concerns: No Electronic Signature(s) Signed: 09/09/2020 12:05:54 PM By: Geralyn CorwinHoffman, Shaunie Boehm DO Entered By: Geralyn CorwinHoffman, Hardy Harcum on 09/09/2020 11:50:22 -------------------------------------------------------------------------------- Total Contact Cast Details Patient Name: Date of Service: Blane OharaBISTYGA, RO BERT W. 09/09/2020 11:30 A M Medical Record Number: [id number] Patient Account Number: [id number] Date of Birth/Sex: Treating RN: 01/25/1951 (70 y.o. Elizebeth KollerM) Lynch, Shatara Primary Care Provider: Nita SellsHall, John Other Clinician: Referring Provider: Treating Provider/Extender: Tomasita CrumbleHoffman, Alec Jaros Hall, John Weeks in Treatment: 8 T Contact Cast Applied for Wound Assessment: otal Wound #4 Right Metatarsal head first Performed By: Physician Geralyn CorwinHoffman, Wilbern Pennypacker,  DO Post Procedure Diagnosis Same as Pre-procedure Electronic Signature(s) Signed: 09/09/2020 12:05:54 PM By: Geralyn CorwinHoffman, Paizlee Kinder DO Signed: 09/11/2020 5:57:05 PM By: Zandra AbtsLynch, Shatara RN, BSN Entered By: Zandra AbtsLynch, Shatara on 09/09/2020 11:03:15 -------------------------------------------------------------------------------- SuperBill Details Patient Name: Date of Service: Blane OharaBISTYGA, RO BERT W. 09/09/2020 Medical Record Number: [id number] Patient Account Number: [id number] Date of Birth/Sex: Treating RN: 08/15/1950 [age] y.o. M) Primary Care Provider: Nita SellsHall, John Other Clinician: Referring Provider: Treating Provider/Extender: Tomasita CrumbleHoffman, Adriauna Campton Hall, John Weeks in Treatment: 8 Diagnosis Coding ICD-10 Codes Code Description E11.621 Type 2 diabetes mellitus with foot ulcer E11.40 Type 2 diabetes mellitus with diabetic neuropathy, unspecified I25.10 Atherosclerotic heart disease of native coronary artery without angina pectoris I10 Essential (primary) hypertension Facility Procedures CPT4 Code: [id number] Description: 11042 - DEB SUBQ TISSUE 20 SQ CM/< ICD-10 Diagnosis Description E11.621 Type 2 diabetes mellitus with foot ulcer E11.40 Type 2 diabetes mellitus with diabetic neuropathy, unspecified Modifier: Quantity: 1 Physician Procedures : CPT4 Code Description  Modifier [id number] 11042 - WC PHYS SUBQ TISS 20 SQ CM ICD-10 Diagnosis Description E11.621 Type 2 diabetes mellitus with foot ulcer E11.40 Type 2 diabetes mellitus with diabetic neuropathy, unspecified Quantity: 1 Electronic Signature(s) Signed: 09/09/2020 12:05:54 PM By: Geralyn Corwin DO Entered By: Geralyn Corwin on 09/09/2020 12:04:58

## 2020-09-11 NOTE — Progress Notes (Signed)
[name], [name] ([id number]) Visit Report for 09/09/2020 Arrival Information Details Patient Name: Date of Service: [name], [name]. 09/09/2020 11:30 A M Medical Record Number: [id number] Patient Account Number: [id number] Date of Birth/Sex: Treating RN: [birth year] (70 y.o. Janyth Contes Primary Care Kainen Struckman: Allyn Kenner Other Clinician: Referring Daenerys Buttram: Treating Edson Deridder/Extender: Dierdre Harness in Treatment: 8 Visit Information History Since Last Visit Added or deleted any medications: No Patient Arrived: Ambulatory Any new allergies or adverse reactions: No Arrival Time: 10:48 Had a fall or experienced change in No Accompanied By: wife activities of daily living that may affect Transfer Assistance: None risk of falls: Patient Identification Verified: Yes Signs or symptoms of abuse/neglect since last visito No Secondary Verification Process Completed: Yes Hospitalized since last visit: No Patient Requires Transmission-Based Precautions: No Implantable device outside of the clinic excluding No Patient Has Alerts: Yes cellular tissue based products placed in the center Patient Alerts: Patient on Blood Thinner since last visit: R ABI non compressible Has Dressing in Place as Prescribed: Yes Pain Present Now: No Electronic Signature(s) Signed: 09/11/2020 5:57:05 PM By: Levan Hurst RN, BSN Entered By: Levan Hurst on 09/09/2020 10:48:39 -------------------------------------------------------------------------------- Encounter Discharge Information Details Patient Name: Date of Service: [name]. 09/09/2020 11:30 A M Medical Record Number: [id number] Patient Account Number: [id number] Date of Birth/Sex: Treating RN: July 13, 1950 (69 y.o. Marcheta Grammes Primary Care Daphene Chisholm: Allyn Kenner Other Clinician: Referring Alyson Ki: Treating Kingston Guiles/Extender: Dierdre Harness in Treatment: 8 Encounter Discharge Information Items  Post Procedure Vitals Discharge Condition: Stable Temperature (F): 98 Ambulatory Status: Ambulatory Pulse (bpm): 64 Discharge Destination: Home Respiratory Rate (breaths/min): 16 Transportation: Private Auto Blood Pressure (mmHg): 103/67 Accompanied By: wife Schedule Follow-up Appointment: Yes Clinical Summary of Care: Provided on 09/09/2020 Form Type Recipient Paper Patient Patient Electronic Signature(s) Signed: 09/09/2020 11:23:50 AM By: Lorrin Jackson Entered By: Lorrin Jackson on 09/09/2020 11:23:50 -------------------------------------------------------------------------------- Lower Extremity Assessment Details Patient Name: Date of Service: [name]. 09/09/2020 11:30 A M Medical Record Number: [id number] Patient Account Number: [id number] Date of Birth/Sex: Treating RN: 19-Nov-1950 (70 y.o. Janyth Contes Primary Care Pranav Lince: Allyn Kenner Other Clinician: Referring Beila Purdie: Treating Kareema Keitt/Extender: Dierdre Harness in Treatment: 8 Edema Assessment Assessed: [Left: No] [Right: No] Edema: [Left: N] [Right: o] Calf Left: Right: Point of Measurement: 32 cm From Medial Instep 43 cm Ankle Left: Right: Point of Measurement: 11 cm From Medial Instep 27.5 cm Vascular Assessment Pulses: Dorsalis Pedis Palpable: [Right:Yes] Electronic Signature(s) Signed: 09/11/2020 5:57:05 PM By: Levan Hurst RN, BSN Entered By: Levan Hurst on 09/09/2020 10:53:12 -------------------------------------------------------------------------------- Multi Wound Chart Details Patient Name: Date of Service: [name]. 09/09/2020 11:30 A M Medical Record Number: [id number] Patient Account Number: [id number] Date of Birth/Sex: Treating RN: 01/29/1951 (69 y.o. M) Primary Care Jazmon Kos: Allyn Kenner Other Clinician: Referring Torben Soloway: Treating Lambros Cerro/Extender: Dierdre Harness in Treatment: 8 Vital Signs Height(in): 70 Capillary  Blood Glucose(mg/dl): 129 Weight(lbs): 225 Pulse(bpm): 13 Body Mass Index(BMI): 32 Blood Pressure(mmHg): 103/67 Temperature(F): 98.0 Respiratory Rate(breaths/min): 16 Photos: [4:No Photos Right Metatarsal head first] [N/A:N/A N/A] Wound Location: [4:Blister] [N/A:N/A] Wounding Event: [4:Diabetic Wound/Ulcer of the Lower] [N/A:N/A] Primary Etiology: [4:Extremity Cataracts, Sleep Apnea, Coronary] [N/A:N/A] Comorbid History: [4:Artery Disease, Hypertension, Peripheral Arterial Disease, Type II Diabetes, Neuropathy 05/07/2020] [N/A:N/A] Date Acquired: [4:8] [N/A:N/A] Weeks of Treatment: [4:Open] [N/A:N/A] Wound Status: [4:0.5x0.4x0.3] [N/A:N/A] Measurements L x W x D (cm) [4:0.157] [N/A:N/A] A (cm) : rea [4:0.047] [N/A:N/A] Volume (cm) : [4:-149.20%] [  N/A:N/A] % Reduction in A rea: [4:-147.40%] [N/A:N/A] % Reduction in Volume: [4:9] Starting Position 1 (o'clock): [4:6] Ending Position 1 (o'clock): [4:0.4] Maximum Distance 1 (cm): [4:Yes] [N/A:N/A] Undermining: [4:Grade 2] [N/A:N/A] Classification: [4:Medium] [N/A:N/A] Exudate A mount: [4:Serosanguineous] [N/A:N/A] Exudate Type: [4:red, brown] [N/A:N/A] Exudate Color: [4:Well defined, not attached] [N/A:N/A] Wound Margin: [4:Large (67-100%)] [N/A:N/A] Granulation A mount: [4:Pink] [N/A:N/A] Granulation Quality: [4:None Present (0%)] [N/A:N/A] Necrotic A mount: [4:Fat Layer (Subcutaneous Tissue): Yes N/A] Exposed Structures: [4:Fascia: No Tendon: No Muscle: No Joint: No Bone: No Medium (34-66%)] [N/A:N/A] Epithelialization: [4:Debridement - Excisional] [N/A:N/A] Debridement: Pre-procedure Verification/Time Out 11:01 [N/A:N/A] Taken: [4:Callus, Subcutaneous] [N/A:N/A] Tissue Debrided: [4:Skin/Subcutaneous Tissue] [N/A:N/A] Level: [4:0.2] [N/A:N/A] Debridement A (sq cm): [4:rea Curette] [N/A:N/A] Instrument: [4:Minimum] [N/A:N/A] Bleeding: [4:Pressure] [N/A:N/A] Hemostasis A chieved: [4:0] [N/A:N/A] Procedural Pain: [4:0]  [N/A:N/A] Post Procedural Pain: [4:Procedure was tolerated well] [N/A:N/A] Debridement Treatment Response: [4:0.5x0.4x0.3] [N/A:N/A] Post Debridement Measurements L x W x D (cm) [4:0.047] [N/A:N/A] Post Debridement Volume: (cm) [4:Debridement] [N/A:N/A] Procedures Performed: [4:T Contact Cast otal] Treatment Notes Wound #4 (Metatarsal head first) Wound Laterality: Right Cleanser Soap and Water Discharge Instruction: May shower and wash wound with dial antibacterial soap and water prior to dressing change. Peri-Wound Care Skin Prep Discharge Instruction: Use skin prep as directed Topical Primary Dressing KerraCel Ag Gelling Fiber Dressing, 2x2 in (silver alginate) Discharge Instruction: Apply silver alginate to wound bed as instructed Secondary Dressing Drawtex 4x4 in Discharge Instruction: Apply over primary dressing as directed. Woven Gauze Sponges 2x2 in Discharge Instruction: Apply over primary dressing as directed. Secured With 37M Medipore H Soft Cloth Surgical T 4 x 2 (in/yd) ape Discharge Instruction: Secure dressing with tape as directed. Compression Wrap Compression Stockings Add-Ons Electronic Signature(s) Signed: 09/09/2020 12:05:54 PM By: Kalman Shan DO Entered By: Kalman Shan on 09/09/2020 11:49:14 -------------------------------------------------------------------------------- Multi-Disciplinary Care Plan Details Patient Name: Date of Service: [name]. 09/09/2020 11:30 A M Medical Record Number: [id number] Patient Account Number: [id number] Date of Birth/Sex: Treating RN: Sep 03, 1950 (70 y.o. Janyth Contes Primary Care Somalia Segler: Allyn Kenner Other Clinician: Referring Shyloh Derosa: Treating Sandrina Heaton/Extender: Dierdre Harness in Treatment: 8 Multidisciplinary Care Plan reviewed with physician Active Inactive Nutrition Nursing Diagnoses: Impaired glucose control: actual or potential Potential for alteratiion in  Nutrition/Potential for imbalanced nutrition Goals: Patient/caregiver agrees to and verbalizes understanding of need to use nutritional supplements and/or vitamins as prescribed Date Initiated: 07/11/2020 Date Inactivated: 08/01/2020 Target Resolution Date: 08/09/2020 Goal Status: Met Patient/caregiver will maintain therapeutic glucose control Date Initiated: 07/11/2020 Target Resolution Date: 10/03/2020 Goal Status: Active Interventions: Assess HgA1c results as ordered upon admission and as needed Assess patient nutrition upon admission and as needed per policy Provide education on elevated blood sugars and impact on wound healing Provide education on nutrition Treatment Activities: Education provided on Nutrition : 08/29/2020 Notes: Electronic Signature(s) Signed: 09/11/2020 5:57:05 PM By: Levan Hurst RN, BSN Entered By: Levan Hurst on 09/09/2020 10:59:20 -------------------------------------------------------------------------------- Pain Assessment Details Patient Name: Date of Service: [name]. 09/09/2020 11:30 A M Medical Record Number: [id number] Patient Account Number: [id number] Date of Birth/Sex: Treating RN: 08/16/1950 (70 y.o. Janyth Contes Primary Care Amee Boothe: Allyn Kenner Other Clinician: Referring Olisa Quesnel: Treating Eliese Kerwood/Extender: Dierdre Harness in Treatment: 8 Active Problems Location of Pain Severity and Description of Pain Patient Has Paino No Site Locations Pain Management and Medication Current Pain Management: Electronic Signature(s) Signed: 09/11/2020 5:57:05 PM By: Levan Hurst RN, BSN Entered By: Levan Hurst on 09/09/2020 10:52:59 -------------------------------------------------------------------------------- Patient/Caregiver Education Details  Patient Name: Date of Service: RENEE, BEALE 6/6/2022andnbsp11:30 A M Medical Record Number: [id number] Patient Account Number: [id number] Date of  Birth/Gender: Treating RN: April 04, 1951 (70 y.o. Janyth Contes Primary Care Physician: Allyn Kenner Other Clinician: Referring Physician: Treating Physician/Extender: Dierdre Harness in Treatment: 8 Education Assessment Education Provided To: Patient Education Topics Provided Offloading: Methods: Explain/Verbal Responses: State content correctly Wound/Skin Impairment: Methods: Explain/Verbal Responses: State content correctly Electronic Signature(s) Signed: 09/11/2020 5:57:05 PM By: Levan Hurst RN, BSN Entered By: Levan Hurst on 09/09/2020 11:00:12 -------------------------------------------------------------------------------- Wound Assessment Details Patient Name: Date of Service: [name]. 09/09/2020 11:30 A M Medical Record Number: [id number] Patient Account Number: [id number] Date of Birth/Sex: Treating RN: 11/11/1950 (70 y.o. Janyth Contes Primary Care Caili Escalera: Allyn Kenner Other Clinician: Referring Tyasia Packard: Treating Qunicy Higinbotham/Extender: Dierdre Harness in Treatment: 8 Wound Status Wound Number: 4 Primary Diabetic Wound/Ulcer of the Lower Extremity Etiology: Wound Location: Right Metatarsal head first Wound Open Wounding Event: Blister Status: Date Acquired: 05/07/2020 Comorbid Cataracts, Sleep Apnea, Coronary Artery Disease, Hypertension, Weeks Of Treatment: 8 History: Peripheral Arterial Disease, Type II Diabetes, Neuropathy Clustered Wound: No Photos Wound Measurements Length: (cm) 0.5 Width: (cm) 0.4 Depth: (cm) 0.3 Area: (cm) 0.157 Volume: (cm) 0.047 % Reduction in Area: -149.2% % Reduction in Volume: -147.4% Epithelialization: Medium (34-66%) Tunneling: No Undermining: Yes Starting Position (o'clock): 9 Ending Position (o'clock): 6 Maximum Distance: (cm) 0.4 Wound Description Classification: Grade 2 Wound Margin: Well defined, not attached Exudate Amount: Medium Exudate Type:  Serosanguineous Exudate Color: red, brown Foul Odor After Cleansing: No Slough/Fibrino No Wound Bed Granulation Amount: Large (67-100%) Exposed Structure Granulation Quality: Pink Fascia Exposed: No Necrotic Amount: None Present (0%) Fat Layer (Subcutaneous Tissue) Exposed: Yes Tendon Exposed: No Muscle Exposed: No Joint Exposed: No Bone Exposed: No Treatment Notes Wound #4 (Metatarsal head first) Wound Laterality: Right Cleanser Soap and Water Discharge Instruction: May shower and wash wound with dial antibacterial soap and water prior to dressing change. Peri-Wound Care Skin Prep Discharge Instruction: Use skin prep as directed Topical Primary Dressing KerraCel Ag Gelling Fiber Dressing, 2x2 in (silver alginate) Discharge Instruction: Apply silver alginate to wound bed as instructed Secondary Dressing Drawtex 4x4 in Discharge Instruction: Apply over primary dressing as directed. Woven Gauze Sponges 2x2 in Discharge Instruction: Apply over primary dressing as directed. Secured With 41M Medipore H Soft Cloth Surgical T 4 x 2 (in/yd) ape Discharge Instruction: Secure dressing with tape as directed. Compression Wrap Compression Stockings Add-Ons Electronic Signature(s) Signed: 09/10/2020 2:13:29 PM By: Sandre Kitty Signed: 09/11/2020 5:57:05 PM By: Levan Hurst RN, BSN Entered By: Sandre Kitty on 09/09/2020 68:03:21 -------------------------------------------------------------------------------- Vitals Details Patient Name: Date of Service: [name]. 09/09/2020 11:30 A M Medical Record Number: [id number] Patient Account Number: [id number] Date of Birth/Sex: Treating RN: 12-Sep-1950 (70 y.o. Janyth Contes Primary Care Taylon Coole: Allyn Kenner Other Clinician: Referring Haddon Fyfe: Treating Charae Depaolis/Extender: Dierdre Harness in Treatment: 8 Vital Signs Time Taken: 10:50 Temperature (F): 98.0 Height (in): 70 Pulse (bpm): 64 Weight  (lbs): 225 Respiratory Rate (breaths/min): 16 Body Mass Index (BMI): 32.3 Blood Pressure (mmHg): 103/67 Capillary Blood Glucose (mg/dl): 129 Reference Range: 80 - 120 mg / dl Electronic Signature(s) Signed: 09/11/2020 5:57:05 PM By: Levan Hurst RN, BSN Entered By: Levan Hurst on 09/09/2020 10:50:18

## 2020-09-12 ENCOUNTER — Other Ambulatory Visit: Payer: Self-pay

## 2020-09-12 ENCOUNTER — Encounter (HOSPITAL_BASED_OUTPATIENT_CLINIC_OR_DEPARTMENT_OTHER): Payer: Medicare HMO | Admitting: Internal Medicine

## 2020-09-12 DIAGNOSIS — E11621 Type 2 diabetes mellitus with foot ulcer: Secondary | ICD-10-CM

## 2020-09-12 DIAGNOSIS — E1140 Type 2 diabetes mellitus with diabetic neuropathy, unspecified: Secondary | ICD-10-CM | POA: Diagnosis not present

## 2020-09-12 DIAGNOSIS — E1151 Type 2 diabetes mellitus with diabetic peripheral angiopathy without gangrene: Secondary | ICD-10-CM | POA: Diagnosis not present

## 2020-09-12 DIAGNOSIS — I10 Essential (primary) hypertension: Secondary | ICD-10-CM | POA: Diagnosis not present

## 2020-09-12 DIAGNOSIS — Z794 Long term (current) use of insulin: Secondary | ICD-10-CM | POA: Diagnosis not present

## 2020-09-12 NOTE — Progress Notes (Signed)
[name], LUPPINO ([id number]) Visit Report for 09/12/2020 Chief Complaint Document Details Patient Name: Date of Service: [name] 09/12/2020 8:45 A M Medical Record Number: [id number] Patient Account Number: [id number] Date of Birth/Sex: Treating RN: Jan 07, 1951 (70 y.o. [name] Primary Care Provider: Nita Sells Other Clinician: Referring Provider: Treating Provider/Extender: Tomasita Crumble in Treatment: 9 Information Obtained from: Patient Chief Complaint the patient arrives for evaluation of a wound on his first right metatarsal head, plantar aspect Electronic Signature(s) Signed: 09/12/2020 1:24:01 PM By: Geralyn Corwin DO Entered By: Geralyn Corwin on 09/12/2020 12:58:21 -------------------------------------------------------------------------------- Debridement Details Patient Name: Date of Service: [name]. 09/12/2020 8:45 A M Medical Record Number: [id number] Patient Account Number: [id number] Date of Birth/Sex: Treating RN: January 12, 1951 (70 y.o. [name] Primary Care Provider: Nita Sells Other Clinician: Referring Provider: Treating Provider/Extender: Tomasita Crumble in Treatment: 9 Debridement Performed for Assessment: Wound #4 Right Metatarsal head first Performed By: Physician Geralyn Corwin, DO Debridement Type: Debridement Severity of Tissue Pre Debridement: Fat layer exposed Level of Consciousness (Pre-procedure): Awake and Alert Pre-procedure Verification/Time Out Yes - 09:28 Taken: Start Time: 09:29 Pain Control: Lidocaine 4% T opical Solution T Area Debrided (L x W): otal 0.6 (cm) x 0.6 (cm) = 0.36 (cm) Tissue and other material debrided: Viable, Non-Viable, Slough, Subcutaneous, Skin: Dermis , Skin: Epidermis, Fibrin/Exudate, Slough Level: Skin/Subcutaneous Tissue Debridement Description: Excisional Instrument: Curette Bleeding: Minimum Hemostasis Achieved: Pressure End Time:  09:33 Procedural Pain: 0 Post Procedural Pain: 0 Response to Treatment: Procedure was tolerated well Level of Consciousness (Post- Awake and Alert procedure): Post Debridement Measurements of Total Wound Length: (cm) 0.4 Width: (cm) 0.4 Depth: (cm) 0.3 Volume: (cm) 0.038 Character of Wound/Ulcer Post Debridement: Improved Severity of Tissue Post Debridement: Fat layer exposed Post Procedure Diagnosis Same as Pre-procedure Electronic Signature(s) Signed: 09/12/2020 1:24:01 PM By: Geralyn Corwin DO Signed: 09/12/2020 6:02:04 PM By: Shawn Stall Entered By: Shawn Stall on 09/12/2020 09:34:02 -------------------------------------------------------------------------------- HPI Details Patient Name: Date of Service: [name]. 09/12/2020 8:45 A M Medical Record Number: [id number] Patient Account Number: [id number] Date of Birth/Sex: Treating RN: 07-31-1950 (70 y.o. [name] Primary Care Provider: Nita Sells Other Clinician: Referring Provider: Treating Provider/Extender: Tomasita Crumble in Treatment: 9 History of Present Illness HPI Description: 07/12/19 [name] is a 70 year old male with a past medical history of insulin-dependent type 2 diabetes, essential hypertension and coronary artery disease status post CABG in 2009. He has been seen in our clinic on three separate occasions for diabetic foot wounds. Last seen in 2018 for a left fifth metatarsal head diabetic foot ulcer. This was treated with silver alginate and offloading. Other previous visits were in 2009 and 2015. Patient states that 2 months ago he noticed a blister to the bottom of his foot. He reports using a foot pedal and applying constant pressure to this area for 3 hours a day. 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 He has noticed minimal drainage to the wound bed and denies any pain, swelling  or erythema to the right foot. 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 In the past 2 months he was seen at Podiatry in [place], [place] and [facility] for this issue. He states Music therapist obtained an x-ray recently and  reports it showed no significant findings. He was started on a cream for which he can not remember the name. He states he uses this with daily dressing changes. He was not satisfied with his care at either location and has since decided to follow-up with [place]. Overall he feels well 4/14; Patient presents for his 1 week follow-up. He reports no issues in the past week. He has been using his surgical shoe and has been aggressive with offloading. He is pleased with the progress of his wound healing. He denies any drainage, increased warmth or erythema to the foot. 4/21; Patient presents for 1 week follow-up. He has been using silver alginate every other day along with a surgical shoe. He denies drainage, increased warmth or erythema to the foot. 4/28; patient presents for 1 week follow-up. He has been using silver alginate every other day along with his surgical shoe. He has try to aggressively offload the area in the past week. At this time he would like to continue with current therapy as he is pleased with the progress thus far. He would like to only do a contact cast if this were to worsen. He states that he has 5 more days off before he has to go to work. And will be trying to offload it aggressively in the next week as well. He denies drainage, increased warmth or erythema to the foot. 5/5; patient presents for 1 week follow-up. He started back at work and has been trying to offload the best he can. He continues to use silver alginate with his surgical shoe. He has no new issues or complaints today. He denies signs of infection. 5/12; patient presents for 1 week follow-up. He has been using  silver alginate every other day. He tries to offload this area the best he can despite work and having to be on his feet. He has no issues or complaints today. He denies any signs of infection. 5/26; patient presents for 2-week follow-up. He has been using silver alginate to the wound. He denies a signs of infection. He has no issues or complaints today. He had ABIs with TBI's with an abnormal TBI on the right where the wound is. He has an appointment with vein and vascular on 6/6. 6/2; patient presents for 1 week follow-up. He has been using silver alginate to the wound. He has no issues today. He has an appointment with vein and vascular on 6/6. We discussed potentially doing the cast and he would like to go ahead and try this. 6/6; patient presents for 1 week follow-up. He has been using silver alginate to the wound. He had an appointment with vein and vascular today to discuss ABI results. He states he would like to defer the arteriogram recommended for now and try a total contact cast to see if he has any improvement with wound healing. He denies signs of infection. He reports no pain 6/9; patient presents for obligatory cast change. He states that the cast has rubbed against 1 area on his shin and a couple areas on his toes. Other than that he is tolerated the cast well. He denies any pain. He denies signs of infection. Electronic Signature(s) Signed: 09/12/2020 1:24:01 PM By: Geralyn Corwin DO Entered By: Geralyn Corwin on 09/12/2020 13:00:02 -------------------------------------------------------------------------------- Physical Exam Details Patient Name: Date of Service: [name]. 09/12/2020 8:45 A M Medical Record Number: [id number] Patient Account Number: [id number] Date of Birth/Sex: Treating RN: 1950/12/16 (70 y.o. [name] Primary Care  Provider: Nita Sells Other Clinician: Referring Provider: Treating Provider/Extender: Tomasita Crumble in  Treatment: 9 Constitutional respirations regular, non-labored and within target range for patient.[name] Psychiatric pleasant and cooperative. Notes Right foot: Metatarsal first head with small open wound and circumferential callus. This was removed and granulation tissue was present at the wound bed. No signs of infection. Electronic Signature(s) Signed: 09/12/2020 1:24:01 PM By: Geralyn Corwin DO Entered By: Geralyn Corwin on 09/12/2020 13:00:41 -------------------------------------------------------------------------------- Physician Orders Details Patient Name: Date of Service: [name]. 09/12/2020 8:45 A M Medical Record Number: [id number] Patient Account Number: [id number] Date of Birth/Sex: Treating RN: 03/15/1951 (70 y.o. [name], Millard.Loa Primary Care Provider: Nita Sells Other Clinician: Referring Provider: Treating Provider/Extender: Tomasita Crumble in Treatment: 9 Verbal / Phone Orders: No Diagnosis Coding ICD-10 Coding Code Description E11.621 Type 2 diabetes mellitus with foot ulcer E11.40 Type 2 diabetes mellitus with diabetic neuropathy, unspecified I25.10 Atherosclerotic heart disease of native coronary artery without angina pectoris I10 Essential (primary) hypertension Follow-up Appointments ppointment in 1 week. - Dr. Leanord Hawking - ***Extra time 60 minutes for cast change*** Return A ppointment in 2 weeks. - Dr. Mikey Bussing - ***Extra time 60 minutes for cast change*** Return A Bathing/ Shower/ Hygiene May shower and wash wound with soap and water. - with dressing changes only Off-Loading Total Contact Cast to Right Lower Extremity - ***Please pad 2nd,3rd, and 4th toe along with just below the right knee.**** Wound Treatment Wound #4 - Metatarsal head first Wound Laterality: Right Cleanser: Soap and Water 1 x Per Week Discharge Instructions: May shower and wash wound with dial antibacterial soap and water prior to dressing change. Peri-Wound  Care: Skin Prep 1 x Per Week Discharge Instructions: Use skin prep as directed Prim Dressing: KerraCel Ag Gelling Fiber Dressing, 2x2 in (silver alginate) 1 x Per Week ary Discharge Instructions: Apply silver alginate to wound bed as instructed Secondary Dressing: Woven Gauze Sponges 2x2 in 1 x Per Week Discharge Instructions: Apply over primary dressing as directed. Secondary Dressing: Drawtex 4x4 in 1 x Per Week Discharge Instructions: Apply over primary dressing as directed. Secured With: 90M Medipore H Soft Cloth Surgical T 4 x 2 (in/yd) 1 x Per Week ape Discharge Instructions: Secure dressing with tape as directed. Electronic Signature(s) Signed: 09/12/2020 1:24:01 PM By: Geralyn Corwin DO Entered By: Geralyn Corwin on 09/12/2020 13:21:15 -------------------------------------------------------------------------------- Problem List Details Patient Name: Date of Service: [name]. 09/12/2020 8:45 A M Medical Record Number: [id number] Patient Account Number: [id number] Date of Birth/Sex: Treating RN: Mar 16, 1951 (70 y.o. [name] Primary Care Provider: Nita Sells Other Clinician: Referring Provider: Treating Provider/Extender: Tomasita Crumble in Treatment: 9 Active Problems ICD-10 Encounter Code Description Active Date MDM Diagnosis E11.621 Type 2 diabetes mellitus with foot ulcer 07/11/2020 No Yes E11.40 Type 2 diabetes mellitus with diabetic neuropathy, unspecified 07/11/2020 No Yes I25.10 Atherosclerotic heart disease of native coronary artery without angina pectoris 07/11/2020 No Yes I10 Essential (primary) hypertension 07/11/2020 No Yes Inactive Problems Resolved Problems Electronic Signature(s) Signed: 09/12/2020 1:24:01 PM By: Geralyn Corwin DO Entered By: Geralyn Corwin on 09/12/2020 12:57:50 -------------------------------------------------------------------------------- Progress Note Details Patient Name: Date of Service: [name]. 09/12/2020 8:45 A M Medical Record Number: [id number] Patient Account Number: [id number] Date of Birth/Sex: Treating RN: January 08, 1951 (70 y.o. [name] Primary Care Provider: Nita Sells Other Clinician: Referring Provider: Treating Provider/Extender: Tomasita Crumble in Treatment: 9 Subjective Chief Complaint Information obtained from  Patient the patient arrives for evaluation of a wound on his first right metatarsal head, plantar aspect History of Present Illness (HPI) 07/12/19 Mr. [name] RisenBystiga is a 70 year old male with a past medical history of insulin-dependent type 2 diabetes, essential hypertension and coronary artery disease status post CABG in 2009. He has been seen in our clinic on three separate occasions for diabetic foot wounds. Last seen in 2018 for a left fifth metatarsal head diabetic foot ulcer. This was treated with silver alginate and offloading. Other previous visits were in 2009 and 2015. Patient states that 2 months ago he noticed a blister to the bottom of his foot. He reports using a foot pedal and applying constant pressure to this area for 3 hours a day. 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 He has noticed minimal drainage to the wound bed and denies any pain, swelling or erythema to the right foot. 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 In the past 2 months he was [place] [facility] for this issue. He states [facility] obtained an x-ray recently and reports it showed no significant findings. He was started on a cream for which he can not remember the name. He states he uses this with daily dressing changes. He was not satisfied with his care at either location and has since decided to follow-up with us. Overall he feels  well 4/14; Patient presents for his 1 week follow-up. He reports no issues in the past week. He has been using his surgical shoe and has been aggressive with offloading. He is pleased with the progress of his wound healing. He denies any drainage, increased warmth or erythema to the foot. 4/21; Patient presents for 1 week follow-up. He has been using silver alginate every other day along with a surgical shoe. He denies drainage, increased warmth or erythema to the foot. 4/28; patient presents for 1 week follow-up. He has been using silver alginate every other day along with his surgical shoe. He has try to aggressively offload the area in the past week. At this time he would like to continue with current therapy as he is pleased with the progress thus far. He would like to only do a contact cast if this were to worsen. He states that he has 5 more days off before he has to go to work. And will be trying to offload it aggressively in the next week as well. He denies drainage, increased warmth or erythema to the foot. 5/5; patient presents for 1 week follow-up. He started back at work and has been trying to offload the best he can. He continues to use silver alginate with his surgical shoe. He has no new issues or complaints today. He denies signs of infection. 5/12; patient presents for 1 week follow-up. He has been using silver alginate every other day. He tries to offload this area the best he can despite work and having to be on his feet. He has no issues or complaints today. He denies any signs of infection. 5/26; patient presents for 2-week follow-up. He has been using silver alginate to the wound. He denies a signs of infection. He has no issues or complaints today. He had ABIs with TBI's with an abnormal TBI on the right where the wound is. He  has an appointment with vein and vascular on 6/6. 6/2; patient presents for 1 week follow-up. He has been using silver alginate to the wound. He has no  issues today. He has an appointment with vein and vascular on 6/6. We discussed potentially doing the cast and he would like to go ahead and try this. 6/6; patient presents for 1 week follow-up. He has been using silver alginate to the wound. He had an appointment with vein and vascular today to discuss ABI results. He states he would like to defer the arteriogram recommended for now and try a total contact cast to see if he has any improvement with wound healing. He denies signs of infection. He reports no pain 6/9; patient presents for obligatory cast change. He states that the cast has rubbed against 1 area on his shin and a couple areas on his toes. Other than that he is tolerated the cast well. He denies any pain. He denies signs of infection. Patient History Information obtained from Patient. Family History Cancer - Mother, Diabetes - Father,Siblings, Heart Disease - Father,Siblings, Hypertension - Mother,Father,Siblings, No family history of Hereditary Spherocytosis, Kidney Disease, Lung Disease, Seizures, Stroke, Thyroid Problems, Tuberculosis. Social History Never smoker, Marital Status - Married, Alcohol Use - Never, Drug Use - No History, Caffeine Use - Daily - coffee. Medical History Eyes Patient has history of Cataracts - removed Denies history of Glaucoma, Optic Neuritis Ear/Nose/Mouth/Throat Denies history of Chronic sinus problems/congestion, Middle ear problems Hematologic/Lymphatic Denies history of Anemia, Hemophilia, Human Immunodeficiency Virus, Lymphedema, Sickle Cell Disease Respiratory Patient has history of Sleep Apnea Denies history of Aspiration, Asthma, Chronic Obstructive Pulmonary Disease (COPD), Pneumothorax, Tuberculosis Cardiovascular Patient has history of Coronary Artery Disease - CABG x5, Hypertension, Peripheral Arterial Disease Denies history of Angina, Arrhythmia, Congestive Heart Failure, Deep Vein Thrombosis, Hypotension, Myocardial Infarction,  Peripheral Venous Disease, Phlebitis, Vasculitis Gastrointestinal Denies history of Cirrhosis , Colitis, Crohnoos, Hepatitis A, Hepatitis B, Hepatitis C Endocrine Patient has history of Type II Diabetes Denies history of Type I Diabetes Genitourinary Denies history of End Stage Renal Disease Immunological Denies history of Lupus Erythematosus, Raynaudoos, Scleroderma Integumentary (Skin) Denies history of History of Burn Musculoskeletal Denies history of Gout, Rheumatoid Arthritis, Osteoarthritis, Osteomyelitis Neurologic Patient has history of Neuropathy Denies history of Dementia, Quadriplegia, Paraplegia, Seizure Disorder Oncologic Denies history of Received Chemotherapy, Received Radiation Psychiatric Denies history of Anorexia/bulimia, Confinement Anxiety Hospitalization/Surgery History - coronoary artery bypass. - cardiac cath. - back surgery. - appendectomy. - tonsillectomy. Medical A Surgical History Notes nd Cardiovascular CVA, hyperlipidemia, carotid stenosis Gastrointestinal colon polyps Genitourinary Renal Insufficiency Integumentary (Skin) (L) foot ulcer - Objective Constitutional respirations regular, non-labored and within target range for patient.. Vitals Time Taken: 9:08 AM, Height: 70 in, Weight: 225 lbs, BMI: 32.3, Temperature: 98.0 F, Pulse: 64 bpm, Respiratory Rate: 16 breaths/min, Blood Pressure: 103/62 mmHg, Capillary Blood Glucose: 158 mg/dl. General Notes: Glucose per patient report Psychiatric pleasant and cooperative. General Notes: Right foot: Metatarsal first head with small open wound and circumferential callus. This was removed and granulation tissue was present at the wound bed. No signs of infection. Integumentary (Hair, Skin) Wound #4 status is Open. Original cause of wound was Blister. The date acquired was: 05/07/2020. The wound has been in treatment 9 weeks. The wound is located on the Right Metatarsal head first. The wound measures  0.4cm length x 0.4cm width x 0.3cm depth; 0.126cm^2 area and 0.038cm^3 volume. There is Fat Layer (Subcutaneous Tissue) exposed. There is no tunneling noted, however, there  is undermining starting at 11:00 and ending at 2:00 with a maximum distance of 0.3cm. There is a medium amount of serosanguineous drainage noted. The wound margin is well defined and not attached to the wound base. There is large (67-100%) pink granulation within the wound bed. There is no necrotic tissue within the wound bed. Assessment Active Problems ICD-10 Type 2 diabetes mellitus with foot ulcer Type 2 diabetes mellitus with diabetic neuropathy, unspecified Atherosclerotic heart disease of native coronary artery without angina pectoris Essential (primary) hypertension Patient presents for obligatory cast change. He has tolerated this well we will add extra padding to the anterior shin and toes to help with pressure to this area. The wound is stable and nonviable tissue was debrided today. We will continue with silver alginate and reapply the cast. He will follow-up in 1 week Procedures Wound #4 Pre-procedure diagnosis of Wound #4 is a Diabetic Wound/Ulcer of the Lower Extremity located on the Right Metatarsal head first .Severity of Tissue Pre Debridement is: Fat layer exposed. There was a Excisional Skin/Subcutaneous Tissue Debridement with a total area of 0.36 sq cm performed by Geralyn Corwin, DO. With the following instrument(s): Curette to remove Viable and Non-Viable tissue/material. Material removed includes Subcutaneous Tissue, Slough, Skin: Dermis, Skin: Epidermis, and Fibrin/Exudate after achieving pain control using Lidocaine 4% Topical Solution. A time out was conducted at 09:28, prior to the start of the procedure. A Minimum amount of bleeding was controlled with Pressure. The procedure was tolerated well with a pain level of 0 throughout and a pain level of 0 following the procedure. Post Debridement  Measurements: 0.4cm length x 0.4cm width x 0.3cm depth; 0.038cm^3 volume. Character of Wound/Ulcer Post Debridement is improved. Severity of Tissue Post Debridement is: Fat layer exposed. Post procedure Diagnosis Wound #4: Same as Pre-Procedure Pre-procedure diagnosis of Wound #4 is a Diabetic Wound/Ulcer of the Lower Extremity located on the Right Metatarsal head first . There was a T Contact otal Cast Procedure by Geralyn Corwin, DO. Post procedure Diagnosis Wound #4: Same as Pre-Procedure Plan Follow-up Appointments: Return Appointment in 1 week. - Dr. Leanord Hawking - ***Extra time 60 minutes for cast change*** Return Appointment in 2 weeks. - Dr. Mikey Bussing - ***Extra time 60 minutes for cast change*** Bathing/ Shower/ Hygiene: May shower and wash wound with soap and water. - with dressing changes only Off-Loading: T Contact Cast to Right Lower Extremity - ***Please pad 2nd,3rd, and 4th toe along with just below the right knee.**** otal WOUND #4: - Metatarsal head first Wound Laterality: Right Cleanser: Soap and Water 1 x Per Week/ Discharge Instructions: May shower and wash wound with dial antibacterial soap and water prior to dressing change. Peri-Wound Care: Skin Prep 1 x Per Week/ Discharge Instructions: Use skin prep as directed Prim Dressing: KerraCel Ag Gelling Fiber Dressing, 2x2 in (silver alginate) 1 x Per Week/ ary Discharge Instructions: Apply silver alginate to wound bed as instructed Secondary Dressing: Woven Gauze Sponges 2x2 in 1 x Per Week/ Discharge Instructions: Apply over primary dressing as directed. Secondary Dressing: Drawtex 4x4 in 1 x Per Week/ Discharge Instructions: Apply over primary dressing as directed. Secured With: 55M Medipore H Soft Cloth Surgical T 4 x 2 (in/yd) 1 x Per Week/ ape Discharge Instructions: Secure dressing with tape as directed. 1. In office sharp debridement 2. TCC 3. Follow-up in 1 week Electronic Signature(s) Signed: 09/12/2020 1:24:01 PM  By: Geralyn Corwin DO Entered By: Geralyn Corwin on 09/12/2020 13:23:02 -------------------------------------------------------------------------------- HxROS Details Patient Name: Date of Service:  Anat[place], [employer]. 09/12/2020 8:45 A M Medical Record Number: [id number] Patient Account Number: [id number] Date of Birth/Sex: Treating RN: 1950-11-25 (70 y.o. [name] Primary Care Provider: Nita Sells Other Clinician: Referring Provider: Treating Provider/Extender: Tomasita Crumble in Treatment: 9 Information Obtained From Patient Eyes Medical History: Positive for: Cataracts - removed Negative for: Glaucoma; Optic Neuritis Ear/Nose/Mouth/Throat Medical History: Negative for: Chronic sinus problems/congestion; Middle ear problems Hematologic/Lymphatic Medical History: Negative for: Anemia; Hemophilia; Human Immunodeficiency Virus; Lymphedema; Sickle Cell Disease Respiratory Medical History: Positive for: Sleep Apnea Negative for: Aspiration; Asthma; Chronic Obstructive Pulmonary Disease (COPD); Pneumothorax; Tuberculosis Cardiovascular Medical History: Positive for: Coronary Artery Disease - CABG x5; Hypertension; Peripheral Arterial Disease Negative for: Angina; Arrhythmia; Congestive Heart Failure; Deep Vein Thrombosis; Hypotension; Myocardial Infarction; Peripheral Venous Disease; Phlebitis; Vasculitis Past Medical History Notes: CVA, hyperlipidemia, carotid stenosis Gastrointestinal Medical History: Negative for: Cirrhosis ; Colitis; Crohns; Hepatitis A; Hepatitis B; Hepatitis C Past Medical History Notes: colon polyps Endocrine Medical History: Positive for: Type II Diabetes Negative for: Type I Diabetes Time with diabetes: over 10 years Treated with: Insulin, Oral agents Blood sugar tested every day: Yes Tested : 2-3 times a day Blood sugar testing results: Breakfast: 160 Genitourinary Medical History: Negative for: End Stage Renal  Disease Past Medical History Notes: Renal Insufficiency Immunological Medical History: Negative for: Lupus Erythematosus; Raynauds; Scleroderma Integumentary (Skin) Medical History: Negative for: History of Burn Past Medical History Notes: (L) foot ulcer - Musculoskeletal Medical History: Negative for: Gout; Rheumatoid Arthritis; Osteoarthritis; Osteomyelitis Neurologic Medical History: Positive for: Neuropathy Negative for: Dementia; Quadriplegia; Paraplegia; Seizure Disorder Oncologic Medical History: Negative for: Received Chemotherapy; Received Radiation Psychiatric Medical History: Negative for: Anorexia/bulimia; Confinement Anxiety HBO Extended History Items Eyes: Cataracts Immunizations Pneumococcal Vaccine: Received Pneumococcal Vaccination: No Implantable Devices No devices added Hospitalization / Surgery History Type of Hospitalization/Surgery coronoary artery bypass cardiac cath back surgery appendectomy tonsillectomy Family and Social History Cancer: Yes - Mother; Diabetes: Yes - Father,Siblings; Heart Disease: Yes - Father,Siblings; Hereditary Spherocytosis: No; Hypertension: Yes - Mother,Father,Siblings; Kidney Disease: No; Lung Disease: No; Seizures: No; Stroke: No; Thyroid Problems: No; Tuberculosis: No; Never smoker; Marital Status - Married; Alcohol Use: Never; Drug Use: No History; Caffeine Use: Daily - coffee; Financial Concerns: No; Food, Clothing or Shelter Needs: No; Support System Lacking: No; Transportation Concerns: No Electronic Signature(s) Signed: 09/12/2020 1:24:01 PM By: Geralyn Corwin DO Signed: 09/12/2020 6:02:04 PM By: Shawn Stall Entered By: Geralyn Corwin on 09/12/2020 13:00:08 -------------------------------------------------------------------------------- Total Contact Cast Details Patient Name: Date of Service: [name]. 09/12/2020 8:45 A M Medical Record Number: [id number] Patient Account Number: [id number] Date of  Birth/Sex: Treating RN: January 05, 1951 (70 y.o. [name] Primary Care Provider: Nita Sells Other Clinician: Referring Provider: Treating Provider/Extender: Tomasita Crumble in Treatment: 9 T Contact Cast Applied for Wound Assessment: otal Wound #4 Right Metatarsal head first Performed By: Physician Geralyn Corwin, DO Post Procedure Diagnosis Same as Pre-procedure Electronic Signature(s) Signed: 09/12/2020 1:24:01 PM By: Geralyn Corwin DO Signed: 09/12/2020 6:02:04 PM By: Shawn Stall Entered By: Shawn Stall on 09/12/2020 09:35:17 -------------------------------------------------------------------------------- SuperBill Details Patient Name: Date of Service: [name]. 09/12/2020 Medical Record Number: [id number] Patient Account Number: [id number] Date of Birth/Sex: Treating RN: Sep 19, 1950 (69 y.o. [name] Primary Care Provider: Nita Sells Other Clinician: Referring Provider: Treating Provider/Extender: Tomasita Crumble in Treatment: 9 Diagnosis Coding ICD-10 Codes Code Description [phone] Type 2 diabetes mellitus with foot ulcer E11.40 Type 2 diabetes mellitus  with diabetic neuropathy, unspecified I25.10 Atherosclerotic heart disease of native coronary artery without angina pectoris I10 Essential (primary) hypertension Facility Procedures CPT4 Code: [id number] Description: 11042 - DEB SUBQ TISSUE 20 SQ CM/< ICD-10 Diagnosis Description E11.621 Type 2 diabetes mellitus with foot ulcer Modifier: Quantity: 1 Physician Procedures : CPT4 Code Description Modifier [id number] 11042 - WC PHYS SUBQ TISS 20 SQ CM ICD-10 Diagnosis Description E11.621 Type 2 diabetes mellitus with foot ulcer Quantity: 1 Electronic Signature(s) Signed: 09/12/2020 1:24:01 PM By: Geralyn Corwin DO Entered By: Geralyn Corwin on 09/12/2020 13:23:11

## 2020-09-12 NOTE — Progress Notes (Signed)
[name], [name] ([id number]) Visit Report for 09/12/2020 Arrival Information Details Patient Name: Date of Service: [name], [name]. 09/12/2020 8:45 A M Medical Record Number: [id number] Patient Account Number: [id number] Date of Birth/Sex: Treating RN: 01-01-1951 (70 y.o. Marcheta Grammes Primary Care Lurae Hornbrook: Allyn Kenner Other Clinician: Referring Herminia Warren: Treating Sosaia Pittinger/Extender: Dierdre Harness in Treatment: 9 Visit Information History Since Last Visit Added or deleted any medications: No Patient Arrived: Ambulatory Any new allergies or adverse reactions: No Arrival Time: 09:07 Had a fall or experienced change in No Accompanied By: wife activities of daily living that may affect Transfer Assistance: None risk of falls: Patient Identification Verified: Yes Signs or symptoms of abuse/neglect since last visito No Secondary Verification Process Completed: Yes Hospitalized since last visit: No Patient Requires Transmission-Based Precautions: No Implantable device outside of the clinic excluding No Patient Has Alerts: Yes cellular tissue based products placed in the center Patient Alerts: Patient on Blood Thinner since last visit: R ABI non compressible Has Dressing in Place as Prescribed: Yes Has Footwear/Offloading in Place as Prescribed: Yes Right: T Contact Cast otal Pain Present Now: No Electronic Signature(s) Signed: 09/12/2020 5:42:55 PM By: Lorrin Jackson Entered By: Lorrin Jackson on 09/12/2020 09:08:16 -------------------------------------------------------------------------------- Encounter Discharge Information Details Patient Name: Date of Service: [name]. 09/12/2020 8:45 A M Medical Record Number: [id number] Patient Account Number: [id number] Date of Birth/Sex: Treating RN: July 30, 1950 (70 y.o. Marcheta Grammes Primary Care Cleo Santucci: Allyn Kenner Other Clinician: Referring Laikynn Pollio: Treating Ahrianna Siglin/Extender: Dierdre Harness in Treatment: 9 Encounter Discharge Information Items Post Procedure Vitals Discharge Condition: Stable Temperature (F): 98.0 Ambulatory Status: Ambulatory Pulse (bpm): 64 Discharge Destination: Home Respiratory Rate (breaths/min): 16 Transportation: Private Auto Blood Pressure (mmHg): 103/62 Accompanied By: wife Schedule Follow-up Appointment: Yes Clinical Summary of Care: Provided on 09/12/2020 Form Type Recipient Paper Patient Patient Electronic Signature(s) Signed: 09/12/2020 11:10:07 AM By: Lorrin Jackson Entered By: Lorrin Jackson on 09/12/2020 11:10:07 -------------------------------------------------------------------------------- Lower Extremity Assessment Details Patient Name: Date of Service: [name]. 09/12/2020 8:45 A M Medical Record Number: [id number] Patient Account Number: [id number] Date of Birth/Sex: Treating RN: June 11, 1950 (70 y.o. Marcheta Grammes Primary Care Reita Shindler: Allyn Kenner Other Clinician: Referring Kainon Varady: Treating Ladoris Lythgoe/Extender: Dierdre Harness in Treatment: 9 Edema Assessment Assessed: [Left: No] Patrice Paradise: Yes] Edema: [Left: N] [Right: o] Calf Left: Right: Point of Measurement: 32 cm From Medial Instep 42 cm Ankle Left: Right: Point of Measurement: 11 cm From Medial Instep 26 cm Vascular Assessment Pulses: Dorsalis Pedis Palpable: [Right:Yes] Electronic Signature(s) Signed: 09/12/2020 5:42:55 PM By: Lorrin Jackson Entered By: Lorrin Jackson on 09/12/2020 09:09:52 -------------------------------------------------------------------------------- Multi Wound Chart Details Patient Name: Date of Service: [name]. 09/12/2020 8:45 A M Medical Record Number: [id number] Patient Account Number: [id number] Date of Birth/Sex: Treating RN: June 08, 1950 (70 y.o. Hessie Diener Primary Care Araceli Arango: Allyn Kenner Other Clinician: Referring Abiola Behring: Treating Yosmar Ryker/Extender: Dierdre Harness in Treatment: 9 Vital Signs Height(in): 70 Capillary Blood Glucose(mg/dl): 158 Weight(lbs): 225 Pulse(bpm): 43 Body Mass Index(BMI): 28 Blood Pressure(mmHg): 103/62 Temperature(F): 98.0 Respiratory Rate(breaths/min): 16 Photos: [4:No Photos Right Metatarsal head first] [N/A:N/A N/A] Wound Location: [4:Blister] [N/A:N/A] Wounding Event: [4:Diabetic Wound/Ulcer of the Lower] [N/A:N/A] Primary Etiology: [4:Extremity Cataracts, Sleep Apnea, Coronary] [N/A:N/A] Comorbid History: [4:Artery Disease, Hypertension, Peripheral Arterial Disease, Type II Diabetes, Neuropathy 05/07/2020] [N/A:N/A] Date Acquired: [4:9] [N/A:N/A] Weeks of Treatment: [4:Open] [N/A:N/A] Wound Status: [4:0.4x0.4x0.3] [N/A:N/A] Measurements L x W x D (cm) [4:0.126] [N/A:N/A]  A (cm) : rea [4:0.038] [N/A:N/A] Volume (cm) : [4:-100.00%] [N/A:N/A] % Reduction in A rea: [4:-100.00%] [N/A:N/A] % Reduction in Volume: [4:11] Starting Position 1 (o'clock): [4:2] Ending Position 1 (o'clock): [4:0.3] Maximum Distance 1 (cm): [4:Yes] [N/A:N/A] Undermining: [4:Grade 2] [N/A:N/A] Classification: [4:Medium] [N/A:N/A] Exudate A mount: [4:Serosanguineous] [N/A:N/A] Exudate Type: [4:red, brown] [N/A:N/A] Exudate Color: [4:Well defined, not attached] [N/A:N/A] Wound Margin: [4:Large (67-100%)] [N/A:N/A] Granulation A mount: [4:Pink] [N/A:N/A] Granulation Quality: [4:None Present (0%)] [N/A:N/A] Necrotic A mount: [4:Fat Layer (Subcutaneous Tissue): Yes N/A] Exposed Structures: [4:Fascia: No Tendon: No Muscle: No Joint: No Bone: No Medium (34-66%)] [N/A:N/A] Epithelialization: [4:Debridement - Excisional] [N/A:N/A] Debridement: Pre-procedure Verification/Time Out 09:28 [N/A:N/A] Taken: [4:Lidocaine 4% Topical Solution] [N/A:N/A] Pain Control: [4:Subcutaneous, Slough] [N/A:N/A] Tissue Debrided: [4:Skin/Subcutaneous Tissue] [N/A:N/A] Level: [4:0.36] [N/A:N/A] Debridement A (sq cm): [4:rea Curette]  [N/A:N/A] Instrument: [4:Minimum] [N/A:N/A] Bleeding: [4:Pressure] [N/A:N/A] Hemostasis A chieved: [4:0] [N/A:N/A] Procedural Pain: [4:0] [N/A:N/A] Post Procedural Pain: [4:Procedure was tolerated well] [N/A:N/A] Debridement Treatment Response: [4:0.4x0.4x0.3] [N/A:N/A] Post Debridement Measurements L x W x D (cm) [4:0.038] [N/A:N/A] Post Debridement Volume: (cm) [4:Debridement] [N/A:N/A] Procedures Performed: [4:T Contact Cast otal] Treatment Notes Wound #4 (Metatarsal head first) Wound Laterality: Right Cleanser Soap and Water Discharge Instruction: May shower and wash wound with dial antibacterial soap and water prior to dressing change. Peri-Wound Care Skin Prep Discharge Instruction: Use skin prep as directed Topical Primary Dressing KerraCel Ag Gelling Fiber Dressing, 2x2 in (silver alginate) Discharge Instruction: Apply silver alginate to wound bed as instructed Secondary Dressing Drawtex 4x4 in Discharge Instruction: Apply over primary dressing as directed. Woven Gauze Sponges 2x2 in Discharge Instruction: Apply over primary dressing as directed. Secured With 31M Medipore H Soft Cloth Surgical T 4 x 2 (in/yd) ape Discharge Instruction: Secure dressing with tape as directed. Compression Wrap Compression Stockings Add-Ons Electronic Signature(s) Signed: 09/12/2020 1:24:01 PM By: Kalman Shan DO Signed: 09/12/2020 6:02:04 PM By: Deon Pilling Entered By: Kalman Shan on 09/12/2020 12:57:59 -------------------------------------------------------------------------------- Multi-Disciplinary Care Plan Details Patient Name: Date of Service: [name]. 09/12/2020 8:45 A M Medical Record Number: [id number] Patient Account Number: [id number] Date of Birth/Sex: Treating RN: Nov 11, 1950 (70 y.o. Hessie Diener Primary Care Lindyn Vossler: Allyn Kenner Other Clinician: Referring Vinita Prentiss: Treating Kenzee Bassin/Extender: Dierdre Harness in Treatment:  9 Multidisciplinary Care Plan reviewed with physician Active Inactive Nutrition Nursing Diagnoses: Impaired glucose control: actual or potential Potential for alteratiion in Nutrition/Potential for imbalanced nutrition Goals: Patient/caregiver agrees to and verbalizes understanding of need to use nutritional supplements and/or vitamins as prescribed Date Initiated: 07/11/2020 Date Inactivated: 08/01/2020 Target Resolution Date: 08/09/2020 Goal Status: Met Patient/caregiver will maintain therapeutic glucose control Date Initiated: 07/11/2020 Target Resolution Date: 10/03/2020 Goal Status: Active Interventions: Assess HgA1c results as ordered upon admission and as needed Assess patient nutrition upon admission and as needed per policy Provide education on elevated blood sugars and impact on wound healing Provide education on nutrition Treatment Activities: Education provided on Nutrition : 08/29/2020 Notes: Electronic Signature(s) Signed: 09/12/2020 6:02:04 PM By: Deon Pilling Entered By: Deon Pilling on 09/12/2020 09:20:59 -------------------------------------------------------------------------------- Pain Assessment Details Patient Name: Date of Service: [name]. 09/12/2020 8:45 A M Medical Record Number: [id number] Patient Account Number: [id number] Date of Birth/Sex: Treating RN: 1950/05/17 (70 y.o. Marcheta Grammes Primary Care Loudon Krakow: Allyn Kenner Other Clinician: Referring Karnell Vanderloop: Treating Subrina Vecchiarelli/Extender: Dierdre Harness in Treatment: 9 Active Problems Location of Pain Severity and Description of Pain Patient Has Paino No Site Locations Pain Management and Medication Current Pain Management:  Electronic Signature(s) Signed: 09/12/2020 5:42:55 PM By: Lorrin Jackson Entered By: Lorrin Jackson on 09/12/2020 09:09:06 -------------------------------------------------------------------------------- Patient/Caregiver Education Details Patient  Name: Date of Service: [name] 6/9/2022andnbsp8:45 A M Medical Record Number: [id number] Patient Account Number: [id number] Date of Birth/Gender: Treating RN: Nov 07, 1950 (70 y.o. Hessie Diener Primary Care Physician: Allyn Kenner Other Clinician: Referring Physician: Treating Physician/Extender: Dierdre Harness in Treatment: 9 Education Assessment Education Provided To: Patient Education Topics Provided Nutrition: Handouts: Nutrition Methods: Explain/Verbal Responses: Reinforcements needed Electronic Signature(s) Signed: 09/12/2020 6:02:04 PM By: Deon Pilling Entered By: Deon Pilling on 09/12/2020 09:21:12 -------------------------------------------------------------------------------- Wound Assessment Details Patient Name: Date of Service: [name]. 09/12/2020 8:45 A M Medical Record Number: [id number] Patient Account Number: [id number] Date of Birth/Sex: Treating RN: 1951-03-11 (69 y.o. Marcheta Grammes Primary Care Alexxus Sobh: Allyn Kenner Other Clinician: Referring Jacorian Golaszewski: Treating Ustin Cruickshank/Extender: Dierdre Harness in Treatment: 9 Wound Status Wound Number: 4 Primary Diabetic Wound/Ulcer of the Lower Extremity Etiology: Wound Location: Right Metatarsal head first Wound Open Wounding Event: Blister Status: Date Acquired: 05/07/2020 Comorbid Cataracts, Sleep Apnea, Coronary Artery Disease, Hypertension, Weeks Of Treatment: 9 History: Peripheral Arterial Disease, Type II Diabetes, Neuropathy Clustered Wound: No Wound Measurements Length: (cm) 0.4 Width: (cm) 0.4 Depth: (cm) 0.3 Area: (cm) 0.126 Volume: (cm) 0.038 % Reduction in Area: -100% % Reduction in Volume: -100% Epithelialization: Medium (34-66%) Tunneling: No Undermining: Yes Starting Position (o'clock): 11 Ending Position (o'clock): 2 Maximum Distance: (cm) 0.3 Wound Description Classification: Grade 2 Wound Margin: Well defined, not  attached Exudate Amount: Medium Exudate Type: Serosanguineous Exudate Color: red, brown Foul Odor After Cleansing: No Slough/Fibrino No Wound Bed Granulation Amount: Large (67-100%) Exposed Structure Granulation Quality: Pink Fascia Exposed: No Necrotic Amount: None Present (0%) Fat Layer (Subcutaneous Tissue) Exposed: Yes Tendon Exposed: No Muscle Exposed: No Joint Exposed: No Bone Exposed: No Treatment Notes Wound #4 (Metatarsal head first) Wound Laterality: Right Cleanser Soap and Water Discharge Instruction: May shower and wash wound with dial antibacterial soap and water prior to dressing change. Peri-Wound Care Skin Prep Discharge Instruction: Use skin prep as directed Topical Primary Dressing KerraCel Ag Gelling Fiber Dressing, 2x2 in (silver alginate) Discharge Instruction: Apply silver alginate to wound bed as instructed Secondary Dressing Drawtex 4x4 in Discharge Instruction: Apply over primary dressing as directed. Woven Gauze Sponges 2x2 in Discharge Instruction: Apply over primary dressing as directed. Secured With 83M Medipore H Soft Cloth Surgical T 4 x 2 (in/yd) ape Discharge Instruction: Secure dressing with tape as directed. Compression Wrap Compression Stockings Add-Ons Electronic Signature(s) Signed: 09/12/2020 5:42:55 PM By: Lorrin Jackson Entered By: Lorrin Jackson on 09/12/2020 09:10:52 -------------------------------------------------------------------------------- Vitals Details Patient Name: Date of Service: [name]. 09/12/2020 8:45 A M Medical Record Number: [id number] Patient Account Number: [id number] Date of Birth/Sex: Treating RN: 12-Jan-1951 (70 y.o. Marcheta Grammes Primary Care Taleshia Luff: Allyn Kenner Other Clinician: Referring Kinser Fellman: Treating Tiaunna Buford/Extender: Dierdre Harness in Treatment: 9 Vital Signs Time Taken: 09:08 Temperature (F): 98.0 Height (in): 70 Pulse (bpm): 64 Weight (lbs):  225 Respiratory Rate (breaths/min): 16 Body Mass Index (BMI): 32.3 Blood Pressure (mmHg): 103/62 Capillary Blood Glucose (mg/dl): 158 Reference Range: 80 - 120 mg / dl Notes Glucose per patient report Electronic Signature(s) Signed: 09/12/2020 5:42:55 PM By: Lorrin Jackson Entered By: Lorrin Jackson on 09/12/2020 09:08:59

## 2020-09-17 DIAGNOSIS — A084 Viral intestinal infection, unspecified: Secondary | ICD-10-CM | POA: Diagnosis not present

## 2020-09-17 DIAGNOSIS — L299 Pruritus, unspecified: Secondary | ICD-10-CM | POA: Diagnosis not present

## 2020-09-19 ENCOUNTER — Encounter (HOSPITAL_BASED_OUTPATIENT_CLINIC_OR_DEPARTMENT_OTHER): Payer: Medicare HMO | Admitting: Internal Medicine

## 2020-09-19 ENCOUNTER — Other Ambulatory Visit: Payer: Self-pay

## 2020-09-19 ENCOUNTER — Other Ambulatory Visit (HOSPITAL_COMMUNITY)
Admission: RE | Admit: 2020-09-19 | Discharge: 2020-09-19 | Disposition: A | Discharge status: Home / Self Care | Payer: Medicare HMO | Source: Other Acute Inpatient Hospital | Attending: Internal Medicine | Admitting: Internal Medicine

## 2020-09-19 DIAGNOSIS — E1151 Type 2 diabetes mellitus with diabetic peripheral angiopathy without gangrene: Secondary | ICD-10-CM | POA: Diagnosis not present

## 2020-09-19 DIAGNOSIS — E1140 Type 2 diabetes mellitus with diabetic neuropathy, unspecified: Secondary | ICD-10-CM | POA: Diagnosis not present

## 2020-09-19 DIAGNOSIS — Z794 Long term (current) use of insulin: Secondary | ICD-10-CM | POA: Diagnosis not present

## 2020-09-19 DIAGNOSIS — L97512 Non-pressure chronic ulcer of other part of right foot with fat layer exposed: Secondary | ICD-10-CM | POA: Diagnosis not present

## 2020-09-19 DIAGNOSIS — E11621 Type 2 diabetes mellitus with foot ulcer: Secondary | ICD-10-CM | POA: Diagnosis not present

## 2020-09-19 DIAGNOSIS — I10 Essential (primary) hypertension: Secondary | ICD-10-CM | POA: Diagnosis not present

## 2020-09-20 NOTE — Progress Notes (Signed)
ZONG, MCQUARRIE ([id number]) Visit Report for 09/19/2020 Debridement Details Patient Name: Date of Service: [name], [name]. 09/19/2020 8:45 A M Medical Record Number: [id number] Patient Account Number: [id number] Date of Birth/Sex: Treating RN: 13-May-1950 (70 y.o. [name], [name] Primary Care Provider: Allyn Kenner Other Clinician: Referring Provider: Treating Provider/Extender: Adolm Joseph in Treatment: 10 Debridement Performed for Assessment: Wound #4 Right Metatarsal head first Performed By: Physician Ricard Dillon., MD Debridement Type: Debridement Severity of Tissue Pre Debridement: Fat layer exposed Level of Consciousness (Pre-procedure): Awake and Alert Pre-procedure Verification/Time Out Yes - 09:40 Taken: Start Time: 09:41 Pain Control: Lidocaine 4% T opical Solution T Area Debrided (L x W): otal 0.2 (cm) x 0.2 (cm) = 0.04 (cm) Tissue and other material debrided: Viable, Non-Viable, Callus, Subcutaneous, Skin: Dermis , Skin: Epidermis, Fibrin/Exudate Level: Skin/Subcutaneous Tissue Debridement Description: Excisional Instrument: Curette Bleeding: Minimum Hemostasis Achieved: Pressure End Time: 09:44 Procedural Pain: 0 Post Procedural Pain: 0 Response to Treatment: Procedure was tolerated well Level of Consciousness (Post- Awake and Alert procedure): Post Debridement Measurements of Total Wound Length: (cm) 0.2 Width: (cm) 0.2 Depth: (cm) 0.1 Volume: (cm) 0.003 Character of Wound/Ulcer Post Debridement: Improved Severity of Tissue Post Debridement: Fat layer exposed Post Procedure Diagnosis Same as Pre-procedure Electronic Signature(s) Signed: 09/19/2020 5:31:56 PM By: Deon Pilling Signed: 09/20/2020 10:33:35 AM By: Linton Ham MD Entered By: Linton Ham on 09/19/2020 10:14:06 -------------------------------------------------------------------------------- HPI Details Patient Name: Date of Service: [name].  09/19/2020 8:45 A M Medical Record Number: [id number] Patient Account Number: [id number] Date of Birth/Sex: Treating RN: 1950/09/17 (70 y.o. Hessie Diener Primary Care Provider: Allyn Kenner Other Clinician: Referring Provider: Treating Provider/Extender: Adolm Joseph in Treatment: 10 History of Present Illness HPI Description: 07/12/19 [name] is a 70 year old male with a past medical history of insulin-dependent type 2 diabetes, essential hypertension and coronary artery disease status post CABG in 2009. He has been seen in our clinic on three separate occasions for diabetic foot wounds. Last seen in 2018 for a left fifth metatarsal head diabetic foot ulcer. This was treated with silver alginate and offloading. Other previous visits were in 2009 and 2015. Patient states that 2 months ago he noticed a blister to the bottom of his foot. He reports using a foot pedal and applying constant pressure to this area for 3 hours a day. 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 He has noticed minimal drainage to the wound bed and denies any pain, swelling or erythema to the right foot. 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 In the past 2 months he was seen at [facility] in [place], [place] and [facility] for this issue. He states [employer] obtained an x-ray recently and reports it showed no significant findings. He was started on a cream for which he can not remember the name. He states he uses this with daily dressing changes. He was not satisfied with his care at either location and has since decided to follow-up with [place]. Overall he feels well 4/14; Patient presents for his 1 week follow-up. He reports no issues in the past week. He has been using his surgical shoe and has been aggressive with offloading.  He is pleased with the progress of his wound healing. He denies any drainage, increased  warmth or erythema to the foot. 4/21; Patient presents for 1 week follow-up. He has been using silver alginate every other day along with a surgical shoe. He denies drainage, increased warmth or erythema to the foot. 4/28; patient presents for 1 week follow-up. He has been using silver alginate every other day along with his surgical shoe. He has try to aggressively offload the area in the past week. At this time he would like to continue with current therapy as he is pleased with the progress thus far. He would like to only do a contact cast if this were to worsen. He states that he has 5 more days off before he has to go to work. And will be trying to offload it aggressively in the next week as well. He denies drainage, increased warmth or erythema to the foot. 5/5; patient presents for 1 week follow-up. He started back at work and has been trying to offload the best he can. He continues to use silver alginate with his surgical shoe. He has no new issues or complaints today. He denies signs of infection. 5/12; patient presents for 1 week follow-up. He has been using silver alginate every other day. He tries to offload this area the best he can despite work and having to be on his feet. He has no issues or complaints today. He denies any signs of infection. 5/26; patient presents for 2-week follow-up. He has been using silver alginate to the wound. He denies a signs of infection. He has no issues or complaints today. He had ABIs with TBI's with an abnormal TBI on the right where the wound is. He has an appointment with vein and vascular on 6/6. 6/2; patient presents for 1 week follow-up. He has been using silver alginate to the wound. He has no issues today. He has an appointment with vein and vascular on 6/6. We discussed potentially doing the cast and he would like to go ahead and try this. 6/6; patient presents  for 1 week follow-up. He has been using silver alginate to the wound. He had an appointment with vein and vascular today to discuss ABI results. He states he would like to defer the arteriogram recommended for now and try a total contact cast to see if he has any improvement with wound healing. He denies signs of infection. He reports no pain 6/9; patient presents for obligatory cast change. He states that the cast has rubbed against 1 area on his shin and a couple areas on his toes. Other than that he is tolerated the cast well. He denies any pain. He denies signs of infection. 6/16; this patient I do not usually see he has an area over the first metatarsal head he has been using silver alginate under a total contact cast and the wound seems to be making good progress. He arrives today after the cast was taken off his wife noticed and I her intake nurse 3 subcutaneous blisters in the medial foot just beyond the first met head. These had a purulent look to them. I opened 1 with a needle and cultured it. There is no tenderness however no erythema. He does not have a skin condition does not have a history of problems on his feet or hands. He apparently was offered an angiogram by vein and vascular but did not proceed with this assessment. No claudication symptoms were obtainable Electronic Signature(s) Signed: 09/20/2020 10:33:35 AM By: Linton Ham MD Entered By: Linton Ham on 09/19/2020 10:16:10 -------------------------------------------------------------------------------- Physical  Exam Details Patient Name: Date of Service: PRESLEY, SUMMERLIN 09/19/2020 8:45 A M Medical Record Number: [id number] Patient Account Number: [id number] Date of Birth/Sex: Treating RN: 24-Mar-1951 (70 y.o. Hessie Diener Primary Care Provider: Allyn Kenner Other Clinician: Referring Provider: Treating Provider/Extender: Adolm Joseph in Treatment: 10 Constitutional Patient is hypotensive.  However appears well. Pulse regular and within target range for patient.[name] Respirations regular, non-labored and within target range.. Temperature is normal and within the target range for the patient.[name] Appears in no distress. Notes Wound exam; right foot Metatarsal head I removed some callus thick skin and debris on the wound surface this appears to be smaller and really looks quite healthy. Nice granulation present. No evidence of infection in this area HOWEVER distally to the met head and the medial foot there are 3 what appear to be blistered areas. I open 1 of these there is some whitish fluid not overtly purulent but I obtained this for culture. Not overtly infected Electronic Signature(s) Signed: 09/20/2020 10:33:35 AM By: Linton Ham MD Entered By: Linton Ham on 09/19/2020 10:17:39 -------------------------------------------------------------------------------- Physician Orders Details Patient Name: Date of Service: [name]. 09/19/2020 8:45 A M Medical Record Number: [id number] Patient Account Number: [id number] Date of Birth/Sex: Treating RN: May 06, 1950 (70 y.o. Hessie Diener Primary Care Provider: Allyn Kenner Other Clinician: Referring Provider: Treating Provider/Extender: Adolm Joseph in Treatment: 10 Verbal / Phone Orders: No Diagnosis Coding ICD-10 Coding Code Description E11.621 Type 2 diabetes mellitus with foot ulcer E11.40 Type 2 diabetes mellitus with diabetic neuropathy, unspecified I25.10 Atherosclerotic heart disease of native coronary artery without angina pectoris I10 Essential (primary) hypertension Follow-up Appointments ppointment in 1 week. - Dr. Heber [place]- ***Extra time 60 minutes for cast change*** Return A Bathing/ Shower/ Hygiene May shower and wash wound with soap and water. - with dressing changes only Off-Loading Total Contact Cast to Right Lower Extremity - ***Please pad 2nd,3rd, and 4th toe along with just  below the right knee.**** Non Wound Condition Protect area with: - below wound area cover with gauze for protection. ***Patient to pick up oral antibiotics at pharmacy.**** Wound Treatment Wound #4 - Metatarsal head first Wound Laterality: Right Cleanser: Soap and Water 1 x Per Week Discharge Instructions: May shower and wash wound with dial antibacterial soap and water prior to dressing change. Peri-Wound Care: Skin Prep 1 x Per Week Discharge Instructions: Use skin prep as directed Prim Dressing: KerraCel Ag Gelling Fiber Dressing, 2x2 in (silver alginate) 1 x Per Week ary Discharge Instructions: Apply silver alginate to wound bed as instructed Secondary Dressing: Woven Gauze Sponges 2x2 in 1 x Per Week Discharge Instructions: Apply over primary dressing as directed. Secondary Dressing: Drawtex 4x4 in 1 x Per Week Discharge Instructions: Apply over primary dressing as directed. Secured With: 50M Medipore H Soft Cloth Surgical T 4 x 2 (in/yd) 1 x Per Week ape Discharge Instructions: Secure dressing with tape as directed. Laboratory erobe culture (MICRO) - culture of right foot Bacteria identified in Unspecified specimen by A LOINC Code: 956-2 Convenience Name: Areobic culture-specimen not specified Patient Medications llergies: penicillin A Notifications Medication Indication Start End wound infection 09/19/2020 doxycycline monohydrate DOSE oral 100 mg capsule - 1 capsule oral bid for 7 days Electronic Signature(s) Signed: 09/19/2020 5:31:56 PM By: Deon Pilling Signed: 09/20/2020 10:33:35 AM By: Linton Ham MD Previous Signature: 09/19/2020 9:55:17 AM Version By: Linton Ham MD Entered By: Deon Pilling on 09/19/2020 09:57:35 -------------------------------------------------------------------------------- Problem List Details Patient Name: Date  of Service: SYLAR, VOONG 09/19/2020 8:45 A M Medical Record Number: [id number] Patient Account Number: [id number] Date of  Birth/Sex: Treating RN: August 01, 1950 (70 y.o. Hessie Diener Primary Care Provider: Allyn Kenner Other Clinician: Referring Provider: Treating Provider/Extender: Sandria Senter, Moses Manners in Treatment: 10 Active Problems ICD-10 Encounter Code Description Active Date MDM Diagnosis E11.621 Type 2 diabetes mellitus with foot ulcer 07/11/2020 No Yes E11.40 Type 2 diabetes mellitus with diabetic neuropathy, unspecified 07/11/2020 No Yes I25.10 Atherosclerotic heart disease of native coronary artery without angina pectoris 07/11/2020 No Yes I10 Essential (primary) hypertension 07/11/2020 No Yes Inactive Problems Resolved Problems Electronic Signature(s) Signed: 09/20/2020 10:33:35 AM By: Linton Ham MD Entered By: Linton Ham on 09/19/2020 10:13:46 -------------------------------------------------------------------------------- Progress Note Details Patient Name: Date of Service: [name]. 09/19/2020 8:45 A M Medical Record Number: [id number] Patient Account Number: [id number] Date of Birth/Sex: Treating RN: 01/14/1951 (70 y.o. Hessie Diener Primary Care Provider: Other Clinician: Allyn Kenner Referring Provider: Treating Provider/Extender: Adolm Joseph in Treatment: 10 Subjective History of Present Illness (HPI) 07/12/19 [name] is a 70 year old male with a past medical history of insulin-dependent type 2 diabetes, essential hypertension and coronary artery disease status post CABG in 2009. He has been seen in our clinic on three separate occasions for diabetic foot wounds. Last seen in 2018 for a left fifth metatarsal head diabetic foot ulcer. This was treated with silver alginate and offloading. Other previous visits were in 2009 and 2015. Patient states that 2 months ago he noticed a blister to the bottom of his foot. He reports using a foot pedal and applying constant pressure to this area for 3 hours a day. 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 He has noticed minimal drainage to the wound bed and denies any pain, swelling or erythema to the right foot. 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 In the past 2 months he was seen at [facility] in [place], [place] and [facility] for this issue. He states [employer] obtained an x-ray recently and reports it showed no significant findings. He was started on a cream for which he can not remember the name. He states he uses this with daily dressing changes. He was not satisfied with his care at either location and has since decided to follow-up with [place]. Overall he feels well 4/14; Patient presents for his 1 week follow-up. He reports no issues in the past week. He has been using his surgical shoe and has been aggressive with offloading. He is pleased with the progress of his wound healing. He denies any drainage, increased warmth or erythema to the foot. 4/21; Patient presents for 1 week follow-up. He has been using silver alginate every other day along with a surgical shoe. He denies drainage, increased warmth or erythema to the foot. 4/28; patient presents for 1 week follow-up. He has been using silver alginate every other day along with his surgical shoe. He has try to aggressively offload the area in the past week. At this time he would like to continue with current therapy as he is pleased with the progress thus far. He would like to only do a contact cast if this were to worsen. He states that he has 5 more days off before he has to go to  work. And will be trying to offload it aggressively in the next week as well. He denies drainage, increased warmth or erythema to the foot. 5/5; patient presents for 1 week follow-up. He started back at work and has been trying to offload the best he can. He continues to  use silver alginate with his surgical shoe. He has no new issues or complaints today. He denies signs of infection. 5/12; patient presents for 1 week follow-up. He has been using silver alginate every other day. He tries to offload this area the best he can despite work and having to be on his feet. He has no issues or complaints today. He denies any signs of infection. 5/26; patient presents for 2-week follow-up. He has been using silver alginate to the wound. He denies a signs of infection. He has no issues or complaints today. He had ABIs with TBI's with an abnormal TBI on the right where the wound is. He has an appointment with vein and vascular on 6/6. 6/2; patient presents for 1 week follow-up. He has been using silver alginate to the wound. He has no issues today. He has an appointment with vein and vascular on 6/6. We discussed potentially doing the cast and he would like to go ahead and try this. 6/6; patient presents for 1 week follow-up. He has been using silver alginate to the wound. He had an appointment with vein and vascular today to discuss ABI results. He states he would like to defer the arteriogram recommended for now and try a total contact cast to see if he has any improvement with wound healing. He denies signs of infection. He reports no pain 6/9; patient presents for obligatory cast change. He states that the cast has rubbed against 1 area on his shin and a couple areas on his toes. Other than that he is tolerated the cast well. He denies any pain. He denies signs of infection. 6/16; this patient I do not usually see he has an area over the first metatarsal head he has been using silver alginate under a total contact cast and the wound seems to be making good progress. He arrives today after the cast was taken off his wife noticed and I her intake nurse 3 subcutaneous blisters in the medial foot just beyond the first met head. These had a purulent look to them. I opened 1 with  a needle and cultured it. There is no tenderness however no erythema. He does not have a skin condition does not have a history of problems on his feet or hands. He apparently was offered an angiogram by vein and vascular but did not proceed with this assessment. No claudication symptoms were obtainable Objective Constitutional Patient is hypotensive. However appears well. Pulse regular and within target range for patient.[name] Respirations regular, non-labored and within target range.. Temperature is normal and within the target range for the patient.[name] Appears in no distress. Vitals Time Taken: 8:59 AM, Height: 70 in, Weight: 225 lbs, BMI: 32.3, Temperature: 97.8 F, Pulse: 76 bpm, Respiratory Rate: 16 breaths/min, Blood Pressure: 93/64 mmHg, Capillary Blood Glucose: 152 mg/dl. General Notes: Wound exam; right foot Metatarsal head I removed some callus thick skin and debris on the wound surface this appears to be smaller and really looks quite healthy. Nice granulation present. No evidence of infection in this area HOWEVER distally to the met head and the medial foot there are 3 what appear to be blistered areas. I open 1 of these there is some  whitish fluid not overtly purulent but I obtained this for culture. Not overtly infected Integumentary (Hair, Skin) Wound #4 status is Open. Original cause of wound was Blister. The date acquired was: 05/07/2020. The wound has been in treatment 10 weeks. The wound is located on the Right Metatarsal head first. The wound measures 0.1cm length x 0.1cm width x 0.1cm depth; 0.008cm^2 area and 0.001cm^3 volume. There is Fat Layer (Subcutaneous Tissue) exposed. There is no tunneling or undermining noted. There is a medium amount of serosanguineous drainage noted. The wound margin is well defined and not attached to the wound base. There is large (67-100%) pink, pale granulation within the wound bed. There is no necrotic tissue within the wound bed. Assessment Active  Problems ICD-10 Type 2 diabetes mellitus with foot ulcer Type 2 diabetes mellitus with diabetic neuropathy, unspecified Atherosclerotic heart disease of native coronary artery without angina pectoris Essential (primary) hypertension Procedures Wound #4 Pre-procedure diagnosis of Wound #4 is a Diabetic Wound/Ulcer of the Lower Extremity located on the Right Metatarsal head first .Severity of Tissue Pre Debridement is: Fat layer exposed. There was a Excisional Skin/Subcutaneous Tissue Debridement with a total area of 0.04 sq cm performed by Ricard Dillon., MD. With the following instrument(s): Curette to remove Viable and Non-Viable tissue/material. Material removed includes Callus, Subcutaneous Tissue, Skin: Dermis, Skin: Epidermis, and Fibrin/Exudate after achieving pain control using Lidocaine 4% Topical Solution. A time out was conducted at 09:40, prior to the start of the procedure. A Minimum amount of bleeding was controlled with Pressure. The procedure was tolerated well with a pain level of 0 throughout and a pain level of 0 following the procedure. Post Debridement Measurements: 0.2cm length x 0.2cm width x 0.1cm depth; 0.003cm^3 volume. Character of Wound/Ulcer Post Debridement is improved. Severity of Tissue Post Debridement is: Fat layer exposed. Post procedure Diagnosis Wound #4: Same as Pre-Procedure Pre-procedure diagnosis of Wound #4 is a Diabetic Wound/Ulcer of the Lower Extremity located on the Right Metatarsal head first . There was a T Contact otal Cast Procedure by Ricard Dillon., MD. Post procedure Diagnosis Wound #4: Same as Pre-Procedure Plan Follow-up Appointments: Return Appointment in 1 week. - Dr. Heber [place]- ***Extra time 60 minutes for cast change*** Bathing/ Shower/ Hygiene: May shower and wash wound with soap and water. - with dressing changes only Off-Loading: T Contact Cast to Right Lower Extremity - ***Please pad 2nd,3rd, and 4th toe along with just  below the right knee.**** otal Non Wound Condition: Protect area with: - below wound area cover with gauze for protection. ***Patient to pick up oral antibiotics at pharmacy.**** Laboratory ordered were: Areobic culture-specimen not specified - culture of right foot The following medication(s) was prescribed: doxycycline monohydrate oral 100 mg capsule 1 capsule oral bid for 7 days for wound infection starting 09/19/2020 WOUND #4: - Metatarsal head first Wound Laterality: Right Cleanser: Soap and Water 1 x Per Week/ Discharge Instructions: May shower and wash wound with dial antibacterial soap and water prior to dressing change. Peri-Wound Care: Skin Prep 1 x Per Week/ Discharge Instructions: Use skin prep as directed Prim Dressing: KerraCel Ag Gelling Fiber Dressing, 2x2 in (silver alginate) 1 x Per Week/ ary Discharge Instructions: Apply silver alginate to wound bed as instructed Secondary Dressing: Woven Gauze Sponges 2x2 in 1 x Per Week/ Discharge Instructions: Apply over primary dressing as directed. Secondary Dressing: Drawtex 4x4 in 1 x Per Week/ Discharge Instructions: Apply over primary dressing as directed. Secured With: 57M Medipore H Public affairs consultant Surgical  T 4 x 2 (in/yd) 1 x Per Week/ ape Discharge Instructions: Secure dressing with tape as directed. 1. We redressed the wound with silver alginate the same as we have been doing 2. The areas on the plantar foot were of uncertain etiology. I opened 1 of these with a needle and obtain fluid for culture. This could be cysts or I suppose abscesses. I was concerned about putting the cast on but also concerned about leaving it off and having regression of the wound that is making nice progress. I gave him a prescription for doxycycline pending the culture and put the cast back on. He is relatively insensate also adding to the worry 3. We repeated his blood pressure 105/69 Electronic Signature(s) Signed: 09/20/2020 10:33:35 AM By: Linton Ham MD Entered By: Linton Ham on 09/19/2020 10:19:22 -------------------------------------------------------------------------------- Total Contact Cast Details Patient Name: Date of Service: [name]. 09/19/2020 8:45 A M Medical Record Number: [id number] Patient Account Number: [id number] Date of Birth/Sex: Treating RN: [birth year] (70 y.o. Hessie Diener Primary Care Provider: Allyn Kenner Other Clinician: Referring Provider: Treating Provider/Extender: Adolm Joseph in Treatment: 76 T Contact Cast Applied for Wound Assessment: otal Wound #4 Right Metatarsal head first Performed By: Physician Ricard Dillon., MD Post Procedure Diagnosis Same as Pre-procedure Electronic Signature(s) Signed: 09/20/2020 10:33:35 AM By: Linton Ham MD Entered By: Linton Ham on 09/19/2020 10:14:15 -------------------------------------------------------------------------------- SuperBill Details Patient Name: Date of Service: [name]. 09/19/2020 Medical Record Number: [id number] Patient Account Number: [id number] Date of Birth/Sex: Treating RN: 28-Jul-1950 (69 y.o. Hessie Diener Primary Care Provider: Allyn Kenner Other Clinician: Referring Provider: Treating Provider/Extender: Adolm Joseph in Treatment: 10 Diagnosis Coding ICD-10 Codes Code Description [phone] Type 2 diabetes mellitus with foot ulcer E11.40 Type 2 diabetes mellitus with diabetic neuropathy, unspecified I25.10 Atherosclerotic heart disease of native coronary artery without angina pectoris I10 Essential (primary) hypertension Facility Procedures CPT4 Code: [id number] Description: 59563 - DEB SUBQ TISSUE 20 SQ CM/< ICD-10 Diagnosis Description E11.621 Type 2 diabetes mellitus with foot ulcer Modifier: Quantity: 1 Physician Procedures : CPT4 Code Description Modifier [id number] - WC PHYS SUBQ TISS 20 SQ CM ICD-10 Diagnosis Description E11.621 Type  2 diabetes mellitus with foot ulcer Quantity: 1 Electronic Signature(s) Signed: 09/20/2020 10:33:35 AM By: Linton Ham MD Entered By: Linton Ham on 09/19/2020 10:20:03

## 2020-09-20 NOTE — Progress Notes (Signed)
[name], [name] ([id number]) Visit Report for 09/19/2020 Arrival Information Details Patient Name: Date of Service: CLAIR, ALFIERI. 09/19/2020 8:45 A M Medical Record Number: [id number] Patient Account Number: [id number] Date of Birth/Sex: Treating RN: 06-05-1950 (70 y.o. Hessie Diener Primary Care Kinberly Perris: Allyn Kenner Other Clinician: Referring Chala Gul: Treating Takelia Urieta/Extender: Adolm Joseph in Treatment: 10 Visit Information History Since Last Visit Added or deleted any medications: No Patient Arrived: Ambulatory Any new allergies or adverse reactions: No Arrival Time: 08:58 Had a fall or experienced change in No Accompanied By: wife activities of daily living that may affect Transfer Assistance: None risk of falls: Patient Identification Verified: Yes Signs or symptoms of abuse/neglect since last visito No Secondary Verification Process Completed: Yes Hospitalized since last visit: No Patient Requires Transmission-Based Precautions: No Implantable device outside of the clinic excluding No Patient Has Alerts: Yes cellular tissue based products placed in the center Patient Alerts: Patient on Blood Thinner since last visit: R ABI non compressible Has Dressing in Place as Prescribed: Yes Pain Present Now: No Electronic Signature(s) Signed: 09/19/2020 10:52:16 AM By: Sandre Kitty Entered By: Sandre Kitty on 09/19/2020 08:59:05 -------------------------------------------------------------------------------- Encounter Discharge Information Details Patient Name: Date of Service: [name]. 09/19/2020 8:45 A M Medical Record Number: [id number] Patient Account Number: [id number] Date of Birth/Sex: Treating RN: October 29, 1950 (69 y.o. Hessie Diener Primary Care Magally Vahle: Allyn Kenner Other Clinician: Referring Varnika Butz: Treating Fain Francis/Extender: Adolm Joseph in Treatment: 10 Encounter Discharge Information Items Post  Procedure Vitals Discharge Condition: Stable Temperature (F): 97.8 Ambulatory Status: Ambulatory Pulse (bpm): 76 Discharge Destination: Home Respiratory Rate (breaths/min): 18 Transportation: Private Auto Blood Pressure (mmHg): 105/69 Accompanied By: wife Schedule Follow-up Appointment: Yes Clinical Summary of Care: Notes Rechecked bp from intake. Electronic Signature(s) Signed: 09/19/2020 5:31:56 PM By: Deon Pilling Entered By: Deon Pilling on 09/19/2020 10:19:08 -------------------------------------------------------------------------------- Lower Extremity Assessment Details Patient Name: Date of Service: JOHNOTHAN, BASCOMB. 09/19/2020 8:45 A M Medical Record Number: [id number] Patient Account Number: [id number] Date of Birth/Sex: Treating RN: [birth year] (70 y.o. Marcheta Grammes Primary Care Brendan Gadson: Allyn Kenner Other Clinician: Referring Adalbert Alberto: Treating Sahil Milner/Extender: Adolm Joseph in Treatment: 10 Edema Assessment Assessed: Shirlyn Goltz: No] Patrice Paradise: Yes] Edema: [Left: N] [Right: o] Calf Left: Right: Point of Measurement: 32 cm From Medial Instep 41 cm Ankle Left: Right: Point of Measurement: 11 cm From Medial Instep 26 cm Vascular Assessment Pulses: Dorsalis Pedis Palpable: [Right:Yes] Electronic Signature(s) Signed: 09/19/2020 6:07:06 PM By: Lorrin Jackson Entered By: Lorrin Jackson on 09/19/2020 09:25:03 -------------------------------------------------------------------------------- Multi Wound Chart Details Patient Name: Date of Service: [name]. 09/19/2020 8:45 A M Medical Record Number: [id number] Patient Account Number: [id number] Date of Birth/Sex: Treating RN: 1950-11-24 (69 y.o. Hessie Diener Primary Care Ephrem Carrick: Allyn Kenner Other Clinician: Referring Zoei Amison: Treating Manly Nestle/Extender: Adolm Joseph in Treatment: 10 Vital Signs Height(in): 70 Capillary Blood Glucose(mg/dl):  152 Weight(lbs): 225 Pulse(bpm): 70 Body Mass Index(BMI): 32 Blood Pressure(mmHg): 93/64 Temperature(F): 97.8 Respiratory Rate(breaths/min): 16 Photos: [4:No Photos Right Metatarsal head first] [N/A:N/A N/A] Wound Location: [4:Blister] [N/A:N/A] Wounding Event: [4:Diabetic Wound/Ulcer of the Lower] [N/A:N/A] Primary Etiology: [4:Extremity Cataracts, Sleep Apnea, Coronary] [N/A:N/A] Comorbid History: [4:Artery Disease, Hypertension, Peripheral Arterial Disease, Type II Diabetes, Neuropathy 05/07/2020] [N/A:N/A] Date Acquired: [4:10] [N/A:N/A] Weeks of Treatment: [4:Open] [N/A:N/A] Wound Status: [4:0.1x0.1x0.1] [N/A:N/A] Measurements L x W x D (cm) [4:0.008] [N/A:N/A] A (cm) : rea [4:0.001] [N/A:N/A] Volume (cm) : [4:87.30%] [N/A:N/A] % Reduction in A rea: [  4:94.70%] [N/A:N/A] % Reduction in Volume: [4:Grade 2] [N/A:N/A] Classification: [4:Medium] [N/A:N/A] Exudate A mount: [4:Serosanguineous] [N/A:N/A] Exudate Type: [4:red, brown] [N/A:N/A] Exudate Color: [4:Well defined, not attached] [N/A:N/A] Wound Margin: [4:Large (67-100%)] [N/A:N/A] Granulation A mount: [4:Pink, Pale] [N/A:N/A] Granulation Quality: [4:None Present (0%)] [N/A:N/A] Necrotic A mount: [4:Fat Layer (Subcutaneous Tissue): Yes N/A] Exposed Structures: [4:Fascia: No Tendon: No Muscle: No Joint: No Bone: No Medium (34-66%)] [N/A:N/A] Epithelialization: [4:Debridement - Excisional] [N/A:N/A] Debridement: Pre-procedure Verification/Time Out 09:40 [N/A:N/A] Taken: [4:Lidocaine 4% Topical Solution] [N/A:N/A] Pain Control: [4:Callus, Subcutaneous] [N/A:N/A] Tissue Debrided: [4:Skin/Subcutaneous Tissue] [N/A:N/A] Level: [4:0.04] [N/A:N/A] Debridement A (sq cm): [4:rea Curette] [N/A:N/A] Instrument: [4:Minimum] [N/A:N/A] Bleeding: [4:Pressure] [N/A:N/A] Hemostasis A chieved: [4:0] [N/A:N/A] Procedural Pain: [4:0] [N/A:N/A] Post Procedural Pain: [4:Procedure was tolerated well] [N/A:N/A] Debridement Treatment  Response: [4:0.2x0.2x0.1] [N/A:N/A] Post Debridement Measurements L x W x D (cm) [4:0.003] [N/A:N/A] Post Debridement Volume: (cm) [4:Debridement] [N/A:N/A] Procedures Performed: [4:T Contact Cast otal] Treatment Notes Electronic Signature(s) Signed: 09/19/2020 5:31:56 PM By: Deon Pilling Signed: 09/20/2020 10:33:35 AM By: Linton Ham MD Entered By: Linton Ham on 09/19/2020 10:13:53 -------------------------------------------------------------------------------- Multi-Disciplinary Care Plan Details Patient Name: Date of Service: [name]. 09/19/2020 8:45 A M Medical Record Number: [id number] Patient Account Number: [id number] Date of Birth/Sex: Treating RN: [birth year] (70 y.o. Hessie Diener Primary Care Bhavin Monjaraz: Allyn Kenner Other Clinician: Referring Sydny Schnitzler: Treating Llewellyn Choplin/Extender: Adolm Joseph in Treatment: 10 Multidisciplinary Care Plan reviewed with physician Active Inactive Nutrition Nursing Diagnoses: Impaired glucose control: actual or potential Potential for alteratiion in Nutrition/Potential for imbalanced nutrition Goals: Patient/caregiver agrees to and verbalizes understanding of need to use nutritional supplements and/or vitamins as prescribed Date Initiated: 07/11/2020 Date Inactivated: 08/01/2020 Target Resolution Date: 08/09/2020 Goal Status: Met Patient/caregiver will maintain therapeutic glucose control Date Initiated: 07/11/2020 Target Resolution Date: 10/03/2020 Goal Status: Active Interventions: Assess HgA1c results as ordered upon admission and as needed Assess patient nutrition upon admission and as needed per policy Provide education on elevated blood sugars and impact on wound healing Provide education on nutrition Treatment Activities: Education provided on Nutrition : 09/12/2020 Notes: Electronic Signature(s) Signed: 09/19/2020 5:31:56 PM By: Deon Pilling Entered By: Deon Pilling on 09/19/2020  09:30:04 -------------------------------------------------------------------------------- Pain Assessment Details Patient Name: Date of Service: [name]. 09/19/2020 8:45 A M Medical Record Number: [id number] Patient Account Number: [id number] Date of Birth/Sex: Treating RN: Mar 15, 1951 (70 y.o. Hessie Diener Primary Care Mackensey Bolte: Allyn Kenner Other Clinician: Referring Talana Slatten: Treating Cory Kitt/Extender: Adolm Joseph in Treatment: 10 Active Problems Location of Pain Severity and Description of Pain Patient Has Paino No Site Locations Pain Management and Medication Current Pain Management: Electronic Signature(s) Signed: 09/19/2020 10:52:16 AM By: Sandre Kitty Signed: 09/19/2020 5:31:56 PM By: Deon Pilling Entered By: Sandre Kitty on 09/19/2020 08:59:38 -------------------------------------------------------------------------------- Patient/Caregiver Education Details Patient Name: Date of Service: Crosby, RO BERT W. 6/16/2022andnbsp8:45 [place] Record Number: [id number] Patient Account Number: [id number] Date of Birth/Gender: Treating RN: 04/11/1950 (70 y.o. Hessie Diener Primary Care Physician: Allyn Kenner Other Clinician: Referring Physician: Treating Physician/Extender: Adolm Joseph in Treatment: 10 Education Assessment Education Provided To: Patient Education Topics Provided Elevated Blood Sugar/ Impact on Healing: Handouts: Elevated Blood Sugars: How Do They Affect Wound Healing Methods: Explain/Verbal Responses: Reinforcements needed Electronic Signature(s) Signed: 09/19/2020 5:31:56 PM By: Deon Pilling Entered By: Deon Pilling on 09/19/2020 09:32:32 -------------------------------------------------------------------------------- Wound Assessment Details Patient Name: Date of Service: [name]. 09/19/2020 8:45 A M Medical Record Number: [id number] Patient Account Number:  [id number] Date of Birth/Sex: Treating RN: 13-Jul-1950 (70 y.o. Hessie Diener Primary Care Tammie Yanda: Allyn Kenner Other Clinician: Referring Guido Comp: Treating Brooke Payes/Extender: Adolm Joseph in Treatment: 10 Wound Status Wound Number: 4 Primary Diabetic Wound/Ulcer of the Lower Extremity Etiology: Wound Location: Right Metatarsal head first Wound Open Wounding Event: Blister Status: Date Acquired: 05/07/2020 Comorbid Cataracts, Sleep Apnea, Coronary Artery Disease, Hypertension, Weeks Of Treatment: 10 History: Peripheral Arterial Disease, Type II Diabetes, Neuropathy Clustered Wound: No Photos Wound Measurements Length: (cm) 0.1 Width: (cm) 0.1 Depth: (cm) 0.1 Area: (cm) 0.008 Volume: (cm) 0.001 % Reduction in Area: 87.3% % Reduction in Volume: 94.7% Epithelialization: Medium (34-66%) Tunneling: No Undermining: No Wound Description Classification: Grade 2 Wound Margin: Well defined, not attached Exudate Amount: Medium Exudate Type: Serosanguineous Exudate Color: red, brown Foul Odor After Cleansing: No Slough/Fibrino No Wound Bed Granulation Amount: Large (67-100%) Exposed Structure Granulation Quality: Pink, Pale Fascia Exposed: No Necrotic Amount: None Present (0%) Fat Layer (Subcutaneous Tissue) Exposed: Yes Tendon Exposed: No Muscle Exposed: No Joint Exposed: No Bone Exposed: No Treatment Notes Wound #4 (Metatarsal head first) Wound Laterality: Right Cleanser Soap and Water Discharge Instruction: May shower and wash wound with dial antibacterial soap and water prior to dressing change. Peri-Wound Care Skin Prep Discharge Instruction: Use skin prep as directed Topical Primary Dressing KerraCel Ag Gelling Fiber Dressing, 2x2 in (silver alginate) Discharge Instruction: Apply silver alginate to wound bed as instructed Secondary Dressing Drawtex 4x4 in Discharge Instruction: Apply over primary dressing as directed. Woven Gauze  Sponges 2x2 in Discharge Instruction: Apply over primary dressing as directed. Secured With 4M Medipore H Soft Cloth Surgical T 4 x 2 (in/yd) ape Discharge Instruction: Secure dressing with tape as directed. Compression Wrap Compression Stockings Add-Ons Electronic Signature(s) Signed: 09/19/2020 5:21:02 PM By: Sandre Kitty Signed: 09/19/2020 5:31:56 PM By: Deon Pilling Entered By: Sandre Kitty on 09/19/2020 17:10:33 -------------------------------------------------------------------------------- Vitals Details Patient Name: Date of Service: [name]. 09/19/2020 8:45 A M Medical Record Number: [id number] Patient Account Number: [id number] Date of Birth/Sex: Treating RN: 06-18-1950 (70 y.o. Hessie Diener Primary Care Kiylee Thoreson: Allyn Kenner Other Clinician: Referring Kainoa Swoboda: Treating Jakevion Arney/Extender: Adolm Joseph in Treatment: 10 Vital Signs Time Taken: 08:59 Temperature (F): 97.8 Height (in): 70 Pulse (bpm): 76 Weight (lbs): 225 Respiratory Rate (breaths/min): 16 Body Mass Index (BMI): 32.3 Blood Pressure (mmHg): 93/64 Capillary Blood Glucose (mg/dl): 152 Reference Range: 80 - 120 mg / dl Electronic Signature(s) Signed: 09/19/2020 10:52:16 AM By: Sandre Kitty Entered By: Sandre Kitty on 09/19/2020 08:59:32

## 2020-09-22 LAB — AEROBIC CULTURE W GRAM STAIN (SUPERFICIAL SPECIMEN)
Culture: NORMAL
Gram Stain: NONE SEEN

## 2020-09-26 ENCOUNTER — Encounter (HOSPITAL_BASED_OUTPATIENT_CLINIC_OR_DEPARTMENT_OTHER): Payer: Medicare HMO | Admitting: Internal Medicine

## 2020-09-27 ENCOUNTER — Other Ambulatory Visit: Payer: Self-pay

## 2020-09-27 ENCOUNTER — Encounter (HOSPITAL_BASED_OUTPATIENT_CLINIC_OR_DEPARTMENT_OTHER): Payer: Medicare HMO | Admitting: Internal Medicine

## 2020-09-27 DIAGNOSIS — I2510 Atherosclerotic heart disease of native coronary artery without angina pectoris: Secondary | ICD-10-CM | POA: Diagnosis not present

## 2020-09-27 DIAGNOSIS — E11621 Type 2 diabetes mellitus with foot ulcer: Secondary | ICD-10-CM | POA: Diagnosis not present

## 2020-09-27 DIAGNOSIS — Z794 Long term (current) use of insulin: Secondary | ICD-10-CM | POA: Diagnosis not present

## 2020-09-27 DIAGNOSIS — I10 Essential (primary) hypertension: Secondary | ICD-10-CM

## 2020-09-27 DIAGNOSIS — E1140 Type 2 diabetes mellitus with diabetic neuropathy, unspecified: Secondary | ICD-10-CM | POA: Diagnosis not present

## 2020-09-27 DIAGNOSIS — E1151 Type 2 diabetes mellitus with diabetic peripheral angiopathy without gangrene: Secondary | ICD-10-CM | POA: Diagnosis not present

## 2020-09-27 NOTE — Progress Notes (Addendum)
[name], [name] ([id number]) Visit Report for 09/27/2020 Chief Complaint Document Details Patient Name: Date of Service: [name], [name] 09/27/2020 8:15 A M Medical Record Number: [id number] Patient Account Number: [id number] Date of Birth/Sex: Treating RN: Sep 23, 1950 (70 y.o. Ernestene Mention Primary Care Provider: Allyn Kenner Other Clinician: Referring Provider: Treating Provider/Extender: Dierdre Harness in Treatment: 11 Information Obtained from: Patient Chief Complaint the patient arrives for evaluation of a wound on his first right metatarsal head, plantar aspect Electronic Signature(s) Signed: 09/27/2020 9:26:18 AM By: Kalman Shan DO Entered By: Kalman Shan on 09/27/2020 09:20:08 -------------------------------------------------------------------------------- HPI Details Patient Name: Date of Service: [name]. 09/27/2020 8:15 A M Medical Record Number: [id number] Patient Account Number: [id number] Date of Birth/Sex: Treating RN: Jun 11, 1950 (70 y.o. Ernestene Mention Primary Care Provider: Allyn Kenner Other Clinician: Referring Provider: Treating Provider/Extender: Dierdre Harness in Treatment: 11 History of Present Illness HPI Description: 07/12/19 [name] is a 70 year old male with a past medical history of insulin-dependent type 2 diabetes, essential hypertension and coronary artery disease status post CABG in 2009. He has been seen in our clinic on three separate occasions for diabetic foot wounds. Last seen in 2018 for a left fifth metatarsal head diabetic foot ulcer. This was treated with silver alginate and offloading. Other previous visits were in 2009 and 2015. Patient states that 2 months ago he noticed a blister to the bottom of his foot. He reports using a foot pedal and applying constant pressure to this area for 3 hours a day. 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 This project has  ended but still requires to be on his feet for work. He has noticed minimal drainage to the wound bed and denies any pain, swelling or erythema to the right foot. 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 In the past 2 months he was seen at [facility] in [place], [place] and [facility] for this issue. He states [employer] obtained an x-ray recently and reports it showed no significant findings. He was started on a cream for which he can not remember the name. He states he uses this with daily dressing changes. He was not satisfied with his care at either location and has since decided to follow-up with [place]. Overall he feels well 4/14; Patient presents for his 1 week follow-up. He reports no issues in the past week. He has been using his surgical shoe and has been aggressive with offloading. He is pleased with the progress of his wound healing. He denies any drainage, increased warmth or erythema to the foot. 4/21; Patient presents for 1 week follow-up. He has been using silver alginate every other day along with a surgical shoe. He denies drainage, increased warmth or erythema to the foot. 4/28; patient presents for 1 week follow-up. He has been using silver alginate every other day along with his surgical shoe. He has try to aggressively offload the area in the past week. At this time he would like to continue with current therapy as he is pleased with the progress thus far. He would like to only do a contact cast if this were to worsen. He states that he has 5 more days off before he has to go to work. And will be trying to offload it aggressively in the next week as well. He denies drainage, increased warmth or erythema to  the foot. 5/5; patient presents for 1 week follow-up. He started back at work and has been trying to offload the best he can. He continues to use silver alginate with his surgical shoe. He has  no new issues or complaints today. He denies signs of infection. 5/12; patient presents for 1 week follow-up. He has been using silver alginate every other day. He tries to offload this area the best he can despite work and having to be on his feet. He has no issues or complaints today. He denies any signs of infection. 5/26; patient presents for 2-week follow-up. He has been using silver alginate to the wound. He denies a signs of infection. He has no issues or complaints today. He had ABIs with TBI's with an abnormal TBI on the right where the wound is. He has an appointment with vein and vascular on 6/6. 6/2; patient presents for 1 week follow-up. He has been using silver alginate to the wound. He has no issues today. He has an appointment with vein and vascular on 6/6. We discussed potentially doing the cast and he would like to go ahead and try this. 6/6; patient presents for 1 week follow-up. He has been using silver alginate to the wound. He had an appointment with vein and vascular today to discuss ABI results. He states he would like to defer the arteriogram recommended for now and try a total contact cast to see if he has any improvement with wound healing. He denies signs of infection. He reports no pain 6/9; patient presents for obligatory cast change. He states that the cast has rubbed against 1 area on his shin and a couple areas on his toes. Other than that he is tolerated the cast well. He denies any pain. He denies signs of infection. 6/16; this patient I do not usually see he has an area over the first metatarsal head he has been using silver alginate under a total contact cast and the wound seems to be making good progress. He arrives today after the cast was taken off his wife noticed and I her intake nurse 3 subcutaneous blisters in the medial foot just beyond the first met head. These had a purulent look to them. I opened 1 with a needle and cultured it. There is no tenderness  however no erythema. He does not have a skin condition does not have a history of problems on his feet or hands. He apparently was offered an angiogram by vein and vascular but did not proceed with this assessment. No claudication symptoms were obtainable 6/24; patient presents for 1 week follow-up. He has tolerated the cast well. He has no issues or complaints today. He denies signs of infection. Electronic Signature(s) Signed: 09/27/2020 9:26:18 AM By: Kalman Shan DO Entered By: Kalman Shan on 09/27/2020 09:20:42 -------------------------------------------------------------------------------- Physical Exam Details Patient Name: Date of Service: [name]. 09/27/2020 8:15 A M Medical Record Number: [id number] Patient Account Number: [id number] Date of Birth/Sex: Treating RN: 12-26-1950 (70 y.o. Ernestene Mention Primary Care Provider: Allyn Kenner Other Clinician: Referring Provider: Treating Provider/Extender: Dierdre Harness in Treatment: 11 Constitutional respirations regular, non-labored and within target range for patient.[name] Psychiatric pleasant and cooperative. Notes Right foot: First metatarsal head with closed wound. Previous blisters noted at last clinic visit are healed. No signs of infection. Electronic Signature(s) Signed: 09/27/2020 9:26:18 AM By: Kalman Shan DO Entered By: Kalman Shan on 09/27/2020 09:21:59 -------------------------------------------------------------------------------- Physician Orders Details Patient Name: Date of Service: Jeremy Johann,  RO BERT W. 09/27/2020 8:15 A M Medical Record Number: [id number] Patient Account Number: [id number] Date of Birth/Sex: Treating RN: 18-Feb-1951 (70 y.o. Ernestene Mention Primary Care Provider: Allyn Kenner Other Clinician: Referring Provider: Treating Provider/Extender: Dierdre Harness in Treatment: 11 Verbal / Phone Orders: No Diagnosis Coding ICD-10  Coding Code Description E11.621 Type 2 diabetes mellitus with foot ulcer E11.40 Type 2 diabetes mellitus with diabetic neuropathy, unspecified I25.10 Atherosclerotic heart disease of native coronary artery without angina pectoris I10 Essential (primary) hypertension Discharge From [facility] Services Discharge from [place] Bathing/ Shower/ Hygiene May shower and wash wound with soap and water. Non Wound Condition Protect area with: - foam callous cushion Electronic Signature(s) Signed: 09/27/2020 9:26:18 AM By: Kalman Shan DO Entered By: Kalman Shan on 09/27/2020 09:22:13 -------------------------------------------------------------------------------- Problem List Details Patient Name: Date of Service: [name]. 09/27/2020 8:15 A M Medical Record Number: [id number] Patient Account Number: [id number] Date of Birth/Sex: Treating RN: 1951-01-25 (70 y.o. Ernestene Mention Primary Care Provider: Allyn Kenner Other Clinician: Referring Provider: Treating Provider/Extender: Dierdre Harness in Treatment: 11 Active Problems ICD-10 Encounter Code Description Active Date MDM Diagnosis E11.621 Type 2 diabetes mellitus with foot ulcer 07/11/2020 No Yes E11.40 Type 2 diabetes mellitus with diabetic neuropathy, unspecified 07/11/2020 No Yes I25.10 Atherosclerotic heart disease of native coronary artery without angina pectoris 07/11/2020 No Yes I10 Essential (primary) hypertension 07/11/2020 No Yes Inactive Problems Resolved Problems Electronic Signature(s) Signed: 09/27/2020 9:26:18 AM By: Kalman Shan DO Entered By: Kalman Shan on 09/27/2020 09:19:51 -------------------------------------------------------------------------------- Progress Note Details Patient Name: Date of Service: [name]. 09/27/2020 8:15 A M Medical Record Number: [id number] Patient Account Number: [id number] Date of Birth/Sex: Treating RN: 08/10/1950 (70 y.o. Ernestene Mention Primary Care Provider: Allyn Kenner Other Clinician: Referring Provider: Treating Provider/Extender: Dierdre Harness in Treatment: 11 Subjective Chief Complaint Information obtained from Patient the patient arrives for evaluation of a wound on his first right metatarsal head, plantar aspect History of Present Illness (HPI) 07/12/19 [name] is a 70 year old male with a past medical history of insulin-dependent type 2 diabetes, essential hypertension and coronary artery disease status post CABG in 2009. He has been seen in our clinic on three separate occasions for diabetic foot wounds. Last seen in 2018 for a left fifth metatarsal head diabetic foot ulcer. This was treated with silver alginate and offloading. Other previous visits were in 2009 and 2015. Patient states that 2 months ago he noticed a blister to the bottom of his foot. He reports using a foot pedal and applying constant pressure to this area for 3 hours a day. 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 He has noticed minimal drainage to the wound bed and denies any pain, swelling or erythema to the right foot. 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 In the past 2 months he was seen at [facility] in [place], [place] and [facility] for this issue. He states [employer] obtained an x-ray recently and reports it showed no significant findings. He was started on a cream for which he can not remember the name. He states he uses this with daily dressing changes. He was not satisfied with his care at either location and has since decided to follow-up with [place]. Overall he  feels well 4/14; Patient presents for his 1 week follow-up. He reports no issues in the past week. He has been using his surgical shoe and has been aggressive  with offloading. He is pleased with the progress of his wound healing. He denies any drainage, increased warmth or erythema to the foot. 4/21; Patient presents for 1 week follow-up. He has been using silver alginate every other day along with a surgical shoe. He denies drainage, increased warmth or erythema to the foot. 4/28; patient presents for 1 week follow-up. He has been using silver alginate every other day along with his surgical shoe. He has try to aggressively offload the area in the past week. At this time he would like to continue with current therapy as he is pleased with the progress thus far. He would like to only do a contact cast if this were to worsen. He states that he has 5 more days off before he has to go to work. And will be trying to offload it aggressively in the next week as well. He denies drainage, increased warmth or erythema to the foot. 5/5; patient presents for 1 week follow-up. He started back at work and has been trying to offload the best he can. He continues to use silver alginate with his surgical shoe. He has no new issues or complaints today. He denies signs of infection. 5/12; patient presents for 1 week follow-up. He has been using silver alginate every other day. He tries to offload this area the best he can despite work and having to be on his feet. He has no issues or complaints today. He denies any signs of infection. 5/26; patient presents for 2-week follow-up. He has been using silver alginate to the wound. He denies a signs of infection. He has no issues or complaints today. He had ABIs with TBI's with an abnormal TBI on the right where the wound is. He has an appointment with vein and vascular on 6/6. 6/2; patient presents for 1 week follow-up. He has been using silver alginate to the wound. He has no issues today. He has an appointment with vein and vascular on 6/6. We discussed potentially doing the cast and he would like to go ahead and try  this. 6/6; patient presents for 1 week follow-up. He has been using silver alginate to the wound. He had an appointment with vein and vascular today to discuss ABI results. He states he would like to defer the arteriogram recommended for now and try a total contact cast to see if he has any improvement with wound healing. He denies signs of infection. He reports no pain 6/9; patient presents for obligatory cast change. He states that the cast has rubbed against 1 area on his shin and a couple areas on his toes. Other than that he is tolerated the cast well. He denies any pain. He denies signs of infection. 6/16; this patient I do not usually see he has an area over the first metatarsal head he has been using silver alginate under a total contact cast and the wound seems to be making good progress. He arrives today after the cast was taken off his wife noticed and I her intake nurse 3 subcutaneous blisters in the medial foot just beyond the first met head. These had a purulent look to them. I opened 1 with a needle and cultured it. There is no tenderness however no erythema. He does not have a skin condition does not have a history of  problems on his feet or hands. He apparently was offered an angiogram by vein and vascular but did not proceed with this assessment. No claudication symptoms were obtainable 6/24; patient presents for 1 week follow-up. He has tolerated the cast well. He has no issues or complaints today. He denies signs of infection. Patient History Information obtained from Patient. Family History Cancer - Mother, Diabetes - Father,Siblings, Heart Disease - Father,Siblings, Hypertension - Mother,Father,Siblings, No family history of Hereditary Spherocytosis, Kidney Disease, Lung Disease, Seizures, Stroke, Thyroid Problems, Tuberculosis. Social History Never smoker, Marital Status - Married, Alcohol Use - Never, Drug Use - No History, Caffeine Use - Daily - coffee. Medical  History Eyes Patient has history of Cataracts - removed Denies history of Glaucoma, Optic Neuritis Ear/Nose/Mouth/Throat Denies history of Chronic sinus problems/congestion, Middle ear problems Hematologic/Lymphatic Denies history of Anemia, Hemophilia, Human Immunodeficiency Virus, Lymphedema, Sickle Cell Disease Respiratory Patient has history of Sleep Apnea Denies history of Aspiration, Asthma, Chronic Obstructive Pulmonary Disease (COPD), Pneumothorax, Tuberculosis Cardiovascular Patient has history of Coronary Artery Disease - CABG x5, Hypertension, Peripheral Arterial Disease Denies history of Angina, Arrhythmia, Congestive Heart Failure, Deep Vein Thrombosis, Hypotension, Myocardial Infarction, Peripheral Venous Disease, Phlebitis, Vasculitis Gastrointestinal Denies history of Cirrhosis , Colitis, Crohnoos, Hepatitis A, Hepatitis B, Hepatitis C Endocrine Patient has history of Type II Diabetes Denies history of Type I Diabetes Genitourinary Denies history of End Stage Renal Disease Immunological Denies history of Lupus Erythematosus, Raynaudoos, Scleroderma Integumentary (Skin) Denies history of History of Burn Musculoskeletal Denies history of Gout, Rheumatoid Arthritis, Osteoarthritis, Osteomyelitis Neurologic Patient has history of Neuropathy Denies history of Dementia, Quadriplegia, Paraplegia, Seizure Disorder Oncologic Denies history of Received Chemotherapy, Received Radiation Psychiatric Denies history of Anorexia/bulimia, Confinement Anxiety Hospitalization/Surgery History - coronoary artery bypass. - cardiac cath. - back surgery. - appendectomy. - tonsillectomy. Medical A Surgical History Notes nd Cardiovascular CVA, hyperlipidemia, carotid stenosis Gastrointestinal colon polyps Genitourinary Renal Insufficiency Integumentary (Skin) (L) foot ulcer - Objective Constitutional respirations regular, non-labored and within target range for  patient.. Vitals Time Taken: 8:20 AM, Height: 70 in, Weight: 225 lbs, BMI: 32.3, Temperature: 97.9 F, Pulse: 67 bpm, Respiratory Rate: 18 breaths/min, Blood Pressure: 128/73 mmHg, Pulse Oximetry: 94 %. Psychiatric pleasant and cooperative. General Notes: Right foot: First metatarsal head with closed wound. Previous blisters noted at last clinic visit are healed. No signs of infection. Integumentary (Hair, Skin) Wound #4 status is Open. Original cause of wound was Blister. The date acquired was: 05/07/2020. The wound has been in treatment 11 weeks. The wound is located on the Right Metatarsal head first. The wound measures 0cm length x 0cm width x 0cm depth; 0cm^2 area and 0cm^3 volume. There is no tunneling or undermining noted. There is a medium amount of serosanguineous drainage noted. The wound margin is well defined and not attached to the wound base. There is no granulation within the wound bed. There is no necrotic tissue within the wound bed. Assessment Active Problems ICD-10 Type 2 diabetes mellitus with foot ulcer Type 2 diabetes mellitus with diabetic neuropathy, unspecified Atherosclerotic heart disease of native coronary artery without angina pectoris Essential (primary) hypertension Patient has done very well with the cast and his wound is healed on exam. I gave him the option of doing the cast for 1 more week to help strengthen the tissue or to try without and continue to offload the area well. He would like to try without the cast and see how he does. He can follow-up as needed. Of note patient  had a culture done by Dr. Dellia Nims to a blister site last clinic visit that showed no growth of organisms. Plan Discharge From [facility] Services: Discharge from [place] Bathing/ Shower/ Hygiene: May shower and wash wound with soap and water. Non Wound Condition: Protect area with: - foam callous cushion 1. Discharge from our clinic due to healed wound 2. Continue to aggressively  offload for the next 1 to 2 weeks 3. Follow-up as needed Electronic Signature(s) Signed: 09/27/2020 9:26:18 AM By: Kalman Shan DO Entered By: Kalman Shan on 09/27/2020 09:25:29 -------------------------------------------------------------------------------- HxROS Details Patient Name: Date of Service: [name]. 09/27/2020 8:15 A M Medical Record Number: [id number] Patient Account Number: [id number] Date of Birth/Sex: Treating RN: 06-May-1950 (70 y.o. Ernestene Mention Primary Care Provider: Allyn Kenner Other Clinician: Referring Provider: Treating Provider/Extender: Dierdre Harness in Treatment: 11 Information Obtained From Patient Eyes Medical History: Positive for: Cataracts - removed Negative for: Glaucoma; Optic Neuritis Ear/Nose/Mouth/Throat Medical History: Negative for: Chronic sinus problems/congestion; Middle ear problems Hematologic/Lymphatic Medical History: Negative for: Anemia; Hemophilia; Human Immunodeficiency Virus; Lymphedema; Sickle Cell Disease Respiratory Medical History: Positive for: Sleep Apnea Negative for: Aspiration; Asthma; Chronic Obstructive Pulmonary Disease (COPD); Pneumothorax; Tuberculosis Cardiovascular Medical History: Positive for: Coronary Artery Disease - CABG x5; Hypertension; Peripheral Arterial Disease Negative for: Angina; Arrhythmia; Congestive Heart Failure; Deep Vein Thrombosis; Hypotension; Myocardial Infarction; Peripheral Venous Disease; Phlebitis; Vasculitis Past Medical History Notes: CVA, hyperlipidemia, carotid stenosis Gastrointestinal Medical History: Negative for: Cirrhosis ; Colitis; Crohns; Hepatitis A; Hepatitis B; Hepatitis C Past Medical History Notes: colon polyps Endocrine Medical History: Positive for: Type II Diabetes Negative for: Type I Diabetes Time with diabetes: over 10 years Treated with: Insulin, Oral agents Blood sugar tested every day: Yes Tested : 2-3 times  a day Blood sugar testing results: Breakfast: 160 Genitourinary Medical History: Negative for: End Stage Renal Disease Past Medical History Notes: Renal Insufficiency Immunological Medical History: Negative for: Lupus Erythematosus; Raynauds; Scleroderma Integumentary (Skin) Medical History: Negative for: History of Burn Past Medical History Notes: (L) foot ulcer - Musculoskeletal Medical History: Negative for: Gout; Rheumatoid Arthritis; Osteoarthritis; Osteomyelitis Neurologic Medical History: Positive for: Neuropathy Negative for: Dementia; Quadriplegia; Paraplegia; Seizure Disorder Oncologic Medical History: Negative for: Received Chemotherapy; Received Radiation Psychiatric Medical History: Negative for: Anorexia/bulimia; Confinement Anxiety HBO Extended History Items Eyes: Cataracts Immunizations Pneumococcal Vaccine: Received Pneumococcal Vaccination: No Implantable Devices No devices added Hospitalization / Surgery History Type of Hospitalization/Surgery coronoary artery bypass cardiac cath back surgery appendectomy tonsillectomy Family and Social History Cancer: Yes - Mother; Diabetes: Yes - Father,Siblings; Heart Disease: Yes - Father,Siblings; Hereditary Spherocytosis: No; Hypertension: Yes - Mother,Father,Siblings; Kidney Disease: No; Lung Disease: No; Seizures: No; Stroke: No; Thyroid Problems: No; Tuberculosis: No; Never smoker; Marital Status - Married; Alcohol Use: Never; Drug Use: No History; Caffeine Use: Daily - coffee; Financial Concerns: No; Food, Clothing or Shelter Needs: No; Support System Lacking: No; Transportation Concerns: No Electronic Signature(s) Signed: 09/27/2020 9:26:18 AM By: Kalman Shan DO Signed: 09/27/2020 6:17:32 PM By: Baruch Gouty RN, BSN Entered By: Kalman Shan on 09/27/2020 09:20:49 -------------------------------------------------------------------------------- [place] Details Patient Name: Date of  Service: [name]. 09/27/2020 Medical Record Number: [id number] Patient Account Number: [id number] Date of Birth/Sex: Treating RN: [birth year] (70 y.o. Ernestene Mention Primary Care Provider: Allyn Kenner Other Clinician: Referring Provider: Treating Provider/Extender: Dierdre Harness in Treatment: 11 Diagnosis Coding ICD-10 Codes Code Description E11.621 Type 2 diabetes mellitus with foot ulcer E11.40 Type 2 diabetes mellitus with  diabetic neuropathy, unspecified I25.10 Atherosclerotic heart disease of native coronary artery without angina pectoris I10 Essential (primary) hypertension Facility Procedures CPT4 Code: [id number] Description: 99213 - WOUND CARE VISIT-LEV 3 EST PT Modifier: Quantity: 1 Physician Procedures : CPT4 Code Description Modifier [id number] - WC PHYS LEVEL 3 - EST PT ICD-10 Diagnosis Description E11.621 Type 2 diabetes mellitus with foot ulcer E11.40 Type 2 diabetes mellitus with diabetic neuropathy, unspecified I25.10 Atherosclerotic heart  disease of native coronary artery without angina pectoris I10 Essential (primary) hypertension Quantity: 1 Electronic Signature(s) Signed: 09/27/2020 9:26:18 AM By: Kalman Shan DO Entered By: Kalman Shan on 09/27/2020 09:25:47

## 2020-10-09 DIAGNOSIS — E1165 Type 2 diabetes mellitus with hyperglycemia: Secondary | ICD-10-CM | POA: Diagnosis not present

## 2020-10-09 DIAGNOSIS — E1140 Type 2 diabetes mellitus with diabetic neuropathy, unspecified: Secondary | ICD-10-CM | POA: Diagnosis not present

## 2020-10-09 DIAGNOSIS — I10 Essential (primary) hypertension: Secondary | ICD-10-CM | POA: Diagnosis not present

## 2020-10-09 DIAGNOSIS — I2510 Atherosclerotic heart disease of native coronary artery without angina pectoris: Secondary | ICD-10-CM | POA: Diagnosis not present

## 2020-10-09 DIAGNOSIS — I639 Cerebral infarction, unspecified: Secondary | ICD-10-CM | POA: Diagnosis not present

## 2020-10-09 DIAGNOSIS — E7800 Pure hypercholesterolemia, unspecified: Secondary | ICD-10-CM | POA: Diagnosis not present

## 2020-10-09 DIAGNOSIS — N189 Chronic kidney disease, unspecified: Secondary | ICD-10-CM | POA: Diagnosis not present

## 2020-10-11 DIAGNOSIS — E1165 Type 2 diabetes mellitus with hyperglycemia: Secondary | ICD-10-CM | POA: Diagnosis not present

## 2020-10-24 NOTE — Progress Notes (Signed)
[name] ([id number]) Visit Report for 09/27/2020 Arrival Information Details Patient Name: Date of Service: [name], [name] 09/27/2020 8:15 A M Medical Record Number: [id number] Patient Account Number: [id number] Date of Birth/Sex: Treating [name]: [birth year] (70 y.o. [name], Millard.Loa Primary Care [name]: [name] Other Clinician: Referring [name]: Treating [name]/Extender: [name] in Treatment: 11 Visit Information History Since Last Visit All ordered tests and consults were completed: No Patient Arrived: Ambulatory Added or deleted any medications: No Arrival Time: 08:21 Any new allergies or adverse reactions: No Accompanied By: alone Had a fall or experienced change in No Transfer Assistance: None activities of daily living that may affect Patient Identification Verified: Yes risk of falls: Secondary Verification Process Completed: Yes Signs or symptoms of abuse/neglect since last visito No Patient Requires Transmission-Based Precautions: No Hospitalized since last visit: No Patient Has Alerts: Yes Implantable device outside of the clinic excluding No Patient Alerts: Patient on Blood Thinner cellular tissue based products placed in the center R ABI non compressible since last visit: Has Dressing in Place as Prescribed: Yes Has Compression in Place as Prescribed: Yes Pain Present Now: No Electronic Signature(s) Signed: 10/23/2020 8:01:28 PM By: [name] Previous Signature: 10/21/2020 9:10:56 PM Version By: [name] Entered By: [name] on 10/23/2020 20:01:28 -------------------------------------------------------------------------------- Clinic Level of Care Assessment Details Patient Name: Date of Service: [name], [name] 09/27/2020 8:15 A M Medical Record Number: [id number] Patient Account Number: [id number] Date of Birth/Sex: Treating [name]: [birth year] (70 y.o. [name] Primary Care [name]: [name]  Other Clinician: Referring Tarry Blayney: Treating [name]/Extender: [name] in Treatment: 11 Clinic Level of Care Assessment Items TOOL 4 Quantity Score []  - 0 Use when only an EandM is performed on FOLLOW-UP visit ASSESSMENTS - Nursing Assessment / Reassessment X- 1 10 Reassessment of Co-morbidities (includes updates in patient status) X- 1 5 Reassessment of Adherence to Treatment Plan ASSESSMENTS - Wound and Skin A ssessment / Reassessment X - Simple Wound Assessment / Reassessment - one wound 1 5 []  - 0 Complex Wound Assessment / Reassessment - multiple wounds []  - 0 Dermatologic / Skin Assessment (not related to wound area) ASSESSMENTS - Focused Assessment []  - 0 Circumferential Edema Measurements - multi extremities []  - 0 Nutritional Assessment / Counseling / Intervention X- 1 5 Lower Extremity Assessment (monofilament, tuning fork, pulses) []  - 0 Peripheral Arterial Disease Assessment (using hand held doppler) ASSESSMENTS - Ostomy and/or Continence Assessment and Care []  - 0 Incontinence Assessment and Management []  - 0 Ostomy Care Assessment and Management (repouching, etc.) PROCESS - Coordination of Care X - Simple Patient / Family Education for ongoing care 1 15 []  - 0 Complex (extensive) Patient / Family Education for ongoing care X- 1 10 Staff obtains , Records, T Results / Process Orders est []  - 0 Staff telephones HHA, Nursing Homes / Clarify orders / etc []  - 0 Routine Transfer to another Facility (non-emergent condition) []  - 0 Routine Hospital Admission (non-emergent condition) []  - 0 New Admissions / / Ordering NPWT Apligraf, etc. , []  - 0 Emergency Hospital Admission (emergent condition) X- 1 10 Simple Discharge Coordination []  - 0 Complex (extensive) Discharge Coordination PROCESS - Special Needs []  - 0 Pediatric / Minor Patient Management []  - 0 Isolation Patient Management []  -  0 Hearing / Language / Visual special needs []  - 0 Assessment of Community assistance (transportation, D/C planning, etc.) []  - 0 Additional assistance / Altered mentation []  -  0 Support Surface(s) Assessment (bed, cushion, seat, etc.) INTERVENTIONS - Wound Cleansing / Measurement X - Simple Wound Cleansing - one wound 1 5 []  - 0 Complex Wound Cleansing - multiple wounds X- 1 5 Wound Imaging (photographs - any number of wounds) []  - 0 Wound Tracing (instead of photographs) []  - 0 Simple Wound Measurement - one wound []  - 0 Complex Wound Measurement - multiple wounds INTERVENTIONS - Wound Dressings X - Small Wound Dressing one or multiple wounds 1 10 []  - 0 Medium Wound Dressing one or multiple wounds []  - 0 Large Wound Dressing one or multiple wounds []  - 0 Application of Medications - topical []  - 0 Application of Medications - injection INTERVENTIONS - Miscellaneous []  - 0 External ear exam []  - 0 Specimen Collection (cultures, biopsies, blood, body fluids, etc.) []  - 0 Specimen(s) / Culture(s) sent or taken to Lab for analysis []  - 0 Patient Transfer (multiple staff / Nurse, adultHoyer Lift / Similar devices) []  - 0 Simple Staple / Suture removal (25 or less) []  - 0 Complex Staple / Suture removal (26 or more) []  - 0 Hypo / Hyperglycemic Management (close monitor of Blood Glucose) []  - 0 Ankle / Brachial Index (ABI) - do not check if billed separately X- 1 5 Vital Signs Has the patient been seen at the hospital within the last three years: Yes Total Score: 85 Level Of Care: New/Established - Level 3 Electronic Signature(s) Signed: 09/27/2020 6:17:32 PM By: [name], [name], BSN Entered By: [name], [name] on 09/27/2020 08:52:41 -------------------------------------------------------------------------------- Encounter Discharge Information Details Patient Name: Date of Service: [name], [name]. 09/27/2020 8:15 A M Medical Record Number: [id number] Patient Account  Number: [id number] Date of Birth/Sex: Treating [name]: 06/03/1950 [age] y.o. [name] SchoonerM) Boehlein, [name] Primary Care Joslynne Klatt: [name] SellsHall, John Other Clinician: Referring Britanny Marksberry: Treating Demarie Hyneman/Extender: [name] CrumbleHoffman, Jessica Hall, John Weeks in Treatment: 11 Encounter Discharge Information Items Discharge Condition: Stable Ambulatory Status: Ambulatory Discharge Destination: Home Transportation: Private Auto Accompanied By: self Schedule Follow-up Appointment: Yes Clinical Summary of Care: Patient Declined Electronic Signature(s) Signed: 09/27/2020 6:17:32 PM By: [name], [name], BSN Entered By: [name], [name] on 09/27/2020 08:56:16 -------------------------------------------------------------------------------- Lower Extremity Assessment Details Patient Name: Date of Service: [name], [name]. 09/27/2020 8:15 A M Medical Record Number: [id number] Patient Account Number: [id number] Date of Birth/Sex: Treating [name]: 06/04/1950 (70 y.o. Tammy SoursM) Deaton, Bobbi Primary Care Shaleena Crusoe: [name] SellsHall, John Other Clinician: Referring Dashayla Theissen: Treating Raidyn Breiner/Extender: [name] CrumbleHoffman, Jessica Hall, John Weeks in Treatment: 11 Edema Assessment Assessed: [Left: No] Franne Forts[Right: No] Edema: [Left: N] [Right: o] Calf Left: Right: Point of Measurement: 32 cm From Medial Instep 41 cm Ankle Left: Right: Point of Measurement: 11 cm From Medial Instep 26 cm Vascular Assessment Pulses: Dorsalis Pedis Palpable: [Right:Yes] Electronic Signature(s) Signed: 10/23/2020 8:02:42 PM By: [name] Stalleaton, Bobbi Previous Signature: 10/21/2020 9:10:56 PM Version By: [name] Stalleaton, Bobbi Entered By: [name] Stalleaton, Bobbi on 10/23/2020 20:01:56 -------------------------------------------------------------------------------- Multi Wound Chart Details Patient Name: Date of Service: [name], [name]. 09/27/2020 8:15 A M Medical Record Number: [id number] Patient Account Number: [id number] Date of Birth/Sex: Treating [name]: 07/29/1950 [age] y.o. [name] SchoonerM) Boehlein, [name] Primary  Care Myrikal Messmer: [name] SellsHall, John Other Clinician: Referring Susanna Benge: Treating Wilmar Prabhakar/Extender: [name] CrumbleHoffman, Jessica Hall, John Weeks in Treatment: 11 Vital Signs Height(in): 70 Pulse(bpm): 67 Weight(lbs): 225 Blood Pressure(mmHg): 128/73 Body Mass Index(BMI): 32 Temperature(F): 97.9 Respiratory Rate(breaths/min): 18 Photos: [4:No Photos Right Metatarsal head first] [N/A:N/A N/A] Wound Location: [4:Blister] [N/A:N/A] Wounding Event: [4:Diabetic Wound/Ulcer of the Lower] [N/A:N/A] Primary Etiology: [4:Extremity Cataracts, Sleep Apnea, Coronary] [N/A:N/A] Comorbid  History: [4:Artery Disease, Hypertension, Peripheral Arterial Disease, Type II Diabetes, Neuropathy 05/07/2020] [N/A:N/A] Date Acquired: [4:11] [N/A:N/A] Weeks of Treatment: [4:Open] [N/A:N/A] Wound Status: [4:0x0x0] [N/A:N/A] Measurements L x [name] x D (cm) [4:0] [N/A:N/A] A (cm) : rea [4:0] [N/A:N/A] Volume (cm) : [4:100.00%] [N/A:N/A] % Reduction in A rea: [4:100.00%] [N/A:N/A] % Reduction in Volume: [4:Grade 2] [N/A:N/A] Classification: [4:Medium] [N/A:N/A] Exudate A mount: [4:Serosanguineous] [N/A:N/A] Exudate Type: [4:red, brown] [N/A:N/A] Exudate Color: [4:Well defined, not attached] [N/A:N/A] Wound Margin: [4:None Present (0%)] [N/A:N/A] Granulation A mount: [4:None Present (0%)] [N/A:N/A] Necrotic A mount: [4:Fascia: No] [N/A:N/A] Exposed Structures: [4:Fat Layer (Subcutaneous Tissue): No Tendon: No Muscle: No Joint: No Bone: No Large (67-100%)] [N/A:N/A] Treatment Notes Electronic Signature(s) Signed: 09/27/2020 9:26:18 AM By: Geralyn Corwin DO Signed: 09/27/2020 6:17:32 PM By: [name] Deed [name], BSN Entered By: Geralyn Corwin on 09/27/2020 09:19:58 -------------------------------------------------------------------------------- Multi-Disciplinary Care Plan Details Patient Name: Date of Service: [name]. 09/27/2020 8:15 A M Medical Record Number: [id number] Patient Account Number: [id number] Date of  Birth/Sex: Treating [name]: [birth year] (70 y.o. [name] Primary Care Terrilynn Postell: [name] Other Clinician: Referring Stone Spirito: Treating Surafel Hilleary/Extender: [name] in Treatment: 11 Multidisciplinary Care Plan reviewed with physician Active Inactive Electronic Signature(s) Signed: 09/27/2020 6:17:32 PM By: [name] Deed [name], BSN Entered By: [name] Deed on 09/27/2020 08:51:47 -------------------------------------------------------------------------------- Pain Assessment Details Patient Name: Date of Service: [name]. 09/27/2020 8:15 A M Medical Record Number: [id number] Patient Account Number: [id number] Date of Birth/Sex: Treating [name]: [birth year] (70 y.o. [name] Primary Care Quetzally Callas: [name] Other Clinician: Referring Mitsy Owen: Treating Jeneva Schweizer/Extender: [name] in Treatment: 11 Active Problems Location of Pain Severity and Description of Pain Patient Has Paino No Site Locations Rate the pain. Current Pain Level: 0 Pain Management and Medication Current Pain Management: Electronic Signature(s) Signed: 10/23/2020 8:02:42 PM By: [name] Previous Signature: 10/21/2020 9:10:56 PM Version By: [name] Entered By: [name] on 10/23/2020 20:01:48 -------------------------------------------------------------------------------- Patient/Caregiver Education Details Patient Name: Date of Service: Busic, [name]. 6/24/2022andnbsp8:15 A M Medical Record Number: [id number] Patient Account Number: [id number] Date of Birth/Gender: Treating [name]: 13-Aug-1950 (70 y.o. [name] Primary Care Physician: [name] Other Clinician: Referring Physician: Treating Physician/Extender: [name] in Treatment: 11 Education Assessment Education Provided To: Patient Education Topics Provided Elevated Blood Sugar/ Impact on Healing: Methods:  Explain/Verbal Responses: Reinforcements needed, State content correctly Offloading: Methods: Explain/Verbal Responses: Reinforcements needed, State content correctly Electronic Signature(s) Signed: 09/27/2020 6:17:32 PM By: [name] Deed [name], BSN Entered By: [name] Deed on 09/27/2020 08:30:11 -------------------------------------------------------------------------------- Wound Assessment Details Patient Name: Date of Service: [name]. 09/27/2020 8:15 A M Medical Record Number: [id number] Patient Account Number: [id number] Date of Birth/Sex: Treating [name]: Feb 01, 1951 (70 y.o. [name] Primary Care Cornell Gaber: [name] Other Clinician: Referring Kataya Guimont: Treating Bradlee Heitman/Extender: [name] in Treatment: 11 Wound Status Wound Number: 4 Primary Diabetic Wound/Ulcer of the Lower Extremity Etiology: Wound Location: Right Metatarsal head first Wound Healed - Epithelialized Wounding Event: Blister Status: Date Acquired: 05/07/2020 Comorbid Cataracts, Sleep Apnea, Coronary Artery Disease, Hypertension, Weeks Of Treatment: 11 History: Peripheral Arterial Disease, Type II Diabetes, Neuropathy Clustered Wound: No Photos Wound Measurements Length: (cm) Width: (cm) Depth: (cm) Area: (cm) Volume: (cm) 0 % Reduction in Area: 100% 0 % Reduction in Volume: 100% 0 Epithelialization: Large (67-100%) 0 Tunneling: No 0 Undermining: No Wound Description Classification: Grade 2 Wound Margin: Well defined, not attached Exudate Amount: Medium  Exudate Type: Serosanguineous Exudate Color: red, brown Foul Odor After Cleansing: No Slough/Fibrino No Wound Bed Granulation Amount: None Present (0%) Exposed Structure Necrotic Amount: None Present (0%) Fascia Exposed: No Fat Layer (Subcutaneous Tissue) Exposed: No Tendon Exposed: No Muscle Exposed: No Joint Exposed: No Bone Exposed: No Electronic Signature(s) Signed: 10/23/2020 8:02:42 PM  By: [name] Previous Signature: 09/27/2020 12:34:28 PM Version By: Karl Ito Previous Signature: 10/21/2020 9:10:56 PM Version By: [name] Entered By: [name] on 10/23/2020 20:02:14 -------------------------------------------------------------------------------- Vitals Details Patient Name: Date of Service: [name]. 09/27/2020 8:15 A M Medical Record Number: [id number] Patient Account Number: [id number] Date of Birth/Sex: Treating [name]: July 18, 1950 (70 y.o. [name] Primary Care Leisha Trinkle: [name] Other Clinician: Referring Johnny Gorter: Treating Art Levan/Extender: [name] in Treatment: 11 Vital Signs Time Taken: 08:20 Temperature (F): 97.9 Height (in): 70 Pulse (bpm): 67 Weight (lbs): 225 Respiratory Rate (breaths/min): 18 Body Mass Index (BMI): 32.3 Blood Pressure (mmHg): 128/73 Reference Range: 80 - 120 mg / dl Airway Pulse Oximetry (%): 94 Electronic Signature(s) Signed: 10/23/2020 8:01:40 PM By: [name] Previous Signature: 10/21/2020 9:10:56 PM Version By: [name] Entered By: [name] on 10/23/2020 20:01:39

## 2020-11-11 DIAGNOSIS — E1165 Type 2 diabetes mellitus with hyperglycemia: Secondary | ICD-10-CM | POA: Diagnosis not present

## 2020-12-10 DIAGNOSIS — Z0000 Encounter for general adult medical examination without abnormal findings: Secondary | ICD-10-CM | POA: Diagnosis not present

## 2020-12-10 DIAGNOSIS — E559 Vitamin D deficiency, unspecified: Secondary | ICD-10-CM | POA: Diagnosis not present

## 2020-12-10 DIAGNOSIS — E119 Type 2 diabetes mellitus without complications: Secondary | ICD-10-CM | POA: Diagnosis not present

## 2020-12-10 DIAGNOSIS — E782 Mixed hyperlipidemia: Secondary | ICD-10-CM | POA: Diagnosis not present

## 2020-12-12 DIAGNOSIS — Z0001 Encounter for general adult medical examination with abnormal findings: Secondary | ICD-10-CM | POA: Diagnosis not present

## 2020-12-12 DIAGNOSIS — Z125 Encounter for screening for malignant neoplasm of prostate: Secondary | ICD-10-CM | POA: Diagnosis not present

## 2020-12-12 DIAGNOSIS — R011 Cardiac murmur, unspecified: Secondary | ICD-10-CM | POA: Diagnosis not present

## 2020-12-12 DIAGNOSIS — Z951 Presence of aortocoronary bypass graft: Secondary | ICD-10-CM | POA: Diagnosis not present

## 2020-12-12 DIAGNOSIS — I10 Essential (primary) hypertension: Secondary | ICD-10-CM | POA: Diagnosis not present

## 2020-12-12 DIAGNOSIS — E782 Mixed hyperlipidemia: Secondary | ICD-10-CM | POA: Diagnosis not present

## 2020-12-12 DIAGNOSIS — E1121 Type 2 diabetes mellitus with diabetic nephropathy: Secondary | ICD-10-CM | POA: Diagnosis not present

## 2020-12-12 DIAGNOSIS — E1165 Type 2 diabetes mellitus with hyperglycemia: Secondary | ICD-10-CM | POA: Diagnosis not present

## 2020-12-12 DIAGNOSIS — E559 Vitamin D deficiency, unspecified: Secondary | ICD-10-CM | POA: Diagnosis not present

## 2020-12-19 NOTE — Progress Notes (Signed)
Cardiology Office Note:    Date:  12/20/2020   ID:  Edson Snowball, DOB 04-22-1950, MRN [id number]  PCP:  Celene Squibb, MD   [facility] Providers Cardiologist:  Sherren Mocha, MD Cardiology APP:  Sharmon Revere      Referring MD: Celene Squibb, MD   Chief Complaint:  F/u for CAD    Patient Profile:   [name] is a 70 y.o. male with:  Coronary artery disease  S/p CABG in 2009 Cath 6/21: patent grafts, Med Rx  Hypertension  Hyperlipidemia  Diabetes mellitus  Hx of diabetic foot ulcer PAD  eval by Dr. Fletcher Anon in 2019 >> small vessel dz >> med Rx  S/p cryptogenic CVA 01/2019 2 vascular territories (R temporoparietal and occipital) - ?cardioembolic  Event monitor done by PCP - No AFib  Pt declined ref to EP for ILR   Prior CV studies: Cardiac catheterization 2019-10-25 LAD occluded LCx occluded Subtotal calcific occlusion of the RCA and PDA L-LAD patent S-OM/R PLA patent S-RPDA patent S-Dx patent EF 60   Event monitor 02/2019 Sinus rhythm, HR 48-99 No atrial fibrillation   Echocardiogram 02/01/2019 EF 60-65, mod LVH, mildly reduced RVSF, trace MR, mild TR   Carotid US 01/31/2019 IMPRESSION: Color duplex indicates minimal heterogeneous and calcified plaque, with no hemodynamically significant stenosis by duplex criteria in the extracranial cerebrovascular circulation.   Echocardiogram 10/29/2017 Mild LVH, EF 48-88, normal diastolic function, trivial AI, moderate LAE   Nuclear stress test 10/29/2017 EF 50, fixed septal/anteroseptal, lateral/inferolateral defect suggesting artifact, no ischemia, low risk   ETT-Myoview (03/14/13):   Ex 9:00, ECG with 1 mm ST depression inferiorly, primarily reversible medium-sized anteroseptal defect, EF 54% (intermediate risk).    Carotid US (04/2012):   Mild plaque bilaterally (0-39%) => repeat in 1 year.   History of Present Illness: Mr. Juenger was last seen in 2/22.  He has been going to the wound clinic  since last seen for a R foot ulcer.  He did see Dr. Donnetta Hutching for peripheral arterial disease and conservative mgmt has been continued.  He returns for f/u.  He is here alone.  Overall, he has been doing well.  He plans to officially retire soon.  He wants to start a walking program.  He does get short of breath with more extreme activities.  He has not had chest pain.  He has not had syncope, orthopnea.  He has chronic leg edema without significant change.  He plans to go skydiving soon.        Past Medical History:  Diagnosis Date   Allergy    CAD (coronary artery disease), native coronary artery    Multiple PCI procedures followed by CABG in 2009 with LIMA-LAD, SVG-D, seq SVG-OM and PLV, SVG-PDA. Echo (3/12): EF 60-65%, mild MR. // Nuc Stress Test 7/19:  EF 50, artifact, no ischemia, low risk    Carotid artery disease ([place]) 01/31/2017   dopplers 1/14:  0-39% bilat   Carotid stenosis    dopplers 1/14:  0-39% bilat   Cataract    REMOVED   Colon polyps 2006   Colonoscopy-Dr. Patterson(Tubular Adenoma)    Coronary Artery Disease 06/03/2008   Annotation: S/P CABG 2009 Qualifier: Diagnosis of  By: Olevia Perches, MD, Glenetta Hew    DM2 (diabetes mellitus, type 2) ([place])    Followed by Dr. Carlis Abbott   Essential hypertension 06/03/2008   Qualifier: Diagnosis of  By: Olevia Perches, MD, Glenetta Hew    History  of echocardiogram    Echo 7/19: Mild LVH, EF 16-07, normal diastolic function, trivial AI, moderate LAE   HLD (hyperlipidemia)    HTN (hypertension)    Hx of cardiovascular stress test    ETT-Myoview (03/2013):  Anteroseptal reversible defect, EF 54%, intermediate risk.   Hyperlipidemia 06/03/2008   Qualifier: Diagnosis of  By: Olevia Perches, MD, Glenetta Hew    Ischemic stroke [facility]) 01/30/2019   OSA (obstructive sleep apnea) 08/25/2011   PAD (peripheral artery disease) ([place]) 12/10/2017   Postoperative atrial fibrillation ([place]) 2009   Renal insufficiency 01/31/2019   Stroke [facility])    Type 2 diabetes  mellitus with complication, with long-term current use of insulin ([place]) 06/03/2008   Qualifier: Diagnosis of  By: Olevia Perches, MD, Glenetta Hew    Ulcer    left foot   Varicose veins of lower extremities with other complications 06/10/1060   Current Medications: Current Meds  Medication Sig   amitriptyline (ELAVIL) 75 MG tablet Take 75 mg by mouth at bedtime.   amLODipine (NORVASC) 10 MG tablet TAKE 1 TABLET BY MOUTH EVERY DAY   clopidogrel (PLAVIX) 75 MG tablet TAKE 1 TABLET BY MOUTH EVERY DAY   ezetimibe (ZETIA) 10 MG tablet TAKE 1 TABLET BY MOUTH EVERY DAY   fenofibrate 54 MG tablet TAKE 1 TABLET BY MOUTH EVERY DAY   furosemide (LASIX) 40 MG tablet TAKE 1 TABLET BY MOUTH EVERY DAY   glucose monitoring kit (FREESTYLE) monitoring kit 1 each by Does not apply route as needed for other. Freestyle Libre   isosorbide mononitrate (IMDUR) 30 MG 24 hr tablet TAKE 1.5 TABLETS (45 MG TOTAL) BY MOUTH DAILY.   metFORMIN (GLUCOPHAGE) 1000 MG tablet Take 1,000 mg by mouth 2 (two) times daily with a meal.   metoprolol tartrate (LOPRESSOR) 100 MG tablet TAKE 1 TABLET BY MOUTH TWICE A DAY   NOVOLOG MIX 70/30 FLEXPEN (70-30) 100 UNIT/ML Pen Inject 40-60 Units into the skin 2 (two) times daily. Per sliding scale   OZEMPIC, 0.25 OR 0.5 MG/DOSE, 2 MG/1.5ML SOPN Inject 1 unit into the skin per week   rosuvastatin (CRESTOR) 20 MG tablet TAKE 1 TABLET BY MOUTH EVERY DAY   [DISCONTINUED] aspirin EC 325 MG EC tablet Take 1 tablet (325 mg total) by mouth daily.   [DISCONTINUED] nitroGLYCERIN (NITROSTAT) 0.4 MG SL tablet PLACE 1 TABLET UNDER THE TONGUE EVERY 5 MINUTES AS NEEDED FOR CHEST PAIN    Allergies:   Penicillins   Social History   Tobacco Use   Smoking status: Never   Smokeless tobacco: Former    Types: Chew    Quit date: 04/06/1993  Vaping Use   Vaping Use: Never used  Substance Use Topics   Alcohol use: No   Drug use: No    Family Hx: The patient's family history includes Cancer in his mother;  Colon cancer in his mother; Coronary artery disease in an other family member; Diabetes in his brother, father, and mother; Heart disease in his father, mother, and another family member; Hyperlipidemia in his father and mother; Hypertension in his father; Other in his brother and brother; Prostate cancer in his brother and father.  Review of Systems  Gastrointestinal:  Negative for hematochezia.  Genitourinary:  Negative for hematuria.    EKGs/Labs/Other Test Reviewed:    EKG:  EKG is  ordered today.  The ekg ordered today demonstrates NSR, HR 66, left axis deviation, nonspecific ST-T wave changes, increased artifact, QTC 444  Recent Labs: No results found  for requested labs within last 8760 hours.   Recent Lipid Panel Lab Results  Component Value Date/Time   CHOL 121 08/07/2019 08:15 AM   TRIG 130 08/07/2019 08:15 AM   HDL 36 (L) 08/07/2019 08:15 AM   LDLCALC 62 08/07/2019 08:15 AM   LDLDIRECT 78.0 11/26/2014 10:10 AM   Risk Assessment/Calculations:          Physical Exam:    VS:  BP 118/62 (BP Location: Left Arm, Patient Position: Sitting, Cuff Size: Large)   Pulse 66   Ht 5' 10"  (1.778 m)   Wt 244 lb (110.7 kg)   SpO2 97%   BMI 35.01 kg/m     Wt Readings from Last 3 Encounters:  12/20/20 244 lb (110.7 kg)  09/09/20 237 lb (107.5 kg)  05/29/20 239 lb (108.4 kg)    Constitutional:      Appearance: Healthy appearance. Not in distress.  Neck:     Vascular: No carotid bruit. JVD normal.  Pulmonary:     Effort: Pulmonary effort is normal.     Breath sounds: No wheezing. No rales.  Cardiovascular:     Normal rate. Regular rhythm. Normal S1. Normal S2.      Murmurs: There is no murmur.  Edema:    Peripheral edema present.    Pretibial: bilateral 1+ edema of the pretibial area. Abdominal:     Palpations: Abdomen is soft.  Skin:    General: Skin is warm and dry.  Neurological:     General: No focal deficit present.     Mental Status: Alert and oriented to person,  place and time.     Cranial Nerves: Cranial nerves are intact.         ASSESSMENT & PLAN:    1. Coronary artery disease involving native coronary artery of native heart with angina pectoris ([place]) History of CABG in 2009.  Nuclear stress test in 2019 was low risk.  Cardiac catheterization in 09/2019 demonstrated patent bypass grafts.  He is doing well without anginal symptoms at this time.  Continue amlodipine 10 mg daily, clopidogrel 75 mg daily, ezetimibe 10 mg daily, isosorbide mononitrate 45 mg daily, metoprolol tartrate 100 mg twice daily, rosuvastatin 20 mg daily.  Decrease aspirin to 81 mg daily.  2. Essential hypertension Blood pressure is well controlled.  Continue amlodipine 10 mg daily, isosorbide mononitrate 45 mg daily, metoprolol tartrate 100 mg twice daily.  3. Pure hypercholesterolemia LDL is well below target.  He does note some arthralgias.  Continue rosuvastatin 20 mg daily, ezetimibe 10 mg daily.  I told him that he can hold rosuvastatin for couple of weeks to see if his arthralgias improve.  If there is a clear connection between arthralgias and rosuvastatin, we could set him up with the lipid clinic to try to initiate PCSK9 inhibitor.  4. History of stroke No recurrence.  5. PAD (peripheral artery disease) ([place]) His diabetic foot ulcer did clear up.  He is not having claudication symptoms.  Dispo:  Return in about 6 months (around 06/19/2021) for Routine Follow Up, w/ [name], PA-C.   Medication Adjustments/Labs and Tests Ordered: Current medicines are reviewed at length with the patient today.  Concerns regarding medicines are outlined above.  Tests Ordered: Orders Placed This Encounter  Procedures   EKG 12-Lead    Medication Changes: Meds ordered this encounter  Medications   aspirin 81 MG EC tablet    Sig: Take 1 tablet (81 mg total) by mouth daily.  Dispense:  90 tablet    Refill:  3   nitroGLYCERIN (NITROSTAT) 0.4 MG SL tablet    Sig: PLACE 1  TABLET UNDER THE TONGUE EVERY 5 MINUTES AS NEEDED FOR CHEST PAIN    Dispense:  25 tablet    Refill:  4    Signed, [name], PA-C  12/20/2020 10:08 AM    [facility] [address], [place], [place]  [address] Phone: [phone]; Fax: [phone]

## 2020-12-20 ENCOUNTER — Telehealth: Payer: Self-pay | Admitting: *Deleted

## 2020-12-20 ENCOUNTER — Other Ambulatory Visit: Payer: Self-pay

## 2020-12-20 ENCOUNTER — Encounter: Payer: Self-pay | Admitting: Physician Assistant

## 2020-12-20 ENCOUNTER — Ambulatory Visit: Payer: Medicare HMO | Admitting: Physician Assistant

## 2020-12-20 VITALS — BP 118/62 | HR 66 | Ht 70.0 in | Wt 244.0 lb

## 2020-12-20 DIAGNOSIS — E7800 Pure hypercholesterolemia, unspecified: Secondary | ICD-10-CM | POA: Diagnosis not present

## 2020-12-20 DIAGNOSIS — I739 Peripheral vascular disease, unspecified: Secondary | ICD-10-CM

## 2020-12-20 DIAGNOSIS — I10 Essential (primary) hypertension: Secondary | ICD-10-CM | POA: Diagnosis not present

## 2020-12-20 DIAGNOSIS — Z8673 Personal history of transient ischemic attack (TIA), and cerebral infarction without residual deficits: Secondary | ICD-10-CM

## 2020-12-20 DIAGNOSIS — I25119 Atherosclerotic heart disease of native coronary artery with unspecified angina pectoris: Secondary | ICD-10-CM

## 2020-12-20 DIAGNOSIS — E11621 Type 2 diabetes mellitus with foot ulcer: Secondary | ICD-10-CM

## 2020-12-20 MED ORDER — ASPIRIN 81 MG PO TBEC: 81.0000 mg | DELAYED_RELEASE_TABLET | Freq: Every day | ORAL | 3 refills | Status: AC

## 2020-12-20 MED ORDER — NITROGLYCERIN 0.4 MG SL SUBL: SUBLINGUAL_TABLET | SUBLINGUAL | 4 refills | Status: DC

## 2020-12-20 NOTE — Patient Instructions (Addendum)
Medication Instructions:   DECREASE Asprin one tablet by mouth ( 81 mg) daily. OTC.  Your nitro was filled today.   *If you need a refill on your cardiac medications before your next appointment, please call your pharmacy*   Lab Work: Will request labs from PCP.   If you have labs (blood work) drawn today and your tests are completely normal, you will receive your results only by: MyChart Message (if you have MyChart) OR A paper copy in the mail If you have any lab test that is abnormal or we need to change your treatment, we will call you to review the results.   Testing/Procedures:  -NONE  Follow-Up: At [facility], you and your health needs are our priority.  As part of our continuing mission to provide you with exceptional heart care, we have created designated Provider Care Teams.  These Care Teams include your primary Cardiologist (physician) and Advanced Practice Providers (APPs -  Physician Assistants and Nurse Practitioners) who all work together to provide you with the care you need, when you need it.  We recommend signing up for the patient portal called "MyChart".  Sign up information is provided on this After Visit Summary.  MyChart is used to connect with patients for Virtual Visits (Telemedicine).  Patients are able to view lab/test results, encounter notes, upcoming appointments, etc.  Non-urgent messages can be sent to your provider as well.   To learn more about what you can do with MyChart, go to [email or web address].    Your next appointment:   6 month(s)  The format for your next appointment:   In Person  Provider:   Tereso Newcomer, PA-C   Other Instructions  If you decide to stop your statin due to muscle aches please send mychart or call @ [phone] to let [facility] know.

## 2020-12-20 NOTE — Telephone Encounter (Signed)
S/w Sherron Monday at Dr.Hall's office @ [phone] to get recent labs faxed to office.

## 2021-01-08 ENCOUNTER — Ambulatory Visit: Payer: Medicare HMO | Admitting: Podiatry

## 2021-01-08 ENCOUNTER — Other Ambulatory Visit: Payer: Self-pay

## 2021-01-08 ENCOUNTER — Encounter: Payer: Self-pay | Admitting: Podiatry

## 2021-01-08 DIAGNOSIS — M79675 Pain in left toe(s): Secondary | ICD-10-CM | POA: Diagnosis not present

## 2021-01-08 DIAGNOSIS — E1142 Type 2 diabetes mellitus with diabetic polyneuropathy: Secondary | ICD-10-CM | POA: Diagnosis not present

## 2021-01-08 DIAGNOSIS — B351 Tinea unguium: Secondary | ICD-10-CM | POA: Diagnosis not present

## 2021-01-08 DIAGNOSIS — E1140 Type 2 diabetes mellitus with diabetic neuropathy, unspecified: Secondary | ICD-10-CM | POA: Insufficient documentation

## 2021-01-08 DIAGNOSIS — M79674 Pain in right toe(s): Secondary | ICD-10-CM | POA: Diagnosis not present

## 2021-01-08 DIAGNOSIS — E1159 Type 2 diabetes mellitus with other circulatory complications: Secondary | ICD-10-CM | POA: Diagnosis not present

## 2021-01-08 NOTE — Progress Notes (Signed)
This patient returns to my office for at risk foot care.  This patient requires this care by a professional since this patient will be at risk due to having Renal insufficiency, CVA, PAD, T2DM.  He is taking plavix.   This patient is unable to cut nails himself since the patient cannot reach his nails.These nails are painful walking and wearing shoes.  This patient presents for at risk foot care today.  General Appearance  Alert, conversant and in no acute stress.  Vascular  Dorsalis pedis and posterior tibial  pulses are absent  bilaterally.  Capillary return is within normal limits  bilaterally. Temperature is within normal limits  bilaterally.  Neurologic  Senn-Weinstein monofilament wire test absent  bilaterally. Muscle power within normal limits bilaterally.  Nails Thick disfigured discolored nails with subungual debris  from hallux to fifth toes bilaterally. No evidence of bacterial infection or drainage bilaterally.  Orthopedic  No limitations of motion  feet .  No crepitus or effusions noted.  No bony pathology or digital deformities noted.HAV  B/L.  DJD midfoot  B/L.    Skin  normotropic skin noted bilaterally.  No signs of infections or ulcers noted.   Asymptomatic callus sib 1,5 right and sub 5 left.  Patient used to have ulcers at these sites.  Onychomycosis  Pain in right toes  Pain in left toes Diabetes wth angiopathy and neurologic deficit.  Consent was obtained for treatment procedures.   Mechanical debridement of nails 1-5  bilaterally performed with a nail nipper.  Filed with dremel without incident. Patient to be scheduled for diabetic orthoses/diabetic shoes.     Return office visit   3 months                   Told patient to return for periodic foot care and evaluation due to potential at risk complications.   Helane Gunther DPM

## 2021-01-11 DIAGNOSIS — E1165 Type 2 diabetes mellitus with hyperglycemia: Secondary | ICD-10-CM | POA: Diagnosis not present

## 2021-01-13 ENCOUNTER — Telehealth: Payer: Self-pay | Admitting: Podiatry

## 2021-01-13 NOTE — Telephone Encounter (Signed)
Pt left several messages stating he needed me to call or fax over the documents for him to get his diabetic shoes to Dr Talmage Nap. HEr # is [phone].  I returned call and explained that when pt is in for his appt on 10.13 we will order the shoes and send the documents needed for Dr Talmage Nap to sign. We have a process that we do to insure pt is not getting billed  full price for the inserts that are non refundable because we do not have the documents needed. Therefore we attach them together. He is concerned about not getting them in time. I told pt as long as the shoes and inserts are dispensed on or by 12.31.22 they will be considered this years.  HE said thank you for the information as he has heard several different things.

## 2021-01-16 ENCOUNTER — Ambulatory Visit (INDEPENDENT_AMBULATORY_CARE_PROVIDER_SITE_OTHER): Payer: Medicare HMO | Admitting: *Deleted

## 2021-01-16 ENCOUNTER — Other Ambulatory Visit: Payer: Self-pay

## 2021-01-16 DIAGNOSIS — E1142 Type 2 diabetes mellitus with diabetic polyneuropathy: Secondary | ICD-10-CM

## 2021-01-16 NOTE — Progress Notes (Signed)
Patient presents to the office today for diabetic shoe and insole measuring.  Patient was measured with brannock device to determine size and width for 1 pair of extra depth shoes and foam casted for 3 pair of insoles.   Documentation of medical necessity will be sent to patient's treating diabetic doctor to verify and sign.   Patient's diabetic provider: Dr. Dorisann Frames  Shoes and insoles will be ordered at that time and patient will be notified for an appointment for fitting when they arrive.   Shoe size (per patient): 11   Brannock measurement: RIGHT - 11 2E, LEFT - 10.5 2E  Patient shoe selection-   1st choice:   NB [id number]  2nd choice:  Apex X801  Shoe size ordered: Men's 11 4E  ~Patient has always worn a New Balance sneaker in an 11 4E and would like to continue with this size and brand since it has worked for him for [employer]

## 2021-02-02 ENCOUNTER — Other Ambulatory Visit: Payer: Self-pay | Admitting: Cardiovascular Disease

## 2021-02-04 DIAGNOSIS — E1165 Type 2 diabetes mellitus with hyperglycemia: Secondary | ICD-10-CM | POA: Diagnosis not present

## 2021-02-11 ENCOUNTER — Telehealth: Payer: Self-pay | Admitting: Podiatry

## 2021-02-11 DIAGNOSIS — E1165 Type 2 diabetes mellitus with hyperglycemia: Secondary | ICD-10-CM | POA: Diagnosis not present

## 2021-02-11 NOTE — Telephone Encounter (Signed)
Pt left message asking about his diabetic shoes status.  I returned call after checking and left a message that it looks like they should be shipping in the next week or so and I would call when they come in.

## 2021-02-12 ENCOUNTER — Other Ambulatory Visit: Payer: Self-pay | Admitting: Cardiovascular Disease

## 2021-02-24 IMAGING — US US RENAL
1 series · 14 of 25 positions shown · non-contrast
Comparison: None

CLINICAL DATA: Renal insufficiency, type II diabetes mellitus,
hypertension

EXAM:
RENAL / URINARY TRACT ULTRASOUND COMPLETE

[Series 1: us renal · 0.25mm/px · 14 of 125 slices shown]
[im 1/125]
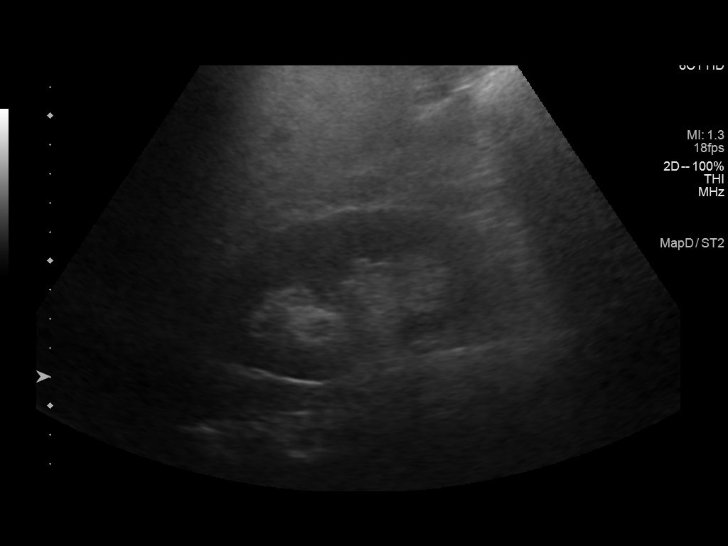
[im 11/125]
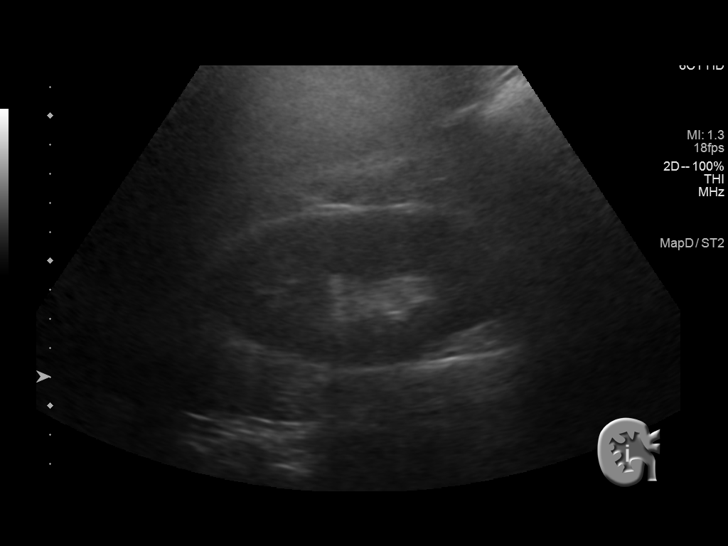
[im 21/125]
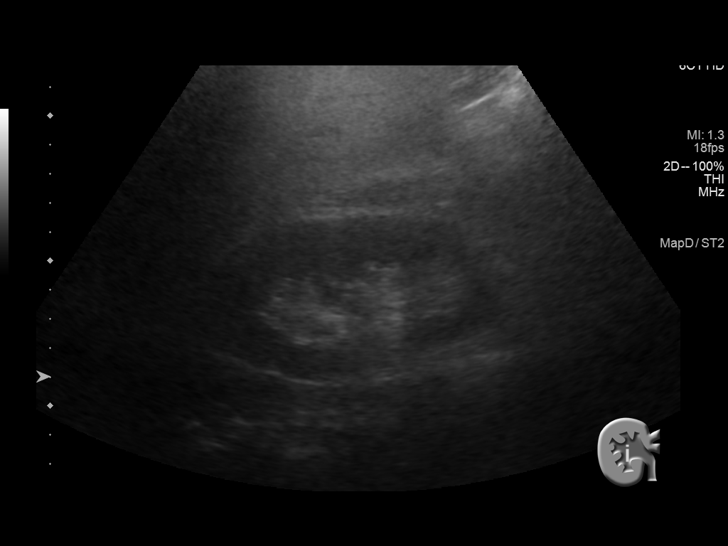
[im 32/125]
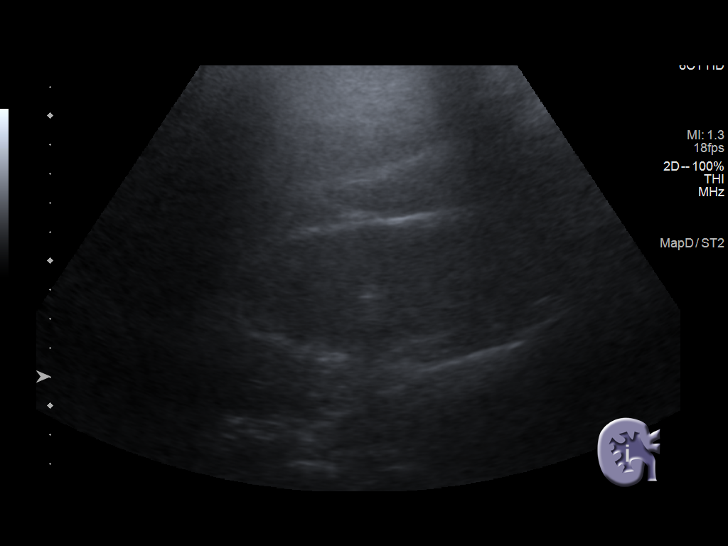
[im 42/125]
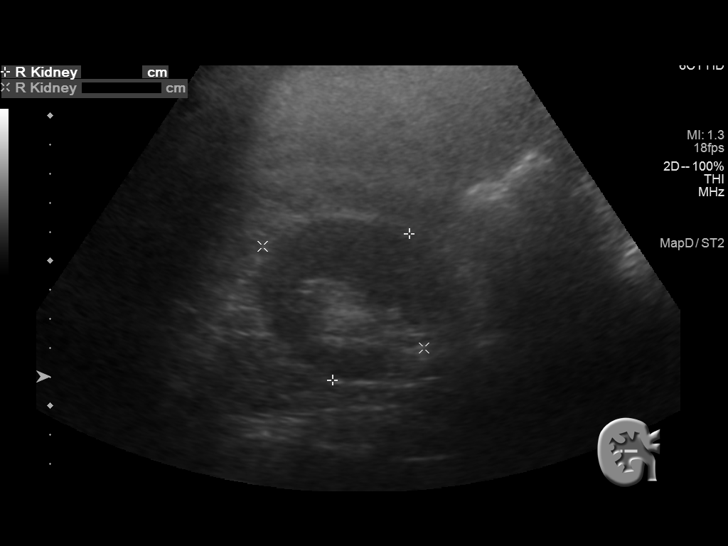
[im 47/125]
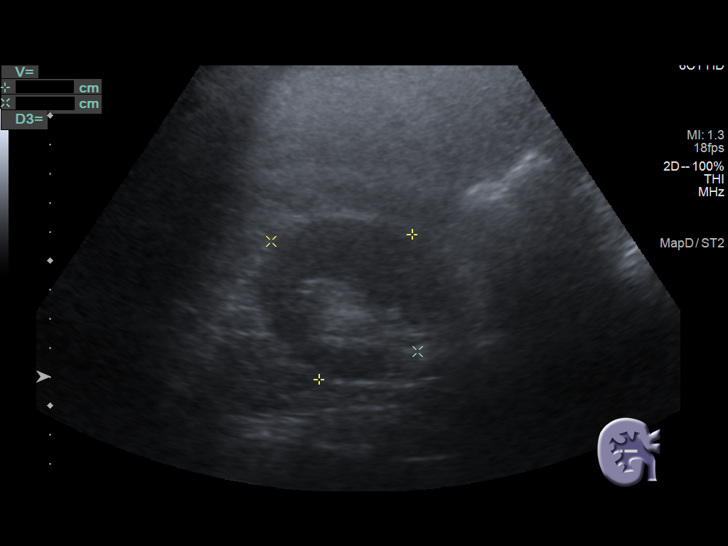
[im 57/125]
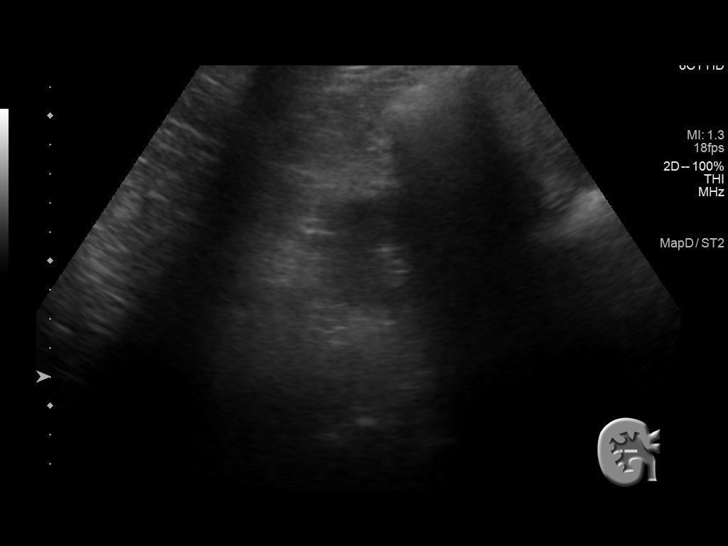
[im 68/125]
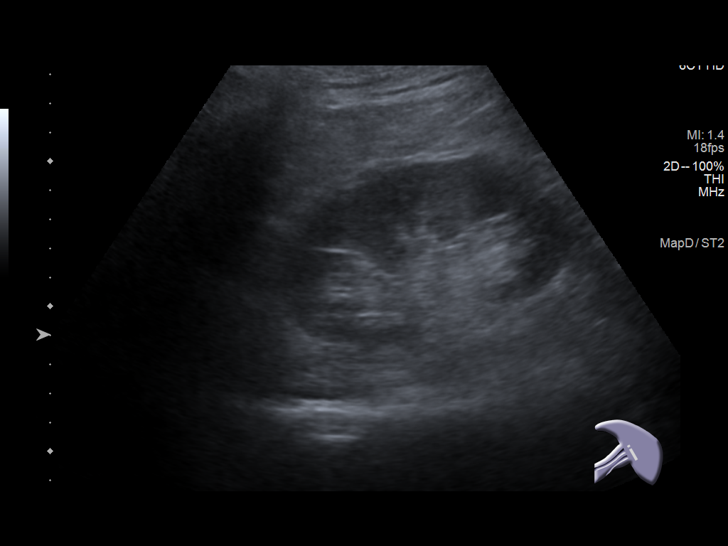
[im 78/125]
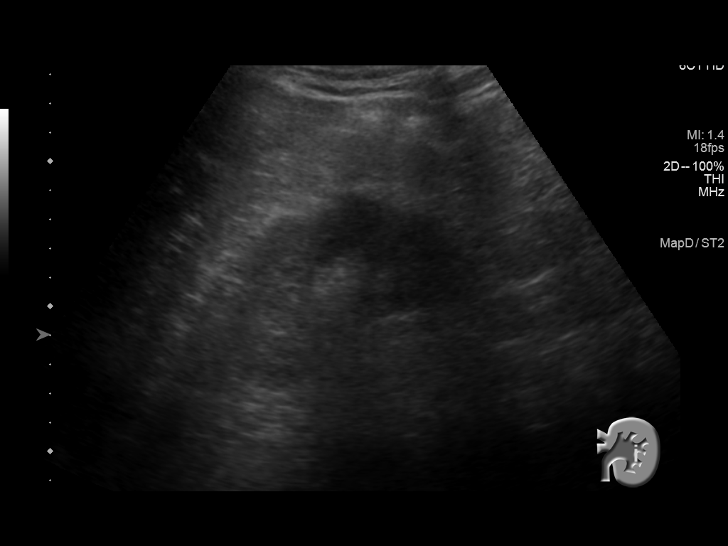
[im 83/125]
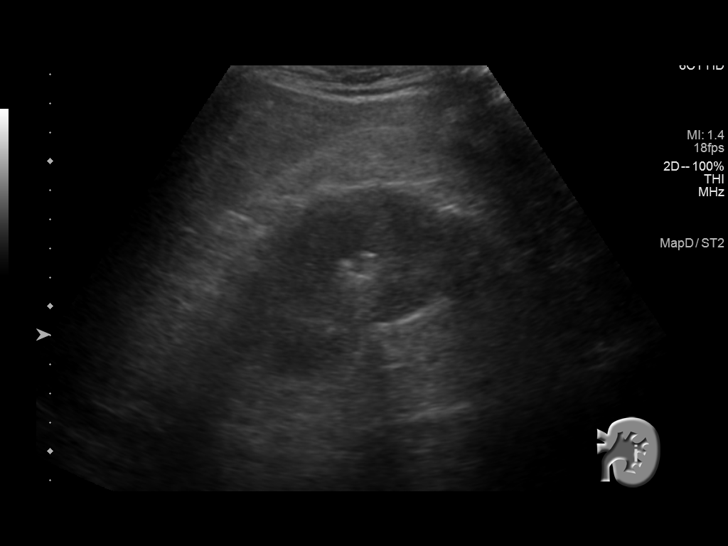
[im 94/125]
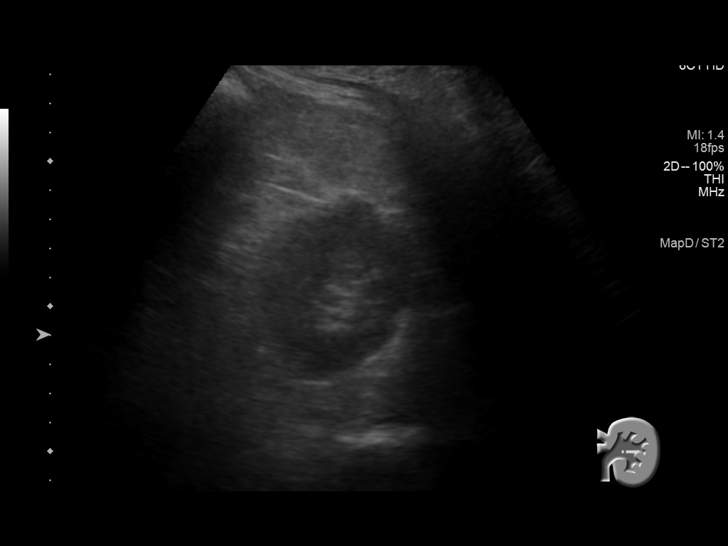
[im 104/125]
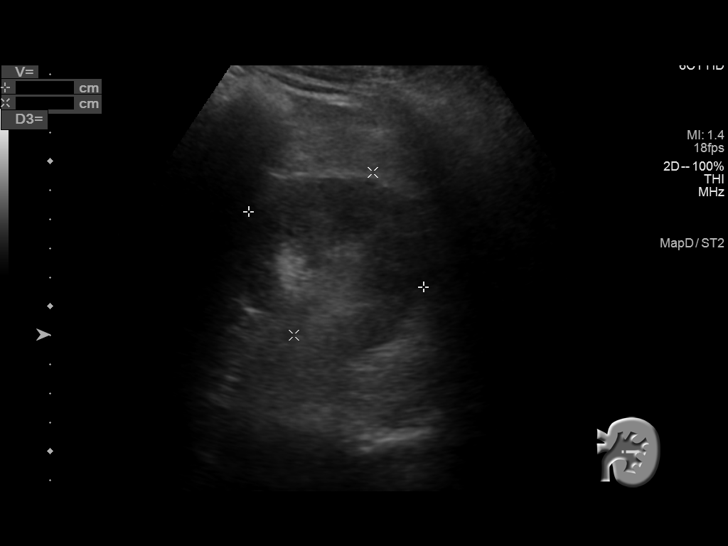
[im 114/125]
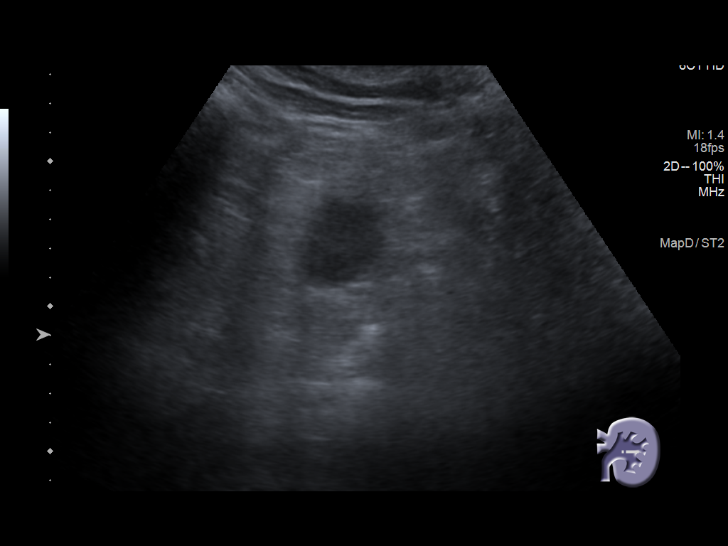
[im 125/125]
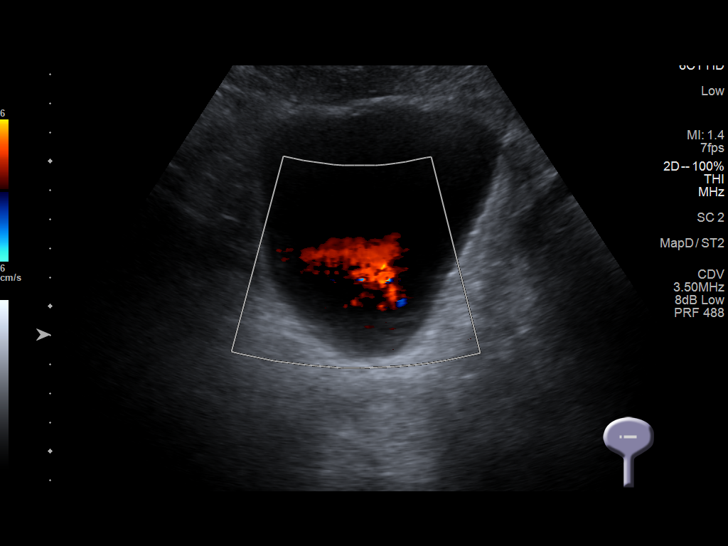

[14 of 25 positions shown; findings below may reference images not displayed]

FINDINGS: Right Kidney:

Renal measurements: 11.0 x 5.7 x 6.6 cm = volume: 204 mL. Normal
cortical thickness and echogenicity. No mass, hydronephrosis or
shadowing calcification.

Left Kidney:

Renal measurements: 10.8 x 6.0 x 7.0 cm = volume: 226 mL. Normal
cortical thickness and echogenicity. No mass, hydronephrosis or
shadowing calcification.

Bladder:

Appears normal for degree of bladder distention. BILATERAL ureteral
jets visualized

Other:

Echogenic liver, question fatty infiltration though this can be seen
with cirrhosis and certain infiltrative disorders. Hepatic margins
appear smooth on obtained images.
IMPRESSION: Normal renal ultrasound.

Question fatty infiltration of liver as above.

## 2021-02-24 IMAGING — MR MR MRA HEAD W/O CM
1 series · 14 of 48 positions shown · non-contrast
Comparison: Head CT 01/30/2019

CLINICAL DATA: Headache, acute, severe, worse headache of life.
Stroke, follow-up. Additional history provided: Headache, acute,
severe, worst headache of life. Patient awoke [REDACTED] morning with
headache and confusion, states confusion is better but not resolved.

EXAM:
MRI HEAD WITHOUT CONTRAST
MRA HEAD WITHOUT CONTRAST
TECHNIQUE: Multiplanar, multiecho pulse sequences of the brain and surrounding
structures were obtained without intravenous contrast. Angiographic
images of the head were obtained using MRA technique without
contrast.

[Series 1: TOF fat-sat · axial · 0.8mm · 0.38mm/px · z∈[-43,+56]mm · 14 of 131 slices shown]
[im 1/131]
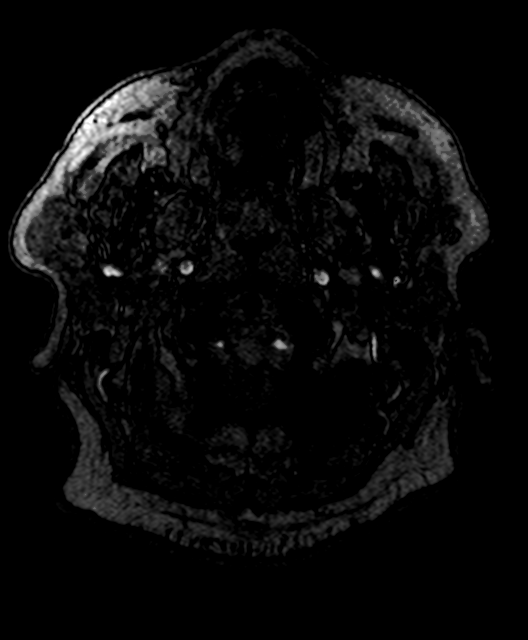
[im 3/131]
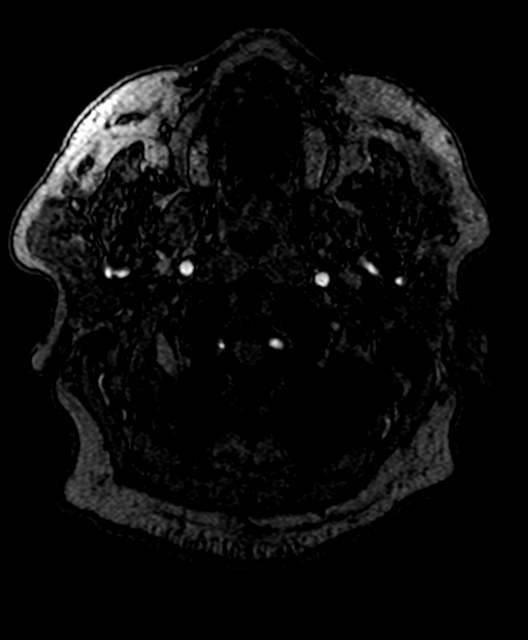
[im 6/131]
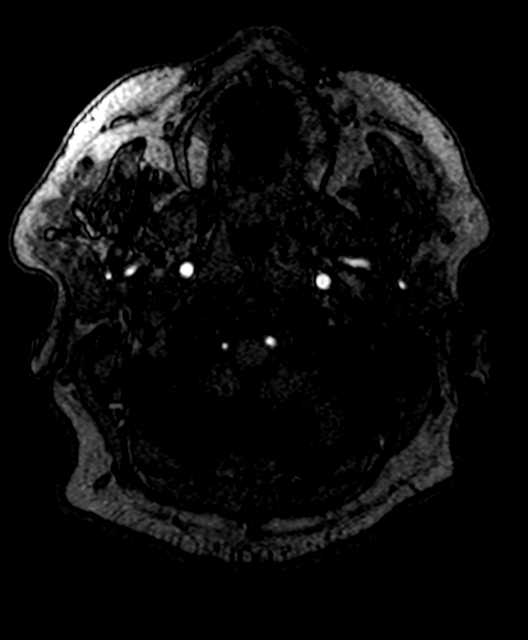
[im 9/131]
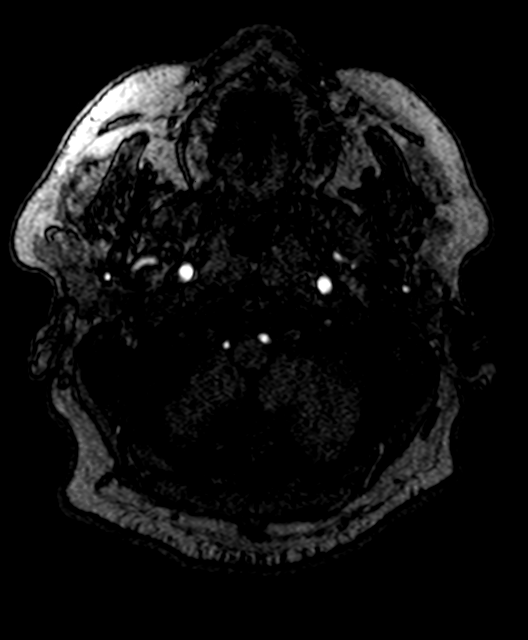
[im 23/131]
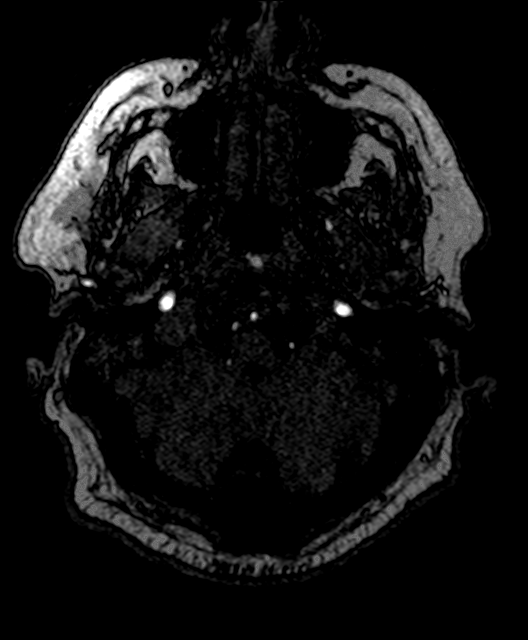
[im 25/131]
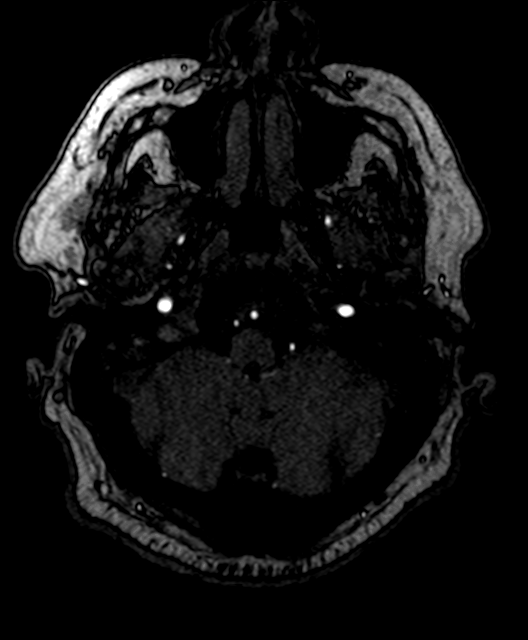
[im 42/131]
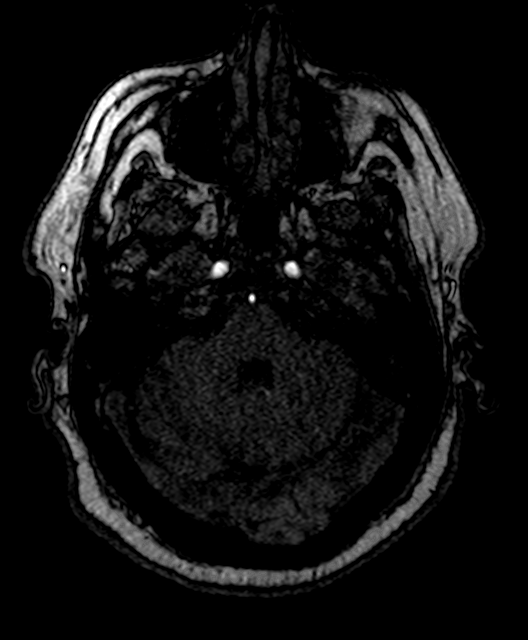
[im 59/131]
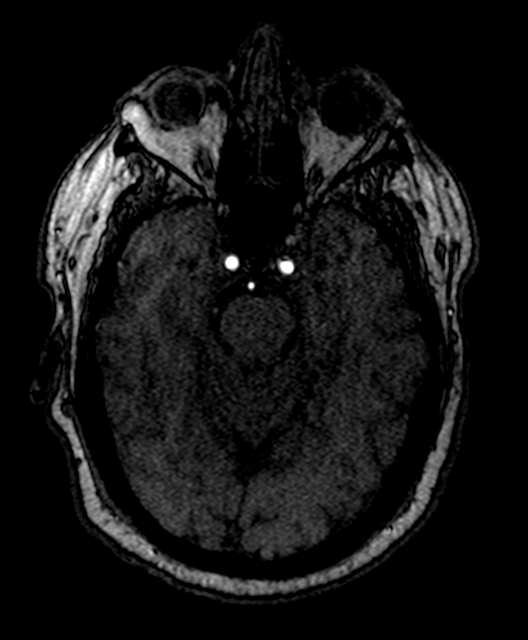
[im 67/131]
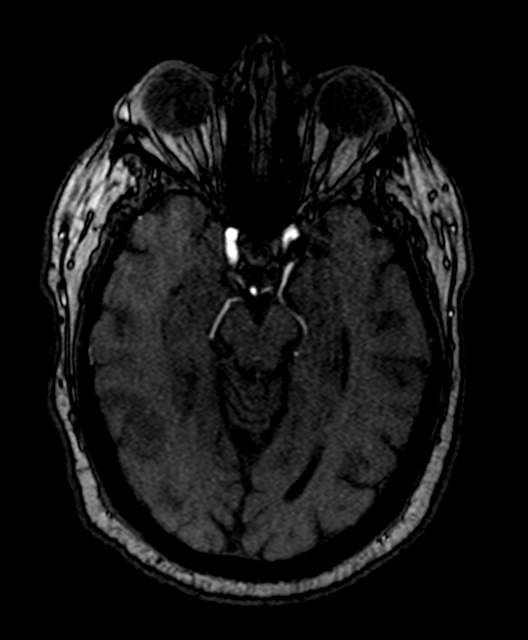
[im 75/131]
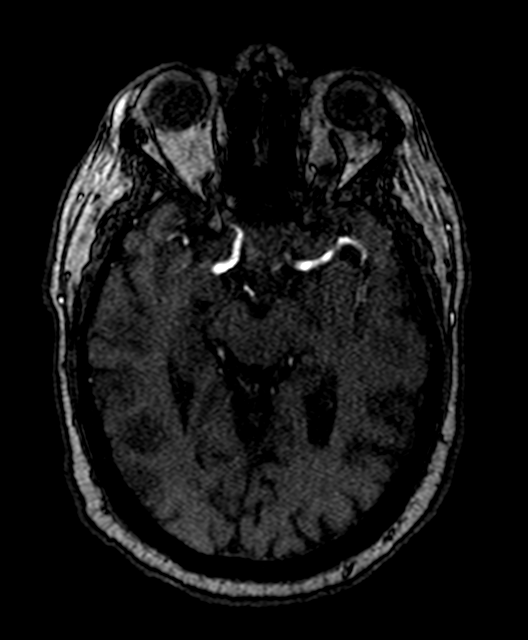
[im 92/131]
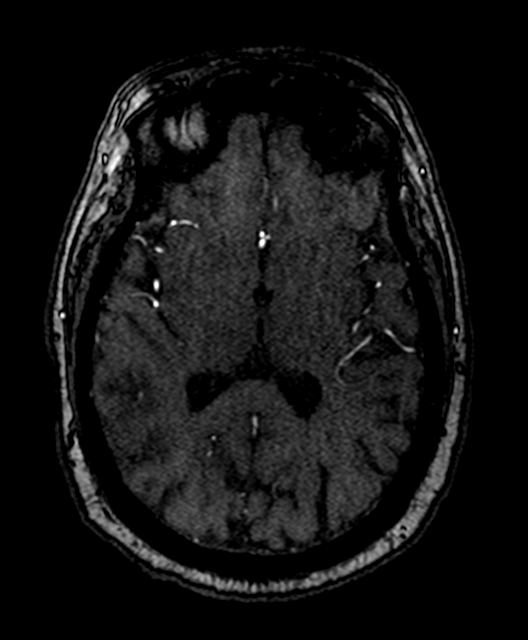
[im 108/131]
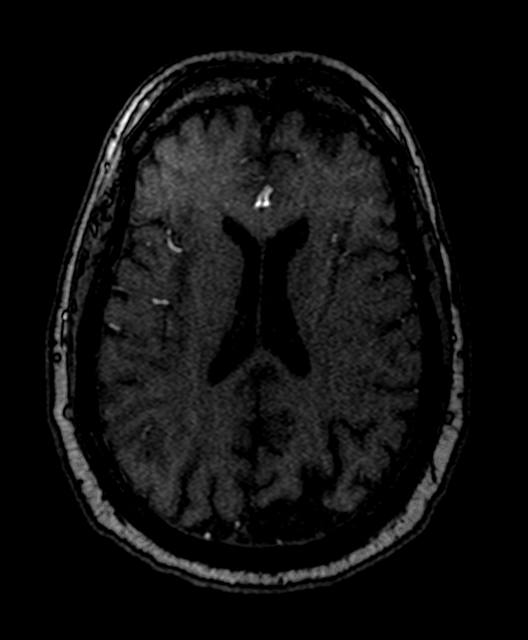
[im 111/131]
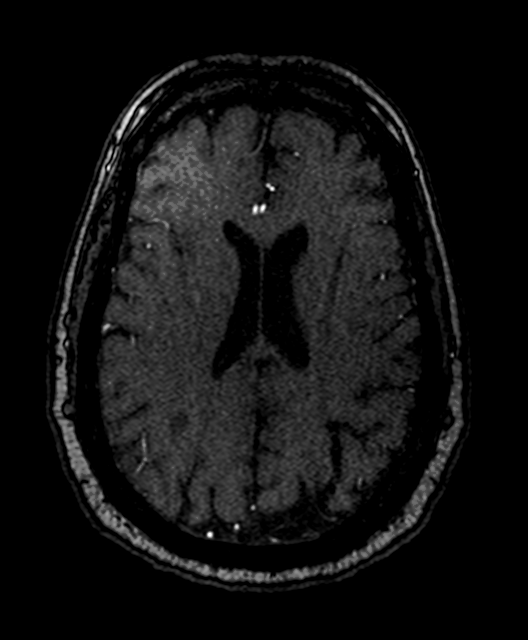
[im 125/131]
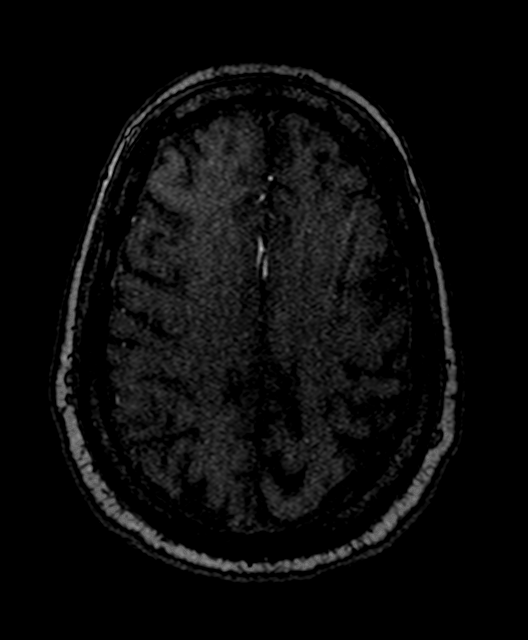

[14 of 48 positions shown; findings below may reference images not displayed]

FINDINGS: MRI HEAD FINDINGS

Brain:

Again demonstrated is a moderate-sized acute/early subacute
cortical/subcortical infarct within the posterior right MCA vascular
territory involving portions of the right parietal, occipital and
temporal lobes. There is a separate 13 mm acute/early subacute
cortical infarct within the high posterior right parietal lobe.
There are several additional scattered punctate acute/early subacute
cortical infarcts within the right frontoparietal and right temporal
lobes. Corresponding T2/FLAIR hyperintensity at these sites.
Susceptibility weighted signal loss consistent with petechial
hemorrhage in the region of the dominant infarct. There is a
background of mild chronic small vessel ischemic disease.

No evidence of intracranial mass. No midline shift or extra-axial
fluid collection. Mild generalized parenchymal atrophy.

Vascular: Reported separately

Skull and upper cervical spine: No focal marrow lesion

Sinuses/Orbits: Visualized orbits demonstrate no acute abnormality.
Trace ethmoid sinus mucosal thickening. Small bilateral mastoid
effusions. Incidentally noted Tornwaldt cyst.

MRA HEAD FINDING:

The intracranial internal carotid arteries are patent without
significant proximal stenosis.

The bilateral middle and anterior cerebral arteries are patent. Mild
focal stenosis within the A1 right anterior cerebral artery.
Otherwise no significant stenosis within these vessels.

No intracranial aneurysm is identified.

The non dominant intracranial right vertebral artery is patent and
terminates as the right PICA. The left vertebral artery is patent
without significant stenosis and supplies the basilar artery. The
basilar artery is patent without significant stenosis. Predominantly
fetal origin of the left posterior cerebral artery. There are sites
of apparent mild to moderate segmental stenosis within the bilateral
P2 posterior cerebral arteries. There is a sizable right posterior
communicating artery.

These results were called by telephone at the time of interpretation
on 01/31/2019 at [DATE] to provider MELITON TIGER , who verbally
acknowledged these results.
IMPRESSION: MRI brain:

1. Redemonstrated moderate-sized acute/early subacute
cortical/subcortical infarct within the posterior right MCA vascular
territory. Additional small acute/early subacute cortical infarct
within the right parietal lobe, as well as multiple punctate
acute/early subacute cortical infarcts within the right
frontoparietal and right temporal lobes. Petechial hemorrhage in the
region of the dominant infarct. Question an embolic source within
the cervical right ICA. Correlate with findings on scheduled carotid
artery duplex.
2. Mild generalized parenchymal atrophy and chronic small vessel
ischemic disease.
3. Small bilateral mastoid effusions.

MRA head:

1. No intracranial anterior circulation large vessel occlusion or
proximal high-grade arterial stenosis. Mild focal stenosis within
the A1 right anterior cerebral artery.
2. Atherosclerotic irregularity of the posterior cerebral arteries
with sites of apparent mild to moderate segmental stenosis within
the P2 segments bilaterally.

## 2021-02-25 ENCOUNTER — Ambulatory Visit: Payer: Medicare HMO

## 2021-02-26 ENCOUNTER — Other Ambulatory Visit: Payer: Self-pay

## 2021-02-26 ENCOUNTER — Ambulatory Visit: Payer: Medicare HMO

## 2021-02-26 DIAGNOSIS — E1159 Type 2 diabetes mellitus with other circulatory complications: Secondary | ICD-10-CM | POA: Diagnosis not present

## 2021-02-26 DIAGNOSIS — M2011 Hallux valgus (acquired), right foot: Secondary | ICD-10-CM | POA: Diagnosis not present

## 2021-02-26 DIAGNOSIS — M2012 Hallux valgus (acquired), left foot: Secondary | ICD-10-CM | POA: Diagnosis not present

## 2021-02-26 DIAGNOSIS — E1142 Type 2 diabetes mellitus with diabetic polyneuropathy: Secondary | ICD-10-CM | POA: Diagnosis not present

## 2021-02-26 NOTE — Progress Notes (Signed)
SITUATION Reason for Visit: Fitting of Diabetic Shoes & Insoles Patient / Caregiver Report:  Patient is satisfied with shoes and insoles  OBJECTIVE DATA: Patient History / Diagnosis:  Diabetes mellitus Change in Status:   None  ACTIONS PERFORMED: In-Person Delivery, patient was fit with: - 1x pair [id number] PDAC approved prefabricated Diabetic Shoes: New Balance Model 813 Lace Black 11-4E - 3x pair [phone] PDAC approved CAM milled custom diabetic insoles  Shoes and insoles were verified for structural integrity and safety. Patient wore shoes and insoles in office. Skin was inspected and free of areas of concern after wearing shoes and inserts. Shoes and inserts fit properly. Patient / Caregiver provided with ferbal instruction and demonstration regarding donning, doffing, wear, care, proper fit, function, purpose, cleaning, and use of shoes and insoles ' and in all related precautions and risks and benefits regarding shoes and insoles. Patient / Caregiver was instructed to wear properly fitting socks with shoes at all times. Patient was also provided with verbal instruction regarding how to report any failures or malfunctions of shoes or inserts, and necessary follow up care. Patient / Caregiver was also instructed to contact physician regarding change in status that may affect function of shoes and inserts.   Patient / Caregiver verbalized undersatnding of instruction provided. Patient / Caregiver demonstrated independence with proper donning and doffing of shoes and inserts.  PLAN Patient to follow up as needed. Plan of care was discussed with and agreed upon by patient and/or caregiver. All questions were answered and concerns addressed.

## 2021-03-07 ENCOUNTER — Other Ambulatory Visit: Payer: Medicare HMO

## 2021-03-13 DIAGNOSIS — E1165 Type 2 diabetes mellitus with hyperglycemia: Secondary | ICD-10-CM | POA: Diagnosis not present

## 2021-03-19 DIAGNOSIS — H5200 Hypermetropia, unspecified eye: Secondary | ICD-10-CM | POA: Diagnosis not present

## 2021-03-19 DIAGNOSIS — Z0100 Encounter for examination of eyes and vision without abnormal findings: Secondary | ICD-10-CM | POA: Diagnosis not present

## 2021-04-10 DIAGNOSIS — E1165 Type 2 diabetes mellitus with hyperglycemia: Secondary | ICD-10-CM | POA: Diagnosis not present

## 2021-04-10 DIAGNOSIS — E7800 Pure hypercholesterolemia, unspecified: Secondary | ICD-10-CM | POA: Diagnosis not present

## 2021-04-13 DIAGNOSIS — E1165 Type 2 diabetes mellitus with hyperglycemia: Secondary | ICD-10-CM | POA: Diagnosis not present

## 2021-04-14 ENCOUNTER — Other Ambulatory Visit: Payer: Self-pay

## 2021-04-14 ENCOUNTER — Ambulatory Visit: Payer: Medicare HMO | Admitting: Podiatry

## 2021-04-14 ENCOUNTER — Encounter: Payer: Self-pay | Admitting: Podiatry

## 2021-04-14 DIAGNOSIS — E1142 Type 2 diabetes mellitus with diabetic polyneuropathy: Secondary | ICD-10-CM | POA: Diagnosis not present

## 2021-04-14 DIAGNOSIS — M79674 Pain in right toe(s): Secondary | ICD-10-CM

## 2021-04-14 DIAGNOSIS — B351 Tinea unguium: Secondary | ICD-10-CM

## 2021-04-14 DIAGNOSIS — M79675 Pain in left toe(s): Secondary | ICD-10-CM

## 2021-04-14 NOTE — Progress Notes (Signed)
This patient returns to my office for at risk foot care.  This patient requires this care by a professional since this patient will be at risk due to having Renal insufficiency, CVA, PAD, T2DM.  He is taking plavix.   This patient is unable to cut nails himself since the patient cannot reach his nails.These nails are painful walking and wearing shoes.  This patient presents for at risk foot care today.  General Appearance  Alert, conversant and in no acute stress.  Vascular  Dorsalis pedis and posterior tibial  pulses are absent  bilaterally.  Capillary return is within normal limits  bilaterally. Temperature is within normal limits  bilaterally.  Neurologic  Senn-Weinstein monofilament wire test absent  bilaterally. Muscle power within normal limits bilaterally.  Nails Thick disfigured discolored nails with subungual debris  from hallux to fifth toes bilaterally. No evidence of bacterial infection or drainage bilaterally.  Orthopedic  No limitations of motion  feet .  No crepitus or effusions noted.  No bony pathology or digital deformities noted.HAV  B/L.  DJD midfoot  B/L.    Skin  normotropic skin noted bilaterally.  No signs of infections or ulcers noted.   Asymptomatic callus sub 5 left.  Patient used to have ulcers at these sites.  Onychomycosis  Pain in right toes  Pain in left toes Diabetes wth angiopathy and neurologic deficit.  Consent was obtained for treatment procedures.   Mechanical debridement of nails 1-5  bilaterally performed with a nail nipper.  Filed with dremel without incident. Patient to be scheduled for diabetic orthoses/diabetic shoes.     Return office visit   3 months                   Told patient to return for periodic foot care and evaluation due to potential at risk complications.   Dashon Mcintire DPM   

## 2021-04-16 DIAGNOSIS — E559 Vitamin D deficiency, unspecified: Secondary | ICD-10-CM | POA: Diagnosis not present

## 2021-04-16 DIAGNOSIS — E1121 Type 2 diabetes mellitus with diabetic nephropathy: Secondary | ICD-10-CM | POA: Diagnosis not present

## 2021-04-16 DIAGNOSIS — I10 Essential (primary) hypertension: Secondary | ICD-10-CM | POA: Diagnosis not present

## 2021-04-16 DIAGNOSIS — E782 Mixed hyperlipidemia: Secondary | ICD-10-CM | POA: Diagnosis not present

## 2021-04-16 DIAGNOSIS — R011 Cardiac murmur, unspecified: Secondary | ICD-10-CM | POA: Diagnosis not present

## 2021-04-16 DIAGNOSIS — Z951 Presence of aortocoronary bypass graft: Secondary | ICD-10-CM | POA: Diagnosis not present

## 2021-04-16 DIAGNOSIS — Z23 Encounter for immunization: Secondary | ICD-10-CM | POA: Diagnosis not present

## 2021-04-18 DIAGNOSIS — E1140 Type 2 diabetes mellitus with diabetic neuropathy, unspecified: Secondary | ICD-10-CM | POA: Diagnosis not present

## 2021-04-18 DIAGNOSIS — I2510 Atherosclerotic heart disease of native coronary artery without angina pectoris: Secondary | ICD-10-CM | POA: Diagnosis not present

## 2021-04-18 DIAGNOSIS — E1165 Type 2 diabetes mellitus with hyperglycemia: Secondary | ICD-10-CM | POA: Diagnosis not present

## 2021-04-18 DIAGNOSIS — I10 Essential (primary) hypertension: Secondary | ICD-10-CM | POA: Diagnosis not present

## 2021-04-18 DIAGNOSIS — N189 Chronic kidney disease, unspecified: Secondary | ICD-10-CM | POA: Diagnosis not present

## 2021-04-18 DIAGNOSIS — I639 Cerebral infarction, unspecified: Secondary | ICD-10-CM | POA: Diagnosis not present

## 2021-04-18 DIAGNOSIS — E7800 Pure hypercholesterolemia, unspecified: Secondary | ICD-10-CM | POA: Diagnosis not present

## 2021-05-14 DIAGNOSIS — E1165 Type 2 diabetes mellitus with hyperglycemia: Secondary | ICD-10-CM | POA: Diagnosis not present

## 2021-05-26 ENCOUNTER — Other Ambulatory Visit: Payer: Self-pay | Admitting: Physician Assistant

## 2021-06-02 NOTE — Progress Notes (Signed)
Cardiology Office Note:    Date:  06/04/2021   ID:  [name], DOB 1951/02/20, MRN [id number]  PCP:  Celene Squibb, MD  [facility] Providers Cardiologist:  Sherren Mocha, MD Cardiology APP:  Sharmon Revere     Referring MD: Celene Squibb, MD   Chief Complaint:  Follow-up for CAD    Patient Profile: Coronary artery disease  S/p CABG in 2009 Cath 6/21: patent grafts, Med Rx  Hypertension  Hyperlipidemia  Diabetes mellitus  Hx of diabetic foot ulcer PAD  eval by Dr. Fletcher Anon in 2019 >> small vessel dz >> med Rx  S/p cryptogenic CVA 01/2019 2 vascular territories (R temporoparietal and occipital) - ?cardioembolic  Event monitor done by PCP - No AFib  Pt declined ref to EP for ILR  Prior CV Studies: Cardiac catheterization October 12, 2019 LAD occluded LCx occluded Subtotal calcific occlusion of the RCA and PDA L-LAD patent S-OM/R PLA patent S-RPDA patent S-Dx patent EF 60   Event monitor 02/2019 Sinus rhythm, HR 48-99 No atrial fibrillation   Echocardiogram 02/01/2019 EF 60-65, mod LVH, mildly reduced RVSF, trace MR, mild TR   Carotid US 01/31/2019 IMPRESSION: Color duplex indicates minimal heterogeneous and calcified plaque, with no hemodynamically significant stenosis by duplex criteria in the extracranial cerebrovascular circulation.   Echocardiogram 10/29/2017 Mild LVH, EF 64-68, normal diastolic function, trivial AI, moderate LAE   Nuclear stress test 10/29/2017 EF 50, fixed septal/anteroseptal, lateral/inferolateral defect suggesting artifact, no ischemia, low risk   ETT-Myoview (03/14/13):   Ex 9:00, ECG with 1 mm ST depression inferiorly, primarily reversible medium-sized anteroseptal defect, EF 54% (intermediate risk).    Carotid US (04/2012):   Mild plaque bilaterally (0-39%) => repeat in 1 year.    History of Present Illness:   [name] is a 71 y.o. male with the above problem list.  He was last seen in 9/22.  He returns for f/u.  He  is here alone.  He has been doing well without chest symptoms, syncope, orthopnea.  He has dependent leg edema.  He does have compression hose at home and will try to use those.  He has noted some shortness of breath with exertion.  However, this has improved the more active he is.  He has had muscle pains with taking rosuvastatin.  This clearly resolved when he stopped it.          Past Medical History:  Diagnosis Date   Allergy    CAD (coronary artery disease), native coronary artery    Multiple PCI procedures followed by CABG in 2009 with LIMA-LAD, SVG-D, seq SVG-OM and PLV, SVG-PDA. Echo (3/12): EF 60-65%, mild MR. // Nuc Stress Test 7/19:  EF 50, artifact, no ischemia, low risk    Carotid artery disease ([place]) 01/31/2017   dopplers 1/14:  0-39% bilat   Carotid stenosis    dopplers 1/14:  0-39% bilat   Cataract    REMOVED   Colon polyps 2006   Colonoscopy-Dr. Patterson(Tubular Adenoma)    Coronary Artery Disease 06/03/2008   Annotation: S/P CABG 2009 Qualifier: Diagnosis of  By: Olevia Perches, MD, Glenetta Hew    DM2 (diabetes mellitus, type 2) ([place])    Followed by Dr. Carlis Abbott   Essential hypertension 06/03/2008   Qualifier: Diagnosis of  By: Olevia Perches, MD, Glenetta Hew    History of echocardiogram    Echo 7/19: Mild LVH, EF 03-21, normal diastolic function, trivial AI, moderate LAE   HLD (hyperlipidemia)  HTN (hypertension)    Hx of cardiovascular stress test    ETT-Myoview (03/2013):  Anteroseptal reversible defect, EF 54%, intermediate risk.   Hyperlipidemia 06/03/2008   Qualifier: Diagnosis of  By: Olevia Perches, MD, Glenetta Hew    Ischemic stroke [facility]) 01/30/2019   OSA (obstructive sleep apnea) 08/25/2011   PAD (peripheral artery disease) ([place]) 12/10/2017   Postoperative atrial fibrillation ([place]) 2009   Renal insufficiency 01/31/2019   Stroke [facility])    Type 2 diabetes mellitus with complication, with long-term current use of insulin ([place]) 06/03/2008   Qualifier: Diagnosis of   By: Olevia Perches, MD, Glenetta Hew    Ulcer    left foot   Varicose veins of lower extremities with other complications 11/05/8001   Current Medications: Current Meds  Medication Sig   amitriptyline (ELAVIL) 75 MG tablet Take 75 mg by mouth at bedtime.   amLODipine (NORVASC) 10 MG tablet TAKE 1 TABLET BY MOUTH EVERY DAY   aspirin 81 MG EC tablet Take 1 tablet (81 mg total) by mouth daily.   clopidogrel (PLAVIX) 75 MG tablet TAKE 1 TABLET BY MOUTH EVERY DAY   ezetimibe (ZETIA) 10 MG tablet TAKE 1 TABLET BY MOUTH EVERY DAY   fenofibrate 54 MG tablet TAKE 1 TABLET BY MOUTH EVERY DAY   FLUZONE HIGH-DOSE QUADRIVALENT 0.7 ML SUSY    furosemide (LASIX) 40 MG tablet Take 1 tablet (40 mg total) by mouth daily. Pt needs to keep upcoming appt in Mar for further refills   glucose monitoring kit (FREESTYLE) monitoring kit 1 each by Does not apply route as needed for other. Freestyle Libre   isosorbide mononitrate (IMDUR) 30 MG 24 hr tablet Take 1.5 tablets (45 mg total) by mouth daily. Pt needs to keep upcoming appt in Mar for further refills   metFORMIN (GLUCOPHAGE) 1000 MG tablet Take 1,000 mg by mouth 2 (two) times daily with a meal.   metoprolol tartrate (LOPRESSOR) 100 MG tablet TAKE 1 TABLET BY MOUTH TWICE A DAY   MODERNA COVID-19 BIVAL BOOSTER 50 MCG/0.5ML injection    nitroGLYCERIN (NITROSTAT) 0.4 MG SL tablet PLACE 1 TABLET UNDER THE TONGUE EVERY 5 MINUTES AS NEEDED FOR CHEST PAIN   NOVOLOG MIX 70/30 FLEXPEN (70-30) 100 UNIT/ML Pen Inject 40-60 Units into the skin 2 (two) times daily. Per sliding scale   OZEMPIC, 0.25 OR 0.5 MG/DOSE, 2 MG/1.5ML SOPN Inject 1 unit into the skin per week   [DISCONTINUED] rosuvastatin (CRESTOR) 20 MG tablet TAKE 1 TABLET BY MOUTH EVERY DAY    Allergies:   Metformin and related and Penicillins   Social History   Tobacco Use   Smoking status: Never   Smokeless tobacco: Former    Types: Chew    Quit date: 04/06/1993  Vaping Use   Vaping Use: Never used   Substance Use Topics   Alcohol use: No   Drug use: No    Family Hx: The patient's family history includes Cancer in his mother; Colon cancer in his mother; Coronary artery disease in an other family member; Diabetes in his brother, father, and mother; Heart disease in his father, mother, and another family member; Hyperlipidemia in his father and mother; Hypertension in his father; Other in his brother and brother; Prostate cancer in his brother and father.  Review of Systems  Musculoskeletal:  Positive for myalgias.       Bilateral hand numbness (carpal tunnel syndrome symptoms)    EKGs/Labs/Other Test Reviewed:    EKG:  EKG is not ordered  today.    Recent Labs: No results found for requested labs within last 8760 hours.   Recent Lipid Panel No results for input(s): CHOL, TRIG, HDL, VLDL, LDLCALC, LDLDIRECT in the last 8760 hours.  Labs from primary care dated 04/10/2021: ALT 20, creatinine 1.37, K+ 4.9, A1c 8, total cholesterol 231, LDL 135, triglycerides 295,  Risk Assessment/Calculations:         Physical Exam:    VS:  BP (!) 110/58 (BP Location: Left Arm, Patient Position: Sitting, Cuff Size: Normal)    Pulse 68    Wt 245 lb 9.6 oz (111.4 kg)    SpO2 96%    BMI 35.24 kg/m     Wt Readings from Last 3 Encounters:  06/04/21 245 lb 9.6 oz (111.4 kg)  12/20/20 244 lb (110.7 kg)  09/09/20 237 lb (107.5 kg)    Constitutional:      Appearance: Healthy appearance. Not in distress.  Neck:     Vascular: No JVR. JVD normal.  Pulmonary:     Effort: Pulmonary effort is normal.     Breath sounds: No wheezing. No rales.  Cardiovascular:     Normal rate. Regular rhythm. Normal S1. Normal S2.      Murmurs: There is no murmur.  Edema:    Peripheral edema present.    Pretibial: bilateral 1+ edema of the pretibial area. Abdominal:     Palpations: Abdomen is soft.  Skin:    General: Skin is warm and dry.  Neurological:     General: No focal deficit present.     Mental Status:  Alert and oriented to person, place and time.     Cranial Nerves: Cranial nerves are intact.        ASSESSMENT & PLAN:   Coronary artery disease involving native coronary artery of native heart with angina pectoris ([place]) History of CABG in 2009.  Cardiac catheterization 2021 with patent grafts.  He is doing well without anginal symptoms.  Continue amlodipine 10 mg daily, aspirin 81 mg daily, clopidogrel 75 mg daily, ezetimibe 10 mg daily, isosorbide mononitrate 45 mg daily, metoprolol tartrate 100 mg twice daily.  Follow-up in 1 year.  Hyperlipidemia LDL goal <70 LDL is above goal.  These labs were checked when he was off of rosuvastatin.  He has tried several statins and has been unable to tolerate due to myopathy.  Continue ezetimibe 10 mg daily, fenofibrate 54 mg daily.  Referred to Pharm.D. clinic for consideration of PCSK9 inhibitor.  Essential hypertension The patient's blood pressure is controlled on his current regimen.  Continue current therapy.   Type 2 diabetes mellitus with vascular disease ([place]) Managed by endocrinology.  Venous insufficiency I have recommended using compression hose.          Dispo:  Return in about 1 year (around 06/05/2022) for Routine Follow Up, w/ [name], PA-C.   Medication Adjustments/Labs and Tests Ordered: Current medicines are reviewed at length with the patient today.  Concerns regarding medicines are outlined above.  Tests Ordered: Orders Placed This Encounter  Procedures   AMB Referral to Heartcare Pharm-D   Medication Changes: Meds ordered this encounter  Medications   rosuvastatin (CRESTOR) 20 MG tablet    Sig: Take 0.5 tablets (10 mg total) by mouth daily.    Dispense:  90 tablet    Refill:  3   Signed, [name], PA-C  06/04/2021 5:42 PM    [facility] [address], [place], [place]  [address]  Phone: [phone]; Fax: [phone]

## 2021-06-04 ENCOUNTER — Encounter: Payer: Self-pay | Admitting: Physician Assistant

## 2021-06-04 ENCOUNTER — Ambulatory Visit: Payer: Medicare HMO | Admitting: Physician Assistant

## 2021-06-04 ENCOUNTER — Other Ambulatory Visit: Payer: Self-pay

## 2021-06-04 VITALS — BP 110/58 | HR 68 | Wt 245.6 lb

## 2021-06-04 DIAGNOSIS — E1159 Type 2 diabetes mellitus with other circulatory complications: Secondary | ICD-10-CM | POA: Diagnosis not present

## 2021-06-04 DIAGNOSIS — I10 Essential (primary) hypertension: Secondary | ICD-10-CM

## 2021-06-04 DIAGNOSIS — I739 Peripheral vascular disease, unspecified: Secondary | ICD-10-CM | POA: Diagnosis not present

## 2021-06-04 DIAGNOSIS — I25119 Atherosclerotic heart disease of native coronary artery with unspecified angina pectoris: Secondary | ICD-10-CM | POA: Diagnosis not present

## 2021-06-04 DIAGNOSIS — I872 Venous insufficiency (chronic) (peripheral): Secondary | ICD-10-CM | POA: Diagnosis not present

## 2021-06-04 DIAGNOSIS — E785 Hyperlipidemia, unspecified: Secondary | ICD-10-CM | POA: Diagnosis not present

## 2021-06-04 MED ORDER — ROSUVASTATIN CALCIUM 20 MG PO TABS: 10.0000 mg | ORAL_TABLET | Freq: Every day | ORAL | 3 refills | Status: DC

## 2021-06-04 NOTE — Assessment & Plan Note (Signed)
The patient's blood pressure is controlled on his current regimen.  Continue current therapy.  

## 2021-06-04 NOTE — Assessment & Plan Note (Signed)
Managed by endocrinology.

## 2021-06-04 NOTE — Assessment & Plan Note (Signed)
History of CABG in 2009.  Cardiac catheterization 2021 with patent grafts.  He is doing well without anginal symptoms.  Continue amlodipine 10 mg daily, aspirin 81 mg daily, clopidogrel 75 mg daily, ezetimibe 10 mg daily, isosorbide mononitrate 45 mg daily, metoprolol tartrate 100 mg twice daily.  Follow-up in 1 year. ?

## 2021-06-04 NOTE — Patient Instructions (Signed)
Medication Instructions:  ? ?DECREASE Crestor one half tablet ( 10 mg) daily.  ? ? ?*If you need a refill on your cardiac medications before your next appointment, please call your pharmacy* ? ? ?Follow-Up: ?At [facility], you and your health needs are our priority.  As part of our continuing mission to provide you with exceptional heart care, we have created designated Provider Care Teams.  These Care Teams include your primary Cardiologist (physician) and Advanced Practice Providers (APPs -  Physician Assistants and Nurse Practitioners) who all work together to provide you with the care you need, when you need it. ? ?We recommend signing up for the patient portal called "MyChart".  Sign up information is provided on this After Visit Summary.  MyChart is used to connect with patients for Virtual Visits (Telemedicine).  Patients are able to view lab/test results, encounter notes, upcoming appointments, etc.  Non-urgent messages can be sent to your provider as well.   ?To learn more about what you can do with MyChart, go to [email or web address].   ? ?Your next appointment:   ?1 year(s) ? ?The format for your next appointment:   ?In Person ? ?Provider:   ?Tereso Newcomer, PA-C       ? ? ?Other Instructions ? ?You have been referred to the lipid clinic.  ?  ?

## 2021-06-04 NOTE — Assessment & Plan Note (Signed)
I have recommended using compression hose. ?

## 2021-06-04 NOTE — Assessment & Plan Note (Signed)
LDL is above goal.  These labs were checked when he was off of rosuvastatin.  He has tried several statins and has been unable to tolerate due to myopathy.  Continue ezetimibe 10 mg daily, fenofibrate 54 mg daily.  Referred to Pharm.D. clinic for consideration of PCSK9 inhibitor. ?

## 2021-06-11 DIAGNOSIS — E1165 Type 2 diabetes mellitus with hyperglycemia: Secondary | ICD-10-CM | POA: Diagnosis not present

## 2021-06-14 DIAGNOSIS — E1165 Type 2 diabetes mellitus with hyperglycemia: Secondary | ICD-10-CM | POA: Diagnosis not present

## 2021-06-24 ENCOUNTER — Other Ambulatory Visit: Payer: Self-pay | Admitting: Cardiovascular Disease

## 2021-06-27 ENCOUNTER — Ambulatory Visit: Payer: Medicare HMO | Admitting: Pharmacist

## 2021-06-27 ENCOUNTER — Other Ambulatory Visit: Payer: Self-pay

## 2021-06-27 DIAGNOSIS — I25119 Atherosclerotic heart disease of native coronary artery with unspecified angina pectoris: Secondary | ICD-10-CM | POA: Diagnosis not present

## 2021-06-27 DIAGNOSIS — E785 Hyperlipidemia, unspecified: Secondary | ICD-10-CM | POA: Diagnosis not present

## 2021-06-27 MED ORDER — ROSUVASTATIN CALCIUM 5 MG PO TABS: 5.0000 mg | ORAL_TABLET | Freq: Every day | ORAL | 3 refills | Status: DC

## 2021-06-27 NOTE — Patient Instructions (Addendum)
Start taking rosuvastatin 5mg  daily ?You may start CoQ10 200mg  daily if you would like ?Lab work on 6/16. Lab opens at 7:15. Please come fasting. ?

## 2021-06-27 NOTE — Progress Notes (Signed)
Patient ID: [name]                 DOB: 07/18/1950                    MRN: [id number] ? ? ? ? ?HPI: ?[name] is a 71 y.o. male patient of Dr. Burt Knack referred to lipid clinic by [name], PA. PMH is significant for CAD s/p CABG in 2009, HTN, DM, PAD, CVA in 2020.  ? ?Patient stopped taking his rosuvastatin 37m daily about 2.5 months ago. His joint pain improved. He resumed rosuvastatin and his pain worsened again per his report.  ? ?Patient presents to lipid clinic today. He has a very strong family and personal history of CAD. He has been trying to walk after dinner with his wife. He notices that it improves his blood sugars a lot. He has joint pain at baseline. Felt like statins made it worse. 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 ?Current Medications: ezetimibe 174mdaily, fenofibrate 5446maily ?Intolerances: rosuvastatin 31m66mily, rosuvastatin 20mg23mly, rosuvastatin 40mg 92my, rosuvastatin 5mg da78m, pravastatin 80mg, p54mstatin 40mg (ac50mpains in joints), atorvastatin ?Risk Factors: CAD, PAD, DM, CVA ?LDL goal: <55 ? ?Diet: drinks: some water, usually diet soda, zero sugar mix for water ? ?Exercise: hunts in the fall, works in the yard, trying to walk, senior center for stretching ? ?Family History:  ?Family History  ?Problem Relation Age of Onset  ? Coronary artery disease Other   ?     family history  ? Heart disease Other   ?     family history  ? Other Brother   ?     CABG  ? Other Brother   ?     CABG  ? Prostate cancer Father   ? Diabetes Father   ? Heart disease Father   ? Hyperlipidemia Father   ? Hypertension Father   ? Prostate cancer Brother   ? Diabetes Brother   ?     x 2  ? Colon cancer Mother   ? Cancer Mother   ? Diabetes Mother   ? Heart disease Mother   ? Hyperlipidemia Mother   ? ? ? ?Social History: no tobacco, no ETOH, no illicit drugs currently ? ?Labs:  ?04/10/21 TC 231, 295 HDL 37 LDL-C 135 (ezetimibe 31mg dail42m9/7/22 TC 98, TG 236,  HDL 32, LDL-C 30 (rosuvastatin 31mg and e25mmibe 31mg daily)64mPast Medical History:  ?Diagnosis Date  ? Allergy   ? CAD (coronary artery disease), native coronary artery   ? Multiple PCI procedures followed by CABG in 2009 with LIMA-LAD, SVG-D, seq SVG-OM and PLV, SVG-PDA. Echo (3/12): EF 60-65%, mild MR. // Nuc Stress Test 7/19:  EF 50, artifact, no ischemia, low risk   ? Carotid artery disease (H[facility]) 10/28/2[place]  ? dopplers 1/14:  0-39% bilat  ? Carotid stenosis   ? dopplers 1/14:  0-39% bilat  ? Cataract   ? REMOVED  ? Colon polyps 2006  ? Colonoscopy-Dr. Patterson(Tubular Adenoma)   ? Coronary Artery Disease 06/03/2008  ? Annotation: S/P CABG 2009 Qualifier: Diagnosis of  By: Brodie, MD, Olevia PerchesBruce Glenetta Hewabetes mellitus, type 2) (HCC)   ? [place] by Dr. Clark  ? EssCarlis Abbottl hypertension 06/03/2008  ? Qualifier: Diagnosis of  By: Brodie, MD, Olevia PerchesBruce Glenetta Hew of echocardiogram   ? Echo 7/19: Mild LVH, EF 55-60, norma70-35stolic  function, trivial AI, moderate LAE  ? HLD (hyperlipidemia)   ? HTN (hypertension)   ? Hx of cardiovascular stress test   ? ETT-Myoview (03/2013):  Anteroseptal reversible defect, EF 54%, intermediate risk.  ? Hyperlipidemia 06/03/2008  ? Qualifier: Diagnosis of  By: Olevia Perches, MD, Glenetta Hew   ? Ischemic stroke ([place]) 01/30/2019  ? OSA (obstructive sleep apnea) 08/25/2011  ? PAD (peripheral artery disease) ([place]) 12/10/2017  ? Postoperative atrial fibrillation ([place]) 2009  ? Renal insufficiency 01/31/2019  ? Stroke [facility])   ? Type 2 diabetes mellitus with complication, with long-term current use of insulin ([place]) 06/03/2008  ? Qualifier: Diagnosis of  By: Olevia Perches, MD, Glenetta Hew   ? Ulcer   ? left foot  ? Varicose veins of lower extremities with other complications 11/05/4479  ? ? ?Current Outpatient Medications on File Prior to Visit  ?Medication Sig Dispense Refill  ? amitriptyline (ELAVIL) 75 MG tablet Take 75 mg by mouth at bedtime.    ? amLODipine (NORVASC) 10  MG tablet TAKE 1 TABLET BY MOUTH EVERY DAY 90 tablet 3  ? aspirin 81 MG EC tablet Take 1 tablet (81 mg total) by mouth daily. 90 tablet 3  ? clopidogrel (PLAVIX) 75 MG tablet TAKE 1 TABLET BY MOUTH EVERY DAY 90 tablet 3  ? ezetimibe (ZETIA) 10 MG tablet TAKE 1 TABLET BY MOUTH EVERY DAY 90 tablet 3  ? fenofibrate 54 MG tablet TAKE 1 TABLET BY MOUTH EVERY DAY 90 tablet 3  ? FLUZONE HIGH-DOSE QUADRIVALENT 0.7 ML SUSY     ? furosemide (LASIX) 40 MG tablet Take 1 tablet (40 mg total) by mouth daily. Pt needs to keep upcoming appt in Mar for further refills 90 tablet 0  ? glucose monitoring kit (FREESTYLE) monitoring kit 1 each by Does not apply route as needed for other. Freestyle Libre    ? isosorbide mononitrate (IMDUR) 30 MG 24 hr tablet Take 1.5 tablets (45 mg total) by mouth daily. 135 tablet 3  ? metFORMIN (GLUCOPHAGE) 1000 MG tablet Take 1,000 mg by mouth 2 (two) times daily with a meal.    ? metoprolol tartrate (LOPRESSOR) 100 MG tablet TAKE 1 TABLET BY MOUTH TWICE A DAY 180 tablet 3  ? MODERNA COVID-19 BIVAL BOOSTER 50 MCG/0.5ML injection     ? nitroGLYCERIN (NITROSTAT) 0.4 MG SL tablet PLACE 1 TABLET UNDER THE TONGUE EVERY 5 MINUTES AS NEEDED FOR CHEST PAIN 25 tablet 4  ? NOVOLOG MIX 70/30 FLEXPEN (70-30) 100 UNIT/ML Pen Inject 40-60 Units into the skin 2 (two) times daily. Per sliding scale    ? OZEMPIC, 0.25 OR 0.5 MG/DOSE, 2 MG/1.5ML SOPN Inject 1 unit into the skin per week    ? rosuvastatin (CRESTOR) 20 MG tablet Take 0.5 tablets (10 mg total) by mouth daily. 90 tablet 3  ? ?No current facility-administered medications on file prior to visit.  ? ? ?Allergies  ?Allergen Reactions  ? Metformin And Related Diarrhea  ? Penicillins Other (See Comments)  ?  Other reaction(s): Other (See Comments) ?Unknown-Reaction as child ?Unknown-Reaction as child  ? ? ?Assessment/Plan: ? ?1. Hyperlipidemia - LDL-C is above goal of <55. ApoB goal <70. I discussed all options with patient including PCKS9i, Nexlizet, and trying  a new statin or low dose rosuvastatin. We talked about the cost and pros and cons of all the options. Patient wished to try rosuvastatin 67m daily for 3 months to see how he does. Labs have been scheduled for 6/16.  ?We  talked about potentially checking a LPa level to see if he qualifies for study. Patient is interested. Will check on 6/16 with the rest of his cholesterol labs. ? ?I encouraged patient to continue to walk consisently on a daily basis. Could walk 10 min 2 times a day if he cannot walk for all that long. Continue and increase stretching. ? ? ?Thank you, ? ? ?Ramond Dial, Pharm.D, BCPS, CPP ?[id number], [place], [place] [address]  ?Phone: [phone]; Fax: [phone]  ? ? ?

## 2021-07-12 DIAGNOSIS — E1165 Type 2 diabetes mellitus with hyperglycemia: Secondary | ICD-10-CM | POA: Diagnosis not present

## 2021-07-16 ENCOUNTER — Ambulatory Visit (INDEPENDENT_AMBULATORY_CARE_PROVIDER_SITE_OTHER): Payer: Medicare HMO | Admitting: Podiatry

## 2021-07-16 ENCOUNTER — Encounter: Payer: Self-pay | Admitting: Podiatry

## 2021-07-16 DIAGNOSIS — B351 Tinea unguium: Secondary | ICD-10-CM | POA: Diagnosis not present

## 2021-07-16 DIAGNOSIS — I872 Venous insufficiency (chronic) (peripheral): Secondary | ICD-10-CM

## 2021-07-16 DIAGNOSIS — M79675 Pain in left toe(s): Secondary | ICD-10-CM | POA: Diagnosis not present

## 2021-07-16 DIAGNOSIS — I739 Peripheral vascular disease, unspecified: Secondary | ICD-10-CM

## 2021-07-16 DIAGNOSIS — E1142 Type 2 diabetes mellitus with diabetic polyneuropathy: Secondary | ICD-10-CM

## 2021-07-16 DIAGNOSIS — M79674 Pain in right toe(s): Secondary | ICD-10-CM | POA: Diagnosis not present

## 2021-07-16 DIAGNOSIS — N289 Disorder of kidney and ureter, unspecified: Secondary | ICD-10-CM

## 2021-07-16 NOTE — Progress Notes (Signed)
This patient returns to my office for at risk foot care.  This patient requires this care by a professional since this patient will be at risk due to having Renal insufficiency, CVA, PAD, T2DM.  He is taking plavix.   This patient is unable to cut nails himself since the patient cannot reach his nails.These nails are painful walking and wearing shoes.  This patient presents for at risk foot care today.  General Appearance  Alert, conversant and in no acute stress.  Vascular  Dorsalis pedis and posterior tibial  pulses are absent  bilaterally.  Capillary return is within normal limits  bilaterally. Temperature is within normal limits  bilaterally.  Neurologic  Senn-Weinstein monofilament wire test absent  bilaterally. Muscle power within normal limits bilaterally.  Nails Thick disfigured discolored nails with subungual debris  from hallux to fifth toes bilaterally. No evidence of bacterial infection or drainage bilaterally.  Orthopedic  No limitations of motion  feet .  No crepitus or effusions noted.  No bony pathology or digital deformities noted.HAV  B/L.  DJD midfoot  B/L.    Skin  normotropic skin noted bilaterally.  No signs of infections or ulcers noted.   Asymptomatic callus sub 5 left.  Patient used to have ulcers at these sites.  Onychomycosis  Pain in right toes  Pain in left toes Diabetes wth angiopathy and neurologic deficit.  Consent was obtained for treatment procedures.   Mechanical debridement of nails 1-5  bilaterally performed with a nail nipper.  Filed with dremel without incident. Patient to be scheduled for diabetic orthoses/diabetic shoes.     Return office visit   3 months                   Told patient to return for periodic foot care and evaluation due to potential at risk complications.   Brace Welte DPM   

## 2021-07-28 ENCOUNTER — Ambulatory Visit: Payer: Medicare HMO | Admitting: Physician Assistant

## 2021-07-28 ENCOUNTER — Encounter: Payer: Self-pay | Admitting: Physician Assistant

## 2021-07-28 ENCOUNTER — Telehealth: Payer: Self-pay | Admitting: Physician Assistant

## 2021-07-28 VITALS — BP 148/64 | HR 54 | Ht 70.0 in | Wt 259.8 lb

## 2021-07-28 DIAGNOSIS — I25119 Atherosclerotic heart disease of native coronary artery with unspecified angina pectoris: Secondary | ICD-10-CM | POA: Diagnosis not present

## 2021-07-28 DIAGNOSIS — N1831 Chronic kidney disease, stage 3a: Secondary | ICD-10-CM

## 2021-07-28 DIAGNOSIS — I10 Essential (primary) hypertension: Secondary | ICD-10-CM

## 2021-07-28 DIAGNOSIS — E785 Hyperlipidemia, unspecified: Secondary | ICD-10-CM | POA: Diagnosis not present

## 2021-07-28 DIAGNOSIS — R0602 Shortness of breath: Secondary | ICD-10-CM | POA: Diagnosis not present

## 2021-07-28 MED ORDER — FUROSEMIDE 40 MG PO TABS: ORAL_TABLET | ORAL | 3 refills | Status: DC

## 2021-07-28 MED ORDER — ISOSORBIDE MONONITRATE ER 60 MG PO TB24: 60.0000 mg | ORAL_TABLET | Freq: Every day | ORAL | 3 refills | Status: DC

## 2021-07-28 NOTE — Assessment & Plan Note (Signed)
He has difficulty tolerating statins.  He is currently on Crestor 5 mg once daily, Zetia 10 mg once daily.  He is to f/u with the lipid clinic in June 2023.   ?

## 2021-07-28 NOTE — Assessment & Plan Note (Signed)
He notes shortness of breath with exertion.  He has had weight gain and his legs are swollen.  He has a hx of venous insufficiency.  His activity declined when he hurt his back recently.  He has ?HJR on exam but his lungs are clear.  He may have an element of HFpEF as well. ?? Increase Lasix to 40 mg twice daily x 3 days, then 40 mg once daily ?? BMET, CBC, BNP today ?? R and L cardiac catheterization as noted ?? Arrange Echocardiogram  ?

## 2021-07-28 NOTE — Assessment & Plan Note (Signed)
S/p CABG in 2009. Cardiac catheterization in 2021 with patent grafts.  He has had some anginal symptoms in the past that were managed well with antianginal Rx.  However, recently he has developed worsening chest pain with exertion with associated shortness of breath. He describes CCS Class III angina despite 3 antianginal drugs.  His EKG does not show any new changes.  He also notes shortness of breath and has some leg edema. He has had venous insufficiency in the past.  His neck veins are flat but there is ?HJR.  His lungs are clear.  EF was normal in 2020 by echocardiogram.  He reduced activity several weeks ago due to back pain and has gained weight.  There may be an element of HFpEF as well.  However, his chest pain with exertion is concerning for angina.  I have recommended proceeding with cardiac catheterization.  I reviewed this with Dr. Angelena Form (attending MD) who agreed. ?? Arrange R and L cardiac catheterization in the next week ?? Increase Imdur to 60 mg once daily.  ?? Continue Norvasc 10 mg once daily, ASA 81 mg once daily, Plavix 75 mg once daily, metoprolol tartrate 100 mg twice daily, Crestor 5 mg once daily, prn NTG. ?? F/u post cath. ?? He knows to go to the ED for rest symptoms.  ?

## 2021-07-28 NOTE — Progress Notes (Signed)
?Cardiology Office Note:   ? ?Date:  07/28/2021  ? ?ID:  [name], DOB 01-30-1951, MRN [id number] ? ?PCP:  Celene Squibb, MD  ?[facility] Providers ?Cardiologist:  Sherren Mocha, MD ?Cardiology APP:  Liliane Shi, PA-C     ?Referring MD: Celene Squibb, MD  ? ?Chief Complaint:  Chest Pain ?  ? ?Patient Profile: ?Coronary artery disease  ?S/p CABG in 2009 ?Cath 6/21: patent grafts, Med Rx  ?Hypertension  ?Hyperlipidemia  ?Diabetes mellitus  ?Hx of diabetic foot ulcer ?PAD  ?eval by Dr. Fletcher Anon in 2019 >> small vessel dz >> med Rx  ?S/p cryptogenic CVA 01/2019 ?2 vascular territories (R temporoparietal and occipital) - ?cardioembolic  ?Event monitor done by PCP - No AFib  ?Pt declined ref to EP for ILR ?  ?Prior CV Studies: ?Cardiac catheterization 10/04/19 ?LAD occluded ?LCx occluded ?Subtotal calcific occlusion of the RCA and PDA ?L-LAD patent ?S-OM/R PLA patent ?S-RPDA patent ?S-Dx patent ?EF 60 ?  ?Event monitor 02/2019 ?Sinus rhythm, HR 48-99 ?No atrial fibrillation ?  ?Echocardiogram 02/01/2019 ?EF 60-65, mod LVH, mildly reduced RVSF, trace MR, mild TR ?  ?Carotid US 01/31/2019 ?IMPRESSION: ?Color duplex indicates minimal heterogeneous and calcified plaque, ?with no hemodynamically significant stenosis by duplex criteria in ?the extracranial cerebrovascular circulation. ?  ?Echocardiogram 10/29/2017 ?Mild LVH, EF 98-92, normal diastolic function, trivial AI, moderate LAE ?  ?Nuclear stress test 10/29/2017 ?EF 50, fixed septal/anteroseptal, lateral/inferolateral defect suggesting artifact, no ischemia, low risk ?  ?ETT-Myoview (03/14/13):   ?Ex 9:00, ECG with 1 mm ST depression inferiorly, primarily reversible medium-sized anteroseptal defect, EF 54% (intermediate risk).  ?  ?Carotid US (04/2012):   ?Mild plaque bilaterally (0-39%) => repeat in 1 year. ? ? ?History of Present Illness:   ?[name] is a 71 y.o. male with the above problem list.  He returns for the evaluation of chest pain.  He is here  with his wife.  He hurt his back a few months ago and has been less active.  He has gained 14 lbs since March.  Over the past month, he has noted dyspnea on exertion and exertional chest pain described as burning.  There is no radiation or assoc nausea or diaphoresis.  He has not had syncope.  He has not had orthopnea. He has noted increased LE edema recently.  He has not had to take NTG for his chest pain.  His symptoms resolve with rest.  ?   ?Past Medical History:  ?Diagnosis Date  ? Allergy   ? CAD (coronary artery disease), native coronary artery   ? Multiple PCI procedures followed by CABG in 2009 with LIMA-LAD, SVG-D, seq SVG-OM and PLV, SVG-PDA. Echo (3/12): EF 60-65%, mild MR. // Nuc Stress Test 7/19:  EF 50, artifact, no ischemia, low risk   ? Carotid artery disease ([place]) 01/31/2017  ? dopplers 1/14:  0-39% bilat  ? Carotid stenosis   ? dopplers 1/14:  0-39% bilat  ? Cataract   ? REMOVED  ? Colon polyps 2006  ? Colonoscopy-Dr. Patterson(Tubular Adenoma)   ? Coronary Artery Disease 06/03/2008  ? Annotation: S/P CABG 2009 Qualifier: Diagnosis of  By: Olevia Perches, MD, Glenetta Hew   ? DM2 (diabetes mellitus, type 2) ([place])   ? Followed by Dr. Carlis Abbott  ? Essential hypertension 06/03/2008  ? Qualifier: Diagnosis of  By: Olevia Perches, MD, Glenetta Hew   ? History of echocardiogram   ? Echo 7/19: Mild LVH, EF 11-94, normal diastolic  function, trivial AI, moderate LAE  ? HLD (hyperlipidemia)   ? HTN (hypertension)   ? Hx of cardiovascular stress test   ? ETT-Myoview (03/2013):  Anteroseptal reversible defect, EF 54%, intermediate risk.  ? Hyperlipidemia 06/03/2008  ? Qualifier: Diagnosis of  By: Olevia Perches, MD, Glenetta Hew   ? Ischemic stroke ([place]) 01/30/2019  ? OSA (obstructive sleep apnea) 08/25/2011  ? PAD (peripheral artery disease) ([place]) 12/10/2017  ? Postoperative atrial fibrillation ([place]) 2009  ? Renal insufficiency 01/31/2019  ? Stroke [facility])   ? Type 2 diabetes mellitus with complication, with long-term current  use of insulin ([place]) 06/03/2008  ? Qualifier: Diagnosis of  By: Olevia Perches, MD, Glenetta Hew   ? Ulcer   ? left foot  ? Varicose veins of lower extremities with other complications 04/15/5091  ? ?Current Medications: ?Current Meds  ?Medication Sig  ? amitriptyline (ELAVIL) 75 MG tablet Take 75 mg by mouth at bedtime.  ? amLODipine (NORVASC) 10 MG tablet TAKE 1 TABLET BY MOUTH EVERY DAY  ? aspirin 81 MG EC tablet Take 1 tablet (81 mg total) by mouth daily.  ? clopidogrel (PLAVIX) 75 MG tablet TAKE 1 TABLET BY MOUTH EVERY DAY  ? ezetimibe (ZETIA) 10 MG tablet TAKE 1 TABLET BY MOUTH EVERY DAY  ? fenofibrate 54 MG tablet TAKE 1 TABLET BY MOUTH EVERY DAY  ? FLUZONE HIGH-DOSE QUADRIVALENT 0.7 ML SUSY   ? furosemide (LASIX) 40 MG tablet Take 1 tablet by mouth twice a day for 3 days then reduce down to 1 tablet by mouth daily  ? glucose monitoring kit (FREESTYLE) monitoring kit 1 each by Does not apply route as needed for other. Freestyle Libre  ? isosorbide mononitrate (IMDUR) 60 MG 24 hr tablet Take 1 tablet (60 mg total) by mouth daily.  ? metFORMIN (GLUCOPHAGE) 1000 MG tablet Take 1,000 mg by mouth 2 (two) times daily with a meal.  ? metoprolol tartrate (LOPRESSOR) 100 MG tablet TAKE 1 TABLET BY MOUTH TWICE A DAY  ? MODERNA COVID-19 BIVAL BOOSTER 50 MCG/0.5ML injection   ? nitroGLYCERIN (NITROSTAT) 0.4 MG SL tablet PLACE 1 TABLET UNDER THE TONGUE EVERY 5 MINUTES AS NEEDED FOR CHEST PAIN  ? NOVOLOG MIX 70/30 FLEXPEN (70-30) 100 UNIT/ML Pen Inject 40-60 Units into the skin 2 (two) times daily. Per sliding scale  ? OZEMPIC, 0.25 OR 0.5 MG/DOSE, 2 MG/1.5ML SOPN Inject 1 unit into the skin per week  ? rosuvastatin (CRESTOR) 5 MG tablet Take 1 tablet (5 mg total) by mouth daily.  ? [DISCONTINUED] furosemide (LASIX) 40 MG tablet Take 1 tablet (40 mg total) by mouth daily. Pt needs to keep upcoming appt in Mar for further refills  ? [DISCONTINUED] isosorbide mononitrate (IMDUR) 30 MG 24 hr tablet Take 1.5 tablets (45 mg total)  by mouth daily.  ?  ?Allergies:   Metformin and related and Penicillins  ? ?Social History  ? ?Tobacco Use  ? Smoking status: Never  ? Smokeless tobacco: Former  ?  Types: Chew  ?  Quit date: 04/06/1993  ?Vaping Use  ? Vaping Use: Never used  ?Substance Use Topics  ? Alcohol use: No  ? Drug use: No  ?  ?Family Hx: ?The patient's family history includes Cancer in his mother; Colon cancer in his mother; Coronary artery disease in an other family member; Diabetes in his brother, father, and mother; Heart disease in his father, mother, and another family member; Hyperlipidemia in his father and mother; Hypertension in his father; Other  in his brother and brother; Prostate cancer in his brother and father. ? ?Review of Systems  ?Gastrointestinal:  Negative for hematochezia.  ?Genitourinary:  Negative for hematuria.   ? ?EKGs/Labs/Other Test Reviewed:   ? ?EKG:  EKG is   ordered today.  The ekg ordered today demonstrates sinus brady, HR 54, LAD, TW abnormality 1, aVL, QTc 407 ms, no change from prior tracing.  ? ?Recent Labs: ?No results found for requested labs within last 8760 hours.  ? ?Recent Lipid Panel ?No results for input(s): CHOL, TRIG, HDL, VLDL, LDLCALC, LDLDIRECT in the last 8760 hours.  ? ?Risk Assessment/Calculations:   ?  ?    ?Physical Exam:   ? ?VS:  BP (!) 148/64   Pulse (!) 54   Ht _0  (1.778 m)   Wt 259 lb 12.8 oz (117.8 kg)   SpO2 93%   BMI 37.28 kg/m?    ? ?Wt Readings from Last 3 Encounters:  ?07/28/21 259 lb 12.8 oz (117.8 kg)  ?06/04/21 245 lb 9.6 oz (111.4 kg)  ?12/20/20 244 lb (110.7 kg)  ?  ?Constitutional:   ?   Appearance: Healthy appearance. Not in distress.  ?Neck:  ?   Vascular: JVR (?minimal JVR) present. JVD normal.  ?Pulmonary:  ?   Effort: Pulmonary effort is normal.  ?   Breath sounds: No wheezing. No rales.  ?Cardiovascular:  ?   Normal rate. Regular rhythm. Normal S1. Normal S2.   ?   Murmurs: There is no murmur.  ?Edema: ?   Peripheral edema present. ?   Pretibial: bilateral  2+ edema of the pretibial area. ?Abdominal:  ?   Palpations: Abdomen is soft.  ?Skin: ?   General: Skin is warm and dry.  ?Neurological:  ?   General: No focal deficit present.  ?   Mental Status: Alert a

## 2021-07-28 NOTE — Assessment & Plan Note (Signed)
BP above target.  Increase Lasix and Imdur as noted.  Continue Norvasc 10 mg once daily, Metoprolol tartrate 100 mg twice daily. ?

## 2021-07-28 NOTE — Patient Instructions (Addendum)
Medication Instructions:  ?Your physician has recommended you make the following change in your medication:  ? INCREASE the Lasix to 40 mg taking 1 twice a day X's 3 days then go back down 1 tablet daily ? INCREASE the Isosorbide to 60 mg taking 1 daily   ? ? ?*If you need a refill on your cardiac medications before your next appointment, please call your pharmacy* ? ? ?Lab Work: ?TODAY:  BMET & CBC ? ?If you have labs (blood work) drawn today and your tests are completely normal, you will receive your results only by: ?MyChart Message (if you have MyChart) OR ?A paper copy in the mail ?If you have any lab test that is abnormal or we need to change your treatment, we will call you to review the results. ? ? ?Testing/Procedures: ?Your physician has requested that you have an echocardiogram. Echocardiography is a painless test that uses sound waves to create images of your heart. It provides your doctor with information about the size and shape of your heart and how well your heart?s chambers and valves are working. This procedure takes approximately one hour. There are no restrictions for this procedure. ? ? ?Your physician has requested that you have a cardiac catheterization. Cardiac catheterization is used to diagnose and/or treat various heart conditions. Doctors may recommend this procedure for a number of different reasons. The most common reason is to evaluate chest pain. Chest pain can be a symptom of coronary artery disease (CAD), and cardiac catheterization can show whether plaque is narrowing or blocking your heart?s arteries. This procedure is also used to evaluate the valves, as well as measure the blood flow and oxygen levels in different parts of your heart. For further information please visit [email or web address] Please follow instruction sheet, BELOW. ? ? ?[facility] ?[facility] [address] [facility] ?[address], [address] ?[place]  [address] ?Dept: [phone] ?Loc: [phone] ? ?[name]  07/28/2021 ? ?You are scheduled for a Cardiac Catheterization on Wednesday, May 3 with Dr. Tonny Bollman. ? ?1. Please arrive at the Main Entrance A at [facility]: [address] [place], [place] [address] at 11:30 AM (This time is two hours before your procedure to ensure your preparation). Free valet parking service is available.  ? ?Special note: Every effort is made to have your procedure done on time. Please understand that emergencies sometimes delay scheduled procedures. ? ?2. Diet: Do not eat solid foods after midnight.  You may have clear liquids until 5 AM upon the day of the procedure. ? ?3. Labs: TODAY  ? ?4. Medication instructions in preparation for your procedure: ? ? Contrast Allergy: No ? ? ?Stop taking, Lasix (Furosemide)  Wednesday, May 3, ? ?Take only 1/2 OF YOUR REGULAR DOSE ON THE NIGHT BEFORE YOUR PROCEDURE  units of insulin the night before your procedure. Do not take any insulin on the day of the procedure. ? ?Do not take Diabetes Med Glucophage (Metformin) on the day of the procedure and HOLD 48 HOURS AFTER THE PROCEDURE. ? ?On the morning of your procedure, take Aspirin and Plavix/Clopidogrel and any morning medicines NOT listed above.  You may use sips of water. ? ?5. Plan to go home the same day, you will only stay overnight if medically necessary. ?6. You MUST have a responsible adult to drive you home. ?7. An adult MUST be with you the first 24 hours after you arrive home. ?8. Bring a current  list of your medications, and the last time and date medication taken. ?9. Bring ID and current insurance cards. ?10.Please wear clothes that are easy to get on and off and wear slip-on shoes. ? ?Thank you for allowing [place] to care for you! ?  -- [facility] ? ? ?Follow-Up: ?At [facility], you and your health needs are our priority.  As part of our continuing mission to provide you with  exceptional heart care, we have created designated Provider Care Teams.  These Care Teams include your primary Cardiologist (physician) and Advanced Practice Providers (APPs -  Physician Assistants and Nurse Practitioners) who all work together to provide you with the care you need, when you need it. ? ?We recommend signing up for the patient portal called "MyChart".  Sign up information is provided on this After Visit Summary.  MyChart is used to connect with patients for Virtual Visits (Telemedicine).  Patients are able to view lab/test results, encounter notes, upcoming appointments, etc.  Non-urgent messages can be sent to your provider as well.   ?To learn more about what you can do with MyChart, go to [email or web address].   ? ?Your next appointment:   ?2 week(s) ? ?The format for your next appointment:   ?In Person ? ?Provider:   ?Tonny Bollman, MD  or Tereso Newcomer, PA-C       ? ? ?Other Instructions ? ? ?Important Information About Sugar ? ? ? ? ?  ?

## 2021-07-28 NOTE — H&P (View-Only) (Signed)
?Cardiology Office Note:   ? ?Date:  07/28/2021  ? ?ID:  Edson Snowball, DOB 01-30-1951, MRN [id number] ? ?PCP:  Celene Squibb, MD  ?[facility] Providers ?Cardiologist:  Sherren Mocha, MD ?Cardiology APP:  Liliane Shi, PA-C     ?Referring MD: Celene Squibb, MD  ? ?Chief Complaint:  Chest Pain ?  ? ?Patient Profile: ?Coronary artery disease  ?S/p CABG in 2009 ?Cath 6/21: patent grafts, Med Rx  ?Hypertension  ?Hyperlipidemia  ?Diabetes mellitus  ?Hx of diabetic foot ulcer ?PAD  ?eval by Dr. Fletcher Anon in 2019 >> small vessel dz >> med Rx  ?S/p cryptogenic CVA 01/2019 ?2 vascular territories (R temporoparietal and occipital) - ?cardioembolic  ?Event monitor done by PCP - No AFib  ?Pt declined ref to EP for ILR ?  ?Prior CV Studies: ?Cardiac catheterization 10/04/19 ?LAD occluded ?LCx occluded ?Subtotal calcific occlusion of the RCA and PDA ?L-LAD patent ?S-OM/R PLA patent ?S-RPDA patent ?S-Dx patent ?EF 60 ?  ?Event monitor 02/2019 ?Sinus rhythm, HR 48-99 ?No atrial fibrillation ?  ?Echocardiogram 02/01/2019 ?EF 60-65, mod LVH, mildly reduced RVSF, trace MR, mild TR ?  ?Carotid US 01/31/2019 ?IMPRESSION: ?Color duplex indicates minimal heterogeneous and calcified plaque, ?with no hemodynamically significant stenosis by duplex criteria in ?the extracranial cerebrovascular circulation. ?  ?Echocardiogram 10/29/2017 ?Mild LVH, EF 98-92, normal diastolic function, trivial AI, moderate LAE ?  ?Nuclear stress test 10/29/2017 ?EF 50, fixed septal/anteroseptal, lateral/inferolateral defect suggesting artifact, no ischemia, low risk ?  ?ETT-Myoview (03/14/13):   ?Ex 9:00, ECG with 1 mm ST depression inferiorly, primarily reversible medium-sized anteroseptal defect, EF 54% (intermediate risk).  ?  ?Carotid US (04/2012):   ?Mild plaque bilaterally (0-39%) => repeat in 1 year. ? ? ?History of Present Illness:   ?[name] is a 71 y.o. male with the above problem list.  He returns for the evaluation of chest pain.  He is here  with his wife.  He hurt his back a few months ago and has been less active.  He has gained 14 lbs since March.  Over the past month, he has noted dyspnea on exertion and exertional chest pain described as burning.  There is no radiation or assoc nausea or diaphoresis.  He has not had syncope.  He has not had orthopnea. He has noted increased LE edema recently.  He has not had to take NTG for his chest pain.  His symptoms resolve with rest.  ?   ?Past Medical History:  ?Diagnosis Date  ? Allergy   ? CAD (coronary artery disease), native coronary artery   ? Multiple PCI procedures followed by CABG in 2009 with LIMA-LAD, SVG-D, seq SVG-OM and PLV, SVG-PDA. Echo (3/12): EF 60-65%, mild MR. // Nuc Stress Test 7/19:  EF 50, artifact, no ischemia, low risk   ? Carotid artery disease ([place]) 01/31/2017  ? dopplers 1/14:  0-39% bilat  ? Carotid stenosis   ? dopplers 1/14:  0-39% bilat  ? Cataract   ? REMOVED  ? Colon polyps 2006  ? Colonoscopy-Dr. Patterson(Tubular Adenoma)   ? Coronary Artery Disease 06/03/2008  ? Annotation: S/P CABG 2009 Qualifier: Diagnosis of  By: Olevia Perches, MD, Glenetta Hew   ? DM2 (diabetes mellitus, type 2) ([place])   ? Followed by Dr. Carlis Abbott  ? Essential hypertension 06/03/2008  ? Qualifier: Diagnosis of  By: Olevia Perches, MD, Glenetta Hew   ? History of echocardiogram   ? Echo 7/19: Mild LVH, EF 11-94, normal diastolic  function, trivial AI, moderate LAE  ? HLD (hyperlipidemia)   ? HTN (hypertension)   ? Hx of cardiovascular stress test   ? ETT-Myoview (03/2013):  Anteroseptal reversible defect, EF 54%, intermediate risk.  ? Hyperlipidemia 06/03/2008  ? Qualifier: Diagnosis of  By: Olevia Perches, MD, Glenetta Hew   ? Ischemic stroke ([place]) 01/30/2019  ? OSA (obstructive sleep apnea) 08/25/2011  ? PAD (peripheral artery disease) ([place]) 12/10/2017  ? Postoperative atrial fibrillation ([place]) 2009  ? Renal insufficiency 01/31/2019  ? Stroke [facility])   ? Type 2 diabetes mellitus with complication, with long-term current  use of insulin ([place]) 06/03/2008  ? Qualifier: Diagnosis of  By: Olevia Perches, MD, Glenetta Hew   ? Ulcer   ? left foot  ? Varicose veins of lower extremities with other complications 04/15/5091  ? ?Current Medications: ?Current Meds  ?Medication Sig  ? amitriptyline (ELAVIL) 75 MG tablet Take 75 mg by mouth at bedtime.  ? amLODipine (NORVASC) 10 MG tablet TAKE 1 TABLET BY MOUTH EVERY DAY  ? aspirin 81 MG EC tablet Take 1 tablet (81 mg total) by mouth daily.  ? clopidogrel (PLAVIX) 75 MG tablet TAKE 1 TABLET BY MOUTH EVERY DAY  ? ezetimibe (ZETIA) 10 MG tablet TAKE 1 TABLET BY MOUTH EVERY DAY  ? fenofibrate 54 MG tablet TAKE 1 TABLET BY MOUTH EVERY DAY  ? FLUZONE HIGH-DOSE QUADRIVALENT 0.7 ML SUSY   ? furosemide (LASIX) 40 MG tablet Take 1 tablet by mouth twice a day for 3 days then reduce down to 1 tablet by mouth daily  ? glucose monitoring kit (FREESTYLE) monitoring kit 1 each by Does not apply route as needed for other. Freestyle Libre  ? isosorbide mononitrate (IMDUR) 60 MG 24 hr tablet Take 1 tablet (60 mg total) by mouth daily.  ? metFORMIN (GLUCOPHAGE) 1000 MG tablet Take 1,000 mg by mouth 2 (two) times daily with a meal.  ? metoprolol tartrate (LOPRESSOR) 100 MG tablet TAKE 1 TABLET BY MOUTH TWICE A DAY  ? MODERNA COVID-19 BIVAL BOOSTER 50 MCG/0.5ML injection   ? nitroGLYCERIN (NITROSTAT) 0.4 MG SL tablet PLACE 1 TABLET UNDER THE TONGUE EVERY 5 MINUTES AS NEEDED FOR CHEST PAIN  ? NOVOLOG MIX 70/30 FLEXPEN (70-30) 100 UNIT/ML Pen Inject 40-60 Units into the skin 2 (two) times daily. Per sliding scale  ? OZEMPIC, 0.25 OR 0.5 MG/DOSE, 2 MG/1.5ML SOPN Inject 1 unit into the skin per week  ? rosuvastatin (CRESTOR) 5 MG tablet Take 1 tablet (5 mg total) by mouth daily.  ? [DISCONTINUED] furosemide (LASIX) 40 MG tablet Take 1 tablet (40 mg total) by mouth daily. Pt needs to keep upcoming appt in Mar for further refills  ? [DISCONTINUED] isosorbide mononitrate (IMDUR) 30 MG 24 hr tablet Take 1.5 tablets (45 mg total)  by mouth daily.  ?  ?Allergies:   Metformin and related and Penicillins  ? ?Social History  ? ?Tobacco Use  ? Smoking status: Never  ? Smokeless tobacco: Former  ?  Types: Chew  ?  Quit date: 04/06/1993  ?Vaping Use  ? Vaping Use: Never used  ?Substance Use Topics  ? Alcohol use: No  ? Drug use: No  ?  ?Family Hx: ?The patient's family history includes Cancer in his mother; Colon cancer in his mother; Coronary artery disease in an other family member; Diabetes in his brother, father, and mother; Heart disease in his father, mother, and another family member; Hyperlipidemia in his father and mother; Hypertension in his father; Other  in his brother and brother; Prostate cancer in his brother and father. ? ?Review of Systems  ?Gastrointestinal:  Negative for hematochezia.  ?Genitourinary:  Negative for hematuria.   ? ?EKGs/Labs/Other Test Reviewed:   ? ?EKG:  EKG is   ordered today.  The ekg ordered today demonstrates sinus brady, HR 54, LAD, TW abnormality 1, aVL, QTc 407 ms, no change from prior tracing.  ? ?Recent Labs: ?No results found for requested labs within last 8760 hours.  ? ?Recent Lipid Panel ?No results for input(s): CHOL, TRIG, HDL, VLDL, LDLCALC, LDLDIRECT in the last 8760 hours.  ? ?Risk Assessment/Calculations:   ?  ?    ?Physical Exam:   ? ?VS:  BP (!) 148/64   Pulse (!) 54   Ht _0  (1.778 m)   Wt 259 lb 12.8 oz (117.8 kg)   SpO2 93%   BMI 37.28 kg/m?    ? ?Wt Readings from Last 3 Encounters:  ?07/28/21 259 lb 12.8 oz (117.8 kg)  ?06/04/21 245 lb 9.6 oz (111.4 kg)  ?12/20/20 244 lb (110.7 kg)  ?  ?Constitutional:   ?   Appearance: Healthy appearance. Not in distress.  ?Neck:  ?   Vascular: JVR (?minimal JVR) present. JVD normal.  ?Pulmonary:  ?   Effort: Pulmonary effort is normal.  ?   Breath sounds: No wheezing. No rales.  ?Cardiovascular:  ?   Normal rate. Regular rhythm. Normal S1. Normal S2.   ?   Murmurs: There is no murmur.  ?Edema: ?   Peripheral edema present. ?   Pretibial: bilateral  2+ edema of the pretibial area. ?Abdominal:  ?   Palpations: Abdomen is soft.  ?Skin: ?   General: Skin is warm and dry.  ?Neurological:  ?   General: No focal deficit present.  ?   Mental Status: Alert a

## 2021-07-28 NOTE — Assessment & Plan Note (Signed)
Obtain BMET today.  Limit contrast during cardiac catheterization.  GFR 56.  Repeat BMET today.  If still > 45, will just need to hold Lasix day before and day of cath.  ?

## 2021-07-28 NOTE — Telephone Encounter (Signed)
Scheduled OV for patient to see [name] this afternoon at 3:10  ?

## 2021-07-29 LAB — CBC
Hematocrit: 40.3 % (ref 37.5–51.0)
Hemoglobin: 13.6 g/dL (ref 13.0–17.7)
MCH: 28.7 pg (ref 26.6–33.0)
MCHC: 33.7 g/dL (ref 31.5–35.7)
MCV: 85 fL (ref 79–97)
Platelets: 249 10*3/uL (ref 150–450)
RBC: 4.74 x10E6/uL (ref 4.14–5.80)
RDW: 13.7 % (ref 11.6–15.4)
WBC: 7.1 10*3/uL (ref 3.4–10.8)

## 2021-07-29 LAB — BASIC METABOLIC PANEL
BUN/Creatinine Ratio: 15 (ref 10–24)
BUN: 20 mg/dL (ref 8–27)
CO2: 21 mmol/L (ref 20–29)
Calcium: 10 mg/dL (ref 8.6–10.2)
Chloride: 105 mmol/L (ref 96–106)
Creatinine, Ser: 1.35 mg/dL — ABNORMAL HIGH (ref 0.76–1.27)
Glucose: 143 mg/dL — ABNORMAL HIGH (ref 70–99)
Potassium: 4.8 mmol/L (ref 3.5–5.2)
Sodium: 144 mmol/L (ref 134–144)
eGFR: 57 mL/min/{1.73_m2} — ABNORMAL LOW (ref >59)

## 2021-07-29 NOTE — Addendum Note (Signed)
Addended by: Burnetta Sabin on: 07/29/2021 03:00 PM ? ? Modules accepted: Orders ? ?

## 2021-07-30 LAB — PRO B NATRIURETIC PEPTIDE: NT-Pro BNP: 395 pg/mL — ABNORMAL HIGH (ref 0–376)

## 2021-07-30 LAB — SPECIMEN STATUS REPORT

## 2021-08-05 ENCOUNTER — Telehealth: Payer: Self-pay | Admitting: *Deleted

## 2021-08-05 NOTE — Telephone Encounter (Signed)
Cardiac Catheterization scheduled at [facility] for: Wednesday Aug 06, 2021 1:30 PM ?Arrival time and place: [facility] Main Entrance A at: 11:30 AM ? ? ?No solid food after midnight prior to cath, clear liquids until 5 AM day of procedure. ? ?Medication instructions: ?-Hold: ? Metformin-day of procedure and 48 hours post procedure ? Insulin-AM of procedure/1/2 usual Insulin HS prior to procedure ? Lasix-AM of procedure ? Ozempic-usual day Wednesday-pt took today ?-Except hold medications usual morning medications can be taken with sips of water including aspirin 81 mg and Plavix 75 mg.  ? ?Confirmed patient has responsible adult to drive home post procedure and be with patient first 24 hours after arriving home. ? ?Patient reports no new symptoms concerning for COVID-19/no exposure to COVID-19 in the past 10 days. ? ?Reviewed procedure instructions with patient.  ?

## 2021-08-06 ENCOUNTER — Other Ambulatory Visit: Payer: Self-pay

## 2021-08-06 ENCOUNTER — Ambulatory Visit (HOSPITAL_COMMUNITY)
Admission: RE | Admit: 2021-08-06 | Discharge: 2021-08-06 | Disposition: A | Discharge status: Home / Self Care | Payer: Medicare HMO | Attending: Cardiovascular Disease | Admitting: Cardiovascular Disease

## 2021-08-06 ENCOUNTER — Encounter (HOSPITAL_COMMUNITY)
Admission: RE | Disposition: A | Discharge status: Home / Self Care | Payer: Self-pay | Source: Home / Self Care | Attending: Cardiovascular Disease

## 2021-08-06 DIAGNOSIS — N189 Chronic kidney disease, unspecified: Secondary | ICD-10-CM | POA: Insufficient documentation

## 2021-08-06 DIAGNOSIS — Z955 Presence of coronary angioplasty implant and graft: Secondary | ICD-10-CM | POA: Insufficient documentation

## 2021-08-06 DIAGNOSIS — I2582 Chronic total occlusion of coronary artery: Secondary | ICD-10-CM | POA: Insufficient documentation

## 2021-08-06 DIAGNOSIS — E785 Hyperlipidemia, unspecified: Secondary | ICD-10-CM | POA: Insufficient documentation

## 2021-08-06 DIAGNOSIS — R0602 Shortness of breath: Secondary | ICD-10-CM

## 2021-08-06 DIAGNOSIS — Z7982 Long term (current) use of aspirin: Secondary | ICD-10-CM | POA: Insufficient documentation

## 2021-08-06 DIAGNOSIS — Z794 Long term (current) use of insulin: Secondary | ICD-10-CM | POA: Insufficient documentation

## 2021-08-06 DIAGNOSIS — Z7984 Long term (current) use of oral hypoglycemic drugs: Secondary | ICD-10-CM | POA: Diagnosis not present

## 2021-08-06 DIAGNOSIS — Z7985 Long-term (current) use of injectable non-insulin antidiabetic drugs: Secondary | ICD-10-CM | POA: Diagnosis not present

## 2021-08-06 DIAGNOSIS — Z79899 Other long term (current) drug therapy: Secondary | ICD-10-CM | POA: Diagnosis not present

## 2021-08-06 DIAGNOSIS — E1122 Type 2 diabetes mellitus with diabetic chronic kidney disease: Secondary | ICD-10-CM | POA: Diagnosis not present

## 2021-08-06 DIAGNOSIS — I129 Hypertensive chronic kidney disease with stage 1 through stage 4 chronic kidney disease, or unspecified chronic kidney disease: Secondary | ICD-10-CM | POA: Diagnosis not present

## 2021-08-06 DIAGNOSIS — Z951 Presence of aortocoronary bypass graft: Secondary | ICD-10-CM | POA: Diagnosis not present

## 2021-08-06 DIAGNOSIS — Z8673 Personal history of transient ischemic attack (TIA), and cerebral infarction without residual deficits: Secondary | ICD-10-CM | POA: Insufficient documentation

## 2021-08-06 DIAGNOSIS — Z7902 Long term (current) use of antithrombotics/antiplatelets: Secondary | ICD-10-CM | POA: Diagnosis not present

## 2021-08-06 DIAGNOSIS — I25119 Atherosclerotic heart disease of native coronary artery with unspecified angina pectoris: Secondary | ICD-10-CM | POA: Diagnosis not present

## 2021-08-06 DIAGNOSIS — E1151 Type 2 diabetes mellitus with diabetic peripheral angiopathy without gangrene: Secondary | ICD-10-CM | POA: Insufficient documentation

## 2021-08-06 DIAGNOSIS — Z87891 Personal history of nicotine dependence: Secondary | ICD-10-CM | POA: Insufficient documentation

## 2021-08-06 HISTORY — PX: RIGHT/LEFT HEART CATH AND CORONARY/GRAFT ANGIOGRAPHY: CATH118267

## 2021-08-06 LAB — POCT I-STAT EG7
Acid-Base Excess: 1 mmol/L (ref 0.0–2.0)
Bicarbonate: 27 mmol/L (ref 20.0–28.0)
Calcium, Ion: 1.24 mmol/L (ref 1.15–1.40)
HCT: 42 % (ref 39.0–52.0)
Hemoglobin: 14.3 g/dL (ref 13.0–17.0)
O2 Saturation: 71 %
Potassium: 3.9 mmol/L (ref 3.5–5.1)
Sodium: 140 mmol/L (ref 135–145)
TCO2: 28 mmol/L (ref 22–32)
pCO2, Ven: 46.5 mmHg (ref 44–60)
pH, Ven: 7.371 (ref 7.25–7.43)
pO2, Ven: 38 mmHg (ref 32–45)

## 2021-08-06 LAB — POCT I-STAT 7, (LYTES, BLD GAS, ICA,H+H)
Acid-Base Excess: 0 mmol/L (ref 0.0–2.0)
Bicarbonate: 25.8 mmol/L (ref 20.0–28.0)
Calcium, Ion: 1.24 mmol/L (ref 1.15–1.40)
HCT: 42 % (ref 39.0–52.0)
Hemoglobin: 14.3 g/dL (ref 13.0–17.0)
O2 Saturation: 94 %
Potassium: 3.9 mmol/L (ref 3.5–5.1)
Sodium: 141 mmol/L (ref 135–145)
TCO2: 27 mmol/L (ref 22–32)
pCO2 arterial: 43.9 mmHg (ref 32–48)
pH, Arterial: 7.377 (ref 7.35–7.45)
pO2, Arterial: 71 mmHg — ABNORMAL LOW (ref 83–108)

## 2021-08-06 LAB — GLUCOSE, CAPILLARY
Glucose-Capillary: 177 mg/dL — ABNORMAL HIGH (ref 70–99)
Glucose-Capillary: 144 mg/dL — ABNORMAL HIGH (ref 70–99)

## 2021-08-06 SURGERY — RIGHT/LEFT HEART CATH AND CORONARY/GRAFT ANGIOGRAPHY
Anesthesia: LOCAL

## 2021-08-06 MED ORDER — SODIUM CHLORIDE 0.9% FLUSH: 3.0000 mL | INTRAVENOUS | Status: DC | PRN

## 2021-08-06 MED ORDER — FENTANYL CITRATE (PF) 100 MCG/2ML IJ SOLN
INTRAMUSCULAR | Status: AC
  Filled 2021-08-06: qty 2

## 2021-08-06 MED ORDER — HYDRALAZINE HCL 20 MG/ML IJ SOLN: 10.0000 mg | INTRAMUSCULAR | Status: DC | PRN

## 2021-08-06 MED ORDER — ASPIRIN 81 MG PO CHEW: 81.0000 mg | CHEWABLE_TABLET | ORAL | Status: DC

## 2021-08-06 MED ORDER — HEPARIN (PORCINE) IN NACL 1000-0.9 UT/500ML-% IV SOLN
INTRAVENOUS | Status: DC | PRN
  Administered 2021-08-06 (×2): 500 mL

## 2021-08-06 MED ORDER — LABETALOL HCL 5 MG/ML IV SOLN: 10.0000 mg | INTRAVENOUS | Status: DC | PRN

## 2021-08-06 MED ORDER — MIDAZOLAM HCL 2 MG/2ML IJ SOLN
INTRAMUSCULAR | Status: AC
  Filled 2021-08-06: qty 2

## 2021-08-06 MED ORDER — FENTANYL CITRATE (PF) 100 MCG/2ML IJ SOLN
INTRAMUSCULAR | Status: DC | PRN
  Administered 2021-08-06: 25 ug via INTRAVENOUS

## 2021-08-06 MED ORDER — LIDOCAINE HCL (PF) 1 % IJ SOLN
INTRAMUSCULAR | Status: DC | PRN
  Administered 2021-08-06: 10 mL

## 2021-08-06 MED ORDER — MIDAZOLAM HCL 2 MG/2ML IJ SOLN
INTRAMUSCULAR | Status: DC | PRN
  Administered 2021-08-06: 2 mg via INTRAVENOUS

## 2021-08-06 MED ORDER — SODIUM CHLORIDE 0.9 % IV SOLN: 250.0000 mL | INTRAVENOUS | Status: DC | PRN

## 2021-08-06 MED ORDER — SODIUM CHLORIDE 0.9 % IV SOLN: INTRAVENOUS | Status: DC

## 2021-08-06 MED ORDER — IOHEXOL 350 MG/ML SOLN
INTRAVENOUS | Status: DC | PRN
  Administered 2021-08-06: 80 mL

## 2021-08-06 MED ORDER — LIDOCAINE HCL (PF) 1 % IJ SOLN
INTRAMUSCULAR | Status: AC
  Filled 2021-08-06: qty 30

## 2021-08-06 MED ORDER — SODIUM CHLORIDE 0.9 % WEIGHT BASED INFUSION
3.0000 mL/kg/h | INTRAVENOUS | Status: AC
  Administered 2021-08-06: 3 mL/kg/h via INTRAVENOUS

## 2021-08-06 MED ORDER — ACETAMINOPHEN 325 MG PO TABS: 650.0000 mg | ORAL_TABLET | ORAL | Status: DC | PRN

## 2021-08-06 MED ORDER — SODIUM CHLORIDE 0.9 % WEIGHT BASED INFUSION
1.0000 mL/kg/h | INTRAVENOUS | Status: DC
  Administered 2021-08-06: 1 mL/kg/h via INTRAVENOUS

## 2021-08-06 MED ORDER — SODIUM CHLORIDE 0.9% FLUSH: 3.0000 mL | Freq: Two times a day (BID) | INTRAVENOUS | Status: DC

## 2021-08-06 MED ORDER — ONDANSETRON HCL 4 MG/2ML IJ SOLN: 4.0000 mg | Freq: Four times a day (QID) | INTRAMUSCULAR | Status: DC | PRN

## 2021-08-06 SURGICAL SUPPLY — 19 items
CATH BALLN WEDGE 5F 110CM (CATHETERS) IMPLANT
CATH EXPO 5F MPA-1 (CATHETERS) ×1 IMPLANT
CATH INFINITI 5FR JL5 (CATHETERS) ×1 IMPLANT
CATH INFINITI 5FR MULTPACK ANG (CATHETERS) ×1 IMPLANT
CATH SWAN GANZ 7F STRAIGHT (CATHETERS) ×1 IMPLANT
CLOSURE PERCLOSE PROSTYLE (VASCULAR PRODUCTS) ×2 IMPLANT
GLIDESHEATH SLEND SS 6F .021 (SHEATH) IMPLANT
GUIDEWIRE INQWIRE 1.5J.035X260 (WIRE) IMPLANT
INQWIRE 1.5J .035X260CM (WIRE)
KIT HEART LEFT (KITS) ×2 IMPLANT
KIT MICROPUNCTURE NIT STIFF (SHEATH) ×1 IMPLANT
PACK CARDIAC CATHETERIZATION (CUSTOM PROCEDURE TRAY) ×2 IMPLANT
SHEATH GLIDE SLENDER 4/5FR (SHEATH) IMPLANT
SHEATH PINNACLE 5F 10CM (SHEATH) ×1 IMPLANT
SHEATH PINNACLE 7F 10CM (SHEATH) ×1 IMPLANT
SHEATH PROBE COVER 6X72 (BAG) ×1 IMPLANT
TRANSDUCER W/STOPCOCK (MISCELLANEOUS) ×2 IMPLANT
TUBING CIL FLEX 10 FLL-RA (TUBING) ×2 IMPLANT
WIRE EMERALD 3MM-J .035X150CM (WIRE) ×1 IMPLANT

## 2021-08-06 NOTE — Interval H&P Note (Signed)
History and Physical Interval Note: ? ?08/06/2021 ?6:32 PM ? ?[name]  has presented today for surgery, with the diagnosis of cad - angina.  The various methods of treatment have been discussed with the patient and family. After consideration of risks, benefits and other options for treatment, the patient has consented to  Procedure(s): ?RIGHT/LEFT HEART CATH AND CORONARY/GRAFT ANGIOGRAPHY (N/A) as a surgical intervention.  The patient's history has been reviewed, patient examined, no change in status, stable for surgery.  I have reviewed the patient's chart and labs.  Questions were answered to the patient's satisfaction.   ? ? ?Tonny Bollman ? ? ?

## 2021-08-07 ENCOUNTER — Encounter (HOSPITAL_COMMUNITY): Payer: Self-pay | Admitting: Cardiovascular Disease

## 2021-08-09 ENCOUNTER — Telehealth: Payer: Self-pay | Admitting: Physician Assistant

## 2021-08-09 NOTE — Telephone Encounter (Signed)
71 yo with CAD underwent cath on 5/3. ?He called with pain in his R leg and had concerns it was from his cath. ?He has a burning sensation in his medial thigh. ?He does not have any pain at his groin site or swelling, pulsation, redness.  He does not have a fever.  His pain comes and goes. It is brought on by walking. ? ?It does not sound like his symptoms are related to a complication from his cath.  We discussed the signs and symptoms of complications from a cardiac cath.   ? ?I recommended that if his pain worsens, he should go to the ED. ? ?Tereso Newcomer, PA-C    ?08/09/2021 5:13 PM   ?

## 2021-08-11 ENCOUNTER — Encounter: Payer: Self-pay | Admitting: Physician Assistant

## 2021-08-11 ENCOUNTER — Ambulatory Visit (HOSPITAL_COMMUNITY): Payer: Medicare HMO | Attending: Cardiology

## 2021-08-11 DIAGNOSIS — E1165 Type 2 diabetes mellitus with hyperglycemia: Secondary | ICD-10-CM | POA: Diagnosis not present

## 2021-08-11 DIAGNOSIS — I25119 Atherosclerotic heart disease of native coronary artery with unspecified angina pectoris: Secondary | ICD-10-CM

## 2021-08-11 DIAGNOSIS — E785 Hyperlipidemia, unspecified: Secondary | ICD-10-CM | POA: Diagnosis not present

## 2021-08-11 DIAGNOSIS — N1831 Chronic kidney disease, stage 3a: Secondary | ICD-10-CM | POA: Diagnosis not present

## 2021-08-11 DIAGNOSIS — I10 Essential (primary) hypertension: Secondary | ICD-10-CM

## 2021-08-11 DIAGNOSIS — I5030 Unspecified diastolic (congestive) heart failure: Secondary | ICD-10-CM

## 2021-08-11 DIAGNOSIS — I77810 Thoracic aortic ectasia: Secondary | ICD-10-CM

## 2021-08-11 HISTORY — DX: Unspecified diastolic (congestive) heart failure: I50.30

## 2021-08-11 HISTORY — DX: Thoracic aortic ectasia: I77.810

## 2021-08-11 LAB — ECHOCARDIOGRAM COMPLETE
Area-P 1/2: 3.81 cm2
S' Lateral: 4.1 cm

## 2021-08-11 MED ORDER — PERFLUTREN LIPID MICROSPHERE
1.0000 mL | INTRAVENOUS | Status: AC | PRN
  Administered 2021-08-11: 2 mL via INTRAVENOUS

## 2021-08-19 NOTE — Progress Notes (Signed)
?Cardiology Office Note:   ? ?Date:  08/20/2021  ? ?ID:  Edson Snowball, DOB 1950/10/27, MRN [id number] ? ?PCP:  Celene Squibb, MD  ?[facility] HeartCare Providers ?Cardiologist:  Sherren Mocha, MD ?Cardiology APP:  Liliane Shi, PA-C    ?Referring MD: Celene Squibb, MD  ? ?Chief Complaint:  Hospitalization Follow-up (S/p cardiac catheterization ) ?  ? ?Patient Profile: ?Coronary artery disease  ?S/p CABG in 2009 ?Cath 6/21: patent grafts, Med Rx  ?(HFpEF) heart failure with preserved ejection fraction  ?Hypertension  ?Hyperlipidemia  ?Diabetes mellitus  ?Hx of diabetic foot ulcer ?PAD  ?eval by Dr. Fletcher Anon in 2019 >> small vessel dz >> med Rx  ?S/p cryptogenic CVA 01/2019 ?2 vascular territories (R temporoparietal and occipital) - ?cardioembolic  ?Event monitor done by PCP - No AFib  ?Pt declined ref to EP for ILR ?Chronic kidney disease  ? ?Prior CV Studies: ?RIGHT/LEFT HEART CATH AND CORONARY/GRAFT ANGIOGRAPHY 08/06/2021 ?1.  Severe native three-vessel coronary artery disease with total occlusion of the proximal LAD, occlusion of the circumflex, and severe subtotal stenosis of the native RCA ?2.  Status post aortocoronary bypass surgery with continued patency of the LIMA to LAD, sequential saphenous vein graft to OM and RPL branches, vein graft to diagonal, and saphenous vein graft to right PDA ?3.  There is mild to moderate 40 to 50% stenosis in the mid body of the sequential saphenous vein graft to OM/PLA branches ?4.  Mildly elevated intracardiac filling pressures with LVEDP of 21 mmHg, mean PA pressure of 25 mmHg, pulmonary wedge V wave of 24 mmHg, and mean pulmonary wedge pressure of 14 mmHg ? ? ?ECHO COMPLETE WITH IMAGING ENHANCING AGENT 08/11/2021 ?EF 50-55, no RWMA, GRII DD, mild reduced RVSF, RVSP 29.8 (normal PASP), mild LAE, trivial MR, trivial AI, mildly dilated ascending aorta (41 mm) ?    ?Event monitor 02/2019 ?Sinus rhythm, HR 48-99 ?No atrial fibrillation ?  ?Carotid US 01/31/2019 ?IMPRESSION: ?Color  duplex indicates minimal heterogeneous and calcified plaque, ?with no hemodynamically significant stenosis by duplex criteria in ?the extracranial cerebrovascular circulation. ?  ?Nuclear stress test 10/29/2017 ?EF 50, fixed septal/anteroseptal, lateral/inferolateral defect suggesting artifact, no ischemia, low risk ?  ? ?History of Present Illness:   ?[name] is a 71 y.o. male with the above problem list.  He was last seen 07/28/21 with exertional chest pain, shortness of breath and leg edema.  I increased his nitrates and furosemide.  A BNP was minimally elevated.  I set him up for a cardiac catheterization and echocardiogram.   The R and L cardiac catheterization demonstrated patent bypass grafts and mildly elevated filling pressures with LVEDP 21, mean PAP 25 and mean PCWP 14.  Continued diuresis was recommended.  A f/u echocardiogram demonstrated low normal EF, mod diastolic dysfunction, mild ascending aorta dilation.  He returns for f/u.  He is here with his wife.  Overall, he has been doing well without chest discomfort.  He still is short of breath with certain activities.  He sleeps on 2 pillows.  His lower extreme edema is improved.  He does note that most of his swelling improves with elevation.  He has not had syncope. ?   ?Past Medical History:  ?Diagnosis Date  ? (HFpEF) heart failure with preserved ejection fraction ([place]) 08/11/2021  ? Echo 08/2021: EF 50-55, no RWMA, GR 2 DD, mildly reduced RVSF, normal PASP, RVSP 29.8, mild LAE, trivial MR, trivial AI, mild dilation of ascending aorta (41 mm)  ?  Allergy   ? Ascending aorta dilation ([place]) 08/11/2021  ? Echo 08/2021: EF 50-55, no RWMA, GR 2 DD, mildly reduced RVSF, normal PASP, RVSP 29.8, mild LAE, trivial MR, trivial AI, mild dilation of ascending aorta (41 mm)  ? CAD (coronary artery disease), native coronary artery   ? Multiple PCI procedures followed by CABG in 2009 with LIMA-LAD, SVG-D, seq SVG-OM and PLV, SVG-PDA. Echo (3/12): EF 60-65%, mild  MR. // Nuc Stress Test 7/19:  EF 50, artifact, no ischemia, low risk   ? Carotid artery disease ([place]) 01/31/2017  ? dopplers 1/14:  0-39% bilat  ? Carotid stenosis   ? dopplers 1/14:  0-39% bilat  ? Cataract   ? REMOVED  ? Colon polyps 2006  ? Colonoscopy-Dr. Patterson(Tubular Adenoma)   ? Coronary Artery Disease 06/03/2008  ? Annotation: S/P CABG 2009 Qualifier: Diagnosis of  By: Olevia Perches, MD, Glenetta Hew   ? DM2 (diabetes mellitus, type 2) ([place])   ? Followed by Dr. Carlis Abbott  ? Essential hypertension 06/03/2008  ? Qualifier: Diagnosis of  By: Olevia Perches, MD, Glenetta Hew   ? History of echocardiogram   ? Echo 7/19: Mild LVH, EF 90-30, normal diastolic function, trivial AI, moderate LAE  ? HLD (hyperlipidemia)   ? HTN (hypertension)   ? Hx of cardiovascular stress test   ? ETT-Myoview (03/2013):  Anteroseptal reversible defect, EF 54%, intermediate risk.  ? Hyperlipidemia 06/03/2008  ? Qualifier: Diagnosis of  By: Olevia Perches, MD, Glenetta Hew   ? Ischemic stroke ([place]) 01/30/2019  ? OSA (obstructive sleep apnea) 08/25/2011  ? PAD (peripheral artery disease) ([place]) 12/10/2017  ? Postoperative atrial fibrillation ([place]) 2009  ? Renal insufficiency 01/31/2019  ? Stroke [facility])   ? Type 2 diabetes mellitus with complication, with long-term current use of insulin ([place]) 06/03/2008  ? Qualifier: Diagnosis of  By: Olevia Perches, MD, Glenetta Hew   ? Ulcer   ? left foot  ? Varicose veins of lower extremities with other complications 0/12/2328  ? ?Current Medications: ?Current Meds  ?Medication Sig  ? acetaminophen (TYLENOL) 500 MG tablet Take 500 mg by mouth every 6 (six) hours as needed for moderate pain.  ? amitriptyline (ELAVIL) 75 MG tablet Take 75 mg by mouth at bedtime.  ? amLODipine (NORVASC) 10 MG tablet TAKE 1 TABLET BY MOUTH EVERY DAY  ? aspirin 81 MG EC tablet Take 1 tablet (81 mg total) by mouth daily.  ? clopidogrel (PLAVIX) 75 MG tablet TAKE 1 TABLET BY MOUTH EVERY DAY (Patient taking differently: Take 75 mg by mouth  at bedtime.)  ? ezetimibe (ZETIA) 10 MG tablet TAKE 1 TABLET BY MOUTH EVERY DAY  ? fenofibrate 54 MG tablet TAKE 1 TABLET BY MOUTH EVERY DAY (Patient taking differently: Take 54 mg by mouth at bedtime.)  ? glucose monitoring kit (FREESTYLE) monitoring kit 1 each by Does not apply route as needed for other. Freestyle Libre  ? isosorbide mononitrate (IMDUR) 60 MG 24 hr tablet Take 1 tablet (60 mg total) by mouth daily. (Patient taking differently: Take 60 mg by mouth every evening.)  ? metFORMIN (GLUCOPHAGE-XR) 500 MG 24 hr tablet Take 500 mg by mouth 2 (two) times daily.  ? metoprolol tartrate (LOPRESSOR) 100 MG tablet TAKE 1 TABLET BY MOUTH TWICE A DAY  ? nitroGLYCERIN (NITROSTAT) 0.4 MG SL tablet PLACE 1 TABLET UNDER THE TONGUE EVERY 5 MINUTES AS NEEDED FOR CHEST PAIN  ? NOVOLOG MIX 70/30 FLEXPEN (70-30) 100 UNIT/ML Pen Inject 40-60 Units into the  skin 3 (three) times daily. Per sliding scale  ? rosuvastatin (CRESTOR) 5 MG tablet Take 1 tablet (5 mg total) by mouth daily. (Patient taking differently: Take 5 mg by mouth at bedtime.)  ? Semaglutide, 1 MG/DOSE, (OZEMPIC, 1 MG/DOSE,) 2 MG/1.5ML SOPN Inject 1 mg into the skin every Wednesday.  ? [DISCONTINUED] furosemide (LASIX) 40 MG tablet Take 1 tablet by mouth twice a day for 3 days then reduce down to 1 tablet by mouth daily (Patient taking differently: Take 40 mg by mouth daily.)  ?  ?Allergies:   Metformin and related and Penicillins  ? ?Social History  ? ?Tobacco Use  ? Smoking status: Never  ? Smokeless tobacco: Former  ?  Types: Chew  ?  Quit date: 04/06/1993  ?Vaping Use  ? Vaping Use: Never used  ?Substance Use Topics  ? Alcohol use: No  ? Drug use: No  ?  ?Family Hx: ?The patient's family history includes Cancer in his mother; Colon cancer in his mother; Coronary artery disease in an other family member; Diabetes in his brother, father, and mother; Heart disease in his father, mother, and another family member; Hyperlipidemia in his father and mother;  Hypertension in his father; Other in his brother and brother; Prostate cancer in his brother and father. ? ?Review of Systems  ?Gastrointestinal:  Negative for hematochezia.  ?Genitourinary:  Negative for hem

## 2021-08-20 ENCOUNTER — Ambulatory Visit: Payer: Medicare HMO | Admitting: Physician Assistant

## 2021-08-20 ENCOUNTER — Encounter: Payer: Self-pay | Admitting: Physician Assistant

## 2021-08-20 VITALS — BP 114/70 | HR 62 | Ht 69.0 in | Wt 246.4 lb

## 2021-08-20 DIAGNOSIS — E785 Hyperlipidemia, unspecified: Secondary | ICD-10-CM

## 2021-08-20 DIAGNOSIS — I5032 Chronic diastolic (congestive) heart failure: Secondary | ICD-10-CM | POA: Diagnosis not present

## 2021-08-20 DIAGNOSIS — G4733 Obstructive sleep apnea (adult) (pediatric): Secondary | ICD-10-CM

## 2021-08-20 DIAGNOSIS — I10 Essential (primary) hypertension: Secondary | ICD-10-CM | POA: Diagnosis not present

## 2021-08-20 DIAGNOSIS — E1159 Type 2 diabetes mellitus with other circulatory complications: Secondary | ICD-10-CM

## 2021-08-20 DIAGNOSIS — I25119 Atherosclerotic heart disease of native coronary artery with unspecified angina pectoris: Secondary | ICD-10-CM

## 2021-08-20 DIAGNOSIS — I77810 Thoracic aortic ectasia: Secondary | ICD-10-CM | POA: Diagnosis not present

## 2021-08-20 MED ORDER — FUROSEMIDE 40 MG PO TABS
60.0000 mg | ORAL_TABLET | Freq: Every day | ORAL | 3 refills | Status: DC
Start: 2021-08-20 — End: 2022-09-01

## 2021-08-20 NOTE — Patient Instructions (Addendum)
Medication Instructions:  ? ?INCREASE Lasix one and one half tablets by mouth (60 mg) daily.  After your fishing trip. ? ?*If you need a refill on your cardiac medications before your next appointment, please call your pharmacy* ? ? ?Lab Work: ? ?Your physician recommends that you return for a FASTING lipid profile/bmet/lipoprotein A/Apoliprotein B. You can come in on the day of your appointment anytime between 7:30-4:30 fasting from midnight the night before. May 31st.  ? ? ?If you have labs (blood work) drawn today and your tests are completely normal, you will receive your results only by: ?MyChart Message (if you have MyChart) OR ?A paper copy in the mail ?If you have any lab test that is abnormal or we need to change your treatment, we will call you to review the results. ? ? ?Testing/Procedures: ? ?None ordered.  ? ? ?Follow-Up: ?At [facility], you and your health needs are our priority.  As part of our continuing mission to provide you with exceptional heart care, we have created designated Provider Care Teams.  These Care Teams include your primary Cardiologist (physician) and Advanced Practice Providers (APPs -  Physician Assistants and Nurse Practitioners) who all work together to provide you with the care you need, when you need it. ? ?We recommend signing up for the patient portal called "MyChart".  Sign up information is provided on this After Visit Summary.  MyChart is used to connect with patients for Virtual Visits (Telemedicine).  Patients are able to view lab/test results, encounter notes, upcoming appointments, etc.  Non-urgent messages can be sent to your provider as well.   ?To learn more about what you can do with MyChart, go to [email or web address].   ? ?Your next appointment:   ?3 month(s) ? ?The format for your next appointment:   ?In Person ? ?Provider:   ?Tereso Newcomer, PA-C       ? ?Other instructions: ? ?You have been referred to Cardiac Rehab.  That office will call you to set up  appointment. There is a wait.  ? ?Important Information About Sugar ? ? ? ? ?  ?

## 2021-08-20 NOTE — Assessment & Plan Note (Signed)
He has been unable to tolerate the facemask for CPAP.  I will reach out to Dr. Mayford Knife to see if there are any alternatives we can offer him. ?

## 2021-08-20 NOTE — Assessment & Plan Note (Signed)
Recent echocardiogram with ascending aorta dilation of 41 mm.  Repeat echocardiogram planned for 1 year. ?

## 2021-08-20 NOTE — Assessment & Plan Note (Signed)
He has been seen by our lipid clinic.  He has had difficulty tolerating statins in the past.  He currently seems to be doing well on rosuvastatin 5 mg daily.  Continue this along with fenofibrate 54 mg daily.  He has follow-up fasting lipids, apolipoprotein B and lipoprotein a pending.  I will obtain these with his follow-up BMET. ?

## 2021-08-20 NOTE — Assessment & Plan Note (Signed)
Well-controlled.  Continue amlodipine 10 mg daily, isosorbide mononitrate 60 mg daily, metoprolol tartrate 100 mg twice daily. ?

## 2021-08-20 NOTE — Assessment & Plan Note (Signed)
As noted, he sees endocrinology soon.  He has had difficulty tolerating metformin.  If he comes off of metformin, an SGLT2 inhibitor would also benefit his heart failure. ?

## 2021-08-20 NOTE — Assessment & Plan Note (Signed)
Low normal EF by recent echocardiogram with moderate diastolic dysfunction.  He did have mildly elevated filling pressures on his right heart catheterization.  Ongoing diuresis was recommended.  He continues to have lower extremity edema.  Most of this seems to improve with elevation.  He does remain short of breath and describes NYHA IIb symptoms.  We discussed the benefit of additional therapies including SGLT2 inhibitors, ARNIs and spironolactone.  He does note difficulty tolerating metformin.  He sees his endocrinologist soon.  It may be worthwhile trialing him again on SGLT2 inhibitor which would also have CV benefit. ?? Increase Furosemide to 60 mg once daily ?? BMET 1 week ?? F/u 3 mos ?? Consider SGLT2i, ARNI or MRA at f/u OV ?

## 2021-08-20 NOTE — Assessment & Plan Note (Signed)
Recent cardiac catheterization with native 3 v CAD (pLAD 100, LCx 100, severe sub-total stenosis of RCA) and patent bypass grafts (L-LAD, S-OM/RPL w mid 40-50, S-Dx, S-rPDA).  Med Rx has been recommended.  He is doing well without anginal symptoms on his current medical regimen.  He would like to increase activity and I think cardiac rehabilitation would be a good venue for him to exercise.  He has remained on long term DAPT and seems to be tolerating it well. ?? Continue Amlodipine 10 mg once daily, Imdur 60 mg once daily, metoprolol tartrate 100 mg twice daily, ASA 81 mg once daily, clopidogrel 75 mg once daily, rosuvastatin 5 mg once daily.  ?? Refer to cardiac rehabilitation  ?? F/u 3 mos.  ?

## 2021-08-21 ENCOUNTER — Telehealth: Payer: Self-pay | Admitting: *Deleted

## 2021-08-21 NOTE — Telephone Encounter (Signed)
[place], [place]    08/21/2021 8:13 AM

## 2021-08-21 NOTE — Telephone Encounter (Signed)
Call placed to pt regarding OSA and the need for a sleep study. Pt states he just went through that 2 years ago and he couldn't make it happen at the [facility]. Pt was advised that per Dr. Radford Pax, he wasn't a candidate for anything else.   Pt advised that he wanted to think about it and he would call us back if he decided to move forward.

## 2021-08-21 NOTE — Telephone Encounter (Signed)
-----   Message from Liliane Shi, [place] sent at 08/21/2021  7:50 AM EDT ----- Regarding: FW: OSA [place]  Can you let him know that I reviewed with Dr. Radford Pax and she wanted him set up for a sleep study then to see her?  Please arrange a split night sleep study ([place] to read) and then f/u with Dr. Radford Pax after for sleep apnea. Thanks! [name]  ----- Message ----- From: Sueanne Margarita, MD Sent: 08/20/2021   6:11 PM EDT To: Freada Bergeron, CMA, Liliane Shi, PA-C Subject: RE: OSA                                        DUe to his BMI>32 he is not a candidate for anything else - please order a split night sleep study with me as the reading MD and I can see him after it ----- Message ----- From: Liliane Shi, PA-C Sent: 08/20/2021  12:03 PM EDT To: Sueanne Margarita, MD, Freada Bergeron, CMA Subject: OSA                                            Hi Traci, [name] had a sleep study ordered by Dr. Merlene Laughter (neuro) in [place] in Dec 2020 that showed severe OSA.  He could not tolerate the facemask and is currently not doing anything for his OSA.  Can I get him in to see you to see if there are any alternatives for him? Thanks! [name]

## 2021-08-25 ENCOUNTER — Telehealth: Payer: Self-pay | Admitting: *Deleted

## 2021-08-25 DIAGNOSIS — G4733 Obstructive sleep apnea (adult) (pediatric): Secondary | ICD-10-CM

## 2021-08-25 NOTE — Telephone Encounter (Signed)
Reached out to patient [name] to discuss the next steps of his care. Split night study ordered.

## 2021-08-25 NOTE — Telephone Encounter (Signed)
-----   Message from Quintella Reichert, MD sent at 08/20/2021  6:11 PM EDT ----- Regarding: RE: OSA DUe to his BMI>32 he is not a candidate for anything else - please order a split night sleep study with me as the reading MD and I can see him after it ----- Message ----- From: Beatrice Lecher, PA-C Sent: 08/20/2021  12:03 PM EDT To: Quintella Reichert, MD, Reesa Chew, CMA Subject: OSA                                            Hi Traci, [name] had a sleep study ordered by Dr. Gerilyn Pilgrim (neuro) in [place] in Dec 2020 that showed severe OSA.  He could not tolerate the facemask and is currently not doing anything for his OSA.  Can I get him in to see you to see if there are any alternatives for him? Thanks! [name]

## 2021-09-03 ENCOUNTER — Other Ambulatory Visit: Payer: Medicare HMO | Admitting: *Deleted

## 2021-09-03 DIAGNOSIS — I10 Essential (primary) hypertension: Secondary | ICD-10-CM

## 2021-09-03 DIAGNOSIS — I5032 Chronic diastolic (congestive) heart failure: Secondary | ICD-10-CM

## 2021-09-03 DIAGNOSIS — E785 Hyperlipidemia, unspecified: Secondary | ICD-10-CM

## 2021-09-03 DIAGNOSIS — I77810 Thoracic aortic ectasia: Secondary | ICD-10-CM | POA: Diagnosis not present

## 2021-09-03 DIAGNOSIS — I25119 Atherosclerotic heart disease of native coronary artery with unspecified angina pectoris: Secondary | ICD-10-CM

## 2021-09-04 LAB — BASIC METABOLIC PANEL
BUN/Creatinine Ratio: 15 (ref 10–24)
BUN: 23 mg/dL (ref 8–27)
CO2: 26 mmol/L (ref 20–29)
Calcium: 9.8 mg/dL (ref 8.6–10.2)
Chloride: 101 mmol/L (ref 96–106)
Creatinine, Ser: 1.52 mg/dL — ABNORMAL HIGH (ref 0.76–1.27)
Glucose: 199 mg/dL — ABNORMAL HIGH (ref 70–99)
Potassium: 4.6 mmol/L (ref 3.5–5.2)
Sodium: 139 mmol/L (ref 134–144)
eGFR: 50 mL/min/{1.73_m2} — ABNORMAL LOW (ref >59)

## 2021-09-04 LAB — LIPID PANEL
Chol/HDL Ratio: 3.4 ratio (ref 0.0–5.0)
Cholesterol, Total: 118 mg/dL (ref 100–199)
HDL: 35 mg/dL — ABNORMAL LOW (ref >39)
LDL Chol Calc (NIH): 52 mg/dL (ref 0–99)
Triglycerides: 184 mg/dL — ABNORMAL HIGH (ref 0–149)
VLDL Cholesterol Cal: 31 mg/dL (ref 5–40)

## 2021-09-04 LAB — LIPOPROTEIN A (LPA): Lipoprotein (a): <8.4 nmol/L (ref <75.0)

## 2021-09-04 LAB — APOLIPOPROTEIN B: Apolipoprotein B: 68 mg/dL (ref <90)

## 2021-09-11 DIAGNOSIS — E1140 Type 2 diabetes mellitus with diabetic neuropathy, unspecified: Secondary | ICD-10-CM | POA: Diagnosis not present

## 2021-09-11 DIAGNOSIS — E1165 Type 2 diabetes mellitus with hyperglycemia: Secondary | ICD-10-CM | POA: Diagnosis not present

## 2021-09-11 DIAGNOSIS — I2510 Atherosclerotic heart disease of native coronary artery without angina pectoris: Secondary | ICD-10-CM | POA: Diagnosis not present

## 2021-09-11 DIAGNOSIS — I10 Essential (primary) hypertension: Secondary | ICD-10-CM | POA: Diagnosis not present

## 2021-09-11 DIAGNOSIS — E7800 Pure hypercholesterolemia, unspecified: Secondary | ICD-10-CM | POA: Diagnosis not present

## 2021-09-19 ENCOUNTER — Other Ambulatory Visit: Payer: Medicare HMO

## 2021-10-11 DIAGNOSIS — E1165 Type 2 diabetes mellitus with hyperglycemia: Secondary | ICD-10-CM | POA: Diagnosis not present

## 2021-10-17 ENCOUNTER — Encounter: Payer: Self-pay | Admitting: Podiatry

## 2021-10-17 ENCOUNTER — Ambulatory Visit: Payer: Medicare HMO | Admitting: Podiatry

## 2021-10-17 DIAGNOSIS — E1142 Type 2 diabetes mellitus with diabetic polyneuropathy: Secondary | ICD-10-CM

## 2021-10-17 DIAGNOSIS — N289 Disorder of kidney and ureter, unspecified: Secondary | ICD-10-CM | POA: Diagnosis not present

## 2021-10-17 DIAGNOSIS — I739 Peripheral vascular disease, unspecified: Secondary | ICD-10-CM

## 2021-10-17 DIAGNOSIS — B351 Tinea unguium: Secondary | ICD-10-CM | POA: Diagnosis not present

## 2021-10-17 DIAGNOSIS — M79675 Pain in left toe(s): Secondary | ICD-10-CM

## 2021-10-17 DIAGNOSIS — M79674 Pain in right toe(s): Secondary | ICD-10-CM

## 2021-10-17 DIAGNOSIS — I872 Venous insufficiency (chronic) (peripheral): Secondary | ICD-10-CM

## 2021-10-17 NOTE — Progress Notes (Signed)
This patient returns to my office for at risk foot care.  This patient requires this care by a professional since this patient will be at risk due to having Renal insufficiency, CVA, PAD, T2DM.  He is taking plavix.   This patient is unable to cut nails himself since the patient cannot reach his nails.These nails are painful walking and wearing shoes.  This patient presents for at risk foot care today.  General Appearance  Alert, conversant and in no acute stress.  Vascular  Dorsalis pedis and posterior tibial  pulses are absent  bilaterally.  Capillary return is within normal limits  bilaterally. Temperature is within normal limits  bilaterally.  Neurologic  Senn-Weinstein monofilament wire test absent  bilaterally. Muscle power within normal limits bilaterally.  Nails Thick disfigured discolored nails with subungual debris  from hallux to fifth toes bilaterally. No evidence of bacterial infection or drainage bilaterally.  Orthopedic  No limitations of motion  feet .  No crepitus or effusions noted.  No bony pathology or digital deformities noted.HAV  B/L.  DJD midfoot  B/L.    Skin  normotropic skin noted bilaterally.  No signs of infections or ulcers noted.   Asymptomatic callus sub 5 left.  Patient used to have ulcers at these sites.  Onychomycosis  Pain in right toes  Pain in left toes Diabetes wth angiopathy and neurologic deficit.  Consent was obtained for treatment procedures.   Mechanical debridement of nails 1-5  bilaterally performed with a nail nipper.  Filed with dremel without incident. Patient to be scheduled for diabetic orthoses/diabetic shoes.     Return office visit   3 months                   Told patient to return for periodic foot care and evaluation due to potential at risk complications.   Helane Gunther DPM

## 2021-11-04 DIAGNOSIS — U071 COVID-19: Secondary | ICD-10-CM | POA: Diagnosis not present

## 2021-11-11 DIAGNOSIS — E1165 Type 2 diabetes mellitus with hyperglycemia: Secondary | ICD-10-CM | POA: Diagnosis not present

## 2021-11-25 NOTE — Progress Notes (Unsigned)
Cardiology Office Note:    Date:  11/26/2021   ID:  [name], DOB [birth year], MRN [id number]  PCP:  Celene Squibb, MD  [facility] Cardiologist:  Sherren Mocha, MD Cardiology APP:  Sharmon Revere    Referring MD: Celene Squibb, MD   Chief Complaint:  F/u for CAD, CHF    Patient Profile: Coronary artery disease  S/p CABG in 2009 Cath 6/21: patent grafts, Med Rx  Cath 08/2021: patent grafts, Med Rx  (HFpEF) heart failure with preserved ejection fraction  Hypertension  Hyperlipidemia  Diabetes mellitus  Hx of diabetic foot ulcer Peripheral arterial disease   eval by Dr. Fletcher Anon in 2019 >> small vessel dz >> med Rx  S/p cryptogenic CVA 01/2019 2 vascular territories (R temporoparietal and occipital) - ?cardioembolic  Event monitor done by PCP - No AFib  Pt declined ref to EP for ILR Chronic kidney disease  Dilated ascending aorta (echo 08/2021: 41 mm)  Prior CV Studies: RIGHT/LEFT HEART CATH AND CORONARY/GRAFT ANGIOGRAPHY 08/06/2021 1.  Severe native three-vessel coronary artery disease with total occlusion of the proximal LAD, occlusion of the circumflex, and severe subtotal stenosis of the native RCA 2.  Status post aortocoronary bypass surgery with continued patency of the LIMA to LAD, sequential saphenous vein graft to OM and RPL branches, vein graft to diagonal, and saphenous vein graft to right PDA 3.  There is mild to moderate 40 to 50% stenosis in the mid body of the sequential saphenous vein graft to OM/PLA branches 4.  Mildly elevated intracardiac filling pressures with LVEDP of 21 mmHg, mean PA pressure of 25 mmHg, pulmonary wedge V wave of 24 mmHg, and mean pulmonary wedge pressure of 14 mmHg   ECHO COMPLETE WITH IMAGING ENHANCING AGENT 08/11/2021 EF 50-55, no RWMA, GRII DD, mild reduced RVSF, RVSP 29.8 (normal PASP), mild LAE, trivial MR, trivial AI, mildly dilated ascending aorta (41 mm)     Event monitor 02/2019 Sinus rhythm, HR  48-99 No atrial fibrillation   Carotid US 01/31/2019 IMPRESSION: Color duplex indicates minimal heterogeneous and calcified plaque, with no hemodynamically significant stenosis by duplex criteria in the extracranial cerebrovascular circulation.   Nuclear stress test 10/29/2017 EF 50, fixed septal/anteroseptal, lateral/inferolateral defect suggesting artifact, no ischemia, low risk  History of Present Illness:   [name] is a 71 y.o. male with the above problem list.  He was last seen in May 2023.  He returns for follow-up.  He is here alone.  Since April, he is lost 18 pounds.  He has been going to the [facility] and exercising on a regular basis.  He does have some back and knee issues which limit some of his activities.  He has not had chest pain, significant shortness of breath, orthopnea, syncope.  He does have dependent leg edema.  We discussed potentially getting compression hose.       Past Medical History:  Diagnosis Date   (HFpEF) heart failure with preserved ejection fraction ([place]) 08/11/2021   Echo 08/2021: EF 50-55, no RWMA, GR 2 DD, mildly reduced RVSF, normal PASP, RVSP 29.8, mild LAE, trivial MR, trivial AI, mild dilation of ascending aorta (41 mm)   Allergy    Ascending aorta dilation ([place]) 08/11/2021   Echo 08/2021: EF 50-55, no RWMA, GR 2 DD, mildly reduced RVSF, normal PASP, RVSP 29.8, mild LAE, trivial MR, trivial AI, mild dilation of ascending aorta (41 mm)   CAD (coronary artery disease), native coronary artery  Multiple PCI procedures followed by CABG in 2009 with LIMA-LAD, SVG-D, seq SVG-OM and PLV, SVG-PDA. Echo (3/12): EF 60-65%, mild MR. // Nuc Stress Test 7/19:  EF 50, artifact, no ischemia, low risk    Carotid artery disease ([place]) 01/31/2017   dopplers 1/14:  0-39% bilat   Carotid stenosis    dopplers 1/14:  0-39% bilat   Cataract    REMOVED   Colon polyps 2006   Colonoscopy-Dr. Patterson(Tubular Adenoma)    Coronary Artery Disease 06/03/2008   Annotation:  S/P CABG 2009 Qualifier: Diagnosis of  By: Olevia Perches, MD, Glenetta Hew    DM2 (diabetes mellitus, type 2) ([place])    Followed by Dr. Carlis Abbott   Essential hypertension 06/03/2008   Qualifier: Diagnosis of  By: Olevia Perches, MD, Glenetta Hew    History of echocardiogram    Echo 7/19: Mild LVH, EF 26-83, normal diastolic function, trivial AI, moderate LAE   HLD (hyperlipidemia)    HTN (hypertension)    Hx of cardiovascular stress test    ETT-Myoview (03/2013):  Anteroseptal reversible defect, EF 54%, intermediate risk.   Hyperlipidemia 06/03/2008   Qualifier: Diagnosis of  By: Olevia Perches, MD, Glenetta Hew    Ischemic stroke [facility]) 01/30/2019   OSA (obstructive sleep apnea) 08/25/2011   PAD (peripheral artery disease) ([place]) 12/10/2017   Postoperative atrial fibrillation ([place]) 2009   Renal insufficiency 01/31/2019   Stroke [facility])    Type 2 diabetes mellitus with complication, with long-term current use of insulin ([place]) 06/03/2008   Qualifier: Diagnosis of  By: Olevia Perches, MD, Glenetta Hew    Ulcer    left foot   Varicose veins of lower extremities with other complications 07/05/9620   Current Medications: Current Meds  Medication Sig   acetaminophen (TYLENOL) 500 MG tablet Take 500 mg by mouth every 6 (six) hours as needed for moderate pain.   amitriptyline (ELAVIL) 75 MG tablet Take 75 mg by mouth at bedtime.   amLODipine (NORVASC) 10 MG tablet TAKE 1 TABLET BY MOUTH EVERY DAY   aspirin 81 MG EC tablet Take 1 tablet (81 mg total) by mouth daily.   clopidogrel (PLAVIX) 75 MG tablet TAKE 1 TABLET BY MOUTH EVERY DAY   ezetimibe (ZETIA) 10 MG tablet TAKE 1 TABLET BY MOUTH EVERY DAY   fenofibrate 54 MG tablet TAKE 1 TABLET BY MOUTH EVERY DAY   furosemide (LASIX) 40 MG tablet Take 1.5 tablets (60 mg total) by mouth daily.   glucose monitoring kit (FREESTYLE) monitoring kit 1 each by Does not apply route as needed for other. Freestyle Libre   isosorbide mononitrate (IMDUR) 60 MG 24 hr tablet Take 1  tablet (60 mg total) by mouth daily.   metFORMIN (GLUCOPHAGE-XR) 500 MG 24 hr tablet Take 500 mg by mouth 2 (two) times daily.   metoprolol tartrate (LOPRESSOR) 100 MG tablet TAKE 1 TABLET BY MOUTH TWICE A DAY   nitroGLYCERIN (NITROSTAT) 0.4 MG SL tablet PLACE 1 TABLET UNDER THE TONGUE EVERY 5 MINUTES AS NEEDED FOR CHEST PAIN   NOVOLOG MIX 70/30 FLEXPEN (70-30) 100 UNIT/ML Pen Inject 40-60 Units into the skin 3 (three) times daily. Per sliding scale   rosuvastatin (CRESTOR) 5 MG tablet Take 1 tablet (5 mg total) by mouth daily.   Semaglutide, 1 MG/DOSE, (OZEMPIC, 1 MG/DOSE,) 2 MG/1.5ML SOPN Inject 2 mg into the skin every Wednesday.    Allergies:   Metformin and related and Penicillins   Social History   Tobacco Use   Smoking status:  Never   Smokeless tobacco: Former    Types: Chew    Quit date: 04/06/1993  Vaping Use   Vaping Use: Never used  Substance Use Topics   Alcohol use: No   Drug use: No    Family Hx: The patient's family history includes Cancer in his mother; Colon cancer in his mother; Coronary artery disease in an other family member; Diabetes in his brother, father, and mother; Heart disease in his father, mother, and another family member; Hyperlipidemia in his father and mother; Hypertension in his father; Other in his brother and brother; Prostate cancer in his brother and father.  Review of Systems  Gastrointestinal:  Negative for hematochezia.  Genitourinary:  Negative for hematuria.     EKGs/Labs/Other Test Reviewed:    EKG:  EKG is not ordered today.  The ekg ordered today demonstrates n/a  Recent Labs: 07/28/2021: NT-Pro BNP 395; Platelets 249 08/06/2021: Hemoglobin 14.3; Hemoglobin 14.3 09/03/2021: BUN 23; Creatinine, Ser 1.52; Potassium 4.6; Sodium 139   Recent Lipid Panel Recent Labs    09/03/21 0958  CHOL 118  TRIG 184*  HDL 35*  LDLCALC 52     Risk Assessment/Calculations/Metrics:              Physical Exam:    VS:  BP 126/62   Pulse 64    Ht _0  (1.778 m)   Wt 241 lb 3.2 oz (109.4 kg)   SpO2 96%   BMI 34.61 kg/m     Wt Readings from Last 3 Encounters:  11/26/21 241 lb 3.2 oz (109.4 kg)  08/20/21 246 lb 6.4 oz (111.8 kg)  08/06/21 237 lb (107.5 kg)    Constitutional:      Appearance: Healthy appearance. Not in distress.  Neck:     Vascular: No JVR. JVD normal.  Pulmonary:     Effort: Pulmonary effort is normal.     Breath sounds: No wheezing. No rales.  Cardiovascular:     Normal rate. Regular rhythm. Normal S1. Normal S2.      Murmurs: There is no murmur.  Edema:    Peripheral edema present.    Pretibial: bilateral 1+ edema of the pretibial area. Abdominal:     Palpations: Abdomen is soft.  Skin:    General: Skin is warm and dry.  Neurological:     General: No focal deficit present.     Mental Status: Alert and oriented to person, place and time.         ASSESSMENT & PLAN:   Coronary artery disease involving native coronary artery of native heart with angina pectoris ([place]) History of bypass in 2009.  Cardiac catheterization May 2023 with patent bypass grafts.  He is doing well without anginal symptoms.  Continue aspirin 81 mg daily, clopidogrel 75 mg daily, rosuvastatin 5 mg daily.  Follow-up in 6 months.  Ascending aorta dilation (HCC) 41 mm by echocardiogram May 2023.  Repeat echocardiogram pending May 2024.  (HFpEF) heart failure with preserved ejection fraction (HCC) Overall, volume status stable.  We discussed the rationale for SGLT2 inhibitors, MRAs and ARNI's.  He is currently doing well and prefers to hold off on any further medication changes.  I think this is reasonable.  Continue furosemide 60 mg daily.  Follow-up in 6 months  Essential hypertension The patient's blood pressure is controlled on his current regimen.  Continue current therapy.   Hyperlipidemia LDL goal <70 LDL optimal.  Continue rosuvastatin 5 mg daily, ezetimibe 10 mg daily.  CKD (chronic  kidney disease) Recent  creatinine stable.  OSA (obstructive sleep apnea) Previously, we recommended overnight CPAP titration.  He declines further evaluation at this time.            Dispo:  Return in about 6 months (around 05/29/2022) for Routine Follow Up, w/ [name], PA-C.   Medication Adjustments/Labs and Tests Ordered: Current medicines are reviewed at length with the patient today.  Concerns regarding medicines are outlined above.  Tests Ordered: Orders Placed This Encounter  Procedures   ECHOCARDIOGRAM COMPLETE   Medication Changes: No orders of the defined types were placed in this encounter.  Signed, [name], PA-C  11/26/2021 8:57 AM    [facility], [place],   [address] Phone: [phone]; Fax: [phone]

## 2021-11-26 ENCOUNTER — Ambulatory Visit: Payer: Medicare HMO | Admitting: Physician Assistant

## 2021-11-26 ENCOUNTER — Encounter: Payer: Self-pay | Admitting: Physician Assistant

## 2021-11-26 VITALS — BP 126/62 | HR 64 | Ht 70.0 in | Wt 241.2 lb

## 2021-11-26 DIAGNOSIS — G4733 Obstructive sleep apnea (adult) (pediatric): Secondary | ICD-10-CM | POA: Diagnosis not present

## 2021-11-26 DIAGNOSIS — I25119 Atherosclerotic heart disease of native coronary artery with unspecified angina pectoris: Secondary | ICD-10-CM

## 2021-11-26 DIAGNOSIS — I77810 Thoracic aortic ectasia: Secondary | ICD-10-CM

## 2021-11-26 DIAGNOSIS — I5032 Chronic diastolic (congestive) heart failure: Secondary | ICD-10-CM | POA: Diagnosis not present

## 2021-11-26 DIAGNOSIS — I10 Essential (primary) hypertension: Secondary | ICD-10-CM

## 2021-11-26 DIAGNOSIS — E785 Hyperlipidemia, unspecified: Secondary | ICD-10-CM

## 2021-11-26 DIAGNOSIS — N1831 Chronic kidney disease, stage 3a: Secondary | ICD-10-CM | POA: Diagnosis not present

## 2021-11-26 NOTE — Assessment & Plan Note (Signed)
41 mm by echocardiogram May 2023.  Repeat echocardiogram pending May 2024.

## 2021-11-26 NOTE — Assessment & Plan Note (Signed)
History of bypass in 2009.  Cardiac catheterization May 2023 with patent bypass grafts.  He is doing well without anginal symptoms.  Continue aspirin 81 mg daily, clopidogrel 75 mg daily, rosuvastatin 5 mg daily.  Follow-up in 6 months.

## 2021-11-26 NOTE — Assessment & Plan Note (Signed)
Overall, volume status stable.  We discussed the rationale for SGLT2 inhibitors, MRAs and ARNI's.  He is currently doing well and prefers to hold off on any further medication changes.  I think this is reasonable.  Continue furosemide 60 mg daily.  Follow-up in 6 months

## 2021-11-26 NOTE — Assessment & Plan Note (Signed)
LDL optimal.  Continue rosuvastatin 5 mg daily, ezetimibe 10 mg daily.

## 2021-11-26 NOTE — Patient Instructions (Addendum)
Medication Instructions:  Your physician recommends that you continue on your current medications as directed. Please refer to the Current Medication list given to you today.  *If you need a refill on your cardiac medications before your next appointment, please call your pharmacy*   Lab Work: None ordered  If you have labs (blood work) drawn today and your tests are completely normal, you will receive your results only by: MyChart Message (if you have MyChart) OR A paper copy in the mail If you have any lab test that is abnormal or we need to change your treatment, we will call you to review the results.   Testing/Procedures: Your physician has requested that you have an echocardiogram IN MAY 2024.  Echocardiography is a painless test that uses sound waves to create images of your heart. It provides your doctor with information about the size and shape of your heart and how well your heart's chambers and valves are working. This procedure takes approximately one hour. There are no restrictions for this procedure.    Follow-Up: At [facility], you and your health needs are our priority.  As part of our continuing mission to provide you with exceptional heart care, we have created designated Provider Care Teams.  These Care Teams include your primary Cardiologist (physician) and Advanced Practice Providers (APPs -  Physician Assistants and Nurse Practitioners) who all work together to provide you with the care you need, when you need it.  We recommend signing up for the patient portal called "MyChart".  Sign up information is provided on this After Visit Summary.  MyChart is used to connect with patients for Virtual Visits (Telemedicine).  Patients are able to view lab/test results, encounter notes, upcoming appointments, etc.  Non-urgent messages can be sent to your provider as well.   To learn more about what you can do with MyChart, go to [email or web address].    Your next  appointment:   6 month(s)  The format for your next appointment:   In Person  Provider:   Tonny Bollman, MD  or Tereso Newcomer, PA-C         Other Instructions   Important Information About Sugar

## 2021-11-26 NOTE — Assessment & Plan Note (Signed)
Recent creatinine stable. 

## 2021-11-26 NOTE — Assessment & Plan Note (Signed)
The patient's blood pressure is controlled on his current regimen.  Continue current therapy.  

## 2021-11-26 NOTE — Assessment & Plan Note (Signed)
Previously, we recommended overnight CPAP titration.  He declines further evaluation at this time.

## 2021-12-04 ENCOUNTER — Ambulatory Visit (INDEPENDENT_AMBULATORY_CARE_PROVIDER_SITE_OTHER): Payer: Medicare HMO

## 2021-12-04 DIAGNOSIS — E1142 Type 2 diabetes mellitus with diabetic polyneuropathy: Secondary | ICD-10-CM

## 2021-12-04 DIAGNOSIS — I739 Peripheral vascular disease, unspecified: Secondary | ICD-10-CM

## 2021-12-04 DIAGNOSIS — E1159 Type 2 diabetes mellitus with other circulatory complications: Secondary | ICD-10-CM

## 2021-12-04 NOTE — Progress Notes (Signed)
Patient presents to the office today for diabetic shoe and insole measuring.  Patient was measured with brannock device to determine size and width for 1 pair of extra depth shoes and foam casted for 3 pair of insoles.   Documentation of medical necessity will be sent to patient's treating diabetic doctor to verify and sign.   Patient's diabetic provider: Dorisann Frames, MD  Shoes and insoles will be ordered at that time and patient will be notified for an appointment for fitting when they arrive.   Shoe size (per patient): 11 4E wide men's   Brannock measurement: 11 4E x-wide men's   Patient shoe selection-   1st choice:   G720M Apex Active Walker  2nd choice:  V854M Apex Athletic Walker  Shoe size ordered: 11 x-wide men's

## 2021-12-12 DIAGNOSIS — E1165 Type 2 diabetes mellitus with hyperglycemia: Secondary | ICD-10-CM | POA: Diagnosis not present

## 2022-01-01 ENCOUNTER — Ambulatory Visit: Payer: Medicare HMO | Admitting: *Deleted

## 2022-01-01 ENCOUNTER — Encounter: Payer: Self-pay | Admitting: *Deleted

## 2022-01-01 DIAGNOSIS — M9905 Segmental and somatic dysfunction of pelvic region: Secondary | ICD-10-CM | POA: Diagnosis not present

## 2022-01-01 DIAGNOSIS — M9902 Segmental and somatic dysfunction of thoracic region: Secondary | ICD-10-CM | POA: Diagnosis not present

## 2022-01-01 DIAGNOSIS — M9903 Segmental and somatic dysfunction of lumbar region: Secondary | ICD-10-CM | POA: Diagnosis not present

## 2022-01-01 DIAGNOSIS — M62830 Muscle spasm of back: Secondary | ICD-10-CM | POA: Diagnosis not present

## 2022-01-01 NOTE — Patient Instructions (Signed)
Visit Information  Thank you for taking time to visit with me today. Please don't hesitate to contact me if I can be of assistance to you.   Please call the care guide team at [phone] if you need to cancel or reschedule your appointment.   If you are experiencing a Mental Health or Behavioral Health Crisis or need someone to talk to, please call the Suicide and Crisis Lifeline: 988 call the [place] National Suicide Prevention Lifeline: 1-800-273-8255 or TTY: 1-800-799-4 TTY (1-800-799-4889) to talk to a trained counselor call 1-800-273-TALK (toll free, 24 hour hotline) go to [facility] [address], [place] ([phone]) call the [place] Crisis Line: 800-939-9988 call 911  Patient verbalizes understanding of instructions and care plan provided today and agrees to view in MyChart. Active MyChart status and patient understanding of how to access instructions and care plan via MyChart confirmed with patient.     No further follow up required.  Jaidee Stipe, BSW, MSW, LCSW  Licensed Clinical Social Worker  [facility]  Mailing Address-[address], [place], [place] [address] Physical Address-[address], [place], [place] [address] Toll Free Main # [phone] Fax # [phone] Cell # [phone] [email or web address]            

## 2022-01-01 NOTE — Patient Outreach (Signed)
  Care Coordination   Initial Visit Note   01/01/2022  Name: [name] MRN: [id number] DOB: Dec 03, 1950  [name] is a 71 y.o. year old male who sees [place] Crane, Edwinna Areola, MD for primary care. I spoke with [name] by phone today.  What matters to the patients health and wellness today?   No Interventions Identified.  SDOH assessments and interventions completed:  Yes.  SDOH Interventions Today    Flowsheet Row Most Recent Value  SDOH Interventions   Food Insecurity Interventions Intervention Not Indicated  Housing Interventions Intervention Not Indicated  Transportation Interventions Intervention Not Indicated  Utilities Interventions Intervention Not Indicated  Alcohol Usage Interventions Intervention Not Indicated (Score <7)  Financial Strain Interventions Intervention Not Indicated  Physical Activity Interventions Intervention Not Indicated  Stress Interventions Intervention Not Indicated  Social Connections Interventions Intervention Not Indicated     Care Coordination Interventions Activated:  Yes.   Care Coordination Interventions:  Yes, provided.   Follow up plan: No further intervention required.   Encounter Outcome:  Pt. Visit Completed.   Nat Christen, BSW, MSW, LCSW  Licensed Education officer, [facility]  Mailing [place] [address], [place], [place] [address] Physical Address[address], [place], [place] [address] Toll Free Main # [phone] Fax # [phone] Cell # [phone] [email or web address]

## 2022-01-05 DIAGNOSIS — M62830 Muscle spasm of back: Secondary | ICD-10-CM | POA: Diagnosis not present

## 2022-01-05 DIAGNOSIS — M9905 Segmental and somatic dysfunction of pelvic region: Secondary | ICD-10-CM | POA: Diagnosis not present

## 2022-01-05 DIAGNOSIS — M9903 Segmental and somatic dysfunction of lumbar region: Secondary | ICD-10-CM | POA: Diagnosis not present

## 2022-01-05 DIAGNOSIS — M9902 Segmental and somatic dysfunction of thoracic region: Secondary | ICD-10-CM | POA: Diagnosis not present

## 2022-01-09 DIAGNOSIS — M62830 Muscle spasm of back: Secondary | ICD-10-CM | POA: Diagnosis not present

## 2022-01-09 DIAGNOSIS — M9905 Segmental and somatic dysfunction of pelvic region: Secondary | ICD-10-CM | POA: Diagnosis not present

## 2022-01-09 DIAGNOSIS — M9903 Segmental and somatic dysfunction of lumbar region: Secondary | ICD-10-CM | POA: Diagnosis not present

## 2022-01-09 DIAGNOSIS — M9902 Segmental and somatic dysfunction of thoracic region: Secondary | ICD-10-CM | POA: Diagnosis not present

## 2022-01-12 DIAGNOSIS — M62830 Muscle spasm of back: Secondary | ICD-10-CM | POA: Diagnosis not present

## 2022-01-12 DIAGNOSIS — M9903 Segmental and somatic dysfunction of lumbar region: Secondary | ICD-10-CM | POA: Diagnosis not present

## 2022-01-12 DIAGNOSIS — M9902 Segmental and somatic dysfunction of thoracic region: Secondary | ICD-10-CM | POA: Diagnosis not present

## 2022-01-12 DIAGNOSIS — M9905 Segmental and somatic dysfunction of pelvic region: Secondary | ICD-10-CM | POA: Diagnosis not present

## 2022-01-16 DIAGNOSIS — M9905 Segmental and somatic dysfunction of pelvic region: Secondary | ICD-10-CM | POA: Diagnosis not present

## 2022-01-16 DIAGNOSIS — M9903 Segmental and somatic dysfunction of lumbar region: Secondary | ICD-10-CM | POA: Diagnosis not present

## 2022-01-16 DIAGNOSIS — M9902 Segmental and somatic dysfunction of thoracic region: Secondary | ICD-10-CM | POA: Diagnosis not present

## 2022-01-16 DIAGNOSIS — M62830 Muscle spasm of back: Secondary | ICD-10-CM | POA: Diagnosis not present

## 2022-01-17 ENCOUNTER — Telehealth: Payer: Self-pay | Admitting: Podiatry

## 2022-01-17 NOTE — Telephone Encounter (Signed)
LVM for pt to call back for an appt for DM SHOE PICK UP  

## 2022-01-19 DIAGNOSIS — M62830 Muscle spasm of back: Secondary | ICD-10-CM | POA: Diagnosis not present

## 2022-01-19 DIAGNOSIS — M9902 Segmental and somatic dysfunction of thoracic region: Secondary | ICD-10-CM | POA: Diagnosis not present

## 2022-01-19 DIAGNOSIS — M9905 Segmental and somatic dysfunction of pelvic region: Secondary | ICD-10-CM | POA: Diagnosis not present

## 2022-01-19 DIAGNOSIS — M9903 Segmental and somatic dysfunction of lumbar region: Secondary | ICD-10-CM | POA: Diagnosis not present

## 2022-01-22 DIAGNOSIS — M9903 Segmental and somatic dysfunction of lumbar region: Secondary | ICD-10-CM | POA: Diagnosis not present

## 2022-01-22 DIAGNOSIS — M9902 Segmental and somatic dysfunction of thoracic region: Secondary | ICD-10-CM | POA: Diagnosis not present

## 2022-01-22 DIAGNOSIS — M9905 Segmental and somatic dysfunction of pelvic region: Secondary | ICD-10-CM | POA: Diagnosis not present

## 2022-01-22 DIAGNOSIS — M62830 Muscle spasm of back: Secondary | ICD-10-CM | POA: Diagnosis not present

## 2022-01-23 ENCOUNTER — Ambulatory Visit: Payer: Medicare HMO | Admitting: Podiatry

## 2022-01-23 ENCOUNTER — Encounter: Payer: Self-pay | Admitting: Podiatry

## 2022-01-23 DIAGNOSIS — M79675 Pain in left toe(s): Secondary | ICD-10-CM | POA: Diagnosis not present

## 2022-01-23 DIAGNOSIS — E1159 Type 2 diabetes mellitus with other circulatory complications: Secondary | ICD-10-CM | POA: Diagnosis not present

## 2022-01-23 DIAGNOSIS — M79674 Pain in right toe(s): Secondary | ICD-10-CM | POA: Diagnosis not present

## 2022-01-23 DIAGNOSIS — I739 Peripheral vascular disease, unspecified: Secondary | ICD-10-CM

## 2022-01-23 DIAGNOSIS — B351 Tinea unguium: Secondary | ICD-10-CM

## 2022-01-23 DIAGNOSIS — E1165 Type 2 diabetes mellitus with hyperglycemia: Secondary | ICD-10-CM | POA: Diagnosis not present

## 2022-01-23 DIAGNOSIS — E1142 Type 2 diabetes mellitus with diabetic polyneuropathy: Secondary | ICD-10-CM

## 2022-01-23 NOTE — Progress Notes (Signed)
This patient returns to my office for at risk foot care.  This patient requires this care by a professional since this patient will be at risk due to having Renal insufficiency, CVA, PAD, T2DM.  He is taking plavix.   This patient is unable to cut nails himself since the patient cannot reach his nails.These nails are painful walking and wearing shoes.  This patient presents for at risk foot care today.  General Appearance  Alert, conversant and in no acute stress.  Vascular  Dorsalis pedis and posterior tibial  pulses are absent  bilaterally.  Capillary return is within normal limits  bilaterally. Temperature is within normal limits  bilaterally.  Neurologic  Senn-Weinstein monofilament wire test absent  bilaterally. Muscle power within normal limits bilaterally.  Nails Thick disfigured discolored nails with subungual debris  from hallux to fifth toes bilaterally. No evidence of bacterial infection or drainage bilaterally.  Orthopedic  No limitations of motion  feet .  No crepitus or effusions noted.  No bony pathology or digital deformities noted.HAV  B/L.  DJD midfoot  B/L.    Skin  normotropic skin noted bilaterally.  No signs of infections or ulcers noted.   Asymptomatic callus sub 5 left.  Patient used to have ulcers at these sites.  Onychomycosis  Pain in right toes  Pain in left toes Diabetes wth angiopathy and neurologic deficit.  Consent was obtained for treatment procedures.   Mechanical debridement of nails 1-5  bilaterally performed with a nail nipper.  Filed with dremel without incident. Recommend spenco insoles for shoes.   Return office visit    10 weeks                   Told patient to return for periodic foot care and evaluation due to potential at risk complications.   Londyn Hotard DPM   

## 2022-01-26 DIAGNOSIS — M62830 Muscle spasm of back: Secondary | ICD-10-CM | POA: Diagnosis not present

## 2022-01-26 DIAGNOSIS — M9902 Segmental and somatic dysfunction of thoracic region: Secondary | ICD-10-CM | POA: Diagnosis not present

## 2022-01-26 DIAGNOSIS — M9905 Segmental and somatic dysfunction of pelvic region: Secondary | ICD-10-CM | POA: Diagnosis not present

## 2022-01-26 DIAGNOSIS — M9903 Segmental and somatic dysfunction of lumbar region: Secondary | ICD-10-CM | POA: Diagnosis not present

## 2022-01-30 DIAGNOSIS — M9903 Segmental and somatic dysfunction of lumbar region: Secondary | ICD-10-CM | POA: Diagnosis not present

## 2022-01-30 DIAGNOSIS — M9905 Segmental and somatic dysfunction of pelvic region: Secondary | ICD-10-CM | POA: Diagnosis not present

## 2022-01-30 DIAGNOSIS — M9902 Segmental and somatic dysfunction of thoracic region: Secondary | ICD-10-CM | POA: Diagnosis not present

## 2022-01-30 DIAGNOSIS — M62830 Muscle spasm of back: Secondary | ICD-10-CM | POA: Diagnosis not present

## 2022-02-18 ENCOUNTER — Ambulatory Visit (INDEPENDENT_AMBULATORY_CARE_PROVIDER_SITE_OTHER): Payer: Medicare HMO

## 2022-02-18 DIAGNOSIS — E1142 Type 2 diabetes mellitus with diabetic polyneuropathy: Secondary | ICD-10-CM | POA: Diagnosis not present

## 2022-02-18 DIAGNOSIS — M2011 Hallux valgus (acquired), right foot: Secondary | ICD-10-CM

## 2022-02-18 DIAGNOSIS — E1159 Type 2 diabetes mellitus with other circulatory complications: Secondary | ICD-10-CM

## 2022-02-18 DIAGNOSIS — M2012 Hallux valgus (acquired), left foot: Secondary | ICD-10-CM | POA: Diagnosis not present

## 2022-02-18 NOTE — Progress Notes (Signed)
Patient presents today to pick up diabetic shoes and insoles.  Patient was dispensed 1 pair of diabetic shoes and 3 pairs of foam casted diabetic insoles. Fit was satisfactory. Instructions for break-in and wear was reviewed and a copy was given to the patient.   Re-appointment for regularly scheduled diabetic foot care visits or if they should experience any trouble with the shoes or insoles.  

## 2022-02-24 DIAGNOSIS — M1711 Unilateral primary osteoarthritis, right knee: Secondary | ICD-10-CM | POA: Diagnosis not present

## 2022-03-06 DIAGNOSIS — N189 Chronic kidney disease, unspecified: Secondary | ICD-10-CM | POA: Diagnosis not present

## 2022-03-06 DIAGNOSIS — E1165 Type 2 diabetes mellitus with hyperglycemia: Secondary | ICD-10-CM | POA: Diagnosis not present

## 2022-03-06 DIAGNOSIS — I10 Essential (primary) hypertension: Secondary | ICD-10-CM | POA: Diagnosis not present

## 2022-03-06 DIAGNOSIS — E7800 Pure hypercholesterolemia, unspecified: Secondary | ICD-10-CM | POA: Diagnosis not present

## 2022-03-09 LAB — LAB REPORT - SCANNED
A1c: 7.6
Albumin, Urine POC: 3.7
Creatinine, POC: 20.6 mg/dL
EGFR: 61
Microalb Creat Ratio: 18

## 2022-03-12 DIAGNOSIS — E1165 Type 2 diabetes mellitus with hyperglycemia: Secondary | ICD-10-CM | POA: Diagnosis not present

## 2022-03-12 DIAGNOSIS — N189 Chronic kidney disease, unspecified: Secondary | ICD-10-CM | POA: Diagnosis not present

## 2022-03-12 DIAGNOSIS — E7800 Pure hypercholesterolemia, unspecified: Secondary | ICD-10-CM | POA: Diagnosis not present

## 2022-03-12 DIAGNOSIS — I10 Essential (primary) hypertension: Secondary | ICD-10-CM | POA: Diagnosis not present

## 2022-03-12 DIAGNOSIS — E1140 Type 2 diabetes mellitus with diabetic neuropathy, unspecified: Secondary | ICD-10-CM | POA: Diagnosis not present

## 2022-03-12 DIAGNOSIS — I2510 Atherosclerotic heart disease of native coronary artery without angina pectoris: Secondary | ICD-10-CM | POA: Diagnosis not present

## 2022-03-12 DIAGNOSIS — I639 Cerebral infarction, unspecified: Secondary | ICD-10-CM | POA: Diagnosis not present

## 2022-03-16 DIAGNOSIS — Z0001 Encounter for general adult medical examination with abnormal findings: Secondary | ICD-10-CM | POA: Diagnosis not present

## 2022-03-16 DIAGNOSIS — Z951 Presence of aortocoronary bypass graft: Secondary | ICD-10-CM | POA: Diagnosis not present

## 2022-03-16 DIAGNOSIS — E559 Vitamin D deficiency, unspecified: Secondary | ICD-10-CM | POA: Diagnosis not present

## 2022-03-16 DIAGNOSIS — M25561 Pain in right knee: Secondary | ICD-10-CM | POA: Diagnosis not present

## 2022-03-16 DIAGNOSIS — I509 Heart failure, unspecified: Secondary | ICD-10-CM | POA: Diagnosis not present

## 2022-03-16 DIAGNOSIS — N1831 Chronic kidney disease, stage 3a: Secondary | ICD-10-CM | POA: Diagnosis not present

## 2022-03-16 DIAGNOSIS — E782 Mixed hyperlipidemia: Secondary | ICD-10-CM | POA: Diagnosis not present

## 2022-03-16 DIAGNOSIS — I10 Essential (primary) hypertension: Secondary | ICD-10-CM | POA: Diagnosis not present

## 2022-03-16 DIAGNOSIS — E1165 Type 2 diabetes mellitus with hyperglycemia: Secondary | ICD-10-CM | POA: Diagnosis not present

## 2022-03-20 ENCOUNTER — Telehealth: Payer: Self-pay | Admitting: Cardiovascular Disease

## 2022-03-20 NOTE — Telephone Encounter (Signed)
Pt c/o medication issue:  1. Name of Medication: Zlitivekimab  2. How are you currently taking this medication (dosage and times per day)?   3. Are you having a reaction (difficulty breathing--STAT)?   4. What is your medication issue?  The company [employer] is asking if the patient would be interested in a 4 year study of this medication and the patient would like a call back to discuss.

## 2022-03-20 NOTE — Telephone Encounter (Signed)
This study is conducted to see if ziltivekimab reduces the risk of having cardiovascular events (for example heart attack and stroke) in people with cardiovascular disease, chronic kidney disease and inflammation. Participants will either get ziltivekimab (active medicine) or placebo (a dummy medicine which has no effect on the body). This is known as the study medicine. Which treatment participants get is decided by chance. Participants chance of getting ziltivekimab or placebo is the same.  Patient would like to know Dr Earmon Phoenix thoughts on this trial and if he thinks it would be beneficial for him. Routing to MD for review.

## 2022-03-23 ENCOUNTER — Other Ambulatory Visit: Payer: Self-pay | Admitting: Cardiovascular Disease

## 2022-03-23 DIAGNOSIS — E113393 Type 2 diabetes mellitus with moderate nonproliferative diabetic retinopathy without macular edema, bilateral: Secondary | ICD-10-CM | POA: Diagnosis not present

## 2022-03-24 NOTE — Telephone Encounter (Signed)
Patient is following up, requesting feedback from [name] if at all possible.

## 2022-03-25 ENCOUNTER — Encounter: Payer: Self-pay | Admitting: *Deleted

## 2022-03-25 NOTE — Telephone Encounter (Signed)
Please tell pt I will have to research this drug and will get back to him on it. Tereso Newcomer, PA-C    03/25/2022 2:52 PM

## 2022-03-25 NOTE — Telephone Encounter (Signed)
Pt has responded to the [employer] and has been made aware.

## 2022-03-25 NOTE — Telephone Encounter (Signed)
Call placed to pt regarding message below.  No answer / no machine.  Will try sending pt a mychart messsage.

## 2022-03-26 DIAGNOSIS — M1711 Unilateral primary osteoarthritis, right knee: Secondary | ICD-10-CM | POA: Diagnosis not present

## 2022-03-26 DIAGNOSIS — M6530 Trigger finger, unspecified finger: Secondary | ICD-10-CM | POA: Diagnosis not present

## 2022-03-26 DIAGNOSIS — M545 Low back pain, unspecified: Secondary | ICD-10-CM | POA: Diagnosis not present

## 2022-04-02 NOTE — Telephone Encounter (Signed)
Message sent to patient via MyChart. Tereso Newcomer, PA-C    04/02/2022 9:04 AM

## 2022-04-08 ENCOUNTER — Other Ambulatory Visit: Payer: Self-pay | Admitting: Cardiovascular Disease

## 2022-04-08 DIAGNOSIS — M545 Low back pain, unspecified: Secondary | ICD-10-CM | POA: Diagnosis not present

## 2022-04-10 ENCOUNTER — Encounter: Payer: Self-pay | Admitting: Podiatry

## 2022-04-10 ENCOUNTER — Ambulatory Visit (INDEPENDENT_AMBULATORY_CARE_PROVIDER_SITE_OTHER): Payer: No Typology Code available for payment source | Admitting: Podiatry

## 2022-04-10 DIAGNOSIS — I872 Venous insufficiency (chronic) (peripheral): Secondary | ICD-10-CM | POA: Diagnosis not present

## 2022-04-10 DIAGNOSIS — M79674 Pain in right toe(s): Secondary | ICD-10-CM | POA: Diagnosis not present

## 2022-04-10 DIAGNOSIS — E1159 Type 2 diabetes mellitus with other circulatory complications: Secondary | ICD-10-CM

## 2022-04-10 DIAGNOSIS — N289 Disorder of kidney and ureter, unspecified: Secondary | ICD-10-CM | POA: Diagnosis not present

## 2022-04-10 DIAGNOSIS — M79675 Pain in left toe(s): Secondary | ICD-10-CM

## 2022-04-10 DIAGNOSIS — B351 Tinea unguium: Secondary | ICD-10-CM | POA: Diagnosis not present

## 2022-04-10 DIAGNOSIS — I739 Peripheral vascular disease, unspecified: Secondary | ICD-10-CM

## 2022-04-10 NOTE — Progress Notes (Signed)
This patient returns to my office for at risk foot care.  This patient requires this care by a professional since this patient will be at risk due to having Renal insufficiency, CVA, PAD, T2DM.  He is taking plavix.   This patient is unable to cut nails himself since the patient cannot reach his nails.These nails are painful walking and wearing shoes.  This patient presents for at risk foot care today.  General Appearance  Alert, conversant and in no acute stress.  Vascular  Dorsalis pedis and posterior tibial  pulses are absent  bilaterally.  Capillary return is within normal limits  bilaterally. Temperature is within normal limits  bilaterally.  Neurologic  Senn-Weinstein monofilament wire test absent  bilaterally. Muscle power within normal limits bilaterally.  Nails Thick disfigured discolored nails with subungual debris  from hallux to fifth toes bilaterally. No evidence of bacterial infection or drainage bilaterally.  Orthopedic  No limitations of motion  feet .  No crepitus or effusions noted.  No bony pathology or digital deformities noted.HAV  B/L.  DJD midfoot  B/L.    Skin  normotropic skin noted bilaterally.  No signs of infections or ulcers noted.   Asymptomatic callus sub 5 left.  Patient used to have ulcers at these sites.  Onychomycosis  Pain in right toes  Pain in left toes Diabetes wth angiopathy and neurologic deficit.  Consent was obtained for treatment procedures.   Mechanical debridement of nails 1-5  bilaterally performed with a nail nipper.  Filed with dremel without incident. Recommend spenco insoles for shoes.   Return office visit    10 weeks                   Told patient to return for periodic foot care and evaluation due to potential at risk complications.   Gardiner Barefoot DPM

## 2022-04-15 ENCOUNTER — Other Ambulatory Visit: Payer: Self-pay | Admitting: Family Medicine

## 2022-04-15 ENCOUNTER — Other Ambulatory Visit: Payer: Self-pay

## 2022-04-15 DIAGNOSIS — Z006 Encounter for examination for normal comparison and control in clinical research program: Secondary | ICD-10-CM

## 2022-04-15 MED ORDER — ROSUVASTATIN CALCIUM 5 MG PO TABS: 5.0000 mg | ORAL_TABLET | Freq: Every day | ORAL | 2 refills | Status: DC

## 2022-04-20 ENCOUNTER — Telehealth: Payer: Self-pay | Admitting: Cardiovascular Disease

## 2022-04-20 NOTE — Telephone Encounter (Signed)
Pt c/o medication issue:  1. Name of Medication:  ezetimibe (ZETIA) 10 MG tablet  2. How are you currently taking this medication (dosage and times per day)?   3. Are you having a reaction (difficulty breathing--STAT)?   4. What is your medication issue?   Patient states he is insured with [employer] and this year Ezetimibe changed from tier 1 to tier 3, adding a $34/monthly co-payment. Patient would like to discuss less expensive alternatives.

## 2022-04-21 DIAGNOSIS — M545 Low back pain, unspecified: Secondary | ICD-10-CM | POA: Diagnosis not present

## 2022-04-23 MED ORDER — EZETIMIBE 10 MG PO TABS: 10.0000 mg | ORAL_TABLET | Freq: Every day | ORAL | 3 refills | Status: DC

## 2022-04-23 NOTE — Telephone Encounter (Signed)
Called and spoke with patient to inform him that there's not an alternative for zetia. I did look up independent pharmacies near his address and called for price quote for him. I was quoted 28.47/90 tablets at [facility]. Discussed this in detail with patient who is willing to go somewhere else than [facility], but requested we send to [place] instead and he will transfer it if they are not in line with the same prices.

## 2022-04-30 DIAGNOSIS — G5601 Carpal tunnel syndrome, right upper limb: Secondary | ICD-10-CM | POA: Diagnosis not present

## 2022-04-30 DIAGNOSIS — G5602 Carpal tunnel syndrome, left upper limb: Secondary | ICD-10-CM | POA: Diagnosis not present

## 2022-04-30 DIAGNOSIS — M65331 Trigger finger, right middle finger: Secondary | ICD-10-CM | POA: Diagnosis not present

## 2022-05-05 ENCOUNTER — Ambulatory Visit: Payer: Self-pay

## 2022-05-05 DIAGNOSIS — Z006 Encounter for examination for normal comparison and control in clinical research program: Secondary | ICD-10-CM

## 2022-05-12 NOTE — Therapy (Signed)
OUTPATIENT PHYSICAL THERAPY THORACOLUMBAR EVALUATION   Patient Name: [name] MRN: [id number] DOB:11/11/1950, 72 y.o., male Today's Date: 05/15/2022  END OF SESSION:  PT End of Session - 05/15/22 1342     Visit Number 1    Number of Visits 8    Date for PT Re-Evaluation 06/12/22    Authorization Type [employer]- [place]    Authorization Time Period no deductible; no auth required    PT Start Time 1340    PT Stop Time [id number]    PT Time Calculation (min) 40 min    Activity Tolerance Patient tolerated treatment well    Behavior During Therapy WFL for tasks assessed/performed             Past Medical History:  Diagnosis Date   (HFpEF) heart failure with preserved ejection fraction ([place]) 08/11/2021   Echo 08/2021: EF 50-55, no RWMA, GR 2 DD, mildly reduced RVSF, normal PASP, RVSP 29.8, mild LAE, trivial MR, trivial AI, mild dilation of ascending aorta (41 mm)   Allergy    Ascending aorta dilation ([place]) 08/11/2021   Echo 08/2021: EF 50-55, no RWMA, GR 2 DD, mildly reduced RVSF, normal PASP, RVSP 29.8, mild LAE, trivial MR, trivial AI, mild dilation of ascending aorta (41 mm)   CAD (coronary artery disease), native coronary artery    Multiple PCI procedures followed by CABG in 2009 with LIMA-LAD, SVG-D, seq SVG-OM and PLV, SVG-PDA. Echo (3/12): EF 60-65%, mild MR. // Nuc Stress Test 7/19:  EF 50, artifact, no ischemia, low risk    Carotid artery disease ([place]) 01/31/2017   dopplers 1/14:  0-39% bilat   Carotid stenosis    dopplers 1/14:  0-39% bilat   Cataract    REMOVED   Colon polyps 2006   Colonoscopy-Dr. Patterson(Tubular Adenoma)    Coronary Artery Disease 06/03/2008   Annotation: S/P CABG 2009 Qualifier: Diagnosis of  By: Olevia Perches, MD, Glenetta Hew    DM2 (diabetes mellitus, type 2) ([place])    Followed by Dr. Carlis Abbott   Essential hypertension 06/03/2008   Qualifier: Diagnosis of  By: Olevia Perches, MD, Glenetta Hew    History of echocardiogram    Echo 7/19: Mild LVH, EF  123456, normal diastolic function, trivial AI, moderate LAE   HLD (hyperlipidemia)    HTN (hypertension)    Hx of cardiovascular stress test    ETT-Myoview (03/2013):  Anteroseptal reversible defect, EF 54%, intermediate risk.   Hyperlipidemia 06/03/2008   Qualifier: Diagnosis of  By: Olevia Perches, MD, Glenetta Hew    Ischemic stroke ([place]) 01/30/2019   OSA (obstructive sleep apnea) 08/25/2011   PAD (peripheral artery disease) ([place]) 12/10/2017   Postoperative atrial fibrillation ([place]) 2009   Renal insufficiency 01/31/2019   Stroke [facility])    Type 2 diabetes mellitus with complication, with long-term current use of insulin ([place]) 06/03/2008   Qualifier: Diagnosis of  By: Olevia Perches, MD, Glenetta Hew    Ulcer    left foot   Varicose veins of lower extremities with other complications [id number]   Past Surgical History:  Procedure Laterality Date   APPENDECTOMY  2005   BACK SURGERY  2006   CARDIAC CATHETERIZATION  2009   EF 60%   COLONOSCOPY     CORONARY ARTERY BYPASS GRAFT  2009   post op. a fib, LIMA -LAD, SVG-D,seq SVG-OM and PLV,  SVG-PDA.   EYE SURGERY     LEFT HEART CATH AND CORS/GRAFTS ANGIOGRAPHY N/A 10/04/2019   Procedure: LEFT HEART CATH  AND CORS/GRAFTS ANGIOGRAPHY;  Surgeon: Sherren Mocha, MD;  Location: [place] [facility];  Service: Cardiovascular;  Laterality: N/A;   RIGHT/LEFT HEART CATH AND CORONARY/GRAFT ANGIOGRAPHY N/A 08/06/2021   Procedure: RIGHT/LEFT HEART CATH AND CORONARY/GRAFT ANGIOGRAPHY;  Surgeon: Sherren Mocha, MD;  Location: [place] [facility];  Service: Cardiovascular;  Laterality: N/A;   SPINE SURGERY     TONSILLECTOMY     Patient Active Problem List   Diagnosis Date Noted   (HFpEF) heart failure with preserved ejection fraction ([place]) 08/11/2021   Ascending aorta dilation ([place]) 08/11/2021   Shortness of breath 07/28/2021   Pain due to onychomycosis of toenails of both feet 01/08/2021   Diabetic neuropathy ([place]) 01/08/2021   Type 2 diabetes mellitus with  vascular disease ([place]) 01/08/2021   CKD (chronic kidney disease) 01/31/2019   History of stroke 01/30/2019   PAD (peripheral artery disease) ([place]) 12/10/2017   Carotid artery disease ([place]) 01/31/2017   Venous insufficiency 12/13/2013   OSA (obstructive sleep apnea) 08/25/2011   Type 2 diabetes mellitus with complication, with long-term current use of insulin ([place]) 06/03/2008   Hyperlipidemia LDL goal <70 06/03/2008   Essential hypertension 06/03/2008   Coronary artery disease involving native coronary artery of native heart with angina pectoris ([place]) 06/03/2008    PCP: Celene Squibb, MD  REFERRING PROVIDER: Frederik Pear, MD  REFERRING DIAG: [phone] (ICD-10-CM) - Lumbar spinal stenosis  Rationale for Evaluation and Treatment: Rehabilitation  THERAPY DIAG:  Spinal stenosis of lumbar region, unspecified whether neurogenic claudication present - Plan: PT plan of care cert/re-cert  Low back pain, unspecified back pain laterality, unspecified chronicity, unspecified whether sciatica present - Plan: PT plan of care cert/re-cert  ONSET DATE: about a year and half ago  SUBJECTIVE:                                                                                                                                                                                           SUBJECTIVE STATEMENT: Pain in the low back sometimes but always have pain in my legs that increases with standing and walking.  Can only walk about 15 min or so and then burning starts down back of both legs.  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  Gradually worsening.  Prolonged sitting can also cause pain although he can sit longer than he can stand/walk  PERTINENT HISTORY:  Discectomy 2007   PAIN:  Are you having pain? Yes: NPRS scale: 2-8/10 Pain location: mostly low back, buttocks and back of legs and down to back of the knee Pain description: burning Aggravating  factors:  standing, walking Relieving factors: rest, changing positions  PRECAUTIONS: None  WEIGHT BEARING RESTRICTIONS: No  FALLS:  Has patient fallen in last 6 months? No  OCCUPATION: retired  PLOF: Independent  PATIENT GOALS: be able to travel and get around better  NEXT MD VISIT: 06/11/2022  OBJECTIVE:   DIAGNOSTIC FINDINGS:  MRI done; will bring next visit  PATIENT SURVEYS:  FOTO 47   COGNITION: Overall cognitive status: Within functional limits for tasks assessed     SENSATION: Neuropathy in feet and hands   POSTURE: rounded shoulders and forward head  PALPATION:   LUMBAR ROM:   AROM eval  Flexion 80% available  Extension 25%* available  Right lateral flexion   Left lateral flexion   Right rotation   Left rotation    (Blank rows = not tested)    LOWER EXTREMITY MMT:    MMT Right eval Left eval  Hip flexion 4+ 4+  Hip extension    Hip abduction    Hip adduction    Hip internal rotation    Hip external rotation    Knee flexion    Knee extension 5 5  Ankle dorsiflexion 5 5  Ankle plantarflexion    Ankle inversion    Ankle eversion     (Blank rows = not tested)  FUNCTIONAL TESTS:  5 times sit to stand: 13.72 sec 2 minute walk test: 349 ft  GAIT: Distance walked: 349 ft Assistive device utilized: None Level of assistance: Modified independence Comments: slower than normal gait speed; increased pain to 5/10  TODAY'S TREATMENT:                                                                                                                              DATE:  05/15/2022 physical therapy evaluation and HEP instruction   PATIENT EDUCATION:  Education details: Patient educated on exam findings, POC, scope of PT, HEP. Person educated: Patient Education method: Explanation, Demonstration, and Handouts Education comprehension: verbalized understanding, returned demonstration, verbal cues required, and tactile cues required   HOME EXERCISE  PROGRAM: Access Code: [email or web address] URL: [email or web address] Date: 05/15/2022 Prepared by: AP - Rehab  Exercises - Supine Lower Trunk Rotation  - 2 x daily - 7 x weekly - 3 sets - 10 reps - Supine Transversus Abdominis Bracing - Hands on Stomach  - 2 x daily - 7 x weekly - 3 sets - 10 reps - Hooklying Single Knee to Chest Stretch  - 2 x daily - 7 x weekly - 3 sets - 10 reps  ASSESSMENT:  CLINICAL IMPRESSION: Patient is a 72 y.o. male who was seen today for physical therapy evaluation and treatment for M48.061 (ICD-10-CM) - Lumbar spinal stenosis. Patient  presents to physical therapy with complaint of low back and leg pain. Patient demonstrates muscle weakness, reduced ROM, and fascial restrictions which are likely contributing to symptoms of pain and are negatively impacting patient ability to perform ADLs and functional mobility  tasks. Patient will benefit from skilled physical therapy services to address these deficits to reduce pain and improve level of function with ADLs and functional mobility tasks.   OBJECTIVE IMPAIRMENTS: Abnormal gait, decreased activity tolerance, decreased endurance, decreased mobility, difficulty walking, decreased ROM, decreased strength, hypomobility, increased fascial restrictions, impaired perceived functional ability, impaired flexibility, and pain.   ACTIVITY LIMITATIONS: carrying, lifting, bending, sitting, standing, squatting, sleeping, stairs, transfers, locomotion level, and caring for others  PARTICIPATION LIMITATIONS: meal prep, cleaning, laundry, community activity, and yard work  [employer] POTENTIAL: Good  CLINICAL DECISION MAKING: Stable/uncomplicated  EVALUATION COMPLEXITY: Low   GOALS: Goals reviewed with patient? No  SHORT TERM GOALS: Target date: 05/29/2022  patient will be independent with initial HEP   Baseline: Goal status: INITIAL  2.  Patient will self report 30% improvement to improve tolerance for functional  activity   Baseline:  Goal status: INITIAL  LONG TERM GOALS: Target date: 06/22/2022  Patient will be independent in self management strategies to improve quality of life and functional outcomes. Baseline:  Goal status: INITIAL  2.  Patient will self report 50% improvement to improve tolerance for functional activity   Baseline:  Goal status: INITIAL  3.  Patient will improve FOTO score to predicted value   Baseline: 47 Goal status: INITIAL  4.  Patient will ambulate x 15 min without leg pain to so light shopping without issue Baseline:  Goal status: INITIAL  5.  Patient will increase distance on 2 MWT to 400 ft to demonstrate improved functional mobility with community ambulation Baseline: 349 ft Goal status: INITIAL  PLAN:  PT FREQUENCY: 2x/week  PT DURATION: 4 weeks  PLANNED INTERVENTIONS: Therapeutic exercises, Therapeutic activity, Neuromuscular re-education, Balance training, Gait training, Patient/Family education, Joint manipulation, Joint mobilization, Stair training, Orthotic/Fit training, DME instructions, Aquatic Therapy, Dry Needling, Electrical stimulation, Spinal manipulation, Spinal mobilization, Cryotherapy, Moist heat, Compression bandaging, scar mobilization, Splintting, Taping, Traction, Ultrasound, Ionotophoresis 25m/ml Dexamethasone, and Manual therapy .  PLAN FOR NEXT SESSION: Review HEP and goals; progress core stabilization as able; test glute and hip extension strength   3:14 PM, 05/15/22 Christne Platts Small Melanie Pellot MPT Normal physical therapy [place] #[phone]

## 2022-05-15 ENCOUNTER — Ambulatory Visit (HOSPITAL_COMMUNITY): Payer: No Typology Code available for payment source | Attending: Internal Medicine

## 2022-05-15 ENCOUNTER — Other Ambulatory Visit: Payer: Self-pay

## 2022-05-15 DIAGNOSIS — M48061 Spinal stenosis, lumbar region without neurogenic claudication: Secondary | ICD-10-CM | POA: Diagnosis not present

## 2022-05-15 DIAGNOSIS — M545 Low back pain, unspecified: Secondary | ICD-10-CM | POA: Diagnosis not present

## 2022-05-19 ENCOUNTER — Ambulatory Visit (HOSPITAL_COMMUNITY): Payer: No Typology Code available for payment source | Admitting: Physical Therapy

## 2022-05-19 DIAGNOSIS — M48061 Spinal stenosis, lumbar region without neurogenic claudication: Secondary | ICD-10-CM | POA: Diagnosis not present

## 2022-05-19 DIAGNOSIS — M545 Low back pain, unspecified: Secondary | ICD-10-CM

## 2022-05-19 NOTE — Therapy (Signed)
OUTPATIENT PHYSICAL THERAPY THORACOLUMBAR TREATMENT   Patient Name: [name] MRN: [id number] DOB:1950-06-20, 71 y.o., male Today's Date: 05/19/2022  END OF SESSION:  PT End of Session - 05/19/22 1043     Visit Number 2    Number of Visits 8    Date for PT Re-Evaluation 06/12/22    Authorization Type [employer]- [place]    Authorization Time Period no deductible; no auth required    PT Start Time [id number]    PT Stop Time 1115    PT Time Calculation (min) 35 min    Activity Tolerance Patient tolerated treatment well    Behavior During Therapy WFL for tasks assessed/performed             Past Medical History:  Diagnosis Date   (HFpEF) heart failure with preserved ejection fraction ([place]) 08/11/2021   Echo 08/2021: EF 50-55, no RWMA, GR 2 DD, mildly reduced RVSF, normal PASP, RVSP 29.8, mild LAE, trivial MR, trivial AI, mild dilation of ascending aorta (41 mm)   Allergy    Ascending aorta dilation ([place]) 08/11/2021   Echo 08/2021: EF 50-55, no RWMA, GR 2 DD, mildly reduced RVSF, normal PASP, RVSP 29.8, mild LAE, trivial MR, trivial AI, mild dilation of ascending aorta (41 mm)   CAD (coronary artery disease), native coronary artery    Multiple PCI procedures followed by CABG in 2009 with LIMA-LAD, SVG-D, seq SVG-OM and PLV, SVG-PDA. Echo (3/12): EF 60-65%, mild MR. // Nuc Stress Test 7/19:  EF 50, artifact, no ischemia, low risk    Carotid artery disease ([place]) 01/31/2017   dopplers 1/14:  0-39% bilat   Carotid stenosis    dopplers 1/14:  0-39% bilat   Cataract    REMOVED   Colon polyps 2006   Colonoscopy-Dr. Patterson(Tubular Adenoma)    Coronary Artery Disease 06/03/2008   Annotation: S/P CABG 2009 Qualifier: Diagnosis of  By: Olevia Perches, MD, Glenetta Hew    DM2 (diabetes mellitus, type 2) ([place])    Followed by Dr. Carlis Abbott   Essential hypertension 06/03/2008   Qualifier: Diagnosis of  By: Olevia Perches, MD, Glenetta Hew    History of echocardiogram    Echo 7/19: Mild LVH, EF  123456, normal diastolic function, trivial AI, moderate LAE   HLD (hyperlipidemia)    HTN (hypertension)    Hx of cardiovascular stress test    ETT-Myoview (03/2013):  Anteroseptal reversible defect, EF 54%, intermediate risk.   Hyperlipidemia 06/03/2008   Qualifier: Diagnosis of  By: Olevia Perches, MD, Glenetta Hew    Ischemic stroke ([place]) 01/30/2019   OSA (obstructive sleep apnea) 08/25/2011   PAD (peripheral artery disease) ([place]) 12/10/2017   Postoperative atrial fibrillation ([place]) 2009   Renal insufficiency 01/31/2019   Stroke [facility])    Type 2 diabetes mellitus with complication, with long-term current use of insulin ([place]) 06/03/2008   Qualifier: Diagnosis of  By: Olevia Perches, MD, Glenetta Hew    Ulcer    left foot   Varicose veins of lower extremities with other complications [id number]   Past Surgical History:  Procedure Laterality Date   APPENDECTOMY  2005   BACK SURGERY  2006   CARDIAC CATHETERIZATION  2009   EF 60%   COLONOSCOPY     CORONARY ARTERY BYPASS GRAFT  2009   post op. a fib, LIMA -LAD, SVG-D,seq SVG-OM and PLV,  SVG-PDA.   EYE SURGERY     LEFT HEART CATH AND CORS/GRAFTS ANGIOGRAPHY N/A 10/04/2019   Procedure: LEFT HEART CATH  AND CORS/GRAFTS ANGIOGRAPHY;  Surgeon: Sherren Mocha, MD;  Location: [place] [facility];  Service: Cardiovascular;  Laterality: N/A;   RIGHT/LEFT HEART CATH AND CORONARY/GRAFT ANGIOGRAPHY N/A 08/06/2021   Procedure: RIGHT/LEFT HEART CATH AND CORONARY/GRAFT ANGIOGRAPHY;  Surgeon: Sherren Mocha, MD;  Location: [place] [facility];  Service: Cardiovascular;  Laterality: N/A;   SPINE SURGERY     TONSILLECTOMY     Patient Active Problem List   Diagnosis Date Noted   (HFpEF) heart failure with preserved ejection fraction ([place]) 08/11/2021   Ascending aorta dilation ([place]) 08/11/2021   Shortness of breath 07/28/2021   Pain due to onychomycosis of toenails of both feet 01/08/2021   Diabetic neuropathy ([place]) 01/08/2021   Type 2 diabetes mellitus with  vascular disease ([place]) 01/08/2021   CKD (chronic kidney disease) 01/31/2019   History of stroke 01/30/2019   PAD (peripheral artery disease) ([place]) 12/10/2017   Carotid artery disease ([place]) 01/31/2017   Venous insufficiency 12/13/2013   OSA (obstructive sleep apnea) 08/25/2011   Type 2 diabetes mellitus with complication, with long-term current use of insulin ([place]) 06/03/2008   Hyperlipidemia LDL goal <70 06/03/2008   Essential hypertension 06/03/2008   Coronary artery disease involving native coronary artery of native heart with angina pectoris ([place]) 06/03/2008    PCP: Celene Squibb, MD  REFERRING PROVIDER: Frederik Pear, MD  REFERRING DIAG: [phone] (ICD-10-CM) - Lumbar spinal stenosis  Rationale for Evaluation and Treatment: Rehabilitation  THERAPY DIAG:  Spinal stenosis of lumbar region, unspecified whether neurogenic claudication present  Low back pain, unspecified back pain laterality, unspecified chronicity, unspecified whether sciatica present  ONSET DATE: about a year and half ago  SUBJECTIVE:                                                                                                                                                                                           SUBJECTIVE STATEMENT: Back is a little sore today. A little sore from exercises but ok otherwise. Most pain and issues are when he tries to walk (about 50 yards)    Eval: Pain in the low back sometimes but always have pain in my legs that increases with standing and walking.  Can only walk about 15 min or so and then burning starts down back of both legs. 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  Gradually worsening.  Prolonged sitting can also cause pain although he can sit longer than he can stand/walk  PERTINENT HISTORY:  Discectomy 2007   PAIN:  Are you having pain? Yes: NPRS scale: 0/10 Pain location: mostly low back,  buttocks and back of legs and down to  back of the knee Pain description: burning Aggravating factors: standing, walking Relieving factors: rest, changing positions  PRECAUTIONS: None  WEIGHT BEARING RESTRICTIONS: No  FALLS:  Has patient fallen in last 6 months? No  OCCUPATION: retired  PLOF: Independent  PATIENT GOALS: be able to travel and get around better  NEXT MD VISIT: 06/11/2022  OBJECTIVE:   DIAGNOSTIC FINDINGS:  MRI done; will bring next visit  PATIENT SURVEYS:  FOTO 47   COGNITION: Overall cognitive status: Within functional limits for tasks assessed     SENSATION: Neuropathy in feet and hands   POSTURE: rounded shoulders and forward head  PALPATION:   LUMBAR ROM:   AROM eval  Flexion 80% available  Extension 25%* available  Right lateral flexion   Left lateral flexion   Right rotation   Left rotation    (Blank rows = not tested)    LOWER EXTREMITY MMT:    MMT Right eval Left eval  Hip flexion 4+ 4+  Hip extension    Hip abduction    Hip adduction    Hip internal rotation    Hip external rotation    Knee flexion    Knee extension 5 5  Ankle dorsiflexion 5 5  Ankle plantarflexion    Ankle inversion    Ankle eversion     (Blank rows = not tested)  FUNCTIONAL TESTS:  5 times sit to stand: 13.72 sec 2 minute walk test: 349 ft  GAIT: Distance walked: 349 ft Assistive device utilized: None Level of assistance: Modified independence Comments: slower than normal gait speed; increased pain to 5/10  TODAY'S TREATMENT:                                                                                                                              DATE:  05/19/22 SKTC 10 x 5" each LTR 10 x 5" Ab brace 10 x 5" Ab march x 20 Bridge 10 x 5 "   05/15/2022 physical therapy evaluation and HEP instruction   PATIENT EDUCATION:  Education details: Patient educated on exam findings, POC, scope of PT, HEP. Person educated: Patient Education method: Explanation, Demonstration, and  Handouts Education comprehension: verbalized understanding, returned demonstration, verbal cues required, and tactile cues required   HOME EXERCISE PROGRAM: Access Code: [email or web address] URL: [email or web address]  05/19/22 - Supine Lower Trunk Rotation  - 2 x daily - 7 x weekly - 2 sets - 10 reps - 5 second hold - Supine Transversus Abdominis Bracing - Hands on Stomach  - 2 x daily - 7 x weekly - 2 sets - 10 reps - Hooklying Single Knee to Chest Stretch  - 2 x daily - 7 x weekly - 2 sets - 10 reps - 5 second hold - Supine Bridge  - 2 x daily - 7 x weekly - 2 sets - 10 reps - 5 second hold - Supine March  - 2  x daily - 7 x weekly - 2 sets - 10 reps  Date: 05/15/2022 Prepared by: AP - Rehab  Exercises - Supine Lower Trunk Rotation  - 2 x daily - 7 x weekly - 3 sets - 10 reps - Supine Transversus Abdominis Bracing - Hands on Stomach  - 2 x daily - 7 x weekly - 3 sets - 10 reps - Hooklying Single Knee to Chest Stretch  - 2 x daily - 7 x weekly - 3 sets - 10 reps  ASSESSMENT:  CLINICAL IMPRESSION: Patient tolerated session well today. Initiated there ex. Progressed core strengthening activity. Patient educated on purpose and function of all added exercises. Patient cued on hold times during stretches. Issued updated HEP handout. Patient will continue to benefit from skilled therapy services to reduce remaining deficits and improve functional ability.    OBJECTIVE IMPAIRMENTS: Abnormal gait, decreased activity tolerance, decreased endurance, decreased mobility, difficulty walking, decreased ROM, decreased strength, hypomobility, increased fascial restrictions, impaired perceived functional ability, impaired flexibility, and pain.   ACTIVITY LIMITATIONS: carrying, lifting, bending, sitting, standing, squatting, sleeping, stairs, transfers, locomotion level, and caring for others  PARTICIPATION LIMITATIONS: meal prep, cleaning, laundry, community activity, and yard work  [employer]  POTENTIAL: Good  CLINICAL DECISION MAKING: Stable/uncomplicated  EVALUATION COMPLEXITY: Low   GOALS: Goals reviewed with patient? No  SHORT TERM GOALS: Target date: 05/29/2022  patient will be independent with initial HEP   Baseline: Goal status: INITIAL  2.  Patient will self report 30% improvement to improve tolerance for functional activity   Baseline:  Goal status: INITIAL  LONG TERM GOALS: Target date: 06/22/2022  Patient will be independent in self management strategies to improve quality of life and functional outcomes. Baseline:  Goal status: INITIAL  2.  Patient will self report 50% improvement to improve tolerance for functional activity   Baseline:  Goal status: INITIAL  3.  Patient will improve FOTO score to predicted value   Baseline: 47 Goal status: INITIAL  4.  Patient will ambulate x 15 min without leg pain to so light shopping without issue Baseline:  Goal status: INITIAL  5.  Patient will increase distance on 2 MWT to 400 ft to demonstrate improved functional mobility with community ambulation Baseline: 349 ft Goal status: INITIAL  PLAN:  PT FREQUENCY: 2x/week  PT DURATION: 4 weeks  PLANNED INTERVENTIONS: Therapeutic exercises, Therapeutic activity, Neuromuscular re-education, Balance training, Gait training, Patient/Family education, Joint manipulation, Joint mobilization, Stair training, Orthotic/Fit training, DME instructions, Aquatic Therapy, Dry Needling, Electrical stimulation, Spinal manipulation, Spinal mobilization, Cryotherapy, Moist heat, Compression bandaging, scar mobilization, Splintting, Taping, Traction, Ultrasound, Ionotophoresis 82m/ml Dexamethasone, and Manual therapy .  PLAN FOR NEXT SESSION: progress core stabilization as able  11:17 AM, 05/19/22 CJosue HectorPT DPT  Physical [facility]rapist [facility] [phone]

## 2022-05-25 ENCOUNTER — Ambulatory Visit (HOSPITAL_COMMUNITY): Payer: No Typology Code available for payment source

## 2022-05-25 DIAGNOSIS — M48061 Spinal stenosis, lumbar region without neurogenic claudication: Secondary | ICD-10-CM | POA: Diagnosis not present

## 2022-05-25 DIAGNOSIS — M545 Low back pain, unspecified: Secondary | ICD-10-CM

## 2022-05-25 NOTE — Therapy (Signed)
OUTPATIENT PHYSICAL THERAPY THORACOLUMBAR TREATMENT   Patient Name: [name] MRN: [id number] DOB:May 28, 1950, 72 y.o., male Today's Date: 05/25/2022  END OF SESSION:  PT End of Session - 05/25/22 1350     Visit Number 3    Number of Visits 8    Date for PT Re-Evaluation 06/12/22    Authorization Type [employer] [place]    Authorization Time Period no deductible; no auth required    PT Start Time 1350    PT Stop Time 1430    PT Time Calculation (min) 40 min    Activity Tolerance Patient tolerated treatment well    Behavior During Therapy WFL for tasks assessed/performed             Past Medical History:  Diagnosis Date   (HFpEF) heart failure with preserved ejection fraction ([place]) 08/11/2021   Echo 08/2021: EF 50-55, no RWMA, GR 2 DD, mildly reduced RVSF, normal PASP, RVSP 29.8, mild LAE, trivial MR, trivial AI, mild dilation of ascending aorta (41 mm)   Allergy    Ascending aorta dilation ([place]) 08/11/2021   Echo 08/2021: EF 50-55, no RWMA, GR 2 DD, mildly reduced RVSF, normal PASP, RVSP 29.8, mild LAE, trivial MR, trivial AI, mild dilation of ascending aorta (41 mm)   CAD (coronary artery disease), native coronary artery    Multiple PCI procedures followed by CABG in 2009 with LIMA-LAD, SVG-D, seq SVG-OM and PLV, SVG-PDA. Echo (3/12): EF 60-65%, mild MR. // Nuc Stress Test 7/19:  EF 50, artifact, no ischemia, low risk    Carotid artery disease ([place]) 01/31/2017   dopplers 1/14:  0-39% bilat   Carotid stenosis    dopplers 1/14:  0-39% bilat   Cataract    REMOVED   Colon polyps 2006   Colonoscopy-Dr. Patterson(Tubular Adenoma)    Coronary Artery Disease 06/03/2008   Annotation: S/P CABG 2009 Qualifier: Diagnosis of  By: Olevia Perches, MD, Glenetta Hew    DM2 (diabetes mellitus, type 2) ([place])    Followed by Dr. Carlis Abbott   Essential hypertension 06/03/2008   Qualifier: Diagnosis of  By: Olevia Perches, MD, Glenetta Hew    History of echocardiogram    Echo 7/19: Mild LVH, EF  [id number], normal diastolic function, trivial AI, moderate LAE   HLD (hyperlipidemia)    HTN (hypertension)    Hx of cardiovascular stress test    ETT-Myoview (03/2013):  Anteroseptal reversible defect, EF 54%, intermediate risk.   Hyperlipidemia 06/03/2008   Qualifier: Diagnosis of  By: Olevia Perches, MD, Glenetta Hew    Ischemic stroke ([place]) 01/30/2019   OSA (obstructive sleep apnea) 08/25/2011   PAD (peripheral artery disease) ([place]) 12/10/2017   Postoperative atrial fibrillation ([place]) 2009   Renal insufficiency 01/31/2019   Stroke [facility])    Type 2 diabetes mellitus with complication, with long-term current use of insulin ([place]) 06/03/2008   Qualifier: Diagnosis of  By: Olevia Perches, MD, Glenetta Hew    Ulcer    left foot   Varicose veins of lower extremities with other complications [id number]   Past Surgical History:  Procedure Laterality Date   APPENDECTOMY  2005   BACK SURGERY  2006   CARDIAC CATHETERIZATION  2009   EF 60%   COLONOSCOPY     CORONARY ARTERY BYPASS GRAFT  2009   post op. a fib, LIMA -LAD, SVG-D,seq SVG-OM and PLV,  SVG-PDA.   EYE SURGERY     LEFT HEART CATH AND CORS/GRAFTS ANGIOGRAPHY N/A 10/04/2019   Procedure: LEFT HEART CATH  AND CORS/GRAFTS ANGIOGRAPHY;  Surgeon: Sherren Mocha, MD;  Location: [facility];  Service: Cardiovascular;  Laterality: N/A;   RIGHT/LEFT HEART CATH AND CORONARY/GRAFT ANGIOGRAPHY N/A 08/06/2021   Procedure: RIGHT/LEFT HEART CATH AND CORONARY/GRAFT ANGIOGRAPHY;  Surgeon: Sherren Mocha, MD;  Location: [facility];  Service: Cardiovascular;  Laterality: N/A;   SPINE SURGERY     TONSILLECTOMY     Patient Active Problem List   Diagnosis Date Noted   (HFpEF) heart failure with preserved ejection fraction ([place]) 08/11/2021   Ascending aorta dilation ([place]) 08/11/2021   Shortness of breath 07/28/2021   Pain due to onychomycosis of toenails of both feet 01/08/2021   Diabetic neuropathy ([place]) 01/08/2021   Type 2 diabetes mellitus with  vascular disease ([place]) 01/08/2021   CKD (chronic kidney disease) 01/31/2019   History of stroke 01/30/2019   PAD (peripheral artery disease) ([place]) 12/10/2017   Carotid artery disease ([place]) 01/31/2017   Venous insufficiency 12/13/2013   OSA (obstructive sleep apnea) 08/25/2011   Type 2 diabetes mellitus with complication, with long-term current use of insulin ([place]) 06/03/2008   Hyperlipidemia LDL goal <70 06/03/2008   Essential hypertension 06/03/2008   Coronary artery disease involving native coronary artery of native heart with angina pectoris ([place]) 06/03/2008    PCP: Celene Squibb, MD  REFERRING PROVIDER: Frederik Pear, MD  REFERRING DIAG: [id number] (ICD-10-CM) - Lumbar spinal stenosis  Rationale for Evaluation and Treatment: Rehabilitation  THERAPY DIAG:  Spinal stenosis of lumbar region, unspecified whether neurogenic claudication present  Low back pain, unspecified back pain laterality, unspecified chronicity, unspecified whether sciatica present  ONSET DATE: about a year and half ago  SUBJECTIVE:                                                                                                                                                                                           SUBJECTIVE STATEMENT: Feels that exercise is helping overall; no pain but has some muscle soreness today.   Eval: Pain in the low back sometimes but always have pain in my legs that increases with standing and walking.  Can only walk about 15 min or so and then burning starts down back of both legs. 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  Gradually worsening.  Prolonged sitting can also cause pain although he can sit longer than he can stand/walk  PERTINENT HISTORY:  Discectomy 2007   PAIN:  Are you having pain? Yes: NPRS scale: 0/10 Pain location: mostly low back, buttocks and back of legs and down to back of the knee Pain description:  burning Aggravating  factors: standing, walking Relieving factors: rest, changing positions  PRECAUTIONS: None  WEIGHT BEARING RESTRICTIONS: No  FALLS:  Has patient fallen in last 6 months? No  OCCUPATION: retired  PLOF: Independent  PATIENT GOALS: be able to travel and get around better  NEXT MD VISIT: 06/11/2022  OBJECTIVE:   DIAGNOSTIC FINDINGS:  MRI done; will bring next visit  PATIENT SURVEYS:  FOTO 47   COGNITION: Overall cognitive status: Within functional limits for tasks assessed     SENSATION: Neuropathy in feet and hands   POSTURE: rounded shoulders and forward head  PALPATION:   LUMBAR ROM:   AROM eval  Flexion 80% available  Extension 25%* available  Right lateral flexion   Left lateral flexion   Right rotation   Left rotation    (Blank rows = not tested)    LOWER EXTREMITY MMT:    MMT Right eval Left eval  Hip flexion 4+ 4+  Hip extension    Hip abduction    Hip adduction    Hip internal rotation    Hip external rotation    Knee flexion    Knee extension 5 5  Ankle dorsiflexion 5 5  Ankle plantarflexion    Ankle inversion    Ankle eversion     (Blank rows = not tested)  FUNCTIONAL TESTS:  5 times sit to stand: 13.72 sec 2 minute walk test: 349 ft  GAIT: Distance walked: 349 ft Assistive device utilized: None Level of assistance: Modified independence Comments: slower than normal gait speed; increased pain to 5/10  TODAY'S TREATMENT:                                                                                                                              DATE:  05/25/22 LTR x 10 x 5" SKTC 10 x 5" each Abdominal bracing 5" x 10 Abdominal bracing with march x 10 Bridge x 10 Bridge with hip adduction x 10 SLR x 10 each Hooklying hip abduction RTB 2 x 10 DKTC with physioball x 10 Dead bug 2 x 10  Sitting: Scapular retractions 2 x 10        05/19/22 SKTC 10 x 5" each LTR 10 x 5" Ab brace 10 x 5" Ab march x 20 Bridge 10 x 5  "   05/15/2022 physical therapy evaluation and HEP instruction   PATIENT EDUCATION:  Education details: Patient educated on exam findings, POC, scope of PT, HEP. Person educated: Patient Education method: Explanation, Demonstration, and Handouts Education comprehension: verbalized understanding, returned demonstration, verbal cues required, and tactile cues required   HOME EXERCISE PROGRAM: 05/25/22 Bridge with hip adduction, hip abduction with RTB , SLR  Access Code: [email or web address] URL: [email or web address]  05/19/22 - Supine Lower Trunk Rotation  - 2 x daily - 7 x weekly - 2 sets - 10 reps - 5 second hold - Supine Transversus Abdominis Bracing - Hands on Stomach  - 2 x daily -  7 x weekly - 2 sets - 10 reps - Hooklying Single Knee to Chest Stretch  - 2 x daily - 7 x weekly - 2 sets - 10 reps - 5 second hold - Supine Bridge  - 2 x daily - 7 x weekly - 2 sets - 10 reps - 5 second hold - Supine March  - 2 x daily - 7 x weekly - 2 sets - 10 reps  Date: 05/15/2022 Prepared by: [facility] - Rehab  Exercises - Supine Lower Trunk Rotation  - 2 x daily - 7 x weekly - 3 sets - 10 reps - Supine Transversus Abdominis Bracing - Hands on Stomach  - 2 x daily - 7 x weekly - 3 sets - 10 reps - Hooklying Single Knee to Chest Stretch  - 2 x daily - 7 x weekly - 3 sets - 10 reps  ASSESSMENT:  CLINICAL IMPRESSION: Today's session continued with focus on core strengthening and spinal mobility.  Progressed core strengthening today and updated HEP.  Patient educated on reasoning for all new exercise today with verbalized understanding. Some trouble coordinating dead bug but improved on second set.  Needs cues throughout treatment for proper breathing. Patient will continue to benefit from skilled therapy services to reduce remaining deficits and improve functional ability.    OBJECTIVE IMPAIRMENTS: Abnormal gait, decreased activity tolerance, decreased endurance, decreased mobility, difficulty  walking, decreased ROM, decreased strength, hypomobility, increased fascial restrictions, impaired perceived functional ability, impaired flexibility, and pain.   ACTIVITY LIMITATIONS: carrying, lifting, bending, sitting, standing, squatting, sleeping, stairs, transfers, locomotion level, and caring for others  PARTICIPATION LIMITATIONS: meal prep, cleaning, laundry, community activity, and yard work  [employer] POTENTIAL: Good  CLINICAL DECISION MAKING: Stable/uncomplicated  EVALUATION COMPLEXITY: Low   GOALS: Goals reviewed with patient? No  SHORT TERM GOALS: Target date: 05/29/2022  patient will be independent with initial HEP   Baseline: Goal status: IN PROGRESS  2.  Patient will self report 30% improvement to improve tolerance for functional activity   Baseline:  Goal status: IN PROGRESS  LONG TERM GOALS: Target date: 06/22/2022  Patient will be independent in self management strategies to improve quality of life and functional outcomes. Baseline:  Goal status: IN PROGRESS  2.  Patient will self report 50% improvement to improve tolerance for functional activity   Baseline:  Goal status: IN PROGRESS  3.  Patient will improve FOTO score to predicted value   Baseline: 47 Goal status: IN PROGRESS  4.  Patient will ambulate x 15 min without leg pain to so light shopping without issue Baseline:  Goal status: IN PROGRESS  5.  Patient will increase distance on 2 MWT to 400 ft to demonstrate improved functional mobility with community ambulation Baseline: 349 ft Goal status: IN PROGRESS  PLAN:  PT FREQUENCY: 2x/week  PT DURATION: 4 weeks  PLANNED INTERVENTIONS: Therapeutic exercises, Therapeutic activity, Neuromuscular re-education, Balance training, Gait training, Patient/Family education, Joint manipulation, Joint mobilization, Stair training, Orthotic/Fit training, DME instructions, Aquatic Therapy, Dry Needling, Electrical stimulation, Spinal manipulation,  Spinal mobilization, Cryotherapy, Moist heat, Compression bandaging, scar mobilization, Splintting, Taping, Traction, Ultrasound, Ionotophoresis 58m/ml Dexamethasone, and Manual therapy .  PLAN FOR NEXT SESSION: progress core stabilization as able; progress to standing exercise as able  2:29 PM, 05/25/22 Payton Prinsen Small Daleen Steinhaus MPT [facility] [place] #[phone]

## 2022-05-27 ENCOUNTER — Ambulatory Visit (HOSPITAL_COMMUNITY): Payer: No Typology Code available for payment source

## 2022-05-27 DIAGNOSIS — M545 Low back pain, unspecified: Secondary | ICD-10-CM

## 2022-05-27 DIAGNOSIS — M48061 Spinal stenosis, lumbar region without neurogenic claudication: Secondary | ICD-10-CM | POA: Diagnosis not present

## 2022-05-27 NOTE — Therapy (Signed)
OUTPATIENT PHYSICAL THERAPY THORACOLUMBAR TREATMENT   Patient Name: [name] MRN: [id number] DOB:March 20, 1951, 72 y.o., male Today's Date: 05/27/2022  END OF SESSION:  PT End of Session - 05/27/22 1543     Visit Number 4    Number of Visits 8    Date for PT Re-Evaluation 06/12/22    Authorization Type [employer] [place]    Authorization Time Period no deductible; no auth required    PT Start Time 1529    PT Stop Time 1600    PT Time Calculation (min) 31 min    Activity Tolerance Patient tolerated treatment well            Past Medical History:  Diagnosis Date   (HFpEF) heart failure with preserved ejection fraction ([place]) 08/11/2021   Echo 08/2021: EF 50-55, no RWMA, GR 2 DD, mildly reduced RVSF, normal PASP, RVSP 29.8, mild LAE, trivial MR, trivial AI, mild dilation of ascending aorta (41 mm)   Allergy    Ascending aorta dilation ([place]) 08/11/2021   Echo 08/2021: EF 50-55, no RWMA, GR 2 DD, mildly reduced RVSF, normal PASP, RVSP 29.8, mild LAE, trivial MR, trivial AI, mild dilation of ascending aorta (41 mm)   CAD (coronary artery disease), native coronary artery    Multiple PCI procedures followed by CABG in 2009 with LIMA-LAD, SVG-D, seq SVG-OM and PLV, SVG-PDA. Echo (3/12): EF 60-65%, mild MR. // Nuc Stress Test 7/19:  EF 50, artifact, no ischemia, low risk    Carotid artery disease ([place]) 01/31/2017   dopplers 1/14:  0-39% bilat   Carotid stenosis    dopplers 1/14:  0-39% bilat   Cataract    REMOVED   Colon polyps 2006   Colonoscopy-Dr. Patterson(Tubular Adenoma)    Coronary Artery Disease 06/03/2008   Annotation: S/P CABG 2009 Qualifier: Diagnosis of  By: Olevia Perches, MD, Glenetta Hew    DM2 (diabetes mellitus, type 2) ([place])    Followed by Dr. Carlis Abbott   Essential hypertension 06/03/2008   Qualifier: Diagnosis of  By: Olevia Perches, MD, Glenetta Hew    History of echocardiogram    Echo 7/19: Mild LVH, EF 123456, normal diastolic function, trivial AI, moderate LAE   HLD  (hyperlipidemia)    HTN (hypertension)    Hx of cardiovascular stress test    ETT-Myoview (03/2013):  Anteroseptal reversible defect, EF 54%, intermediate risk.   Hyperlipidemia 06/03/2008   Qualifier: Diagnosis of  By: Olevia Perches, MD, [place]etta Hew    Ischemic stroke ([place]) 01/30/2019   OSA (obstructive sleep apnea) 08/25/2011   PAD (peripheral artery disease) ([place]) 12/10/2017   Postoperative atrial fibrillation ([place]) 2009   Renal insufficiency 01/31/2019   Stroke [facility])    Type 2 diabetes mellitus with complication, with long-term current use of insulin ([place]) 06/03/2008   Qualifier: Diagnosis of  By: Olevia Perches, MD, Glenetta Hew    Ulcer    left foot   Varicose veins of lower extremities with other complications [id number]   Past Surgical History:  Procedure Laterality Date   APPENDECTOMY  2005   BACK SURGERY  2006   CARDIAC CATHETERIZATION  2009   EF 60%   COLONOSCOPY     CORONARY ARTERY BYPASS GRAFT  2009   post op. a fib, LIMA -LAD, SVG-D,seq SVG-OM and PLV,  SVG-PDA.   EYE SURGERY     LEFT HEART CATH AND CORS/GRAFTS ANGIOGRAPHY N/A 10/04/2019   Procedure: LEFT HEART CATH AND CORS/GRAFTS ANGIOGRAPHY;  Surgeon: Sherren Mocha, MD;  Location: [facility]  INVASIVE CV LAB;  Service: Cardiovascular;  Laterality: N/A;   RIGHT/LEFT HEART CATH AND CORONARY/GRAFT ANGIOGRAPHY N/A 08/06/2021   Procedure: RIGHT/LEFT HEART CATH AND CORONARY/GRAFT ANGIOGRAPHY;  Surgeon: Sherren Mocha, MD;  Location: [place] CV LAB;  Service: Cardiovascular;  Laterality: N/A;   SPINE SURGERY     TONSILLECTOMY     Patient Active Problem List   Diagnosis Date Noted   (HFpEF) heart failure with preserved ejection fraction ([place]) 08/11/2021   Ascending aorta dilation ([place]) 08/11/2021   Shortness of breath 07/28/2021   Pain due to onychomycosis of toenails of both feet 01/08/2021   Diabetic neuropathy ([place]) 01/08/2021   Type 2 diabetes mellitus with vascular disease ([place]) 01/08/2021   CKD (chronic kidney disease)  01/31/2019   History of stroke 01/30/2019   PAD (peripheral artery disease) ([place]) 12/10/2017   Carotid artery disease ([place]) 01/31/2017   Venous insufficiency 12/13/2013   OSA (obstructive sleep apnea) 08/25/2011   Type 2 diabetes mellitus with complication, with long-term current use of insulin ([place]) 06/03/2008   Hyperlipidemia LDL goal <70 06/03/2008   Essential hypertension 06/03/2008   Coronary artery disease involving native coronary artery of native heart with angina pectoris ([place]) 06/03/2008    PCP: Celene Squibb, MD  REFERRING PROVIDER: Frederik Pear, MD  REFERRING DIAG: [phone] (ICD-10-CM) - Lumbar spinal stenosis  Rationale for Evaluation and Treatment: Rehabilitation  THERAPY DIAG:  Spinal stenosis of lumbar region, unspecified whether neurogenic claudication present  Low back pain, unspecified back pain laterality, unspecified chronicity, unspecified whether sciatica present  ONSET DATE: about a year and half ago  SUBJECTIVE:                                                                                                                                                                                           SUBJECTIVE STATEMENT: Patient is 14 minutes late today. Denies having any pain at the moment. However, patient states that he's still sore. Patient reports that he's hurting when he walks for about 50 yards and the pain will shoot at the back of B thighs (graded 8/10 on NPRS).   Eval: Pain in the low back sometimes but always have pain in my legs that increases with standing and walking.  Can only walk about 15 min or so and then burning starts down back of both legs. 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  Gradually worsening.  Prolonged sitting can also cause pain although he can sit longer than he can stand/walk  PERTINENT HISTORY:  Discectomy 2007   PAIN:  Are you having pain? Yes:  NPRS scale: 0/10 Pain location:  mostly low back, buttocks and back of legs and down to back of the knee Pain description: burning Aggravating factors: standing, walking Relieving factors: rest, changing positions  PRECAUTIONS: None  WEIGHT BEARING RESTRICTIONS: No  FALLS:  Has patient fallen in last 6 months? No  OCCUPATION: retired  PLOF: Independent  PATIENT GOALS: be able to travel and get around better  NEXT MD VISIT: 06/11/2022  OBJECTIVE:   DIAGNOSTIC FINDINGS:  MRI done; will bring next visit  PATIENT SURVEYS:  FOTO 47   COGNITION: Overall cognitive status: Within functional limits for tasks assessed     SENSATION: Neuropathy in feet and hands   POSTURE: rounded shoulders and forward head  PALPATION:   LUMBAR ROM:   AROM eval  Flexion 80% available  Extension 25%* available  Right lateral flexion   Left lateral flexion   Right rotation   Left rotation    (Blank rows = not tested)    LOWER EXTREMITY MMT:    MMT Right eval Left eval  Hip flexion 4+ 4+  Hip extension    Hip abduction    Hip adduction    Hip internal rotation    Hip external rotation    Knee flexion    Knee extension 5 5  Ankle dorsiflexion 5 5  Ankle plantarflexion    Ankle inversion    Ankle eversion     (Blank rows = not tested)  FUNCTIONAL TESTS:  5 times sit to stand: 13.72 sec 2 minute walk test: 349 ft  GAIT: Distance walked: 349 ft Assistive device utilized: None Level of assistance: Modified independence Comments: slower than normal gait speed; increased pain to 5/10  TODAY'S TREATMENT:                                                                                                                              DATE:  05/27/22 Seated hamstring stretch x 30" x 3 SKTC 10 x 15" each LTR x 10 x 5" Standing hip abd x 10 x 2  05/25/22 LTR x 10 x 5" SKTC 10 x 5" each Abdominal bracing 5" x 10 Abdominal bracing with march x 10 Bridge x 10 Bridge with hip adduction x 10 SLR x 10  each Hooklying hip abduction RTB 2 x 10 DKTC with physioball x 10 Dead bug 2 x 10  Sitting: Scapular retractions 2 x 10  05/19/22 SKTC 10 x 5" each LTR 10 x 5" Ab brace 10 x 5" Ab march x 20 Bridge 10 x 5 "   05/15/2022 physical therapy evaluation and HEP instruction   PATIENT EDUCATION:  Education details: updated HEP Person educated: Patient Education method: Consulting civil engineer, Demonstration, and Handouts Education comprehension: verbalized understanding, returned demonstration, verbal cues required, and tactile cues required   HOME EXERCISE PROGRAM: 05/25/22 Bridge with hip adduction, hip abduction with RTB , SLR  Access Code: [email or web address] URL: [email or web address] 05/27/22 - Seated Hamstring Stretch  -  2 x daily - 7 x weekly - 3 reps - 30 [name] - Hooklying Single Knee to Chest Stretch  - 2 x daily - 7 x weekly - 5 reps - 15 [name]  05/19/22 - Supine Lower Trunk Rotation  - 2 x daily - 7 x weekly - 2 sets - 10 reps - 5 second [name] - Supine Transversus Abdominis Bracing - Hands on Stomach  - 2 x daily - 7 x weekly - 2 sets - 10 reps - Hooklying Single Knee to Chest Stretch  - 2 x daily - 7 x weekly - 2 sets - 10 reps - 5 second [name] - Supine Bridge  - 2 x daily - 7 x weekly - 2 sets - 10 reps - 5 second [name] - Supine March  - 2 x daily - 7 x weekly - 2 sets - 10 reps  Date: 05/15/2022 Prepared by: [facility] - Rehab  Exercises - Supine Lower Trunk Rotation  - 2 x daily - 7 x weekly - 3 sets - 10 reps - Supine Transversus Abdominis Bracing - Hands on Stomach  - 2 x daily - 7 x weekly - 3 sets - 10 reps - Hooklying Single Knee to Chest Stretch  - 2 x daily - 7 x weekly - 3 sets - 10 reps  ASSESSMENT:  CLINICAL IMPRESSION: Interventions today were geared towards LE flexibility and core strength. Pain was localized on the hips during [place]. Demonstrated appropriate levels of fatigue. Pacing of activities was slow. Required moderate amount of visual cueing to ensure correct  execution of activity especially on the hamstring stretch. No worsening leg pain reported on standing hip abd. To date, skilled PT is required to address the impairments and improve function.  OBJECTIVE IMPAIRMENTS: Abnormal gait, decreased activity tolerance, decreased endurance, decreased mobility, difficulty walking, decreased ROM, decreased strength, hypomobility, increased fascial restrictions, impaired perceived functional ability, impaired flexibility, and pain.   ACTIVITY LIMITATIONS: carrying, lifting, bending, sitting, standing, squatting, sleeping, stairs, transfers, locomotion level, and caring for others  PARTICIPATION LIMITATIONS: meal prep, cleaning, laundry, community activity, and yard work  [employer] POTENTIAL: Good  CLINICAL DECISION MAKING: Stable/uncomplicated  EVALUATION COMPLEXITY: Low   GOALS: Goals reviewed with patient? No  SHORT TERM GOALS: Target date: 05/29/2022  patient will be independent with initial HEP   Baseline: Goal status: IN PROGRESS  2.  Patient will self report 30% improvement to improve tolerance for functional activity   Baseline:  Goal status: IN PROGRESS  LONG TERM GOALS: Target date: 06/22/2022  Patient will be independent in self management strategies to improve quality of life and functional outcomes. Baseline:  Goal status: IN PROGRESS  2.  Patient will self report 50% improvement to improve tolerance for functional activity   Baseline:  Goal status: IN PROGRESS  3.  Patient will improve FOTO score to predicted value   Baseline: 47 Goal status: IN PROGRESS  4.  Patient will ambulate x 15 min without leg pain to so light shopping without issue Baseline:  Goal status: IN PROGRESS  5.  Patient will increase distance on 2 MWT to 400 ft to demonstrate improved functional mobility with community ambulation Baseline: 349 ft Goal status: IN PROGRESS  PLAN:  PT FREQUENCY: 2x/week  PT DURATION: 4 weeks  PLANNED  INTERVENTIONS: Therapeutic exercises, Therapeutic activity, Neuromuscular re-education, Balance training, Gait training, Patient/Family education, Joint manipulation, Joint mobilization, Stair training, Orthotic/Fit training, DME instructions, Aquatic Therapy, Dry Needling, Electrical stimulation, Spinal manipulation, Spinal mobilization, Cryotherapy, Moist  heat, Compression bandaging, scar mobilization, Splintting, Taping, Traction, Ultrasound, Ionotophoresis 71m/ml Dexamethasone, and Manual therapy .  PLAN FOR NEXT SESSION: Continue POC and may progress as tolerated with emphasis on core stabilization in standing  Jeovany Huitron L. Casmere Hollenbeck, PT, DPT, OCS Board-Certified Clinical Specialist in [place]# ([place]: [id number] 3:59 PM, 05/27/22

## 2022-06-02 ENCOUNTER — Encounter (HOSPITAL_COMMUNITY): Payer: No Typology Code available for payment source

## 2022-06-02 ENCOUNTER — Ambulatory Visit (HOSPITAL_COMMUNITY): Payer: No Typology Code available for payment source

## 2022-06-02 DIAGNOSIS — M48061 Spinal stenosis, lumbar region without neurogenic claudication: Secondary | ICD-10-CM

## 2022-06-02 DIAGNOSIS — M545 Low back pain, unspecified: Secondary | ICD-10-CM

## 2022-06-02 NOTE — Therapy (Signed)
OUTPATIENT PHYSICAL THERAPY THORACOLUMBAR TREATMENT   Patient Name: [name] MRN: [id number] DOB:13-Jan-1951, 72 y.o., male Today's Date: 06/02/2022  END OF SESSION:  PT End of Session - 06/02/22 1441     Visit Number 5    Number of Visits 8    Date for PT Re-Evaluation 06/12/22    Authorization Type [employer] [place]    Authorization Time Period no deductible; no auth required    PT Start Time 1440    PT Stop Time 1515    PT Time Calculation (min) 35 min    Activity Tolerance Patient tolerated treatment well            Past Medical History:  Diagnosis Date   (HFpEF) heart failure with preserved ejection fraction ([place]) 08/11/2021   Echo 08/2021: EF 50-55, no RWMA, GR 2 DD, mildly reduced RVSF, normal PASP, RVSP 29.8, mild LAE, trivial MR, trivial AI, mild dilation of ascending aorta (41 mm)   Allergy    Ascending aorta dilation ([place]) 08/11/2021   Echo 08/2021: EF 50-55, no RWMA, GR 2 DD, mildly reduced RVSF, normal PASP, RVSP 29.8, mild LAE, trivial MR, trivial AI, mild dilation of ascending aorta (41 mm)   CAD (coronary artery disease), native coronary artery    Multiple PCI procedures followed by CABG in 2009 with LIMA-LAD, SVG-D, seq SVG-OM and PLV, SVG-PDA. Echo (3/12): EF 60-65%, mild MR. // Nuc Stress Test 7/19:  EF 50, artifact, no ischemia, low risk    Carotid artery disease ([place]) 01/31/2017   dopplers 1/14:  0-39% bilat   Carotid stenosis    dopplers 1/14:  0-39% bilat   Cataract    REMOVED   Colon polyps 2006   Colonoscopy-Dr. Patterson(Tubular Adenoma)    Coronary Artery Disease 06/03/2008   Annotation: S/P CABG 2009 Qualifier: Diagnosis of  By: Olevia Perches, MD, Glenetta Hew    DM2 (diabetes mellitus, type 2) ([place])    Followed by Dr. Carlis Abbott   Essential hypertension 06/03/2008   Qualifier: Diagnosis of  By: Olevia Perches, MD, Glenetta Hew    History of echocardiogram    Echo 7/19: Mild LVH, EF 123456, normal diastolic function, trivial AI, moderate LAE   HLD  (hyperlipidemia)    HTN (hypertension)    Hx of cardiovascular stress test    ETT-Myoview (03/2013):  Anteroseptal reversible defect, EF 54%, intermediate risk.   Hyperlipidemia 06/03/2008   Qualifier: Diagnosis of  By: Olevia Perches, MD, Glenetta Hew    Ischemic stroke ([place]) 01/30/2019   OSA (obstructive sleep apnea) 08/25/2011   PAD (peripheral artery disease) ([place]) 12/10/2017   Postoperative atrial fibrillation ([place]) 2009   Renal insufficiency 01/31/2019   Stroke [facility])    Type 2 diabetes mellitus with complication, with long-term current use of insulin ([place]) 06/03/2008   Qualifier: Diagnosis of  By: Olevia Perches, MD, Glenetta Hew    Ulcer    left foot   Varicose veins of lower extremities with other complications [id number]   Past Surgical History:  Procedure Laterality Date   APPENDECTOMY  2005   BACK SURGERY  2006   CARDIAC CATHETERIZATION  2009   EF 60%   COLONOSCOPY     CORONARY ARTERY BYPASS GRAFT  2009   post op. a fib, LIMA -LAD, SVG-D,seq SVG-OM and PLV,  SVG-PDA.   EYE SURGERY     LEFT HEART CATH AND CORS/GRAFTS ANGIOGRAPHY N/A 10/04/2019   Procedure: LEFT HEART CATH AND CORS/GRAFTS ANGIOGRAPHY;  Surgeon: Sherren Mocha, MD;  Location: [facility]  INVASIVE CV LAB;  Service: Cardiovascular;  Laterality: N/A;   RIGHT/LEFT HEART CATH AND CORONARY/GRAFT ANGIOGRAPHY N/A 08/06/2021   Procedure: RIGHT/LEFT HEART CATH AND CORONARY/GRAFT ANGIOGRAPHY;  Surgeon: Sherren Mocha, MD;  Location: [facility] CV LAB;  Service: Cardiovascular;  Laterality: N/A;   SPINE SURGERY     TONSILLECTOMY     Patient Active Problem List   Diagnosis Date Noted   (HFpEF) heart failure with preserved ejection fraction ([place]) 08/11/2021   Ascending aorta dilation ([place]) 08/11/2021   Shortness of breath 07/28/2021   Pain due to onychomycosis of toenails of both feet 01/08/2021   Diabetic neuropathy ([place]) 01/08/2021   Type 2 diabetes mellitus with vascular disease ([place]) 01/08/2021   CKD (chronic kidney disease)  01/31/2019   History of stroke 01/30/2019   PAD (peripheral artery disease) ([place]) 12/10/2017   Carotid artery disease ([place]) 01/31/2017   Venous insufficiency 12/13/2013   OSA (obstructive sleep apnea) 08/25/2011   Type 2 diabetes mellitus with complication, with long-term current use of insulin ([place]) 06/03/2008   Hyperlipidemia LDL goal <70 06/03/2008   Essential hypertension 06/03/2008   Coronary artery disease involving native coronary artery of native heart with angina pectoris ([place]) 06/03/2008    PCP: Celene Squibb, MD  REFERRING PROVIDER: Frederik Pear, MD  REFERRING DIAG: [id number] (ICD-10-CM) - Lumbar spinal stenosis  Rationale for Evaluation and Treatment: Rehabilitation  THERAPY DIAG:  Spinal stenosis of lumbar region, unspecified whether neurogenic claudication present  Low back pain, unspecified back pain laterality, unspecified chronicity, unspecified whether sciatica present  ONSET DATE: about a year and half ago  SUBJECTIVE:                                                                                                                                                                                           SUBJECTIVE STATEMENT:  Patient is 10 minutes late today due to issues with scheduling. Currently denies any pain.  Eval: Pain in the low back sometimes but always have pain in my legs that increases with standing and walking.  Can only walk about 15 min or so and then burning starts down back of both legs. 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  Gradually worsening.  Prolonged sitting can also cause pain although he can sit longer than he can stand/walk  PERTINENT HISTORY:  Discectomy 2007   PAIN:  Are you having pain? Yes: NPRS scale: 0/10 Pain location: mostly low back, buttocks and back of legs and down to back of the knee Pain description: burning Aggravating factors: standing, walking Relieving factors: rest,  changing  positions  PRECAUTIONS: None  WEIGHT BEARING RESTRICTIONS: No  FALLS:  Has patient fallen in last 6 months? No  OCCUPATION: retired  PLOF: Independent  PATIENT GOALS: be able to travel and get around better  NEXT MD VISIT: 06/11/2022  OBJECTIVE:   DIAGNOSTIC FINDINGS:  MRI done; will bring next visit  PATIENT SURVEYS:  FOTO 47   COGNITION: Overall cognitive status: Within functional limits for tasks assessed     SENSATION: Neuropathy in feet and hands   POSTURE: rounded shoulders and forward head  PALPATION:   LUMBAR ROM:   AROM eval  Flexion 80% available  Extension 25%* available  Right lateral flexion   Left lateral flexion   Right rotation   Left rotation    (Blank rows = not tested)    LOWER EXTREMITY MMT:    MMT Right eval Left eval  Hip flexion 4+ 4+  Hip extension    Hip abduction    Hip adduction    Hip internal rotation    Hip external rotation    Knee flexion    Knee extension 5 5  Ankle dorsiflexion 5 5  Ankle plantarflexion    Ankle inversion    Ankle eversion     (Blank rows = not tested)  FUNCTIONAL TESTS:  5 times sit to stand: 13.72 sec 2 minute walk test: 349 ft  GAIT: Distance walked: 349 ft Assistive device utilized: None Level of assistance: Modified independence Comments: slower than normal gait speed; increased pain to 5/10  TODAY'S TREATMENT:                                                                                                                              DATE:  06/02/22 Seated hamstring stretch x 30" x 3 Supine figure-of-4 piriformis stretch x 30" x 3 SKTC 10 x 15" each LTR x 10 x 5" Standing abdominal isometrics with physioball x 3" x 10 x 2 Standing hip abd x 10 x 2 Standing hip ext x 10 x 2  05/27/22 Seated hamstring stretch x 30" x 3 SKTC 10 x 15" each LTR x 10 x 5" Standing hip abd x 10 x 2  05/25/22 LTR x 10 x 5" SKTC 10 x 5" each Abdominal bracing 5" x 10 Abdominal  bracing with march x 10 Bridge x 10 Bridge with hip adduction x 10 SLR x 10 each Hooklying hip abduction RTB 2 x 10 DKTC with physioball x 10 Dead bug 2 x 10  Sitting: Scapular retractions 2 x 10  05/19/22 SKTC 10 x 5" each LTR 10 x 5" Ab brace 10 x 5" Ab march x 20 Bridge 10 x 5 "   05/15/2022 physical therapy evaluation and HEP instruction   PATIENT EDUCATION:  Education details: updated HEP Person educated: Patient Education method: Consulting civil engineer, Demonstration, and Handouts Education comprehension: verbalized understanding, returned demonstration, verbal cues required, and tactile cues required   HOME EXERCISE PROGRAM: 05/25/22 Bridge with  hip adduction, hip abduction with RTB , SLR  Access Code: [email or web address] URL: [email or web address] 06/02/22 - Supine Piriformis Stretch with Foot on Ground  - 2 x daily - 7 x weekly - 3 reps - 30 hold - Standing Hip Extension with Counter Support  - 2 x daily - 7 x weekly - 2 sets - 10 reps - Standing Hip Abduction with Counter Support  - 2 x daily - 7 x weekly - 2 sets - 10 reps  05/27/22 - Seated Hamstring Stretch  - 2 x daily - 7 x weekly - 3 reps - 30 hold - Hooklying Single Knee to Chest Stretch  - 2 x daily - 7 x weekly - 5 reps - 15 hold  05/19/22 - Supine Lower Trunk Rotation  - 2 x daily - 7 x weekly - 2 sets - 10 reps - 5 second hold - Supine Transversus Abdominis Bracing - Hands on Stomach  - 2 x daily - 7 x weekly - 2 sets - 10 reps - Hooklying Single Knee to Chest Stretch  - 2 x daily - 7 x weekly - 2 sets - 10 reps - 5 second hold - Supine Bridge  - 2 x daily - 7 x weekly - 2 sets - 10 reps - 5 second hold - Supine March  - 2 x daily - 7 x weekly - 2 sets - 10 reps  Date: 05/15/2022 Prepared by: [facility] - Rehab  Exercises - Supine Lower Trunk Rotation  - 2 x daily - 7 x weekly - 3 sets - 10 reps - Supine Transversus Abdominis Bracing - Hands on Stomach  - 2 x daily - 7 x weekly - 3 sets - 10 reps - Hooklying  Single Knee to Chest Stretch  - 2 x daily - 7 x weekly - 3 sets - 10 reps  ASSESSMENT:  CLINICAL IMPRESSION: Interventions today were geared towards LE flexibility and core strength. Demonstrated appropriate levels of fatigue. Pacing of activities was slow. Required mild amount of visual cueing to ensure correct execution of activity especially on the abdominal isometrics. No posterior leg pain reported on all exercises.  To date, skilled PT is required to address the impairments and improve function.  OBJECTIVE IMPAIRMENTS: Abnormal gait, decreased activity tolerance, decreased endurance, decreased mobility, difficulty walking, decreased ROM, decreased strength, hypomobility, increased fascial restrictions, impaired perceived functional ability, impaired flexibility, and pain.   ACTIVITY LIMITATIONS: carrying, lifting, bending, sitting, standing, squatting, sleeping, stairs, transfers, locomotion level, and caring for others  PARTICIPATION LIMITATIONS: meal prep, cleaning, laundry, community activity, and yard work  [employer] POTENTIAL: Good  CLINICAL DECISION MAKING: Stable/uncomplicated  EVALUATION COMPLEXITY: Low   GOALS: Goals reviewed with patient? No  SHORT TERM GOALS: Target date: 05/29/2022  patient will be independent with initial HEP   Baseline: Goal status: IN PROGRESS  2.  Patient will self report 30% improvement to improve tolerance for functional activity   Baseline:  Goal status: IN PROGRESS  LONG TERM GOALS: Target date: 06/22/2022  Patient will be independent in self management strategies to improve quality of life and functional outcomes. Baseline:  Goal status: IN PROGRESS  2.  Patient will self report 50% improvement to improve tolerance for functional activity   Baseline:  Goal status: IN PROGRESS  3.  Patient will improve FOTO score to predicted value   Baseline: 47 Goal status: IN PROGRESS  4.  Patient will ambulate x 15 min without leg pain  to so light shopping without issue  Baseline:  Goal status: IN PROGRESS  5.  Patient will increase distance on 2 MWT to 400 ft to demonstrate improved functional mobility with community ambulation Baseline: 349 ft Goal status: IN PROGRESS  PLAN:  PT FREQUENCY: 2x/week  PT DURATION: 4 weeks  PLANNED INTERVENTIONS: Therapeutic exercises, Therapeutic activity, Neuromuscular re-education, Balance training, Gait training, Patient/Family education, Joint manipulation, Joint mobilization, Stair training, Orthotic/Fit training, DME instructions, Aquatic Therapy, Dry Needling, Electrical stimulation, Spinal manipulation, Spinal mobilization, Cryotherapy, Moist heat, Compression bandaging, scar mobilization, Splintting, Taping, Traction, Ultrasound, Ionotophoresis '4mg'$ /ml Dexamethasone, and Manual therapy .  PLAN FOR NEXT SESSION: Continue POC and may progress as tolerated with emphasis on core stabilization in standing  Joella Saefong L. Yasuo Phimmasone, PT, DPT, OCS Board-Certified Clinical Specialist in [place] # ([place]): [phone] T 2:56 PM, 06/02/22

## 2022-06-03 ENCOUNTER — Encounter (HOSPITAL_COMMUNITY): Payer: No Typology Code available for payment source

## 2022-06-04 ENCOUNTER — Encounter (HOSPITAL_COMMUNITY): Payer: No Typology Code available for payment source

## 2022-06-04 DIAGNOSIS — G5603 Carpal tunnel syndrome, bilateral upper limbs: Secondary | ICD-10-CM | POA: Diagnosis not present

## 2022-06-04 DIAGNOSIS — M65331 Trigger finger, right middle finger: Secondary | ICD-10-CM | POA: Diagnosis not present

## 2022-06-10 ENCOUNTER — Encounter (HOSPITAL_COMMUNITY): Payer: No Typology Code available for payment source

## 2022-06-11 DIAGNOSIS — M1711 Unilateral primary osteoarthritis, right knee: Secondary | ICD-10-CM | POA: Diagnosis not present

## 2022-06-11 DIAGNOSIS — M545 Low back pain, unspecified: Secondary | ICD-10-CM | POA: Diagnosis not present

## 2022-06-12 ENCOUNTER — Ambulatory Visit (HOSPITAL_COMMUNITY): Payer: No Typology Code available for payment source | Attending: Internal Medicine

## 2022-06-12 DIAGNOSIS — M48061 Spinal stenosis, lumbar region without neurogenic claudication: Secondary | ICD-10-CM | POA: Diagnosis not present

## 2022-06-12 DIAGNOSIS — M545 Low back pain, unspecified: Secondary | ICD-10-CM | POA: Diagnosis not present

## 2022-06-12 NOTE — Therapy (Signed)
OUTPATIENT PHYSICAL THERAPY THORACOLUMBAR PROGRESS/DISCHARGE NOTE   Patient Name: [name] MRN: [id number] DOB:May 21, 1950, 73 y.o., male Today's Date: 06/12/2022  PHYSICAL THERAPY DISCHARGE SUMMARY  Visits from Start of Care: 6  Current functional level related to goals / functional outcomes: See Below   Remaining deficits: See Below   Education / Equipment: See Below   Patient agrees to discharge. Patient goals were met. Patient is being discharged due to meeting the stated rehab goals.   Progress Note Reporting Period 05/15/2022 to 06/12/2022  See note below for Objective Data and Assessment of Progress/Goals.     END OF SESSION:  PT End of Session - 06/12/22 1340     Visit Number 6    Number of Visits 8    Date for PT Re-Evaluation 06/12/22    Authorization Type [employer] [place]    Authorization Time Period no deductible; no auth required    PT Start Time 1305    PT Stop Time [id number]    PT Time Calculation (min) 40 min    Activity Tolerance Patient tolerated treatment well    Behavior During Therapy WFL for tasks assessed/performed            Past Medical History:  Diagnosis Date   (HFpEF) heart failure with preserved ejection fraction ([place]) 08/11/2021   Echo 08/2021: EF 50-55, no RWMA, GR 2 DD, mildly reduced RVSF, normal PASP, RVSP 29.8, mild LAE, trivial MR, trivial AI, mild dilation of ascending aorta (41 mm)   Allergy    Ascending aorta dilation ([place]) 08/11/2021   Echo 08/2021: EF 50-55, no RWMA, GR 2 DD, mildly reduced RVSF, normal PASP, RVSP 29.8, mild LAE, trivial MR, trivial AI, mild dilation of ascending aorta (41 mm)   CAD (coronary artery disease), native coronary artery    Multiple PCI procedures followed by CABG in 2009 with LIMA-LAD, SVG-D, seq SVG-OM and PLV, SVG-PDA. Echo (3/12): EF 60-65%, mild MR. // Nuc Stress Test 7/19:  EF 50, artifact, no ischemia, low risk    Carotid artery disease ([place]) 01/31/2017   dopplers 1/14:  0-39% bilat    Carotid stenosis    dopplers 1/14:  0-39% bilat   Cataract    REMOVED   Colon polyps 2006   Colonoscopy-Dr. Patterson(Tubular Adenoma)    Coronary Artery Disease 06/03/2008   Annotation: S/P CABG 2009 Qualifier: Diagnosis of  By: [name] Perches, MD, [name]    DM2 (diabetes mellitus, type 2) ([place])    Followed by Dr. Carlis Abbott   Essential hypertension 06/03/2008   Qualifier: Diagnosis of  By: [name] Perches, MD, [name]    History of echocardiogram    Echo 7/19: Mild LVH, EF [id number], normal diastolic function, trivial AI, moderate LAE   HLD (hyperlipidemia)    HTN (hypertension)    Hx of cardiovascular stress test    ETT-Myoview (03/2013):  Anteroseptal reversible defect, EF 54%, intermediate risk.   Hyperlipidemia 06/03/2008   Qualifier: Diagnosis of  By: [name] Perches, MD, [name]    Ischemic stroke ([place]) 01/30/2019   OSA (obstructive sleep apnea) 08/25/2011   PAD (peripheral artery disease) ([place]) 12/10/2017   Postoperative atrial fibrillation ([place]) 2009   Renal insufficiency 01/31/2019   Stroke [facility])    Type 2 diabetes mellitus with complication, with long-term current use of insulin ([place]) 06/03/2008   Qualifier: Diagnosis of  By: [name] Perches, MD, [name]    Ulcer    left foot   Varicose veins of lower extremities with other  complications [id number]   Past Surgical History:  Procedure Laterality Date   APPENDECTOMY  2005   BACK SURGERY  2006   CARDIAC CATHETERIZATION  2009   EF 60%   COLONOSCOPY     CORONARY ARTERY BYPASS GRAFT  2009   post op. a fib, LIMA -LAD, SVG-D,seq SVG-OM and PLV,  SVG-PDA.   EYE SURGERY     LEFT HEART CATH AND CORS/GRAFTS ANGIOGRAPHY N/A 10/04/2019   Procedure: LEFT HEART CATH AND CORS/GRAFTS ANGIOGRAPHY;  Surgeon: [name] Mocha, MD;  Location: [facility];  Service: Cardiovascular;  Laterality: N/A;   RIGHT/LEFT HEART CATH AND CORONARY/GRAFT ANGIOGRAPHY N/A 08/06/2021   Procedure: RIGHT/LEFT HEART CATH AND CORONARY/GRAFT ANGIOGRAPHY;   Surgeon: [name] Mocha, MD;  Location: [facility];  Service: Cardiovascular;  Laterality: N/A;   SPINE SURGERY     TONSILLECTOMY     Patient Active Problem List   Diagnosis Date Noted   (HFpEF) heart failure with preserved ejection fraction ([place]) 08/11/2021   Ascending aorta dilation ([place]) 08/11/2021   Shortness of breath 07/28/2021   Pain due to onychomycosis of toenails of both feet 01/08/2021   Diabetic neuropathy ([place]) 01/08/2021   Type 2 diabetes mellitus with vascular disease ([place]) 01/08/2021   CKD (chronic kidney disease) 01/31/2019   History of stroke 01/30/2019   PAD (peripheral artery disease) ([place]) 12/10/2017   Carotid artery disease ([place]) 01/31/2017   Venous insufficiency 12/13/2013   OSA (obstructive sleep apnea) 08/25/2011   Type 2 diabetes mellitus with complication, with long-term current use of insulin ([place]) 06/03/2008   Hyperlipidemia LDL goal <70 06/03/2008   Essential hypertension 06/03/2008   Coronary artery disease involving native coronary artery of native heart with angina pectoris ([place]) 06/03/2008    PCP: Celene Squibb, MD  REFERRING PROVIDER: Frederik Pear, MD  REFERRING DIAG: [phone] (ICD-10-CM) - Lumbar spinal stenosis  Rationale for Evaluation and Treatment: Rehabilitation  THERAPY DIAG:  Spinal stenosis of lumbar region, unspecified whether neurogenic claudication present  Low back pain, unspecified back pain laterality, unspecified chronicity, unspecified whether sciatica present  ONSET DATE: about a year and half ago  SUBJECTIVE:                                                                                                                                                                                           SUBJECTIVE STATEMENT: Patient reports that walking is better and is now able to endure longer distances of walking (used to only tolerate around 25 yards but now is able to tolerate around 50-75 yards). Currently denies pain but  claims that it still hurts on his low  back and L buttocks when he walks longer than 75 yards. Patient wishes to be D/C from PT as he thinks that PT has helped a lot and just wants to do his exercises at home. Patient doesn't want to review his HEP as he still have them at home. Doesn't present with any pain at this time  Eval: Pain in the low back sometimes but always have pain in my legs that increases with standing and walking.  Can only walk about 15 min or so and then burning starts down back of both legs. 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  Gradually worsening.  Prolonged sitting can also cause pain although he can sit longer than he can stand/walk  PERTINENT HISTORY:  Discectomy 2007   PAIN:  Are you having pain? Yes: NPRS scale: 0/10 Pain location: mostly low back, buttocks and back of legs and down to back of the knee Pain description: burning Aggravating factors: standing, walking Relieving factors: rest, changing positions  PRECAUTIONS: None  WEIGHT BEARING RESTRICTIONS: No  FALLS:  Has patient fallen in last 6 months? No  OCCUPATION: retired  PLOF: Independent  PATIENT GOALS: be able to travel and get around better  NEXT MD VISIT: 06/11/2022  OBJECTIVE:   DIAGNOSTIC FINDINGS:  MRI done; will bring next visit  PATIENT SURVEYS:  FOTO 63.39 from 11   COGNITION: Overall cognitive status: Within functional limits for tasks assessed     SENSATION: 06/12/22: Light touch: WFL Neuropathy in feet and hands   POSTURE: rounded shoulders and forward head   LUMBAR ROM:   AROM eval 06/12/22  Flexion 80% available 100%  Extension 25%* available 50%  Right lateral flexion    Left lateral flexion    Right rotation  100%  Left rotation  100%   (Blank rows = not tested)  LOWER EXTREMITY MMT:    MMT Right eval Left eval Right 06/12/22 Left 06/12/22  Hip flexion 4+ 4+ 5 5  Hip extension   5 5  Hip abduction   5 5  Hip  adduction      Hip internal rotation      Hip external rotation      Knee flexion   5 5  Knee extension '5 5 5 5  '$ Ankle dorsiflexion '5 5 5   '$ Ankle plantarflexion   5   Ankle inversion      Ankle eversion       (Blank rows = not tested)  FUNCTIONAL TESTS:  5 times sit to stand: 7.58 sec from 13.72 sec 2 minute walk test: 476 ft from 349 ft  GAIT: Distance walked: 476 ft ft Assistive device utilized: None Level of assistance: Complete Independence Comments:  average gait speed, did not report of pain (used to report of pain = 5/10)  TODAY'S TREATMENT:  DATE:  06/12/22 Progress note done (FOTO, ROM, MMT, 2MWT, 5TSTS) Patient education (see below)  06/02/22 Seated hamstring stretch x 30" x 3 Supine figure-of-4 piriformis stretch x 30" x 3 SKTC 10 x 15" each LTR x 10 x 5" Standing abdominal isometrics with physioball x 3" x 10 x 2 Standing hip abd x 10 x 2 Standing hip ext x 10 x 2  05/27/22 Seated hamstring stretch x 30" x 3 SKTC 10 x 15" each LTR x 10 x 5" Standing hip abd x 10 x 2  05/25/22 LTR x 10 x 5" SKTC 10 x 5" each Abdominal bracing 5" x 10 Abdominal bracing with march x 10 Bridge x 10 Bridge with hip adduction x 10 SLR x 10 each Hooklying hip abduction RTB 2 x 10 DKTC with physioball x 10 Dead bug 2 x 10  Sitting: Scapular retractions 2 x 10  05/19/22 SKTC 10 x 5" each LTR 10 x 5" Ab brace 10 x 5" Ab march x 20 Bridge 10 x 5 "   05/15/2022 physical therapy evaluation and HEP instruction   PATIENT EDUCATION:  Education details: Educated on the results of the progress evaluation and the importance of adherence to HEP and to taper it to 3-5x/week on the 2nd month after D/C Person educated: Patient Education method: Explanation Education comprehension: verbalized understanding   HOME EXERCISE PROGRAM: 05/25/22 Bridge with hip  adduction, hip abduction with RTB , SLR  Access Code: [email or web address] URL: [email or web address] 06/02/22 - Supine Piriformis Stretch with Foot on Ground  - 2 x daily - 7 x weekly - 3 reps - 30 hold - Standing Hip Extension with Counter Support  - 2 x daily - 7 x weekly - 2 sets - 10 reps - Standing Hip Abduction with Counter Support  - 2 x daily - 7 x weekly - 2 sets - 10 reps  05/27/22 - Seated Hamstring Stretch  - 2 x daily - 7 x weekly - 3 reps - 30 hold - Hooklying Single Knee to Chest Stretch  - 2 x daily - 7 x weekly - 5 reps - 15 hold  05/19/22 - Supine Lower Trunk Rotation  - 2 x daily - 7 x weekly - 2 sets - 10 reps - 5 second hold - Supine Transversus Abdominis Bracing - Hands on Stomach  - 2 x daily - 7 x weekly - 2 sets - 10 reps - Hooklying Single Knee to Chest Stretch  - 2 x daily - 7 x weekly - 2 sets - 10 reps - 5 second hold - Supine Bridge  - 2 x daily - 7 x weekly - 2 sets - 10 reps - 5 second hold - Supine March  - 2 x daily - 7 x weekly - 2 sets - 10 reps  Date: 05/15/2022 Prepared by: [facility] - Rehab  Exercises - Supine Lower Trunk Rotation  - 2 x daily - 7 x weekly - 3 sets - 10 reps - Supine Transversus Abdominis Bracing - Hands on Stomach  - 2 x daily - 7 x weekly - 3 sets - 10 reps - Hooklying Single Knee to Chest Stretch  - 2 x daily - 7 x weekly - 3 sets - 10 reps  ASSESSMENT:  CLINICAL IMPRESSION: Patient demonstrated continued improvements in function as indicated by positive significant changes in FOTO, 2MWT, and 5TSTS. Patient doesn't present with impairments in strength and ROM as well. Patient states that he will be compliant with his HEP.  With this, skilled PT is not required at this time and patient is D/C to indep HEP.  OBJECTIVE IMPAIRMENTS: decreased endurance and pain.   ACTIVITY LIMITATIONS: locomotion level  PARTICIPATION LIMITATIONS: community activity and yard work  [employer] POTENTIAL: Good  CLINICAL DECISION MAKING:  Stable/uncomplicated  EVALUATION COMPLEXITY: Low   GOALS: Goals reviewed with patient? No  SHORT TERM GOALS: Target date: 05/29/2022  patient will be independent with initial HEP   Baseline: Goal status: MET  2.  Patient will self report 30% improvement to improve tolerance for functional activity   Baseline:  Goal status: MET  LONG TERM GOALS: Target date: 06/22/2022  Patient will be independent in self management strategies to improve quality of life and functional outcomes. Baseline:  Goal status: MET  2.  Patient will self report 50% improvement to improve tolerance for functional activity   Baseline:  Goal status: MET  3.  Patient will improve FOTO score to predicted value   Baseline: 47 Goal status: MET  4.  Patient will ambulate x 15 min without leg pain to so light shopping without issue Baseline:  Goal status: INITIAL  5.  Patient will increase distance on 2 MWT to 400 ft to demonstrate improved functional mobility with community ambulation Baseline: 349 ft Goal status: MET  PLAN:  PT FREQUENCY:  0  PT DURATION: other: 0  PLANNED INTERVENTIONS: Skilled PT not indicated at this time. D/C to HEP.  Harvie Heck. Sevan Mcbroom, PT, DPT, OCS Board-Certified Clinical Specialist in [place] # ([place]): [phone] T 3:37 PM, 06/12/22

## 2022-06-16 ENCOUNTER — Other Ambulatory Visit: Payer: Self-pay | Admitting: Physician Assistant

## 2022-06-22 ENCOUNTER — Telehealth: Payer: Self-pay | Admitting: Physician Assistant

## 2022-06-22 DIAGNOSIS — I10 Essential (primary) hypertension: Secondary | ICD-10-CM | POA: Diagnosis not present

## 2022-06-22 DIAGNOSIS — N189 Chronic kidney disease, unspecified: Secondary | ICD-10-CM | POA: Diagnosis not present

## 2022-06-22 DIAGNOSIS — I639 Cerebral infarction, unspecified: Secondary | ICD-10-CM | POA: Diagnosis not present

## 2022-06-22 DIAGNOSIS — E7800 Pure hypercholesterolemia, unspecified: Secondary | ICD-10-CM | POA: Diagnosis not present

## 2022-06-22 DIAGNOSIS — E1140 Type 2 diabetes mellitus with diabetic neuropathy, unspecified: Secondary | ICD-10-CM | POA: Diagnosis not present

## 2022-06-22 DIAGNOSIS — I2510 Atherosclerotic heart disease of native coronary artery without angina pectoris: Secondary | ICD-10-CM | POA: Diagnosis not present

## 2022-06-22 DIAGNOSIS — I77810 Thoracic aortic ectasia: Secondary | ICD-10-CM

## 2022-06-22 DIAGNOSIS — E1165 Type 2 diabetes mellitus with hyperglycemia: Secondary | ICD-10-CM | POA: Diagnosis not present

## 2022-06-22 NOTE — Telephone Encounter (Signed)
Patient called stating he had an echo done back in January he is going to have a copy sent to [place].  He wants to know if that will okay to use and if he will not to have an other one done in May.

## 2022-06-23 NOTE — Telephone Encounter (Signed)
Send copy of echocardiogram report to me when received and I can look at it to determine if he needs a repeat done in May or not. [name], PA-C    06/23/2022 1:51 PM

## 2022-06-24 NOTE — Telephone Encounter (Signed)
Reviewed echocardiogram done 05/05/22: EF 52, Gr 1 DD, mean AV gradient 5 mmHg, trace AI, Gr 1 DD. Aorta size is normal on this study. EF is low normal and similar to the echocardiogram done last year. This echocardiogram is overall stable.  PLAN: -We can cancel the echocardiogram scheduled for May 2024 -F/u as planned.  [name], PA-C    06/24/2022 5:48 PM

## 2022-06-25 ENCOUNTER — Encounter: Payer: Self-pay | Admitting: *Deleted

## 2022-06-25 NOTE — Telephone Encounter (Signed)
Left pt detailed message that we can cancel his Echo for May.  Will send pt message via mychart.

## 2022-06-30 DIAGNOSIS — B0230 Zoster ocular disease, unspecified: Secondary | ICD-10-CM | POA: Diagnosis not present

## 2022-07-03 ENCOUNTER — Other Ambulatory Visit: Payer: Self-pay | Admitting: Cardiovascular Disease

## 2022-07-10 ENCOUNTER — Encounter: Payer: Self-pay | Admitting: Podiatry

## 2022-07-10 ENCOUNTER — Ambulatory Visit (INDEPENDENT_AMBULATORY_CARE_PROVIDER_SITE_OTHER): Payer: No Typology Code available for payment source | Admitting: Podiatry

## 2022-07-10 DIAGNOSIS — E1159 Type 2 diabetes mellitus with other circulatory complications: Secondary | ICD-10-CM

## 2022-07-10 DIAGNOSIS — Z961 Presence of intraocular lens: Secondary | ICD-10-CM | POA: Diagnosis not present

## 2022-07-10 DIAGNOSIS — M79675 Pain in left toe(s): Secondary | ICD-10-CM | POA: Diagnosis not present

## 2022-07-10 DIAGNOSIS — B351 Tinea unguium: Secondary | ICD-10-CM

## 2022-07-10 DIAGNOSIS — B0239 Other herpes zoster eye disease: Secondary | ICD-10-CM | POA: Diagnosis not present

## 2022-07-10 DIAGNOSIS — M79674 Pain in right toe(s): Secondary | ICD-10-CM

## 2022-07-10 DIAGNOSIS — H532 Diplopia: Secondary | ICD-10-CM | POA: Diagnosis not present

## 2022-07-10 DIAGNOSIS — H4922 Sixth [abducent] nerve palsy, left eye: Secondary | ICD-10-CM | POA: Diagnosis not present

## 2022-07-10 NOTE — Progress Notes (Signed)
This patient returns to my office for at risk foot care.  This patient requires this care by a professional since this patient will be at risk due to having Renal insufficiency, CVA, PAD, T2DM.  He is taking plavix.   This patient is unable to cut nails himself since the patient cannot reach his nails.These nails are painful walking and wearing shoes.  This patient presents for at risk foot care today.  General Appearance  Alert, conversant and in no acute stress.  Vascular  Dorsalis pedis and posterior tibial  pulses are absent  bilaterally.  Capillary return is within normal limits  bilaterally. Temperature is within normal limits  bilaterally.  Neurologic  Senn-Weinstein monofilament wire test absent  bilaterally. Muscle power within normal limits bilaterally.  Nails Thick disfigured discolored nails with subungual debris  from hallux to fifth toes bilaterally. No evidence of bacterial infection or drainage bilaterally.  Orthopedic  No limitations of motion  feet .  No crepitus or effusions noted.  No bony pathology or digital deformities noted.HAV  B/L.  DJD midfoot  B/L.    Skin  normotropic skin noted bilaterally.  No signs of infections or ulcers noted.   Asymptomatic callus sub 5 left.  Patient used to have ulcers at these sites.  Onychomycosis  Pain in right toes  Pain in left toes Diabetes wth angiopathy and neurologic deficit.  Consent was obtained for treatment procedures.   Mechanical debridement of nails 1-5  bilaterally performed with a nail nipper.  Filed with dremel without incident. Recommend spenco insoles for shoes.   Return office visit    10 weeks                   Told patient to return for periodic foot care and evaluation due to potential at risk complications.   [name] DPM   

## 2022-07-14 DIAGNOSIS — B0239 Other herpes zoster eye disease: Secondary | ICD-10-CM | POA: Diagnosis not present

## 2022-07-14 DIAGNOSIS — Z961 Presence of intraocular lens: Secondary | ICD-10-CM | POA: Diagnosis not present

## 2022-07-14 DIAGNOSIS — H532 Diplopia: Secondary | ICD-10-CM | POA: Diagnosis not present

## 2022-07-30 NOTE — Progress Notes (Signed)
Cardiology Office Note:    Date:  07/31/2022  ID:  [name], DOB 04-07-1950, MRN [id number] PCP: Benita Stabile, MD  [facility] Providers Cardiologist:  Tonny Bollman, MD Cardiology APP:  Beatrice Lecher, PA-C          Patient Profile:   Coronary artery disease  S/p CABG in 2009 Myoview 10/29/2017: No ischemia, low risk Cath 6/21: patent grafts, Med Rx  LHC 08/06/2021: LAD proximal 100, LCx 100, RCA severe subtotal stenosis; LIMA-LAD, SVG-OM/RPL (mid 40-50), SVG-D1, SVG-RPDA patent; PCWP 14, mean PA 25 (HFpEF) heart failure with preserved ejection fraction  TTE 08/11/2021: EF 50-55, no RWMA, GRII DD, mild reduced RVSF, RVSP 29.8 (normal PASP), mild LAE, trivial MR, trivial AI, mildly dilated ascending aorta (41 mm)  TTE ([facility]) 05/05/22: EF 52, Gr 1 DD, mean AV gradient 5 mmHg, trace AI, Gr 1 DD. Aorta size is normal on this study.  Hypertension  Hyperlipidemia  Diabetes mellitus  Hx of diabetic foot ulcer Peripheral arterial disease   eval by Dr. Kirke Corin in 2019 >> small vessel dz >> med Rx  S/p cryptogenic CVA 01/2019 2 vascular territories (R temporoparietal and occipital) - ?cardioembolic  Event monitor done by PCP - No AFib  Pt declined ref to EP for ILR Chronic kidney disease  Dilated ascending aorta  TTE 08/2021: 41 mm TTE 04/2022: Normal Carotid US 01/31/2019: Minimal plaque, no hemodynamically significant stenosis OSA      History of Present Illness:   [name] is a 72 y.o. male who returns for follow-up of CAD.  He was last seen 11/26/2021. He is here alone. He is doing well w/o exertional chest pain, syncope, orthopnea. He gets short of breath with more moderate to heavy exertion. He has chronic dependent leg edema.   Review of Systems  Cardiovascular:  Negative for claudication.  Skin:  Positive for rash (recent shingles episode - L frontal area).  Musculoskeletal:  Positive for back pain.   See HPI    Studies Reviewed:    EKG:   sinus bradycardia, HR 59, inf Qs, non-specific ST-TW changes, QTc 421 ms, no change from prior EKG  Risk Assessment/Calculations:             Physical Exam:   VS:  BP 116/60   Pulse (!) 59   Ht 5\' 10"  (1.778 m)   Wt 237 lb 6.4 oz (107.7 kg)   SpO2 93%   BMI 34.06 kg/m    Wt Readings from Last 3 Encounters:  07/31/22 237 lb 6.4 oz (107.7 kg)  11/26/21 241 lb 3.2 oz (109.4 kg)  08/20/21 246 lb 6.4 oz (111.8 kg)    Constitutional:      Appearance: Healthy appearance. Not in distress.  Neck:     Vascular: JVD normal.  Pulmonary:     Breath sounds: Normal breath sounds. No wheezing. No rales.  Cardiovascular:     Bradycardia present. Regular rhythm.     Murmurs: There is no murmur.  Edema:    Peripheral edema present.    Pretibial: bilateral 1+ edema of the pretibial area. Abdominal:     Palpations: Abdomen is soft.  Skin:    Findings: Rash (few scattered crusted lesions noted above L orbit) present.       ASSESSMENT AND PLAN:   Coronary artery disease involving native coronary artery of native heart with angina pectoris (HCC) History of bypass in 2009.  Cardiac catheterization May 2023 with patent bypass grafts.  He is not having chest pain to suggest angina. He does note some L sided chest pain when he lays on that side. He has not had exertional chest pain. I suspect this is MSK. No testing is warranted at this time. Continue Amlodipine 10 mg once daily, ASA 81 mg once daily, Plavix 75 mg once daily, Zetia 10 mg once daily, Imdur 60 mg once daily, Metoprolol tartrate 100 mg twice daily, prn NTG, Rosuvastatin 5 mg once daily. F/u 1 year.   (HFpEF) heart failure with preserved ejection fraction (HCC) Volume status stable. He has venous insufficiency which contributes to his edema. Continue Furosemide 60 mg once daily.  Hyperlipidemia LDL goal <70 Lipid panel [facility] reviewed. LDL in 03/2022 was above goal at 72. Previously his LDL was at goal. Will request recent labs from  primary care and endocrinology. Continue Rosuvastatin 5 mg once daily, Zetia 10 mg once daily, Fenofibrate 54 mg once daily. Continue to follow Lipids. If LDL remains > 70, consider referral back Lipid clinic vs increasing Rosuvastatin.   PAD (peripheral artery disease) (HCC) Small vessel disease. He has some back pain but no symptoms of intermittent claudication.  Ascending aorta dilation (HCC) 41 mm on echocardiogram in 08/2021. Echocardiogram in 04/2022 with normal aorta size.   Essential hypertension BP well controlled.  Continue amlodipine 10 mg daily, Imdur 60 mg daily, metoprolol tartrate 100 mg twice daily.  Type 2 diabetes mellitus with complication, with long-term current use of insulin (HCC) Followed by endocrinology.  CKD (chronic kidney disease) Request most recent labs from primary care and endocrinology.  Creatinine in May 2023 was 1.52.       Dispo:  Return in about 1 year (around 07/31/2023) for Routine Follow Up, w/ Tereso Newcomer, PA-C.  Signed, Tereso Newcomer, PA-C

## 2022-07-31 ENCOUNTER — Encounter: Payer: Self-pay | Admitting: Physician Assistant

## 2022-07-31 ENCOUNTER — Ambulatory Visit: Payer: No Typology Code available for payment source | Attending: Physician Assistant | Admitting: Physician Assistant

## 2022-07-31 VITALS — BP 116/60 | HR 59 | Ht 70.0 in | Wt 237.4 lb

## 2022-07-31 DIAGNOSIS — I77810 Thoracic aortic ectasia: Secondary | ICD-10-CM | POA: Diagnosis not present

## 2022-07-31 DIAGNOSIS — N1831 Chronic kidney disease, stage 3a: Secondary | ICD-10-CM

## 2022-07-31 DIAGNOSIS — I25119 Atherosclerotic heart disease of native coronary artery with unspecified angina pectoris: Secondary | ICD-10-CM

## 2022-07-31 DIAGNOSIS — Z794 Long term (current) use of insulin: Secondary | ICD-10-CM | POA: Diagnosis not present

## 2022-07-31 DIAGNOSIS — I10 Essential (primary) hypertension: Secondary | ICD-10-CM

## 2022-07-31 DIAGNOSIS — E118 Type 2 diabetes mellitus with unspecified complications: Secondary | ICD-10-CM | POA: Diagnosis not present

## 2022-07-31 DIAGNOSIS — I5032 Chronic diastolic (congestive) heart failure: Secondary | ICD-10-CM

## 2022-07-31 DIAGNOSIS — I739 Peripheral vascular disease, unspecified: Secondary | ICD-10-CM

## 2022-07-31 DIAGNOSIS — E785 Hyperlipidemia, unspecified: Secondary | ICD-10-CM

## 2022-07-31 NOTE — Assessment & Plan Note (Signed)
Request most recent labs from primary care and endocrinology.  Creatinine in May 2023 was 1.52.

## 2022-07-31 NOTE — Assessment & Plan Note (Signed)
Small vessel disease. He has some back pain but no symptoms of intermittent claudication.

## 2022-07-31 NOTE — Assessment & Plan Note (Signed)
Volume status stable. He has venous insufficiency which contributes to his edema. Continue Furosemide 60 mg once daily.

## 2022-07-31 NOTE — Assessment & Plan Note (Signed)
Followed by endocrinology 

## 2022-07-31 NOTE — Assessment & Plan Note (Signed)
41 mm on echocardiogram in 08/2021. Echocardiogram in 04/2022 with normal aorta size.

## 2022-07-31 NOTE — Assessment & Plan Note (Signed)
Lipid panel from [facility] reviewed. LDL in 03/2022 was above goal at 72. Previously his LDL was at goal. Will request recent labs from primary care and endocrinology. Continue Rosuvastatin 5 mg once daily, Zetia 10 mg once daily, Fenofibrate 54 mg once daily. Continue to follow Lipids. If LDL remains > 70, consider referral back Lipid clinic vs increasing Rosuvastatin.

## 2022-07-31 NOTE — Assessment & Plan Note (Signed)
BP well controlled.  Continue amlodipine 10 mg daily, Imdur 60 mg daily, metoprolol tartrate 100 mg twice daily.

## 2022-07-31 NOTE — Assessment & Plan Note (Signed)
History of bypass in 2009.  Cardiac catheterization May 2023 with patent bypass grafts.  He is not having chest pain to suggest angina. He does note some L sided chest pain when he lays on that side. He has not had exertional chest pain. I suspect this is MSK. No testing is warranted at this time. Continue Amlodipine 10 mg once daily, ASA 81 mg once daily, Plavix 75 mg once daily, Zetia 10 mg once daily, Imdur 60 mg once daily, Metoprolol tartrate 100 mg twice daily, prn NTG, Rosuvastatin 5 mg once daily. F/u 1 year.

## 2022-07-31 NOTE — Patient Instructions (Signed)
Medication Instructions:  Your physician recommends that you continue on your current medications as directed. Please refer to the Current Medication list given to you today.  *If you need a refill on your cardiac medications before your next appointment, please call your pharmacy*   Lab Work: None ordered  If you have labs (blood work) drawn today and your tests are completely normal, you will receive your results only by: MyChart Message (if you have MyChart) OR A paper copy in the mail If you have any lab test that is abnormal or we need to change your treatment, we will call you to review the results.   Testing/Procedures: None ordered   Follow-Up: At [facility], you and your health needs are our priority.  As part of our continuing mission to provide you with exceptional heart care, we have created designated Provider Care Teams.  These Care Teams include your primary Cardiologist (physician) and Advanced Practice Providers (APPs -  Physician Assistants and Nurse Practitioners) who all work together to provide you with the care you need, when you need it.  We recommend signing up for the patient portal called "MyChart".  Sign up information is provided on this After Visit Summary.  MyChart is used to connect with patients for Virtual Visits (Telemedicine).  Patients are able to view lab/test results, encounter notes, upcoming appointments, etc.  Non-urgent messages can be sent to your provider as well.   To learn more about what you can do with MyChart, go to [email or web address].    Your next appointment:   1 year(s)  Provider:   Scott Weaver, PA-C         Other Instructions   

## 2022-08-29 ENCOUNTER — Other Ambulatory Visit: Payer: Self-pay | Admitting: Physician Assistant

## 2022-09-01 DIAGNOSIS — H532 Diplopia: Secondary | ICD-10-CM | POA: Diagnosis not present

## 2022-09-01 DIAGNOSIS — Z961 Presence of intraocular lens: Secondary | ICD-10-CM | POA: Diagnosis not present

## 2022-09-01 DIAGNOSIS — H5022 Vertical strabismus, left eye: Secondary | ICD-10-CM | POA: Diagnosis not present

## 2022-09-03 DIAGNOSIS — E559 Vitamin D deficiency, unspecified: Secondary | ICD-10-CM | POA: Diagnosis not present

## 2022-09-03 DIAGNOSIS — I10 Essential (primary) hypertension: Secondary | ICD-10-CM | POA: Diagnosis not present

## 2022-09-03 DIAGNOSIS — E1165 Type 2 diabetes mellitus with hyperglycemia: Secondary | ICD-10-CM | POA: Diagnosis not present

## 2022-09-03 DIAGNOSIS — Z125 Encounter for screening for malignant neoplasm of prostate: Secondary | ICD-10-CM | POA: Diagnosis not present

## 2022-09-04 LAB — LAB REPORT - SCANNED
A1c: 7.8
Creatinine, POC: 62.9 mg/dL
EGFR: 55
Microalb Creat Ratio: 17

## 2022-09-09 ENCOUNTER — Other Ambulatory Visit: Payer: Self-pay | Admitting: Cardiovascular Disease

## 2022-09-11 DIAGNOSIS — Z0000 Encounter for general adult medical examination without abnormal findings: Secondary | ICD-10-CM | POA: Diagnosis not present

## 2022-09-11 DIAGNOSIS — Z0001 Encounter for general adult medical examination with abnormal findings: Secondary | ICD-10-CM | POA: Diagnosis not present

## 2022-09-14 ENCOUNTER — Encounter: Payer: Self-pay | Admitting: Family Medicine

## 2022-09-14 DIAGNOSIS — E1165 Type 2 diabetes mellitus with hyperglycemia: Secondary | ICD-10-CM | POA: Diagnosis not present

## 2022-09-14 DIAGNOSIS — I509 Heart failure, unspecified: Secondary | ICD-10-CM | POA: Diagnosis not present

## 2022-09-14 DIAGNOSIS — E559 Vitamin D deficiency, unspecified: Secondary | ICD-10-CM | POA: Diagnosis not present

## 2022-09-14 DIAGNOSIS — I130 Hypertensive heart and chronic kidney disease with heart failure and stage 1 through stage 4 chronic kidney disease, or unspecified chronic kidney disease: Secondary | ICD-10-CM | POA: Diagnosis not present

## 2022-09-14 DIAGNOSIS — M79604 Pain in right leg: Secondary | ICD-10-CM | POA: Diagnosis not present

## 2022-09-14 DIAGNOSIS — M79605 Pain in left leg: Secondary | ICD-10-CM | POA: Diagnosis not present

## 2022-09-14 DIAGNOSIS — E782 Mixed hyperlipidemia: Secondary | ICD-10-CM | POA: Diagnosis not present

## 2022-09-14 DIAGNOSIS — M25561 Pain in right knee: Secondary | ICD-10-CM | POA: Diagnosis not present

## 2022-09-14 DIAGNOSIS — E1122 Type 2 diabetes mellitus with diabetic chronic kidney disease: Secondary | ICD-10-CM | POA: Diagnosis not present

## 2022-09-14 DIAGNOSIS — N1831 Chronic kidney disease, stage 3a: Secondary | ICD-10-CM | POA: Diagnosis not present

## 2022-09-14 DIAGNOSIS — Z951 Presence of aortocoronary bypass graft: Secondary | ICD-10-CM | POA: Diagnosis not present

## 2022-09-14 DIAGNOSIS — I10 Essential (primary) hypertension: Secondary | ICD-10-CM | POA: Diagnosis not present

## 2022-10-12 ENCOUNTER — Encounter: Payer: Self-pay | Admitting: Podiatry

## 2022-10-12 ENCOUNTER — Ambulatory Visit: Payer: No Typology Code available for payment source | Admitting: Podiatry

## 2022-10-12 DIAGNOSIS — M79674 Pain in right toe(s): Secondary | ICD-10-CM | POA: Diagnosis not present

## 2022-10-12 DIAGNOSIS — B351 Tinea unguium: Secondary | ICD-10-CM

## 2022-10-12 DIAGNOSIS — E1159 Type 2 diabetes mellitus with other circulatory complications: Secondary | ICD-10-CM | POA: Diagnosis not present

## 2022-10-12 DIAGNOSIS — M2010 Hallux valgus (acquired), unspecified foot: Secondary | ICD-10-CM

## 2022-10-12 DIAGNOSIS — M79675 Pain in left toe(s): Secondary | ICD-10-CM

## 2022-10-12 NOTE — Progress Notes (Signed)
This patient returns to my office for at risk foot care.  This patient requires this care by a professional since this patient will be at risk due to having Renal insufficiency, CVA, PAD, T2DM.  He is taking plavix.   This patient is unable to cut nails himself since the patient cannot reach his nails.These nails are painful walking and wearing shoes.  This patient presents for at risk foot care today.  General Appearance  Alert, conversant and in no acute stress.  Vascular  Dorsalis pedis and posterior tibial  pulses are absent  bilaterally.  Capillary return is within normal limits  bilaterally. Temperature is within normal limits  bilaterally.  Neurologic  Senn-Weinstein monofilament wire test absent  bilaterally. Muscle power within normal limits bilaterally.  Nails Thick disfigured discolored nails with subungual debris  from hallux to fifth toes bilaterally. No evidence of bacterial infection or drainage bilaterally.  Orthopedic  No limitations of motion  feet .  No crepitus or effusions noted.  No bony pathology or digital deformities noted.HAV  B/L.  DJD midfoot  B/L.    Skin  normotropic skin noted bilaterally.  No signs of infections or ulcers noted.   Asymptomatic callus sub 5 left.  Patient used to have ulcers at these sites.  Onychomycosis  Pain in right toes  Pain in left toes Diabetes wth angiopathy and neurologic deficit.  Consent was obtained for treatment procedures.   Mechanical debridement of nails 1-5  bilaterally performed with a nail nipper.  Filed with dremel without incident. To be measured for diabetic shoes.   Return office visit    10 weeks                   Told patient to return for periodic foot care and evaluation due to potential at risk complications.   Helane Gunther DPM

## 2022-10-19 DIAGNOSIS — E113293 Type 2 diabetes mellitus with mild nonproliferative diabetic retinopathy without macular edema, bilateral: Secondary | ICD-10-CM | POA: Diagnosis not present

## 2022-10-26 DIAGNOSIS — E1165 Type 2 diabetes mellitus with hyperglycemia: Secondary | ICD-10-CM | POA: Diagnosis not present

## 2022-10-26 DIAGNOSIS — E7800 Pure hypercholesterolemia, unspecified: Secondary | ICD-10-CM | POA: Diagnosis not present

## 2022-10-26 DIAGNOSIS — N189 Chronic kidney disease, unspecified: Secondary | ICD-10-CM | POA: Diagnosis not present

## 2022-10-29 DIAGNOSIS — I10 Essential (primary) hypertension: Secondary | ICD-10-CM | POA: Diagnosis not present

## 2022-10-29 DIAGNOSIS — E1165 Type 2 diabetes mellitus with hyperglycemia: Secondary | ICD-10-CM | POA: Diagnosis not present

## 2022-10-29 DIAGNOSIS — E1140 Type 2 diabetes mellitus with diabetic neuropathy, unspecified: Secondary | ICD-10-CM | POA: Diagnosis not present

## 2022-10-29 DIAGNOSIS — I2510 Atherosclerotic heart disease of native coronary artery without angina pectoris: Secondary | ICD-10-CM | POA: Diagnosis not present

## 2022-10-29 DIAGNOSIS — N189 Chronic kidney disease, unspecified: Secondary | ICD-10-CM | POA: Diagnosis not present

## 2022-10-29 DIAGNOSIS — E7800 Pure hypercholesterolemia, unspecified: Secondary | ICD-10-CM | POA: Diagnosis not present

## 2022-10-29 DIAGNOSIS — I639 Cerebral infarction, unspecified: Secondary | ICD-10-CM | POA: Diagnosis not present

## 2022-11-23 ENCOUNTER — Other Ambulatory Visit: Payer: Self-pay

## 2022-11-23 MED ORDER — METOPROLOL TARTRATE 100 MG PO TABS: 100.0000 mg | ORAL_TABLET | Freq: Two times a day (BID) | ORAL | 2 refills | Status: DC

## 2022-11-24 DIAGNOSIS — E1143 Type 2 diabetes mellitus with diabetic autonomic (poly)neuropathy: Secondary | ICD-10-CM | POA: Diagnosis not present

## 2022-11-24 DIAGNOSIS — B351 Tinea unguium: Secondary | ICD-10-CM | POA: Diagnosis not present

## 2022-12-08 ENCOUNTER — Other Ambulatory Visit: Payer: Self-pay | Admitting: *Deleted

## 2022-12-08 MED ORDER — NITROGLYCERIN 0.4 MG SL SUBL: SUBLINGUAL_TABLET | SUBLINGUAL | 4 refills | Status: DC

## 2022-12-21 ENCOUNTER — Ambulatory Visit: Payer: No Typology Code available for payment source | Admitting: Podiatry

## 2022-12-23 DIAGNOSIS — Z008 Encounter for other general examination: Secondary | ICD-10-CM | POA: Diagnosis not present

## 2022-12-23 DIAGNOSIS — R2681 Unsteadiness on feet: Secondary | ICD-10-CM | POA: Diagnosis not present

## 2022-12-23 DIAGNOSIS — Z86010 Personal history of colonic polyps: Secondary | ICD-10-CM | POA: Diagnosis not present

## 2022-12-23 DIAGNOSIS — E669 Obesity, unspecified: Secondary | ICD-10-CM | POA: Diagnosis not present

## 2022-12-23 DIAGNOSIS — Z6833 Body mass index (BMI) 33.0-33.9, adult: Secondary | ICD-10-CM | POA: Diagnosis not present

## 2022-12-23 DIAGNOSIS — E785 Hyperlipidemia, unspecified: Secondary | ICD-10-CM | POA: Diagnosis not present

## 2022-12-23 DIAGNOSIS — D692 Other nonthrombocytopenic purpura: Secondary | ICD-10-CM | POA: Diagnosis not present

## 2022-12-24 DIAGNOSIS — E1143 Type 2 diabetes mellitus with diabetic autonomic (poly)neuropathy: Secondary | ICD-10-CM | POA: Diagnosis not present

## 2023-01-08 DIAGNOSIS — I10 Essential (primary) hypertension: Secondary | ICD-10-CM | POA: Diagnosis not present

## 2023-01-08 DIAGNOSIS — E559 Vitamin D deficiency, unspecified: Secondary | ICD-10-CM | POA: Diagnosis not present

## 2023-01-08 DIAGNOSIS — Z125 Encounter for screening for malignant neoplasm of prostate: Secondary | ICD-10-CM | POA: Diagnosis not present

## 2023-01-08 DIAGNOSIS — E1165 Type 2 diabetes mellitus with hyperglycemia: Secondary | ICD-10-CM | POA: Diagnosis not present

## 2023-01-09 LAB — LAB REPORT - SCANNED
A1c: 7.9
Albumin, Urine POC: 16.7
Creatinine, POC: 62.3 mg/dL
EGFR: 55
Microalb Creat Ratio: 27

## 2023-01-14 DIAGNOSIS — I130 Hypertensive heart and chronic kidney disease with heart failure and stage 1 through stage 4 chronic kidney disease, or unspecified chronic kidney disease: Secondary | ICD-10-CM | POA: Diagnosis not present

## 2023-01-14 DIAGNOSIS — E1122 Type 2 diabetes mellitus with diabetic chronic kidney disease: Secondary | ICD-10-CM | POA: Diagnosis not present

## 2023-01-14 DIAGNOSIS — I509 Heart failure, unspecified: Secondary | ICD-10-CM | POA: Diagnosis not present

## 2023-01-14 DIAGNOSIS — Z951 Presence of aortocoronary bypass graft: Secondary | ICD-10-CM | POA: Diagnosis not present

## 2023-01-14 DIAGNOSIS — E559 Vitamin D deficiency, unspecified: Secondary | ICD-10-CM | POA: Diagnosis not present

## 2023-01-14 DIAGNOSIS — E785 Hyperlipidemia, unspecified: Secondary | ICD-10-CM | POA: Diagnosis not present

## 2023-01-14 DIAGNOSIS — M79605 Pain in left leg: Secondary | ICD-10-CM | POA: Diagnosis not present

## 2023-01-14 DIAGNOSIS — M79604 Pain in right leg: Secondary | ICD-10-CM | POA: Diagnosis not present

## 2023-01-14 DIAGNOSIS — N1831 Chronic kidney disease, stage 3a: Secondary | ICD-10-CM | POA: Diagnosis not present

## 2023-01-14 DIAGNOSIS — I10 Essential (primary) hypertension: Secondary | ICD-10-CM | POA: Diagnosis not present

## 2023-01-14 DIAGNOSIS — M25561 Pain in right knee: Secondary | ICD-10-CM | POA: Diagnosis not present

## 2023-01-14 DIAGNOSIS — E782 Mixed hyperlipidemia: Secondary | ICD-10-CM | POA: Diagnosis not present

## 2023-01-17 NOTE — Progress Notes (Unsigned)
GI Office Note    Referring Provider: Lupita Raider, NP Primary Care Physician:  Lupita Raider, NP  Primary Gastroenterologist:  Chief Complaint   No chief complaint on file.    History of Present Illness   [name] is a 72 y.o. male presenting today     Scanned labs*** 01/2023: A1C 7.9, creatinine 1.39, sodium 140, alb 4.3, tbili 0.4, ap 82, AST 32, ALT 24, WBC 6.4, Hgb 13.9, Platelets 245  Colonoscopy 06/2012: Sheryn Bison, MD Personal history of colon polyps. FH of colon cancer Normal colon.  Repeat colonoscopy five years     Medications   Current Outpatient Medications  Medication Sig Dispense Refill   acetaminophen (TYLENOL) 500 MG tablet Take 500 mg by mouth every 6 (six) hours as needed for moderate pain.     amitriptyline (ELAVIL) 75 MG tablet Take 75 mg by mouth at bedtime.     amLODipine (NORVASC) 10 MG tablet TAKE 1 TABLET BY MOUTH EVERY DAY 90 tablet 2   aspirin 81 MG EC tablet Take 1 tablet (81 mg total) by mouth daily. 90 tablet 3   clopidogrel (PLAVIX) 75 MG tablet TAKE 1 TABLET BY MOUTH EVERY DAY 90 tablet 2   ezetimibe (ZETIA) 10 MG tablet Take 1 tablet (10 mg total) by mouth daily. 90 tablet 3   fenofibrate 54 MG tablet TAKE 1 TABLET BY MOUTH EVERY DAY 90 tablet 2   furosemide (LASIX) 40 MG tablet TAKE 1 TABLET BY MOUTH TWICE A DAY FOR 3 DAYS THEN REDUCE DOWN TO 1 TABLET BY MOUTH DAILY 90 tablet 3   glucose monitoring kit (FREESTYLE) monitoring kit 1 each by Does not apply route as needed for other. Freestyle Libre     isosorbide mononitrate (IMDUR) 60 MG 24 hr tablet TAKE 1 TABLET DAILY 90 tablet 1   metFORMIN (GLUCOPHAGE-XR) 500 MG 24 hr tablet Take 500 mg by mouth 2 (two) times daily.     metoprolol tartrate (LOPRESSOR) 100 MG tablet Take 1 tablet (100 mg total) by mouth 2 (two) times daily. 180 tablet 2   nitroGLYCERIN (NITROSTAT) 0.4 MG SL tablet PLACE 1 TABLET UNDER THE TONGUE EVERY 5 MINUTES AS NEEDED FOR CHEST PAIN 25 tablet 4    NOVOLOG MIX 70/30 FLEXPEN (70-30) 100 UNIT/ML Pen Inject 40-60 Units into the skin 3 (three) times daily. Per sliding scale     rosuvastatin (CRESTOR) 5 MG tablet TAKE 1 TABLET (5 MG TOTAL) BY MOUTH DAILY. 90 tablet 3   Semaglutide, 1 MG/DOSE, (OZEMPIC, 1 MG/DOSE,) 2 MG/1.5ML SOPN Inject 2 mg into the skin every Wednesday.     No current facility-administered medications for this visit.    Allergies   Allergies as of 01/18/2023 - Review Complete 10/12/2022  Allergen Reaction Noted   Penicillins Other (See Comments) 08/24/2011    Past Medical History   Past Medical History:  Diagnosis Date   (HFpEF) heart failure with preserved ejection fraction (HCC) 08/11/2021   Echo 08/2021: EF 50-55, no RWMA, GR 2 DD, mildly reduced RVSF, normal PASP, RVSP 29.8, mild LAE, trivial MR, trivial AI, mild dilation of ascending aorta (41 mm)   Allergy    Ascending aorta dilation (HCC) 08/11/2021   Echo 08/2021: EF 50-55, no RWMA, GR 2 DD, mildly reduced RVSF, normal PASP, RVSP 29.8, mild LAE, trivial MR, trivial AI, mild dilation of ascending aorta (41 mm)   CAD (coronary artery disease), native coronary artery    Multiple PCI procedures followed by CABG  in 2009 with LIMA-LAD, SVG-D, seq SVG-OM and PLV, SVG-PDA. Echo (3/12): EF 60-65%, mild MR. // Nuc Stress Test 7/19:  EF 50, artifact, no ischemia, low risk    Carotid artery disease (HCC) 01/31/2017   dopplers 1/14:  0-39% bilat   Carotid stenosis    dopplers 1/14:  0-39% bilat   Cataract    REMOVED   Colon polyps 2006   Colonoscopy-Dr. Patterson(Tubular Adenoma)    Coronary Artery Disease 06/03/2008   Annotation: S/P CABG 2009 Qualifier: Diagnosis of  By: Juanda Chance, MD, Johny Chess    DM2 (diabetes mellitus, type 2) (HCC)    Followed by Dr. Chestine Spore   Essential hypertension 06/03/2008   Qualifier: Diagnosis of  By: Juanda Chance, MD, Johny Chess    History of echocardiogram    Echo 7/19: Mild LVH, EF 55-60, normal diastolic function, trivial AI,  moderate LAE   HLD (hyperlipidemia)    HTN (hypertension)    Hx of cardiovascular stress test    ETT-Myoview (03/2013):  Anteroseptal reversible defect, EF 54%, intermediate risk.   Hyperlipidemia 06/03/2008   Qualifier: Diagnosis of  By: Juanda Chance, MD, Johny Chess    Ischemic stroke (HCC) 01/30/2019   OSA (obstructive sleep apnea) 08/25/2011   PAD (peripheral artery disease) (HCC) 12/10/2017   Postoperative atrial fibrillation (HCC) 2009   Renal insufficiency 01/31/2019   Stroke [facility])    Type 2 diabetes mellitus with complication, with long-term current use of insulin (HCC) 06/03/2008   Qualifier: Diagnosis of  By: Juanda Chance, MD, Johny Chess    Ulcer    left foot   Varicose veins of lower extremities with other complications 12/13/2013    Past Surgical History   Past Surgical History:  Procedure Laterality Date   APPENDECTOMY  2005   BACK SURGERY  2006   CARDIAC CATHETERIZATION  2009   EF 60%   COLONOSCOPY     CORONARY ARTERY BYPASS GRAFT  2009   post op. a fib, LIMA -LAD, SVG-D,seq SVG-OM and PLV,  SVG-PDA.   EYE SURGERY     LEFT HEART CATH AND CORS/GRAFTS ANGIOGRAPHY N/A 10/04/2019   Procedure: LEFT HEART CATH AND CORS/GRAFTS ANGIOGRAPHY;  Surgeon: Tonny Bollman, MD;  Location[facility] INVASIVE CV LAB;  Service: Cardiovascular;  Laterality: N/A;   RIGHT/LEFT HEART CATH AND CORONARY/GRAFT ANGIOGRAPHY N/A 08/06/2021   Procedure: RIGHT/LEFT HEART CATH AND CORONARY/GRAFT ANGIOGRAPHY;  Surgeon: Tonny Bollman, MD;  Location: [facility] INVASIVE CV LAB;  Service: Cardiovascular;  Laterality: N/A;   SPINE SURGERY     TONSILLECTOMY      Past Family History   Family History  Problem Relation Age of Onset   Coronary artery disease Other        family history   Heart disease Other        family history   Other Brother        CABG   Other Brother        CABG   Prostate cancer Father    Diabetes Father    Heart disease Father    Hyperlipidemia Father    Hypertension Father     Prostate cancer Brother    Diabetes Brother        x 2   Colon cancer Mother    Cancer Mother    Diabetes Mother    Heart disease Mother    Hyperlipidemia Mother     Past Social History   Social History   Socioeconomic History   Marital status: Married  Spouse name: Brydon Spahr   Number of children: 0   Years of education: 12   Highest education level: 12th grade  Occupational History   Occupation: Facilities manager    Comment: Owner  Tobacco Use   Smoking status: Never    Passive exposure: Never   Smokeless tobacco: Former    Types: Chew    Quit date: 04/06/1993  Vaping Use   Vaping status: Never Used  Substance and Sexual Activity   Alcohol use: No   Drug use: No   Sexual activity: Not Currently    Partners: Female  Other Topics Concern   Not on file  Social History Narrative   Owns Pharmacist, 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   Originally from [place].   Social Determinants of Health   Financial Resource Strain: Low Risk  (01/01/2022)   Overall Financial Resource Strain (CARDIA)    Difficulty of Paying Living Expenses: Not hard at all  Food Insecurity: No Food Insecurity (01/01/2022)   Hunger Vital Sign    Worried About Running Out of Food in the Last Year: Never true    Ran Out of Food in the Last Year: Never true  Transportation Needs: No Transportation Needs (01/01/2022)   PRAPARE - Administrator, Civil Service (Medical): No    Lack of Transportation (Non-Medical): No  Physical Activity: Sufficiently Active (01/01/2022)   Exercise Vital Sign    Days of Exercise per Week: 5 days    Minutes of Exercise per Session: 150+ min  Stress: No Stress Concern Present (01/01/2022)   [employer] of Occupational Health - Occupational Stress Questionnaire    Feeling of Stress : Not at all  Social Connections: Socially Integrated (01/01/2022)   Social Connection and Isolation Panel [NHANES]    Frequency of Communication with  Friends and Family: More than three times a week    Frequency of Social Gatherings with Friends and Family: More than three times a week    Attends Religious Services: More than 4 times per year    Active Member of [employer] or Organizations: Yes    Attends Engineer, structural: More than 4 times per year    Marital Status: Married  Catering manager Violence: Not At Risk (01/01/2022)   Humiliation, Afraid, Rape, and Kick questionnaire    Fear of Current or Ex-Partner: No    Emotionally Abused: No    Physically Abused: No    Sexually Abused: No    Review of Systems   General: Negative for anorexia, weight loss, fever, chills, fatigue, weakness. Eyes: Negative for vision changes.  ENT: Negative for hoarseness, difficulty swallowing , nasal congestion. CV: Negative for chest pain, angina, palpitations, dyspnea on exertion, peripheral edema.  Respiratory: Negative for dyspnea at rest, dyspnea on exertion, cough, sputum, wheezing.  GI: See history of present illness. GU:  Negative for dysuria, hematuria, urinary incontinence, urinary frequency, nocturnal urination.  MS: Negative for joint pain, low back pain.  Derm: Negative for rash or itching.  Neuro: Negative for weakness, abnormal sensation, seizure, frequent headaches, memory loss,  confusion.  Psych: Negative for anxiety, depression, suicidal ideation, hallucinations.  Endo: Negative for unusual weight change.  Heme: Negative for bruising or bleeding. Allergy: Negative for rash or hives.  Physical Exam   There were no vitals taken for this visit.   General: Well-nourished, well-developed in no acute distress.  Head: Normocephalic, atraumatic.   Eyes: Conjunctiva pink, no icterus. Mouth: Oropharyngeal mucosa moist and  pink , no lesions erythema or exudate. Neck: Supple without thyromegaly, masses, or lymphadenopathy.  Lungs: Clear to auscultation bilaterally.  Heart: Regular rate and rhythm, no murmurs rubs or gallops.   Abdomen: Bowel sounds are normal, nontender, nondistended, no hepatosplenomegaly or masses,  no abdominal bruits or hernia, no rebound or guarding.   Rectal: *** Extremities: No lower extremity edema. No clubbing or deformities.  Neuro: Alert and oriented x 4 , grossly normal neurologically.  Skin: Warm and dry, no rash or jaundice.   Psych: Alert and cooperative, normal mood and affect.  Labs   *** Imaging Studies   No results found.  Assessment       PLAN   ***   Leanna Battles. Melvyn Neth, MHS, PA-C [facility]

## 2023-01-18 ENCOUNTER — Ambulatory Visit (INDEPENDENT_AMBULATORY_CARE_PROVIDER_SITE_OTHER): Payer: No Typology Code available for payment source | Admitting: Gastroenterology

## 2023-01-18 ENCOUNTER — Encounter: Payer: Self-pay | Admitting: *Deleted

## 2023-01-18 ENCOUNTER — Encounter: Payer: Self-pay | Admitting: Gastroenterology

## 2023-01-18 ENCOUNTER — Other Ambulatory Visit: Payer: Self-pay | Admitting: *Deleted

## 2023-01-18 VITALS — BP 112/60 | HR 60 | Temp 98.7°F | Ht 70.0 in | Wt 242.8 lb

## 2023-01-18 DIAGNOSIS — Z860101 Personal history of adenomatous and serrated colon polyps: Secondary | ICD-10-CM | POA: Diagnosis not present

## 2023-01-18 DIAGNOSIS — Z800 Family history of malignant neoplasm of digestive organs: Secondary | ICD-10-CM | POA: Insufficient documentation

## 2023-01-18 DIAGNOSIS — Z09 Encounter for follow-up examination after completed treatment for conditions other than malignant neoplasm: Secondary | ICD-10-CM | POA: Diagnosis not present

## 2023-01-18 MED ORDER — PEG 3350-KCL-NA BICARB-NACL 420 G PO SOLR: 4000.0000 mL | Freq: Once | ORAL | 0 refills | Status: AC

## 2023-01-18 NOTE — Patient Instructions (Signed)
Colonoscopy to be scheduled.

## 2023-02-03 ENCOUNTER — Other Ambulatory Visit: Payer: Self-pay | Admitting: Physician Assistant

## 2023-02-22 NOTE — Patient Instructions (Signed)
[name]  02/22/2023     @PREFPERIOPPHARMACY @   Your procedure is scheduled on 02/26/2023.  Report to [facility] at 8:00 A.M.  Call this number if you have problems the morning of surgery:  [phone]  If you experience any cold or flu symptoms such as cough, fever, chills, shortness of breath, etc. between now and your scheduled surgery, please notify us at the above number.   Remember:   Please follow the diet and prep instructions given to you by Dr Luvenia Starch office.     Take these medicines the morning of surgery with A SIP OF WATER : Norvasc Imdur and Metoprolol    Last Ozempic dose should be on 02/18/2023 or before.   No diabetic medications the am of the procedure.   Take only 1/2 of your insulin the night before surgery.    Do not wear jewelry, make-up or nail polish, including gel polish,  artificial nails, or any other type of covering on natural nails (fingers and  toes).  Do not wear lotions, powders, or perfumes, or deodorant.  Do not shave 48 hours prior to surgery.  Men may shave face and neck.  Do not bring valuables to the hospital.  [facility] is not responsible for any belongings or valuables.  Contacts, dentures or bridgework may not be worn into surgery.  Leave your suitcase in the car.  After surgery it may be brought to your room.  For patients admitted to the hospital, discharge time will be determined by your treatment team.  Patients discharged the day of surgery will not be allowed to drive home.   Name and phone number of your driver:   Family Special instructions:  N/A  Please read over the following fact sheets that you were given. Care and Recovery After Surgery   Colonoscopy, Adult A colonoscopy is a procedure to look at the entire large intestine. This procedure is done using a long, thin, flexible tube that has a camera on the end. You may have a colonoscopy: As a part of normal colorectal screening. If you have  certain symptoms, such as: A low number of red blood cells in your blood (anemia). Diarrhea that does not go away. Pain in your abdomen. Blood in your stool. A colonoscopy can help screen for and diagnose medical problems, including: An abnormal growth of cells or tissue (tumor). Abnormal growths within the lining of your intestine (polyps). Inflammation. Areas of bleeding. Tell your health care provider about: Any allergies you have. All medicines you are taking, including vitamins, herbs, eye drops, creams, and over-the-counter medicines. Any problems you or family members have had with anesthetic medicines. Any bleeding problems you have. Any surgeries you have had. Any medical conditions you have. Any problems you have had with having bowel movements. Whether you are pregnant or may be pregnant. What are the risks? Generally, this is a safe procedure. However, problems may occur, including: Bleeding. Damage to your intestine. Allergic reactions to medicines given during the procedure. Infection. This is rare. What happens before the procedure? Eating and drinking restrictions Follow instructions from your health care provider about eating or drinking restrictions, which may include: A few days before the procedure: Follow a low-fiber diet. Avoid nuts, seeds, dried fruit, raw fruits, and vegetables. 1-3 days before the procedure: Eat only gelatin dessert or ice pops. Drink only clear liquids, such as water, clear juice, clear broth or bouillon, black coffee or tea, or clear soft drinks or sports  drinks. Avoid liquids that contain red or purple dye. The day of the procedure: Do not eat solid foods. You may continue to drink clear liquids until up to 2 hours before the procedure. Do not eat or drink anything starting 2 hours before the procedure, or within the time period that your health care provider recommends. Bowel prep If you were prescribed a bowel prep to take by mouth  (orally) to clean out your colon: Take it as told by your health care provider. Starting the day before your procedure, you will need to drink a large amount of liquid medicine. The liquid will cause you to have many bowel movements of loose stool until your stool becomes almost clear or light green. If your skin or the opening between the buttocks (anus) gets irritated from diarrhea, you may relieve the irritation using: Wipes with medicine in them, such as adult wet wipes with aloe and vitamin E. A product to soothe skin, such as petroleum jelly. If you vomit while drinking the bowel prep: Take a break for up to 60 minutes. Begin the bowel prep again. Call your health care provider if you keep vomiting or you cannot take the bowel prep without vomiting. To clean out your colon, you may also be given: Laxative medicines. These help you have a bowel movement. Instructions for enema use. An enema is liquid medicine injected into your rectum. Medicines Ask your health care provider about: Changing or stopping your regular medicines or supplements. This is especially important if you are taking iron supplements, diabetes medicines, or blood thinners. Taking medicines such as aspirin and ibuprofen. These medicines can thin your blood. Do not take these medicines unless your health care provider tells you to take them. Taking over-the-counter medicines, vitamins, herbs, and supplements. General instructions Ask your health care provider what steps will be taken to help prevent infection. These may include washing skin with a germ-killing soap. If you will be going home right after the procedure, plan to have a responsible adult: Take you home from the hospital or clinic. You will not be allowed to drive. Care for you for the time you are told. What happens during the procedure?  An IV will be inserted into one of your veins. You will be given a medicine to make you fall asleep (general  anesthetic). You will lie on your side with your knees bent. A lubricant will be put on the tube. Then the tube will be: Inserted into your anus. Gently eased through all parts of your large intestine. Air will be sent into your colon to keep it open. This may cause some pressure or cramping. Images will be taken with the camera and will appear on a screen. A small tissue sample may be removed to be looked at under a microscope (biopsy). The tissue may be sent to a lab for testing if any signs of problems are found. If small polyps are found, they may be removed and checked for cancer cells. When the procedure is finished, the tube will be removed. The procedure may vary among health care providers and hospitals. What happens after the procedure? Your blood pressure, heart rate, breathing rate, and blood oxygen level will be monitored until you leave the hospital or clinic. You may have a small amount of blood in your stool. You may pass gas and have mild cramping or bloating in your abdomen. This is caused by the air that was used to open your colon during the exam. If  you were given a sedative during the procedure, it can affect you for several hours. Do not drive or operate machinery until your health care provider says that it is safe. It is up to you to get the results of your procedure. Ask your health care provider, or the department that is doing the procedure, when your results will be ready. Summary A colonoscopy is a procedure to look at the entire large intestine. Follow instructions from your health care provider about eating and drinking before the procedure. If you were prescribed an oral bowel prep to clean out your colon, take it as told by your health care provider. During the colonoscopy, a flexible tube with a camera on its end is inserted into the anus and then passed into all parts of the large intestine. This information is not intended to replace advice given to you by  your health care provider. Make sure you discuss any questions you have with your health care provider. Document Revised: 05/05/2022 Document Reviewed: 11/13/2020 Elsevier Patient Education  2024 Elsevier Inc.  Monitored Anesthesia Care Anesthesia refers to the techniques, procedures, and medicines that help a person stay safe and comfortable during surgery. Monitored anesthesia care, or sedation, is one type of anesthesia. You may have sedation if you do not need to be asleep for your procedure. Procedures that use sedation may include: Surgery to remove cataracts from your eyes. A dental procedure. A biopsy. This is when a tissue sample is removed and looked at under a microscope. You will be watched closely during your procedure. Your level of sedation or type of anesthesia may be changed to fit your needs. Tell a health care provider about: Any allergies you have. All medicines you are taking, including vitamins, herbs, eye drops, creams, and over-the-counter medicines. Any problems you or family members have had with anesthesia. Any bleeding problems you have. Any surgeries you have had. Any medical conditions or illnesses you have. This includes sleep apnea, cough, fever, or the flu. Whether you are pregnant or may be pregnant. Whether you use cigarettes, alcohol, or drugs. Any use of steroids, whether by mouth or as a cream. What are the risks? Your health care provider will talk with you about risks. These may include: Getting too much medicine (oversedation). Nausea. Allergic reactions to medicines. Trouble breathing. If this happens, a breathing tube may be used to help you breathe. It will be removed when you are awake and breathing on your own. Heart trouble. Lung trouble. Confusion that gets better with time (emergence delirium). What happens before the procedure? When to stop eating and drinking Follow instructions from your health care provider about what you may eat  and drink. These may include: 8 hours before your procedure Stop eating most foods. Do not eat meat, fried foods, or fatty foods. Eat only light foods, such as toast or crackers. All liquids are okay except energy drinks and alcohol. 6 hours before your procedure Stop eating. Drink only clear liquids, such as water, clear fruit juice, black coffee, plain tea, and sports drinks. Do not drink energy drinks or alcohol. 2 hours before your procedure Stop drinking all liquids. You may be allowed to take medicines with small sips of water. If you do not follow your health care provider's instructions, your procedure may be delayed or canceled. Medicines Ask your health care provider about: Changing or stopping your regular medicines. These include any diabetes medicines or blood thinners you take. Taking medicines such as aspirin and ibuprofen.  These medicines can thin your blood. Do not take them unless your health care provider tells you to. Taking over-the-counter medicines, vitamins, herbs, and supplements. Testing You may have an exam or testing. You may have a blood or urine sample taken. General instructions Do not use any products that contain nicotine or tobacco for at least 4 weeks before the procedure. These products include cigarettes, chewing tobacco, and vaping devices, such as e-cigarettes. If you need help quitting, ask your health care provider. If you will be going home right after the procedure, plan to have a responsible adult: Take you home from the hospital or clinic. You will not be allowed to drive. Care for you for the time you are told. What happens during the procedure?  Your blood pressure, heart rate, breathing, level of pain, and blood oxygen level will be monitored. An IV will be inserted into one of your veins. You may be given: A sedative. This helps you relax. Anesthesia. This will: Numb certain areas of your body. Make you fall asleep for surgery. You  will be given medicines as needed to keep you comfortable. The more medicine you are given, the deeper your level of sedation will be. Your level of sedation may be changed to fit your needs. There are three levels of sedation: Mild sedation. At this level, you may feel awake and relaxed. You will be able to follow directions. Moderate sedation. At this level, you will be sleepy. You may not remember the procedure. Deep sedation. At this level, you will be asleep. You will not remember the procedure. How you get the medicines will depend on your age and the procedure. They may be given as: A pill. This may be taken by mouth (orally) or inserted into the rectum. An injection. This may be into a vein or muscle. A spray through the nose. After your procedure is over, the medicine will be stopped. The procedure may vary among health care providers and hospitals. What happens after the procedure? Your blood pressure, heart rate, breathing rate, and blood oxygen level will be monitored until you leave the hospital or clinic. You may feel sleepy, clumsy, or nauseous. You may not remember what happened during or after the procedure. Sedation can affect you for several hours. Do not drive or use machinery until your health care provider says that it is safe. This information is not intended to replace advice given to you by your health care provider. Make sure you discuss any questions you have with your health care provider. Document Revised: 08/18/2021 Document Reviewed: 08/18/2021 Elsevier Patient Education  2024 [employer].

## 2023-02-23 ENCOUNTER — Encounter (HOSPITAL_COMMUNITY): Payer: Self-pay

## 2023-02-23 ENCOUNTER — Encounter (HOSPITAL_COMMUNITY)
Admission: RE | Admit: 2023-02-23 | Discharge: 2023-02-23 | Disposition: A | Discharge status: Home / Self Care | Payer: No Typology Code available for payment source | Source: Ambulatory Visit | Attending: Internal Medicine | Admitting: Internal Medicine

## 2023-02-23 VITALS — BP 125/60 | HR 69 | Temp 97.8°F | Resp 18 | Ht 70.0 in | Wt 242.7 lb

## 2023-02-23 DIAGNOSIS — E1159 Type 2 diabetes mellitus with other circulatory complications: Secondary | ICD-10-CM | POA: Insufficient documentation

## 2023-02-23 DIAGNOSIS — Z01818 Encounter for other preprocedural examination: Secondary | ICD-10-CM | POA: Diagnosis not present

## 2023-02-23 DIAGNOSIS — I10 Essential (primary) hypertension: Secondary | ICD-10-CM | POA: Insufficient documentation

## 2023-02-23 LAB — BASIC METABOLIC PANEL
Anion gap: 12 (ref 5–15)
BUN: 22 mg/dL (ref 8–23)
CO2: 23 mmol/L (ref 22–32)
Calcium: 9.2 mg/dL (ref 8.9–10.3)
Chloride: 104 mmol/L (ref 98–111)
Creatinine, Ser: 1.48 mg/dL — ABNORMAL HIGH (ref 0.61–1.24)
GFR, Estimated: 51 mL/min — ABNORMAL LOW (ref >60)
Glucose, Bld: 177 mg/dL — ABNORMAL HIGH (ref 70–99)
Potassium: 3.7 mmol/L (ref 3.5–5.1)
Sodium: 139 mmol/L (ref 135–145)

## 2023-02-24 DIAGNOSIS — H52223 Regular astigmatism, bilateral: Secondary | ICD-10-CM | POA: Diagnosis not present

## 2023-02-24 DIAGNOSIS — H524 Presbyopia: Secondary | ICD-10-CM | POA: Diagnosis not present

## 2023-02-26 ENCOUNTER — Other Ambulatory Visit: Payer: Self-pay

## 2023-02-26 ENCOUNTER — Ambulatory Visit (HOSPITAL_COMMUNITY): Payer: No Typology Code available for payment source | Admitting: Anesthesiology

## 2023-02-26 ENCOUNTER — Ambulatory Visit (HOSPITAL_COMMUNITY)
Admission: RE | Admit: 2023-02-26 | Discharge: 2023-02-26 | Disposition: A | Discharge status: Home / Self Care | Payer: No Typology Code available for payment source | Attending: Internal Medicine | Admitting: Internal Medicine

## 2023-02-26 ENCOUNTER — Encounter (HOSPITAL_COMMUNITY): Payer: Self-pay | Admitting: Internal Medicine

## 2023-02-26 ENCOUNTER — Encounter (HOSPITAL_COMMUNITY)
Admission: RE | Disposition: A | Discharge status: Home / Self Care | Payer: Self-pay | Source: Home / Self Care | Attending: Internal Medicine

## 2023-02-26 DIAGNOSIS — Z8673 Personal history of transient ischemic attack (TIA), and cerebral infarction without residual deficits: Secondary | ICD-10-CM | POA: Diagnosis not present

## 2023-02-26 DIAGNOSIS — Z800 Family history of malignant neoplasm of digestive organs: Secondary | ICD-10-CM | POA: Insufficient documentation

## 2023-02-26 DIAGNOSIS — I110 Hypertensive heart disease with heart failure: Secondary | ICD-10-CM | POA: Insufficient documentation

## 2023-02-26 DIAGNOSIS — I25119 Atherosclerotic heart disease of native coronary artery with unspecified angina pectoris: Secondary | ICD-10-CM | POA: Insufficient documentation

## 2023-02-26 DIAGNOSIS — G4733 Obstructive sleep apnea (adult) (pediatric): Secondary | ICD-10-CM | POA: Diagnosis not present

## 2023-02-26 DIAGNOSIS — Z1211 Encounter for screening for malignant neoplasm of colon: Secondary | ICD-10-CM

## 2023-02-26 DIAGNOSIS — Z7984 Long term (current) use of oral hypoglycemic drugs: Secondary | ICD-10-CM | POA: Diagnosis not present

## 2023-02-26 DIAGNOSIS — Z860101 Personal history of adenomatous and serrated colon polyps: Secondary | ICD-10-CM | POA: Diagnosis not present

## 2023-02-26 DIAGNOSIS — Z860100 Personal history of colon polyps, unspecified: Secondary | ICD-10-CM | POA: Diagnosis not present

## 2023-02-26 DIAGNOSIS — D124 Benign neoplasm of descending colon: Secondary | ICD-10-CM | POA: Diagnosis not present

## 2023-02-26 DIAGNOSIS — I5032 Chronic diastolic (congestive) heart failure: Secondary | ICD-10-CM | POA: Insufficient documentation

## 2023-02-26 DIAGNOSIS — E1159 Type 2 diabetes mellitus with other circulatory complications: Secondary | ICD-10-CM

## 2023-02-26 DIAGNOSIS — G4730 Sleep apnea, unspecified: Secondary | ICD-10-CM | POA: Diagnosis not present

## 2023-02-26 DIAGNOSIS — K5730 Diverticulosis of large intestine without perforation or abscess without bleeding: Secondary | ICD-10-CM | POA: Diagnosis not present

## 2023-02-26 DIAGNOSIS — E1151 Type 2 diabetes mellitus with diabetic peripheral angiopathy without gangrene: Secondary | ICD-10-CM | POA: Diagnosis not present

## 2023-02-26 DIAGNOSIS — K635 Polyp of colon: Secondary | ICD-10-CM | POA: Diagnosis not present

## 2023-02-26 DIAGNOSIS — Z794 Long term (current) use of insulin: Secondary | ICD-10-CM | POA: Insufficient documentation

## 2023-02-26 DIAGNOSIS — Z7985 Long-term (current) use of injectable non-insulin antidiabetic drugs: Secondary | ICD-10-CM | POA: Insufficient documentation

## 2023-02-26 HISTORY — PX: COLONOSCOPY WITH PROPOFOL: SHX5780

## 2023-02-26 HISTORY — PX: POLYPECTOMY: SHX5525

## 2023-02-26 LAB — GLUCOSE, CAPILLARY: Glucose-Capillary: 87 mg/dL (ref 70–99)

## 2023-02-26 SURGERY — COLONOSCOPY WITH PROPOFOL
Anesthesia: General

## 2023-02-26 MED ORDER — PROPOFOL 500 MG/50ML IV EMUL
INTRAVENOUS | Status: AC
  Filled 2023-02-26: qty 50

## 2023-02-26 MED ORDER — LACTATED RINGERS IV SOLN: INTRAVENOUS | Status: DC

## 2023-02-26 MED ORDER — PROPOFOL 500 MG/50ML IV EMUL
INTRAVENOUS | Status: DC | PRN
  Administered 2023-02-26: 60 mg via INTRAVENOUS
  Administered 2023-02-26: 150 ug/kg/min via INTRAVENOUS

## 2023-02-26 NOTE — H&P (Signed)
@LOGO @   Primary Care Physician:  Lupita Raider, NP Primary Gastroenterologist:  Dr. Jena Gauss  Pre-Procedure History & Physical: HPI:  [name] is a 72 y.o. male here for  surveillance colonoscopy.  Distant history colonic adenoma positive family colon cancer.  Somewhat overdue for surveillance exam.  Past Medical History:  Diagnosis Date   (HFpEF) heart failure with preserved ejection fraction (HCC) 08/11/2021   Echo 08/2021: EF 50-55, no RWMA, GR 2 DD, mildly reduced RVSF, normal PASP, RVSP 29.8, mild LAE, trivial MR, trivial AI, mild dilation of ascending aorta (41 mm)   Allergy    Ascending aorta dilation (HCC) 08/11/2021   Echo 08/2021: EF 50-55, no RWMA, GR 2 DD, mildly reduced RVSF, normal PASP, RVSP 29.8, mild LAE, trivial MR, trivial AI, mild dilation of ascending aorta (41 mm)   CAD (coronary artery disease), native coronary artery    Multiple PCI procedures followed by CABG in 2009 with LIMA-LAD, SVG-D, seq SVG-OM and PLV, SVG-PDA. Echo (3/12): EF 60-65%, mild MR. // Nuc Stress Test 7/19:  EF 50, artifact, no ischemia, low risk    Carotid artery disease (HCC) 01/31/2017   dopplers 1/14:  0-39% bilat   Carotid stenosis    dopplers 1/14:  0-39% bilat   Cataract    REMOVED   Colon polyps 2006   Colonoscopy-Dr. Patterson(Tubular Adenoma)    Coronary Artery Disease 06/03/2008   Annotation: S/P CABG 2009 Qualifier: Diagnosis of  By: Juanda Chance, MD, Johny Chess    DM2 (diabetes mellitus, type 2) (HCC)    Followed by Dr. Chestine Spore   Essential hypertension 06/03/2008   Qualifier: Diagnosis of  By: Juanda Chance, MD, Johny Chess    History of echocardiogram    Echo 7/19: Mild LVH, EF 55-60, normal diastolic function, trivial AI, moderate LAE   HLD (hyperlipidemia)    HTN (hypertension)    Hx of cardiovascular stress test    ETT-Myoview (03/2013):  Anteroseptal reversible defect, EF 54%, intermediate risk.   Hyperlipidemia 06/03/2008   Qualifier: Diagnosis of  By: Juanda Chance, MD,  Johny Chess    Ischemic stroke (HCC) 01/30/2019   OSA (obstructive sleep apnea) 08/25/2011   PAD (peripheral artery disease) (HCC) 12/10/2017   Postoperative atrial fibrillation (HCC) 2009   Renal insufficiency 01/31/2019   Stroke [facility])    Type 2 diabetes mellitus with complication, with long-term current use of insulin (HCC) 06/03/2008   Qualifier: Diagnosis of  By: Juanda Chance, MD, Johny Chess    Ulcer    left foot   Varicose veins of lower extremities with other complications 12/13/2013    Past Surgical History:  Procedure Laterality Date   APPENDECTOMY  2005   BACK SURGERY  2006   CARDIAC CATHETERIZATION  2009   EF 60%   COLONOSCOPY     CORONARY ARTERY BYPASS GRAFT  2009   post op. a fib, LIMA -LAD, SVG-D,seq SVG-OM and PLV,  SVG-PDA.   EYE SURGERY     LEFT HEART CATH AND CORS/GRAFTS ANGIOGRAPHY N/A 10/04/2019   Procedure: LEFT HEART CATH AND CORS/GRAFTS ANGIOGRAPHY;  Surgeon: Tonny Bollman, MD;  Location: [facility] INVASIVE CV LAB;  Service: Cardiovascular;  Laterality: N/A;   RIGHT/LEFT HEART CATH AND CORONARY/GRAFT ANGIOGRAPHY N/A 08/06/2021   Procedure: RIGHT/LEFT HEART CATH AND CORONARY/GRAFT ANGIOGRAPHY;  Surgeon: Tonny Bollman, MD;  Location[facility] INVASIVE CV LAB;  Service: Cardiovascular;  Laterality: N/A;   SPINE SURGERY     TONSILLECTOMY      Prior to Admission medications  Medication Sig Start Date End Date Taking? Authorizing Provider  amitriptyline (ELAVIL) 75 MG tablet Take 75 mg by mouth at bedtime.   Yes [provider]  amLODipine (NORVASC) 10 MG tablet TAKE 1 TABLET BY MOUTH EVERY DAY 04/08/22  Yes Tonny Bollman, MD  ezetimibe (ZETIA) 10 MG tablet Take 1 tablet (10 mg total) by mouth daily. 04/23/22  Yes Tonny Bollman, MD  fenofibrate 54 MG tablet TAKE 1 TABLET BY MOUTH EVERY DAY 04/08/22  Yes Tonny Bollman, MD  isosorbide mononitrate (IMDUR) 60 MG 24 hr tablet TAKE 1 TABLET BY MOUTH EVERY DAY 02/03/23  Yes Weaver, Scott T, PA-C  metFORMIN  (GLUCOPHAGE-XR) 500 MG 24 hr tablet Take 500 mg by mouth 2 (two) times daily.   Yes [provider]  metoprolol tartrate (LOPRESSOR) 100 MG tablet Take 1 tablet (100 mg total) by mouth 2 (two) times daily. 11/23/22  Yes Weaver, Scott T, PA-C  NOVOLOG MIX 70/30 FLEXPEN (70-30) 100 UNIT/ML Pen Inject 40-60 Units into the skin 3 (three) times daily. Per sliding scale 07/10/13  Yes [provider]  rosuvastatin (CRESTOR) 5 MG tablet TAKE 1 TABLET (5 MG TOTAL) BY MOUTH DAILY. 09/10/22  Yes Tonny Bollman, MD  acetaminophen (TYLENOL) 500 MG tablet Take 500 mg by mouth every 6 (six) hours as needed for moderate pain.    [provider]  aspirin 81 MG EC tablet Take 1 tablet (81 mg total) by mouth daily. 12/20/20 01/18/23  Tereso Newcomer T, PA-C  clopidogrel (PLAVIX) 75 MG tablet TAKE 1 TABLET BY MOUTH EVERY DAY 04/08/22   Tonny Bollman, MD  Continuous Glucose Sensor (FREESTYLE LIBRE 2 SENSOR) MISC  01/12/23   [provider]  furosemide (LASIX) 40 MG tablet TAKE 1 TABLET BY MOUTH TWICE A DAY FOR 3 DAYS THEN REDUCE DOWN TO 1 TABLET BY MOUTH DAILY 09/01/22   Tonny Bollman, MD  glucose monitoring kit (FREESTYLE) monitoring kit 1 each by Does not apply route as needed for other. Freestyle Libre    [provider]  nitroGLYCERIN (NITROSTAT) 0.4 MG SL tablet PLACE 1 TABLET UNDER THE TONGUE EVERY 5 MINUTES AS NEEDED FOR CHEST PAIN 12/08/22   Tereso Newcomer T, PA-C  NON FORMULARY Inject 30 mLs into the skin every 30 (thirty) days. zilpibekimab    [provider]  Semaglutide, 1 MG/DOSE, (OZEMPIC, 1 MG/DOSE,) 2 MG/1.5ML SOPN Inject 2 mg into the skin every Wednesday.    [provider]    Allergies as of 01/18/2023 - Review Complete 01/18/2023  Allergen Reaction Noted   Penicillins Other (See Comments) 08/24/2011    Family History  Problem Relation Age of Onset   Colon cancer Mother        mid-60s   Cancer Mother    Diabetes Mother    Heart disease  Mother    Hyperlipidemia Mother    Prostate cancer Father    Diabetes Father    Heart disease Father    Hyperlipidemia Father    Hypertension Father    Other Brother        CABG   Other Brother        CABG   Prostate cancer Brother    Diabetes Brother        x 2   Liver cancer Brother        inoperable, near bile duct/pancreas   Coronary artery disease Other        family history   Heart disease Other  family history    Social History   Socioeconomic History   Marital status: Married    Spouse name: Shirley Dudoit   Number of children: 0   Years of education: 12   Highest education level: 12th grade  Occupational History   Occupation: Facilities manager    Comment: Owner  Tobacco Use   Smoking status: Never    Passive exposure: Never   Smokeless tobacco: Former    Types: Chew    Quit date: 04/06/1993  Vaping Use   Vaping status: Never Used  Substance and Sexual Activity   Alcohol use: No   Drug use: No   Sexual activity: Not Currently    Partners: Female  Other Topics Concern   Not on file  Social History Narrative   Owns Pharmacist, 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   Originally from [place].   Social Determinants of Health   Financial Resource Strain: Low Risk  (01/01/2022)   Overall Financial Resource Strain (CARDIA)    Difficulty of Paying Living Expenses: Not hard at all  Food Insecurity: No Food Insecurity (01/01/2022)   Hunger Vital Sign    Worried About Running Out of Food in the Last Year: Never true    Ran Out of Food in the Last Year: Never true  Transportation Needs: No Transportation Needs (01/01/2022)   PRAPARE - Administrator, Civil Service (Medical): No    Lack of Transportation (Non-Medical): No  Physical Activity: Sufficiently Active (01/01/2022)   Exercise Vital Sign    Days of Exercise per Week: 5 days    Minutes of Exercise per Session: 150+ min  Stress: No Stress Concern Present (01/01/2022)    [employer] of Occupational Health - Occupational Stress Questionnaire    Feeling of Stress : Not at all  Social Connections: Socially Integrated (01/01/2022)   Social Connection and Isolation Panel [NHANES]    Frequency of Communication with Friends and Family: More than three times a week    Frequency of Social Gatherings with Friends and Family: More than three times a week    Attends Religious Services: More than 4 times per year    Active Member of [employer] or Organizations: Yes    Attends Engineer, structural: More than 4 times per year    Marital Status: Married  Catering manager Violence: Not At Risk (01/01/2022)   Humiliation, Afraid, Rape, and Kick questionnaire    Fear of Current or Ex-Partner: No    Emotionally Abused: No    Physically Abused: No    Sexually Abused: No    Review of Systems: See HPI, otherwise negative ROS  Physical Exam: BP 136/76   Pulse 64   Resp 16   Ht 5\' 10"  (1.778 m)   Wt 110.1 kg   SpO2 97%   BMI 34.83 kg/m  General:   Alert,  Well-developed, well-nourished, pleasant and cooperative in NAD Skin:  Intact without significant lesions or rashes. Eyes:  Sclera clear, no icterus.   Conjunctiva pink. Ears:  Normal auditory acuity. Nose:  No deformity, discharge,  or lesions. Mouth:  No deformity or lesions. Neck:  Supple; no masses or thyromegaly. No significant cervical adenopathy. Lungs:  Clear throughout to auscultation.   No wheezes, crackles, or rhonchi. No acute distress. Heart:  Regular rate and rhythm; no murmurs, clicks, rubs,  or gallops. Abdomen: Non-distended, normal bowel sounds.  Soft and nontender without appreciable mass or hepatosplenomegaly.  Pulses:  Normal pulses noted.  Extremities:  Without clubbing or edema.  Impression/Plan:   [name] is a 72 y.o. male here for  surveillance colonoscopy.  Distant history colonic adenoma positive family colon cancer.  Somewhat overdue for surveillance exam.     Recommendations:  I will offer the patient a surveillance colonoscopy.The risks, benefits, limitations, alternatives and imponderables have been reviewed with the patient. Questions have been answered. All parties are agreeable.       Notice: This dictation was prepared with Dragon dictation along with smaller phrase technology. Any transcriptional errors that result from this process are unintentional and may not be corrected upon review.

## 2023-02-26 NOTE — Op Note (Signed)
[facility] Patient Name: [name] Procedure Date: 02/26/2023 10:28 AM MRN: [id number] Date of Birth: 1950-08-06 Attending MD: Gennette Pac , MD, [id number] CSN: [id number] Age: 72 Admit Type: Outpatient Procedure:                Colonoscopy Indications:              High risk colon cancer surveillance: Personal                            history of colonic polyps Providers:                Gennette Pac, MD, Sheran Fava, Francoise Ceo RN, RN, Durwin Glaze Tech, Technician Referring MD:              Medicines:                Propofol per Anesthesia Complications:            No immediate complications. Estimated Blood Loss:     Estimated blood loss was minimal. Estimated blood                            loss was minimal. Procedure:                Pre-Anesthesia Assessment:                           - Prior to the procedure, a History and Physical                            was performed, and patient medications and                            allergies were reviewed. The patient's tolerance of                            previous anesthesia was also reviewed. The risks                            and benefits of the procedure and the sedation                            options and risks were discussed with the patient.                            All questions were answered, and informed consent                            was obtained. Prior Anticoagulants: The patient has                            taken no anticoagulant or antiplatelet agents. ASA  Grade Assessment: III - A patient with severe                            systemic disease. After reviewing the risks and                            benefits, the patient was deemed in satisfactory                            condition to undergo the procedure.                           After obtaining informed consent, the colonoscope                             was passed under direct vision. Throughout the                            procedure, the patient's blood pressure, pulse, and                            oxygen saturations were monitored continuously. The                            [id number] scope was introduced through the                            anus and advanced to the the cecum, identified by                            appendiceal orifice and ileocecal valve. The entire                            colon was well visualized. Scope In: 10:52:18 AM Scope Out: 11:09:20 AM Scope Withdrawal Time: 0 hours 8 minutes 20 seconds  Total Procedure Duration: 0 hours 17 minutes 2 seconds  Findings:      The perianal and digital rectal examinations were normal.      Two semi-pedunculated polyps were found in the descending colon and       proximal descending colon. The polyps were 3 to 5 mm in size. These       polyps were removed with a cold snare. Resection and retrieval were       complete. Estimated blood loss was minimal.      The exam was otherwise without abnormality on direct and retroflexion       views.      Scattered large-mouthed diverticula were found in the sigmoid colon and       descending colon. Impression:               - Two 3 to 5 mm polyps in the descending colon and                            in the proximal descending colon, removed with a  cold snare. Resected and retrieved.                           - The examination was otherwise normal on direct                            and retroflexion views. Moderate Sedation:      Moderate (conscious) sedation was personally administered by an       anesthesia professional. The following parameters were monitored: oxygen       saturation, heart rate, blood pressure, respiratory rate, EKG, adequacy       of pulmonary ventilation, and response to care. Recommendation:           - Patient has a contact number available for                             emergencies. The signs and symptoms of potential                            delayed complications were discussed with the                            patient. Return to normal activities tomorrow.                            Written discharge instructions were provided to the                            patient.                           - Advance diet as tolerated.                           - Continue present medications.                           - Repeat colonoscopy date to be determined after                            pending pathology results are reviewed for                            surveillance based on pathology results.                           - Return to GI office (date not yet determined). Procedure Code(s):        --- Professional ---                           [phone], Colonoscopy, flexible; with removal of                            tumor(s), polyp(s), or other lesion(s) by snare  technique Diagnosis Code(s):        --- Professional ---                           Z86.010, Personal history of colonic polyps                           D12.4, Benign neoplasm of descending colon CPT copyright 2022 American Medical Association. All rights reserved. The codes documented in this report are preliminary and upon coder review may  be revised to meet current compliance requirements. Gerrit Friends. Carolyn Sylvia, MD Gennette Pac, MD 02/26/2023 11:14:13 AM This report has been signed electronically. Number of Addenda: 0

## 2023-02-26 NOTE — Discharge Instructions (Signed)
  Colonoscopy Discharge Instructions  Read the instructions outlined below and refer to this sheet in the next few weeks. These discharge instructions provide you with general information on caring for yourself after you leave the hospital. Your doctor may also give you specific instructions. While your treatment has been planned according to the most current medical practices available, unavoidable complications occasionally occur. If you have any problems or questions after discharge, call Dr. Jena Gauss at [phone]. ACTIVITY You may resume your regular activity, but move at a slower pace for the next 24 hours.  Take frequent rest periods for the next 24 hours.  Walking will help get rid of the air and reduce the bloated feeling in your belly (abdomen).  No driving for 24 hours (because of the medicine (anesthesia) used during the test).   Do not sign any important legal documents or operate any machinery for 24 hours (because of the anesthesia used during the test).  NUTRITION Drink plenty of fluids.  You may resume your normal diet as instructed by your doctor.  Begin with a light meal and progress to your normal diet. Heavy or fried foods are harder to digest and may make you feel sick to your stomach (nauseated).  Avoid alcoholic beverages for 24 hours or as instructed.  MEDICATIONS You may resume your normal medications unless your doctor tells you otherwise.  WHAT YOU CAN EXPECT TODAY Some feelings of bloating in the abdomen.  Passage of more gas than usual.  Spotting of blood in your stool or on the toilet paper.  IF YOU HAD POLYPS REMOVED DURING THE COLONOSCOPY: No aspirin products for 7 days or as instructed.  No alcohol for 7 days or as instructed.  Eat a soft diet for the next 24 hours.  FINDING OUT THE RESULTS OF YOUR TEST Not all test results are available during your visit. If your test results are not back during the visit, make an appointment with your caregiver to find out the  results. Do not assume everything is normal if you have not heard from your caregiver or the medical facility. It is important for you to follow up on all of your test results.  SEEK IMMEDIATE MEDICAL ATTENTION IF: You have more than a spotting of blood in your stool.  Your belly is swollen (abdominal distention).  You are nauseated or vomiting.  You have a temperature over 101.  You have abdominal pain or discomfort that is severe or gets worse throughout the day.     2 polyps removed in your colon today.  Colon polyp and diverticulosis information provided  Further recommendations to follow pending review of pathology report  At patient request, I called Mikos Enter at [phone] reviewed findings and recommendations

## 2023-02-26 NOTE — Anesthesia Postprocedure Evaluation (Signed)
Anesthesia Post Note  Patient: [name]  Procedure(s) Performed: COLONOSCOPY WITH PROPOFOL POLYPECTOMY  Patient location during evaluation: Phase II Anesthesia Type: General Level of consciousness: awake Pain management: pain level controlled Vital Signs Assessment: post-procedure vital signs reviewed and stable Respiratory status: spontaneous breathing and respiratory function stable Cardiovascular status: blood pressure returned to baseline and stable Postop Assessment: no headache and no apparent nausea or vomiting Anesthetic complications: no Comments: Late entry   No notable events documented.   Last Vitals:  Vitals:   02/26/23 1117 02/26/23 1127  BP: (!) 97/55 (!) 100/58  Pulse:    Resp:    Temp:    SpO2:      Last Pain:  Vitals:   02/26/23 1114  TempSrc: Axillary  PainSc: 0-No pain                 Windell Norfolk

## 2023-02-26 NOTE — Progress Notes (Signed)
Dr.Rourk aware that patient took plavix last on 02/23/23, he states ok to proceed.

## 2023-02-26 NOTE — Anesthesia Preprocedure Evaluation (Signed)
Anesthesia Evaluation  Patient identified by MRN, date of birth, ID band Patient awake    Reviewed: Allergy & Precautions, H&P , NPO status , Patient's Chart, lab work & pertinent test results, reviewed documented beta blocker date and time   Airway Mallampati: II  TM Distance: >3 FB Neck ROM: full    Dental no notable dental hx.    Pulmonary neg pulmonary ROS, shortness of breath, sleep apnea    Pulmonary exam normal breath sounds clear to auscultation       Cardiovascular Exercise Tolerance: Good hypertension, + angina  + CAD and + Peripheral Vascular Disease  negative cardio ROS  Rhythm:regular Rate:Normal     Neuro/Psych CVA negative neurological ROS  negative psych ROS   GI/Hepatic negative GI ROS, Neg liver ROS,,,  Endo/Other  negative endocrine ROSdiabetes    Renal/GU Renal diseasenegative Renal ROS  negative genitourinary   Musculoskeletal   Abdominal   Peds  Hematology negative hematology ROS (+)   Anesthesia Other Findings   Reproductive/Obstetrics negative OB ROS                             Anesthesia Physical Anesthesia Plan  ASA: 3  Anesthesia Plan: General   Post-op Pain Management:    Induction:   PONV Risk Score and Plan: Propofol infusion  Airway Management Planned:   Additional Equipment:   Intra-op Plan:   Post-operative Plan:   Informed Consent: I have reviewed the patients History and Physical, chart, labs and discussed the procedure including the risks, benefits and alternatives for the proposed anesthesia with the patient or authorized representative who has indicated his/her understanding and acceptance.     Dental Advisory Given  Plan Discussed with: CRNA  Anesthesia Plan Comments:        Anesthesia Quick Evaluation

## 2023-02-26 NOTE — Transfer of Care (Signed)
Immediate Anesthesia Transfer of Care Note  Patient: [name]  Procedure(s) Performed: COLONOSCOPY WITH PROPOFOL POLYPECTOMY  Patient Location: Short Stay  Anesthesia Type:General  Level of Consciousness: awake, drowsy, and patient cooperative  Airway & Oxygen Therapy: Patient Spontanous Breathing  Post-op Assessment: Report given to RN, Post -op Vital signs reviewed and stable, Patient moving all extremities X 4, and Patient able to stick tongue midline  Post vital signs: Reviewed and stable  Last Vitals:  Vitals Value Taken Time  BP 94/54 02/26/23 1114  Temp 36.6 C 02/26/23 1114  Pulse 61 02/26/23 1114  Resp 10 02/26/23 1114  SpO2 93 % 02/26/23 1114    Last Pain:  Vitals:   02/26/23 1114  TempSrc: Axillary  PainSc: 0-No pain      Patients Stated Pain Goal: 7 (02/26/23 1025)  Complications: No notable events documented.

## 2023-03-01 LAB — SURGICAL PATHOLOGY

## 2023-03-02 ENCOUNTER — Encounter: Payer: Self-pay | Admitting: Internal Medicine

## 2023-03-08 ENCOUNTER — Encounter (HOSPITAL_COMMUNITY): Payer: Self-pay | Admitting: Internal Medicine

## 2023-04-24 ENCOUNTER — Other Ambulatory Visit: Payer: Self-pay | Admitting: Cardiovascular Disease

## 2023-05-03 DIAGNOSIS — E1165 Type 2 diabetes mellitus with hyperglycemia: Secondary | ICD-10-CM | POA: Diagnosis not present

## 2023-05-07 DIAGNOSIS — E1140 Type 2 diabetes mellitus with diabetic neuropathy, unspecified: Secondary | ICD-10-CM | POA: Diagnosis not present

## 2023-05-07 DIAGNOSIS — N189 Chronic kidney disease, unspecified: Secondary | ICD-10-CM | POA: Diagnosis not present

## 2023-05-07 DIAGNOSIS — E1165 Type 2 diabetes mellitus with hyperglycemia: Secondary | ICD-10-CM | POA: Diagnosis not present

## 2023-05-07 DIAGNOSIS — I2510 Atherosclerotic heart disease of native coronary artery without angina pectoris: Secondary | ICD-10-CM | POA: Diagnosis not present

## 2023-05-07 DIAGNOSIS — I639 Cerebral infarction, unspecified: Secondary | ICD-10-CM | POA: Diagnosis not present

## 2023-05-07 DIAGNOSIS — E7800 Pure hypercholesterolemia, unspecified: Secondary | ICD-10-CM | POA: Diagnosis not present

## 2023-05-07 DIAGNOSIS — I10 Essential (primary) hypertension: Secondary | ICD-10-CM | POA: Diagnosis not present

## 2023-05-10 ENCOUNTER — Other Ambulatory Visit: Payer: Self-pay | Admitting: Cardiovascular Disease

## 2023-05-16 ENCOUNTER — Other Ambulatory Visit: Payer: Self-pay | Admitting: Cardiovascular Disease

## 2023-05-17 ENCOUNTER — Telehealth: Payer: Self-pay | Admitting: Cardiovascular Disease

## 2023-05-17 ENCOUNTER — Other Ambulatory Visit: Payer: Self-pay | Admitting: Cardiovascular Disease

## 2023-05-17 MED ORDER — FUROSEMIDE 40 MG PO TABS: 40.0000 mg | ORAL_TABLET | Freq: Every day | ORAL | 0 refills | Status: DC

## 2023-05-17 MED ORDER — METOPROLOL TARTRATE 100 MG PO TABS: 100.0000 mg | ORAL_TABLET | Freq: Two times a day (BID) | ORAL | 2 refills | Status: DC

## 2023-05-17 MED ORDER — AMLODIPINE BESYLATE 10 MG PO TABS: 10.0000 mg | ORAL_TABLET | Freq: Every day | ORAL | 0 refills | Status: DC

## 2023-05-17 NOTE — Telephone Encounter (Signed)
*  STAT* If patient is at the pharmacy, call can be transferred to refill team.   1. Which medications need to be refilled? (please list name of each medication and dose if known)   furosemide  (LASIX ) 40 MG tablet  TAKE 1 TABLET BY MOUTH TWICE A DAY FOR 3 DAYS THEN REDUCE DOWN TO 1 TABLET BY MOUTH DAILY   amLODipine  (NORVASC ) 10 MG tablet   TAKE 1 TABLET BY MOUTH EVERY DAY  metoprolol  tartrate (LOPRESSOR ) 100 MG tablet  Take 1 tablet (100 mg total) by mouth 2 (two) times daily.   2. Would you like to learn more about the convenience, safety, & potential cost savings by using the [facility]? No   3. Are you open to using the [facility] No   4. Which pharmacy/location (including street and city if local pharmacy) is medication to be sent to?[id number] - [place], [place] - [address] AT [address]    5. Do they need a 30 day or 90 day supply?  90 Day Supply   Pt is currently out of medication.

## 2023-05-17 NOTE — Telephone Encounter (Signed)
 Pt's medications were sent to pt's pharmacy as requested. Confirmation received.

## 2023-05-21 ENCOUNTER — Other Ambulatory Visit: Payer: Self-pay | Admitting: Cardiovascular Disease

## 2023-06-04 DIAGNOSIS — E1165 Type 2 diabetes mellitus with hyperglycemia: Secondary | ICD-10-CM | POA: Diagnosis not present

## 2023-07-05 DIAGNOSIS — E1159 Type 2 diabetes mellitus with other circulatory complications: Secondary | ICD-10-CM | POA: Diagnosis not present

## 2023-07-05 DIAGNOSIS — R2681 Unsteadiness on feet: Secondary | ICD-10-CM | POA: Diagnosis not present

## 2023-07-05 DIAGNOSIS — Z008 Encounter for other general examination: Secondary | ICD-10-CM | POA: Diagnosis not present

## 2023-07-05 DIAGNOSIS — N1831 Chronic kidney disease, stage 3a: Secondary | ICD-10-CM | POA: Diagnosis not present

## 2023-07-05 DIAGNOSIS — Z794 Long term (current) use of insulin: Secondary | ICD-10-CM | POA: Diagnosis not present

## 2023-07-05 DIAGNOSIS — I509 Heart failure, unspecified: Secondary | ICD-10-CM | POA: Diagnosis not present

## 2023-07-05 DIAGNOSIS — E1122 Type 2 diabetes mellitus with diabetic chronic kidney disease: Secondary | ICD-10-CM | POA: Diagnosis not present

## 2023-07-05 DIAGNOSIS — I25119 Atherosclerotic heart disease of native coronary artery with unspecified angina pectoris: Secondary | ICD-10-CM | POA: Diagnosis not present

## 2023-07-05 DIAGNOSIS — E1142 Type 2 diabetes mellitus with diabetic polyneuropathy: Secondary | ICD-10-CM | POA: Diagnosis not present

## 2023-07-05 DIAGNOSIS — I130 Hypertensive heart and chronic kidney disease with heart failure and stage 1 through stage 4 chronic kidney disease, or unspecified chronic kidney disease: Secondary | ICD-10-CM | POA: Diagnosis not present

## 2023-07-05 DIAGNOSIS — E785 Hyperlipidemia, unspecified: Secondary | ICD-10-CM | POA: Diagnosis not present

## 2023-07-05 DIAGNOSIS — E669 Obesity, unspecified: Secondary | ICD-10-CM | POA: Diagnosis not present

## 2023-07-05 DIAGNOSIS — Z8673 Personal history of transient ischemic attack (TIA), and cerebral infarction without residual deficits: Secondary | ICD-10-CM | POA: Diagnosis not present

## 2023-07-05 DIAGNOSIS — Z860100 Personal history of colon polyps, unspecified: Secondary | ICD-10-CM | POA: Diagnosis not present

## 2023-07-05 DIAGNOSIS — E1169 Type 2 diabetes mellitus with other specified complication: Secondary | ICD-10-CM | POA: Diagnosis not present

## 2023-07-05 DIAGNOSIS — Z6834 Body mass index (BMI) 34.0-34.9, adult: Secondary | ICD-10-CM | POA: Diagnosis not present

## 2023-07-09 DIAGNOSIS — E559 Vitamin D deficiency, unspecified: Secondary | ICD-10-CM | POA: Diagnosis not present

## 2023-07-09 DIAGNOSIS — I10 Essential (primary) hypertension: Secondary | ICD-10-CM | POA: Diagnosis not present

## 2023-07-09 DIAGNOSIS — E1165 Type 2 diabetes mellitus with hyperglycemia: Secondary | ICD-10-CM | POA: Diagnosis not present

## 2023-07-10 LAB — LAB REPORT - SCANNED
A1c: 7.6
Albumin, Urine POC: 5.6
Creatinine, POC: 56.7 mg/dL
EGFR: 46
Microalb Creat Ratio: 10

## 2023-07-15 DIAGNOSIS — I10 Essential (primary) hypertension: Secondary | ICD-10-CM | POA: Diagnosis not present

## 2023-07-15 DIAGNOSIS — Z8619 Personal history of other infectious and parasitic diseases: Secondary | ICD-10-CM | POA: Diagnosis not present

## 2023-07-15 DIAGNOSIS — Z951 Presence of aortocoronary bypass graft: Secondary | ICD-10-CM | POA: Diagnosis not present

## 2023-07-15 DIAGNOSIS — M79604 Pain in right leg: Secondary | ICD-10-CM | POA: Diagnosis not present

## 2023-07-15 DIAGNOSIS — I130 Hypertensive heart and chronic kidney disease with heart failure and stage 1 through stage 4 chronic kidney disease, or unspecified chronic kidney disease: Secondary | ICD-10-CM | POA: Diagnosis not present

## 2023-07-15 DIAGNOSIS — I509 Heart failure, unspecified: Secondary | ICD-10-CM | POA: Diagnosis not present

## 2023-07-15 DIAGNOSIS — M25561 Pain in right knee: Secondary | ICD-10-CM | POA: Diagnosis not present

## 2023-07-15 DIAGNOSIS — E1165 Type 2 diabetes mellitus with hyperglycemia: Secondary | ICD-10-CM | POA: Diagnosis not present

## 2023-07-15 DIAGNOSIS — E559 Vitamin D deficiency, unspecified: Secondary | ICD-10-CM | POA: Diagnosis not present

## 2023-07-15 DIAGNOSIS — E782 Mixed hyperlipidemia: Secondary | ICD-10-CM | POA: Diagnosis not present

## 2023-07-15 DIAGNOSIS — N1831 Chronic kidney disease, stage 3a: Secondary | ICD-10-CM | POA: Diagnosis not present

## 2023-07-15 DIAGNOSIS — M79605 Pain in left leg: Secondary | ICD-10-CM | POA: Diagnosis not present

## 2023-08-02 NOTE — Progress Notes (Unsigned)
 Cardiology Office Note:    Date:  08/03/2023  ID:  Ludie Saddler, DOB 01-21-1951, MRN [id number] PCP: Wendi Ham, NP  [facility] Providers Cardiologist:  Arnoldo Lapping, MD Cardiology APP:  Gabino Joe, PA-C       Patient Profile:      Coronary artery disease  S/p CABG in 2009 Myoview  10/29/2017: No ischemia, low risk Cath 6/21: patent grafts, Med Rx  LHC 08/06/2021: LAD proximal 100, LCx 100, RCA severe subtotal stenosis; LIMA-LAD, SVG-OM/RPL (mid 40-50), SVG-D1, SVG-RPDA patent; PCWP 14, mean PA 25 (HFpEF) heart failure with preserved ejection fraction  TTE 08/11/2021: EF 50-55, no RWMA, GRII DD, mild reduced RVSF, RVSP 29.8 (normal PASP), mild LAE, trivial MR, trivial AI, mildly dilated ascending aorta (41 mm)  TTE ([facility]) 05/05/22: EF 52, Gr 1 DD, mean AV gradient 5 mmHg, trace AI, Gr 1 DD. Aorta size is normal on this study.  Hypertension  Hyperlipidemia  Diabetes mellitus  Hx of diabetic foot ulcer Peripheral arterial disease   eval by Dr. Alvenia Aus in 2019 >> small vessel dz >> med Rx  S/p cryptogenic CVA 01/2019 2 vascular territories (R temporoparietal and occipital) - ?cardioembolic  Event monitor done by PCP - No AFib  Pt declined ref to EP for ILR Chronic kidney disease  Dilated ascending aorta  TTE 08/2021: 41 mm TTE 04/2022: Normal Carotid US  01/31/2019: Minimal plaque, no hemodynamically significant stenosis OSA         Discussed the use of AI scribe software for clinical note transcription with the patient, who gave verbal consent to proceed.  History of Present Illness [name] is a 73 year old male with coronary artery disease and heart failure who presents for a semi-annual follow-up.  He is here alone.  He has not had chest pain or symptoms suggestive of angina.  He has not had shortness of breath, orthopnea, or syncope. No significant changes in leg pain and no non-healing sores on his feet. He experiences leg pain,  particularly in the back of his legs, which has improved since he stopped physical therapy. He also reports shoulder pain that has been persistent but is improving. He has carpal tunnel symptoms, with fingers going numb and a trigger finger, particularly after activities like fishing.  He worked for years as a Music therapist He reports swelling in his legs, which decreases when he elevates them.    ROS-See HPI    Studies Reviewed:        Results Labs-chart review 02/23/2023: K 3.7, creatinine 1.48, e GFR 51  LABS - From PCP personally reviewed and interpreted Triglycerides: 153 (07/09/2023) Creatinine: 1.59 (07/10/2023) LDL: 64 (07/10/2023)    Risk Assessment/Calculations:             Physical Exam:   VS:  BP 104/60   Pulse 68   Ht 5\' 10"  (1.778 m)   Wt 241 lb 9.6 oz (109.6 kg)   SpO2 94%   BMI 34.67 kg/m    Wt Readings from Last 3 Encounters:  08/03/23 241 lb 9.6 oz (109.6 kg)  02/26/23 242 lb 11.6 oz (110.1 kg)  02/23/23 242 lb 11.6 oz (110.1 kg)    Constitutional:      Appearance: Healthy appearance. Not in distress.  Neck:     Vascular: JVD normal.  Pulmonary:     Breath sounds: Normal breath sounds. No wheezing. No rales.  Cardiovascular:     Normal rate. Regular rhythm.  Murmurs: There is no murmur.  Edema:    Peripheral edema present.    Pretibial: bilateral 1+ edema of the pretibial area. Abdominal:     Palpations: Abdomen is soft.        Assessment and Plan:   Assessment & Plan Coronary artery disease involving native coronary artery of native heart with angina pectoris [facility]) Coronary artery disease, status post coronary artery bypass grafting (CABG) in 2009. Cardiac catheterization in May 2023 showed patent bypass grafts. Currently asymptomatic with no chest pain or angina. Recent LDL was optimal at 64 mg/dL as of July 10, 2023. - Continue amlodipine  10 mg daily - Continue aspirin  81 mg daily - Continue Plavix  75 mg daily - Continue Zetia  10 mg  daily - Continue Imdur  60 mg daily - Continue metoprolol  tartrate 100 mg daily - Continue Crestor  5 mg daily - Continue nitroglycerin  as needed Chronic heart failure with preserved ejection fraction (HCC) Ejection fraction was 52% on echocardiogram in January 2024. NYHA class II symptoms. No significant dyspnea or edema reported. Symptoms of leg swelling likely due to venous insufficiency, as swelling decreases with leg elevation and compression socks are recommended. - Continue Lasix  40 mg daily - Recommend compression socks for venous insufficiency - Encourage leg elevation  Essential hypertension Hypertension with controlled blood pressure. - Continue amlodipine  10 mg daily - Continue Lasix  40 mg daily - Continue Imdur  60 mg daily - Continue metoprolol  tartrate 100 mg twice daily Hyperlipidemia LDL goal <70 Hyperlipidemia with optimal LDL levels. - Continue Zetia  10 mg daily - Continue Crestor  5 mg daily PAD (peripheral artery disease) (HCC) Peripheral arterial disease with small vessel disease, previously evaluated by [facility] cardiology in 2019. Managed medically with no significant change in symptoms. No new claudication or non-healing ulcers reported. Bilateral carpal tunnel syndrome Carpal tunnel syndrome with symptoms of finger numbness and weakness, particularly in the right hand. No other signs or symptoms of cardiac amyloid. He does not have AS, spinal stenosis, biceps tendon rupture, etc. He was a Music therapist and it is not unusual for him to have CTS. If additional symptoms develop, further testing such as PYP scan and SPEP/UPEP may be considered.       Dispo:  Return in about 1 year (around 08/02/2024) for Routine Follow Up, w/ Marlyse Single, PA-C.  Signed, Marlyse Single, PA-C

## 2023-08-03 ENCOUNTER — Encounter: Payer: Self-pay | Admitting: Physician Assistant

## 2023-08-03 ENCOUNTER — Ambulatory Visit: Payer: No Typology Code available for payment source | Attending: Physician Assistant | Admitting: Physician Assistant

## 2023-08-03 VITALS — BP 104/60 | HR 68 | Ht 70.0 in | Wt 241.6 lb

## 2023-08-03 DIAGNOSIS — E785 Hyperlipidemia, unspecified: Secondary | ICD-10-CM | POA: Diagnosis not present

## 2023-08-03 DIAGNOSIS — I10 Essential (primary) hypertension: Secondary | ICD-10-CM | POA: Diagnosis not present

## 2023-08-03 DIAGNOSIS — G5603 Carpal tunnel syndrome, bilateral upper limbs: Secondary | ICD-10-CM

## 2023-08-03 DIAGNOSIS — N1831 Chronic kidney disease, stage 3a: Secondary | ICD-10-CM

## 2023-08-03 DIAGNOSIS — I5032 Chronic diastolic (congestive) heart failure: Secondary | ICD-10-CM

## 2023-08-03 DIAGNOSIS — I25119 Atherosclerotic heart disease of native coronary artery with unspecified angina pectoris: Secondary | ICD-10-CM

## 2023-08-03 DIAGNOSIS — I739 Peripheral vascular disease, unspecified: Secondary | ICD-10-CM

## 2023-08-03 NOTE — Assessment & Plan Note (Signed)
 Coronary artery disease, status post coronary artery bypass grafting (CABG) in 2009. Cardiac catheterization in May 2023 showed patent bypass grafts. Currently asymptomatic with no chest pain or angina. Recent LDL was optimal at 64 mg/dL as of July 10, 2023. - Continue amlodipine  10 mg daily - Continue aspirin  81 mg daily - Continue Plavix  75 mg daily - Continue Zetia  10 mg daily - Continue Imdur  60 mg daily - Continue metoprolol  tartrate 100 mg daily - Continue Crestor  5 mg daily - Continue nitroglycerin  as needed

## 2023-08-03 NOTE — Assessment & Plan Note (Signed)
 Hypertension with controlled blood pressure. - Continue amlodipine  10 mg daily - Continue Lasix  40 mg daily - Continue Imdur  60 mg daily - Continue metoprolol  tartrate 100 mg twice daily

## 2023-08-03 NOTE — Assessment & Plan Note (Signed)
 Hyperlipidemia with optimal LDL levels. - Continue Zetia  10 mg daily - Continue Crestor  5 mg daily

## 2023-08-03 NOTE — Assessment & Plan Note (Signed)
 Ejection fraction was 52% on echocardiogram in January 2024. NYHA class II symptoms. No significant dyspnea or edema reported. Symptoms of leg swelling likely due to venous insufficiency, as swelling decreases with leg elevation and compression socks are recommended. - Continue Lasix  40 mg daily - Recommend compression socks for venous insufficiency - Encourage leg elevation

## 2023-08-03 NOTE — Patient Instructions (Signed)
 Medication Instructions:  No changes. See your medication list *If you need a refill on your cardiac medications before your next appointment, please call your pharmacy*  Follow-Up: At [facility], you and your health needs are our priority.  As part of our continuing mission to provide you with exceptional heart care, our providers are all part of one team.  This team includes your primary Cardiologist (physician) and Advanced Practice Providers or APPs (Physician Assistants and Nurse Practitioners) who all work together to provide you with the care you need, when you need it.  Your next appointment:   12 month(s)  Provider:   Marlyse Single, PA-C          We recommend signing up for the patient portal called "MyChart".  Sign up information is provided on this After Visit Summary.  MyChart is used to connect with patients for Virtual Visits (Telemedicine).  Patients are able to view lab/test results, encounter notes, upcoming appointments, etc.  Non-urgent messages can be sent to your provider as well.   To learn more about what you can do with MyChart, go to [email or web address].   Other Instructions

## 2023-08-03 NOTE — Assessment & Plan Note (Signed)
 Peripheral arterial disease with small vessel disease, previously evaluated by [facility] cardiology in 2019. Managed medically with no significant change in symptoms. No new claudication or non-healing ulcers reported.

## 2023-08-04 ENCOUNTER — Other Ambulatory Visit: Payer: Self-pay | Admitting: Cardiovascular Disease

## 2023-08-10 ENCOUNTER — Other Ambulatory Visit: Payer: Self-pay | Admitting: Physician Assistant

## 2023-08-13 ENCOUNTER — Other Ambulatory Visit: Payer: Self-pay | Admitting: Physician Assistant

## 2023-08-14 ENCOUNTER — Other Ambulatory Visit: Payer: Self-pay | Admitting: Cardiovascular Disease

## 2023-08-27 ENCOUNTER — Other Ambulatory Visit: Payer: Self-pay | Admitting: Cardiovascular Disease

## 2023-09-14 DIAGNOSIS — M65331 Trigger finger, right middle finger: Secondary | ICD-10-CM | POA: Diagnosis not present

## 2023-09-14 DIAGNOSIS — G5603 Carpal tunnel syndrome, bilateral upper limbs: Secondary | ICD-10-CM | POA: Diagnosis not present

## 2023-09-16 ENCOUNTER — Telehealth: Payer: Self-pay | Admitting: Cardiovascular Disease

## 2023-09-16 NOTE — Telephone Encounter (Signed)
   Pre-operative Risk Assessment    Patient Name: [name]  DOB: [birth year] MRN: [id number]      Request for Surgical Clearance    Procedure:  Right endoscopy tunnel release, long finger trigger release  Date of Surgery:  Clearance 09/29/23                                 Surgeon:  Dr. Rober Chimera Surgeon's Group or Practice Name:  [facility] Phone number:  [phone] Fax number:  [phone]   Type of Clearance Requested:   - Medical  - Pharmacy:  Hold Clopidogrel  (Plavix ) def cards   Type of Anesthesia:  MAC   Additional requests/questions:    SignedBerniece Brisk   09/16/2023, 8:31 AM

## 2023-09-16 NOTE — Telephone Encounter (Signed)
 [name], you recently saw this patient on 08/03/23. Could you please comment on medical clearance for upcoming surgery.  Per office protocol, he may hold Plavix  for 5 days prior to procedure and should resume as soon as hemodynamically stable postoperatively.  [name] Koch, NP-C  09/16/2023, 10:18 AM [address], [address] [place], [place] [address] Office [phone] Fax [phone]

## 2023-09-17 NOTE — Telephone Encounter (Signed)
   Patient Name: [name]  DOB: 05/04/1950 MRN: [id number]  Primary Cardiologist: Arnoldo Lapping, MD  Chart reviewed as part of pre-operative protocol coverage. [name] had been seen by Marlyse Single PA-C on 08/03/23. Reached out to [place] for preop recommendations. [name] can proceed with low risk procedure at acceptable CV risk.   Per office protocol, [name] may hold Plavix  for 5 days prior to procedure and should resume as soon as hemodynamically stable postoperatively. [name] should remain on aspirin  81 mg daily throughout the perioperative period, unless bleeding risk is felt to be high.   I will route this to requesting team to serve as preop recommendations.    Debria Fang, PA-C 09/17/2023, 2:17 PM

## 2023-09-17 NOTE — Telephone Encounter (Signed)
 He can proceed with low risk procedure at acceptable CV risk. Marlyse Single, PA-C    09/17/2023 1:46 PM

## 2023-09-22 DIAGNOSIS — G5603 Carpal tunnel syndrome, bilateral upper limbs: Secondary | ICD-10-CM | POA: Diagnosis not present

## 2023-09-22 DIAGNOSIS — G5601 Carpal tunnel syndrome, right upper limb: Secondary | ICD-10-CM | POA: Diagnosis not present

## 2023-09-22 DIAGNOSIS — G5602 Carpal tunnel syndrome, left upper limb: Secondary | ICD-10-CM | POA: Diagnosis not present

## 2023-09-29 DIAGNOSIS — G5601 Carpal tunnel syndrome, right upper limb: Secondary | ICD-10-CM | POA: Diagnosis not present

## 2023-09-29 DIAGNOSIS — M65331 Trigger finger, right middle finger: Secondary | ICD-10-CM | POA: Diagnosis not present

## 2023-10-14 DIAGNOSIS — R2689 Other abnormalities of gait and mobility: Secondary | ICD-10-CM | POA: Diagnosis not present

## 2023-10-20 DIAGNOSIS — H5203 Hypermetropia, bilateral: Secondary | ICD-10-CM | POA: Diagnosis not present

## 2023-11-04 DIAGNOSIS — E1165 Type 2 diabetes mellitus with hyperglycemia: Secondary | ICD-10-CM | POA: Diagnosis not present

## 2023-11-04 DIAGNOSIS — N189 Chronic kidney disease, unspecified: Secondary | ICD-10-CM | POA: Diagnosis not present

## 2023-11-05 ENCOUNTER — Other Ambulatory Visit: Payer: Self-pay | Admitting: Cardiovascular Disease

## 2023-11-05 ENCOUNTER — Other Ambulatory Visit: Payer: Self-pay

## 2023-11-05 MED ORDER — FUROSEMIDE 40 MG PO TABS: 40.0000 mg | ORAL_TABLET | Freq: Every day | ORAL | 2 refills | Status: AC

## 2023-11-08 DIAGNOSIS — I639 Cerebral infarction, unspecified: Secondary | ICD-10-CM | POA: Diagnosis not present

## 2023-11-08 DIAGNOSIS — I10 Essential (primary) hypertension: Secondary | ICD-10-CM | POA: Diagnosis not present

## 2023-11-08 DIAGNOSIS — E7800 Pure hypercholesterolemia, unspecified: Secondary | ICD-10-CM | POA: Diagnosis not present

## 2023-11-08 DIAGNOSIS — N189 Chronic kidney disease, unspecified: Secondary | ICD-10-CM | POA: Diagnosis not present

## 2023-11-08 DIAGNOSIS — E1140 Type 2 diabetes mellitus with diabetic neuropathy, unspecified: Secondary | ICD-10-CM | POA: Diagnosis not present

## 2023-11-08 DIAGNOSIS — I2510 Atherosclerotic heart disease of native coronary artery without angina pectoris: Secondary | ICD-10-CM | POA: Diagnosis not present

## 2023-11-08 DIAGNOSIS — E1165 Type 2 diabetes mellitus with hyperglycemia: Secondary | ICD-10-CM | POA: Diagnosis not present

## 2023-12-10 ENCOUNTER — Telehealth: Payer: Self-pay | Admitting: Physician Assistant

## 2023-12-10 MED ORDER — NITROGLYCERIN 0.4 MG SL SUBL: SUBLINGUAL_TABLET | SUBLINGUAL | 7 refills | Status: AC

## 2023-12-10 NOTE — Telephone Encounter (Signed)
 Pt's medication was sent to pt's pharmacy as requested. Confirmation received.

## 2023-12-10 NOTE — Telephone Encounter (Signed)
*  STAT* If patient is at the pharmacy, call can be transferred to refill team.   1. Which medications need to be refilled? (please list name of each medication and dose if known) nitroGLYCERIN  (NITROSTAT ) 0.4 MG SL tablet    2. Would you like to learn more about the convenience, safety, & potential cost savings by using the [facility]?     3. Are you open to using the [facility] (Type [facility].  ).   4. Which pharmacy/location (including street and city if local pharmacy) is medication to be sent to? [id number] - [place], [place] - [address] AT [address]    5. Do they need a 30 day or 90 day supply? 90 day

## 2024-01-05 DIAGNOSIS — M48062 Spinal stenosis, lumbar region with neurogenic claudication: Secondary | ICD-10-CM | POA: Diagnosis not present

## 2024-01-06 ENCOUNTER — Telehealth (HOSPITAL_BASED_OUTPATIENT_CLINIC_OR_DEPARTMENT_OTHER): Payer: Self-pay | Admitting: *Deleted

## 2024-01-06 ENCOUNTER — Telehealth (HOSPITAL_BASED_OUTPATIENT_CLINIC_OR_DEPARTMENT_OTHER): Payer: Self-pay

## 2024-01-06 NOTE — Telephone Encounter (Signed)
   Pre-operative Risk Assessment    Patient Name: [name]  DOB: 1950-10-14 MRN: [id number]   Date of last office visit: 08/03/23 GLENDIA FERRIER, PAC Date of next office visit: NONE   Request for Surgical Clearance    Procedure:  LUMBAR EPIDURAL   Date of Surgery:  Clearance TBD                                Surgeon:  DR. SCOT FLATTEN Surgeon's Group or Practice Name:  [facility] Phone number:  [phone] ATTN: ANNITTA DARNEL, [place] Fax number:  [phone]   Type of Clearance Requested:   - Medical  - Pharmacy:  Hold Aspirin  and Clopidogrel  (Plavix )     Type of Anesthesia:  Not Indicated   Additional requests/questions:    Bonney Niels Jest   01/06/2024, 12:32 PM

## 2024-01-06 NOTE — Telephone Encounter (Signed)
   Name: [name]  DOB: 1950/08/06  MRN: [id number]  Primary Cardiologist: Ozell Fell, MD   Preoperative team, please contact this patient and set up a phone call appointment for further preoperative risk assessment. Please obtain consent and complete medication review. Thank you for your help.  I confirm that guidance regarding antiplatelet and oral anticoagulation therapy has been completed and, if necessary, noted below.  Hx of CABG, LHC in 2023 with patent grafts. May hold antiplatelets for spinal injection. Prefer to continue ASA, but defer to surgeon.  I also confirmed the patient resides in the state of [place] . As per [facility] Medical Board telemedicine laws, the patient must reside in the state in which the provider is licensed.   Jon Nat Hails, PA 01/06/2024, 12:45 PM [facility]

## 2024-01-06 NOTE — Telephone Encounter (Signed)
 Appointment scheduled on 01/13/2024 @ 9:20. Med req and consent are complete. Call patient at [phone]

## 2024-01-06 NOTE — Telephone Encounter (Signed)
  Patient Consent for Virtual Visit         [name] has provided verbal consent on 01/06/2024 for a virtual visit (video or telephone). Appointment scheduled on 01/13/2024 @ 9:20. Med req and consent are complete. Call patient at [phone]    CONSENT FOR VIRTUAL VISIT FOR:  [name]  By participating in this virtual visit I agree to the following:  I hereby voluntarily request, consent and authorize [facility] and its employed or contracted physicians, physician assistants, nurse practitioners or other licensed health care professionals (the Practitioner), to provide me with telemedicine health care services (the "Services) as deemed necessary by the treating Practitioner. I acknowledge and consent to receive the Services by the Practitioner via telemedicine. I understand that the telemedicine visit will involve communicating with the Practitioner through live audiovisual communication technology and the disclosure of certain medical information by electronic transmission. I acknowledge that I have been given the opportunity to request an in-person assessment or other available alternative prior to the telemedicine visit and am voluntarily participating in the telemedicine visit.  I understand that I have the right to withhold or withdraw my consent to the use of telemedicine in the course of my care at any time, without affecting my right to future care or treatment, and that the Practitioner or I may terminate the telemedicine visit at any time. I understand that I have the right to inspect all information obtained and/or recorded in the course of the telemedicine visit and may receive copies of available information for a reasonable fee.  I understand that some of the potential risks of receiving the Services via telemedicine include:  Delay or interruption in medical evaluation due to technological equipment failure or disruption; Information transmitted may not  be sufficient (e.g. poor resolution of images) to allow for appropriate medical decision making by the Practitioner; and/or  In rare instances, security protocols could fail, causing a breach of personal health information.  Furthermore, I acknowledge that it is my responsibility to provide information about my medical history, conditions and care that is complete and accurate to the best of my ability. I acknowledge that Practitioner's advice, recommendations, and/or decision may be based on factors not within their control, such as incomplete or inaccurate data provided by me or distortions of diagnostic images or specimens that may result from electronic transmissions. I understand that the practice of medicine is not an exact science and that Practitioner makes no warranties or guarantees regarding treatment outcomes. I acknowledge that a copy of this consent can be made available to me via my patient portal [facility] MyChart), or I can request a printed copy by calling the office of [facility].    I understand that my insurance will be billed for this visit.   I have read or had this consent read to me. I understand the contents of this consent, which adequately explains the benefits and risks of the Services being provided via telemedicine.  I have been provided ample opportunity to ask questions regarding this consent and the Services and have had my questions answered to my satisfaction. I give my informed consent for the services to be provided through the use of telemedicine in my medical care

## 2024-01-13 ENCOUNTER — Ambulatory Visit: Attending: Cardiovascular Disease

## 2024-01-13 DIAGNOSIS — Z01810 Encounter for preprocedural cardiovascular examination: Secondary | ICD-10-CM

## 2024-01-13 NOTE — Progress Notes (Signed)
 Virtual Visit via Telephone Note   Because of [name]. Hase co-morbid illnesses, he is at least at moderate risk for complications without adequate follow up.  This format is felt to be most appropriate for this patient at this time.  Due to technical limitations with video connection Web designer), today's appointment will be conducted as an audio only telehealth visit, and Mubarak Bevens verbally agreed to proceed in this manner.   All issues noted in this document were discussed and addressed.  No physical exam could be performed with this format.  Evaluation Performed:  Preoperative cardiovascular risk assessment _____________   Date:  01/13/2024   Patient ID:  [name], DOB Feb 28, 1951, MRN [id number] Patient Location:  Home Provider location:   Office  Primary Care Provider:  Hyacinth Honey, NP Primary Cardiologist:  Ozell Fell, MD  Chief Complaint / Patient Profile   73 y.o. y/o male with a h/o coronary artery disease, hypertension, hyperlipidemia, peripheral arterial disease, bilateral carpal tunnel syndrome who is pending epidural injection and presents today for telephonic preoperative cardiovascular risk assessment.  History of Present Illness    [name] is a 73 y.o. male who presents via audio/video conferencing for a telehealth visit today.  Pt was last seen in cardiology clinic on 08/03/2023 by Glendia Ferrier, PA-C.  At that time Leldon Steege was doing well .  The patient is now pending procedure as outlined above. Since his last visit, he remains stable from a cardiac standpoint.  Today he denies chest pain, shortness of breath, lower extremity edema, fatigue, palpitations, melena, hematuria, hemoptysis, diaphoresis, weakness, presyncope, syncope, orthopnea, and PND.   Past Medical History    Past Medical History:  Diagnosis Date   (HFpEF) heart failure with preserved ejection fraction (HCC) 08/11/2021   Echo 08/2021: EF 50-55, no RWMA, GR  2 DD, mildly reduced RVSF, normal PASP, RVSP 29.8, mild LAE, trivial MR, trivial AI, mild dilation of ascending aorta (41 mm)   Allergy    Ascending aorta dilation 08/11/2021   Echo 08/2021: EF 50-55, no RWMA, GR 2 DD, mildly reduced RVSF, normal PASP, RVSP 29.8, mild LAE, trivial MR, trivial AI, mild dilation of ascending aorta (41 mm)   CAD (coronary artery disease), native coronary artery    Multiple PCI procedures followed by CABG in 2009 with LIMA-LAD, SVG-D, seq SVG-OM and PLV, SVG-PDA. Echo (3/12): EF 60-65%, mild MR. // Nuc Stress Test 7/19:  EF 50, artifact, no ischemia, low risk    Carotid artery disease 01/31/2017   dopplers 1/14:  0-39% bilat   Carotid stenosis    dopplers 1/14:  0-39% bilat   Cataract    REMOVED   Colon polyps 2006   Colonoscopy-Dr. Patterson(Tubular Adenoma)    Coronary Artery Disease 06/03/2008   Annotation: S/P CABG 2009 Qualifier: Diagnosis of  By: Obie, MD, CODY Wolm Gosling    DM2 (diabetes mellitus, type 2) (HCC)    Followed by Dr. Gretta   Essential hypertension 06/03/2008   Qualifier: Diagnosis of  By: Obie, MD, CODY Wolm Gosling    History of echocardiogram    Echo 7/19: Mild LVH, EF 55-60, normal diastolic function, trivial AI, moderate LAE   HLD (hyperlipidemia)    HTN (hypertension)    Hx of cardiovascular stress test    ETT-Myoview  (03/2013):  Anteroseptal reversible defect, EF 54%, intermediate risk.   Hyperlipidemia 06/03/2008   Qualifier: Diagnosis of  By: Obie, MD, CODY Wolm Gosling    Ischemic stroke [facility])  01/30/2019   OSA (obstructive sleep apnea) 08/25/2011   PAD (peripheral artery disease) 12/10/2017   Postoperative atrial fibrillation (HCC) 2009   Renal insufficiency 01/31/2019   Stroke [facility])    Type 2 diabetes mellitus with complication, with long-term current use of insulin  (HCC) 06/03/2008   Qualifier: Diagnosis of  By: Obie, MD, CODY Wolm Gosling    Ulcer    left foot   Varicose veins of lower extremities with other  complications 12/13/2013   Past Surgical History:  Procedure Laterality Date   APPENDECTOMY  2005   BACK SURGERY  2006   CARDIAC CATHETERIZATION  2009   EF 60%   COLONOSCOPY     COLONOSCOPY WITH PROPOFOL  N/A 02/26/2023   Procedure: COLONOSCOPY WITH PROPOFOL ;  Surgeon: Shaaron [name] HERO, MD;  [facility];  Service: Endoscopy;  Laterality: N/A;  10:00am, asa 3   CORONARY ARTERY BYPASS GRAFT  2009   post op. a fib, LIMA -LAD, SVG-D,seq SVG-OM and PLV,  SVG-PDA.   EYE SURGERY     LEFT HEART CATH AND CORS/GRAFTS ANGIOGRAPHY N/A 10/04/2019   Procedure: LEFT HEART CATH AND CORS/GRAFTS ANGIOGRAPHY;  Surgeon: Wonda Sharper, MD;  Location: [facility] INVASIVE CV LAB;  Service: Cardiovascular;  Laterality: N/A;   POLYPECTOMY  02/26/2023   Procedure: POLYPECTOMY;  Surgeon: Shaaron [name] HERO, MD;  Location: [facility];  Service: Endoscopy;;   RIGHT/LEFT HEART CATH AND CORONARY/GRAFT ANGIOGRAPHY N/A 08/06/2021   Procedure: RIGHT/LEFT HEART CATH AND CORONARY/GRAFT ANGIOGRAPHY;  Surgeon: Wonda Sharper, MD;  Location: [facility] INVASIVE CV LAB;  Service: Cardiovascular;  Laterality: N/A;   SPINE SURGERY     TONSILLECTOMY      Allergies  Allergies  Allergen Reactions   Penicillins Other (See Comments)    Unknown-Reaction as child    Home Medications    Prior to Admission medications   Medication Sig Start Date End Date Taking? Authorizing Provider  acetaminophen  (TYLENOL ) 500 MG tablet Take 500 mg by mouth every 6 (six) hours as needed for moderate pain.    [provider]  amitriptyline  (ELAVIL ) 75 MG tablet Take 75 mg by mouth at bedtime.    [provider]  amLODipine  (NORVASC ) 10 MG tablet TAKE 1 TABLET BY MOUTH EVERY DAY 08/13/23   Lelon Hamilton T, PA-C  aspirin  81 MG EC tablet Take 1 tablet (81 mg total) by mouth daily. 12/20/20 08/03/23  Lelon Hamilton T, PA-C  clopidogrel  (PLAVIX ) 75 MG tablet TAKE 1 TABLET BY MOUTH EVERY DAY 08/27/23   Wonda Sharper, MD  Continuous Glucose  Sensor (FREESTYLE LIBRE 2 SENSOR) MISC  01/12/23   [provider]  ezetimibe  (ZETIA ) 10 MG tablet TAKE 1 TABLET BY MOUTH DAILY. 08/04/23   Wonda Sharper, MD  fenofibrate  54 MG tablet TAKE 1 TABLET BY MOUTH DAILY. PLEASE KEEP SCHEDULED APPOINTMENT FOR FUTURE REFILLS. THANK YOU. 08/16/23   Wonda Sharper, MD  furosemide  (LASIX ) 40 MG tablet Take 1 tablet (40 mg total) by mouth daily. 11/05/23   Wonda Sharper, MD  glucose monitoring kit (FREESTYLE) monitoring kit 1 each by Does not apply route as needed for other. Freestyle Libre    [provider]  isosorbide  mononitrate (IMDUR ) 60 MG 24 hr tablet TAKE 1 TABLET BY MOUTH EVERY DAY 08/10/23   Lelon Hamilton T, PA-C  metFORMIN (GLUCOPHAGE-XR) 500 MG 24 hr tablet Take 500 mg by mouth 2 (two) times daily.    [provider]  metoprolol  tartrate (LOPRESSOR ) 100 MG tablet Take 1 tablet (100 mg  total) by mouth 2 (two) times daily. 05/17/23   Lelon Hamilton T, PA-C  nitroGLYCERIN  (NITROSTAT ) 0.4 MG SL tablet PLACE 1 TABLET UNDER THE TONGUE EVERY 5 MINUTES AS NEEDED FOR CHEST PAIN 12/10/23   Wonda Sharper, MD  NON FORMULARY Inject 30 mLs into the skin every 30 (thirty) days. zilpibekimab    [provider]  NOVOLOG  MIX 70/30 FLEXPEN (70-30) 100 UNIT/ML Pen Inject 40-60 Units into the skin 3 (three) times daily. Per sliding scale 07/10/13   [provider]  rosuvastatin  (CRESTOR ) 5 MG tablet TAKE 1 TABLET (5 MG TOTAL) BY MOUTH DAILY. 11/05/23   Cooper, [name], MD  Semaglutide, 1 MG/DOSE, (OZEMPIC, 1 MG/DOSE,) 2 MG/1.5ML SOPN Inject 2 mg into the skin every Wednesday.    [provider]    Physical Exam    Vital Signs:  [name] does not have vital signs available for review today.  Given telephonic nature of communication, physical exam is limited. AAOx3. NAD. Normal affect.  Speech and respirations are unlabored.  Accessory Clinical Findings    None  Assessment & Plan    1.  Preoperative  Cardiovascular Risk Assessment: LUMBAR EPIDURAL    Date of Surgery:  Clearance TBD                                  Surgeon:  DR. SCOT FLATTEN Surgeon's Group or Practice Name:  [facility] Phone number:  [phone] ATTN: ANNITTA DARNEL, [place] Fax number:  [phone]      Primary Cardiologist: Sharper Wonda, MD  Chart reviewed as part of pre-operative protocol coverage. Given past medical history and time since last visit, based on ACC/AHA guidelines, Huntley Knoop would be at acceptable risk for the planned procedure without further cardiovascular testing.   Patient was advised that if he develops new symptoms prior to surgery to contact our office to arrange a follow-up appointment.  He verbalized understanding.  Hx of CABG, LHC in 2023 with patent grafts. May hold antiplatelets for spinal injection. Prefer to continue ASA, but defer to surgeon.   I will route this recommendation to the requesting party via Epic fax function and remove from pre-op pool.      Time:   Today, I have spent 7 minutes with the patient with telehealth technology discussing medical history, symptoms, and management plan.  I spent 10 minutes reviewing patient's past cardiac history and cardiac medications.    Josefa CHRISTELLA Beauvais, NP  01/13/2024, 7:00 AM

## 2024-01-25 DIAGNOSIS — E559 Vitamin D deficiency, unspecified: Secondary | ICD-10-CM | POA: Diagnosis not present

## 2024-01-25 DIAGNOSIS — E1165 Type 2 diabetes mellitus with hyperglycemia: Secondary | ICD-10-CM | POA: Diagnosis not present

## 2024-01-25 DIAGNOSIS — I10 Essential (primary) hypertension: Secondary | ICD-10-CM | POA: Diagnosis not present

## 2024-01-26 LAB — LAB REPORT - SCANNED
A1c: 8.1
Albumin, Urine POC: 5.9
Creatinine, POC: 38.3 mg/dL
EGFR: 54
Microalb Creat Ratio: 15

## 2024-02-03 ENCOUNTER — Ambulatory Visit: Payer: Self-pay | Admitting: Physician Assistant

## 2024-02-06 ENCOUNTER — Other Ambulatory Visit: Payer: Self-pay | Admitting: Physician Assistant

## 2024-04-10 ENCOUNTER — Other Ambulatory Visit (HOSPITAL_COMMUNITY): Payer: Self-pay | Admitting: Family Medicine

## 2024-04-10 DIAGNOSIS — Z006 Encounter for examination for normal comparison and control in clinical research program: Secondary | ICD-10-CM

## 2024-04-11 ENCOUNTER — Ambulatory Visit (HOSPITAL_COMMUNITY)
Admission: RE | Admit: 2024-04-11 | Discharge: 2024-04-11 | Disposition: A | Discharge status: Home / Self Care | Source: Ambulatory Visit | Attending: Family Medicine | Admitting: Family Medicine

## 2024-04-11 DIAGNOSIS — I77810 Thoracic aortic ectasia: Secondary | ICD-10-CM | POA: Insufficient documentation

## 2024-04-11 DIAGNOSIS — I119 Hypertensive heart disease without heart failure: Secondary | ICD-10-CM | POA: Insufficient documentation

## 2024-04-11 DIAGNOSIS — E119 Type 2 diabetes mellitus without complications: Secondary | ICD-10-CM | POA: Insufficient documentation

## 2024-04-11 DIAGNOSIS — E785 Hyperlipidemia, unspecified: Secondary | ICD-10-CM | POA: Insufficient documentation

## 2024-04-11 DIAGNOSIS — I350 Nonrheumatic aortic (valve) stenosis: Secondary | ICD-10-CM | POA: Insufficient documentation

## 2024-04-11 DIAGNOSIS — I358 Other nonrheumatic aortic valve disorders: Secondary | ICD-10-CM | POA: Insufficient documentation

## 2024-04-11 DIAGNOSIS — Z006 Encounter for examination for normal comparison and control in clinical research program: Secondary | ICD-10-CM | POA: Insufficient documentation

## 2024-04-11 LAB — ECHOCARDIOGRAM COMPLETE
AR max vel: 1.96 cm2
AV Area VTI: 1.9 cm2
AV Area mean vel: 1.88 cm2
AV Mean grad: 9 mmHg
AV Peak grad: 15.4 mmHg
Ao pk vel: 1.96 m/s
Area-P 1/2: 3.19 cm2
Calc EF: 62.4 %
S' Lateral: 3.5 cm
Single Plane A2C EF: 60 %
Single Plane A4C EF: 64.8 %

## 2024-07-31 ENCOUNTER — Ambulatory Visit: Admitting: Cardiovascular Disease
